# Patient Record
Sex: Female | Born: 1970
Health system: Southern US, Community
[De-identification: ages and names within clinical notes are randomized; demographics above are authoritative.]

## PROBLEM LIST (undated history)

## (undated) DIAGNOSIS — M1990 Unspecified osteoarthritis, unspecified site: Secondary | ICD-10-CM

## (undated) DIAGNOSIS — M2550 Pain in unspecified joint: Secondary | ICD-10-CM

## (undated) DIAGNOSIS — E559 Vitamin D deficiency, unspecified: Secondary | ICD-10-CM

## (undated) DIAGNOSIS — R7303 Prediabetes: Secondary | ICD-10-CM

## (undated) DIAGNOSIS — F419 Anxiety disorder, unspecified: Secondary | ICD-10-CM

## (undated) DIAGNOSIS — N200 Calculus of kidney: Secondary | ICD-10-CM

## (undated) DIAGNOSIS — I10 Essential (primary) hypertension: Secondary | ICD-10-CM

## (undated) DIAGNOSIS — D649 Anemia, unspecified: Secondary | ICD-10-CM

## (undated) DIAGNOSIS — R3129 Other microscopic hematuria: Secondary | ICD-10-CM

## (undated) HISTORY — DX: Anemia, unspecified: D64.9

## (undated) HISTORY — DX: Pain in unspecified joint: M25.50

## (undated) HISTORY — DX: Prediabetes: R73.03

## (undated) HISTORY — DX: Calculus of kidney: N20.0

## (undated) HISTORY — DX: Other microscopic hematuria: R31.29

## (undated) HISTORY — DX: Essential (primary) hypertension: I10

## (undated) HISTORY — PX: NO PAST SURGERIES: SHX2092

## (undated) HISTORY — DX: Vitamin D deficiency, unspecified: E55.9

## (undated) HISTORY — DX: Anxiety disorder, unspecified: F41.9

## (undated) HISTORY — DX: Unspecified osteoarthritis, unspecified site: M19.90

---

## 2005-09-09 ENCOUNTER — Ambulatory Visit: Payer: Self-pay | Admitting: Internal Medicine

## 2005-09-15 ENCOUNTER — Ambulatory Visit: Payer: Self-pay | Admitting: Internal Medicine

## 2005-09-16 ENCOUNTER — Ambulatory Visit: Payer: Self-pay | Admitting: Cardiology

## 2006-10-02 ENCOUNTER — Emergency Department (HOSPITAL_COMMUNITY): Admission: EM | Admit: 2006-10-02 | Discharge: 2006-10-02 | Payer: Self-pay | Admitting: Family Medicine

## 2006-12-18 ENCOUNTER — Inpatient Hospital Stay (HOSPITAL_COMMUNITY): Admission: AD | Admit: 2006-12-18 | Discharge: 2006-12-20 | Payer: Self-pay | Admitting: Obstetrics and Gynecology

## 2007-01-07 ENCOUNTER — Encounter: Admission: RE | Admit: 2007-01-07 | Discharge: 2007-02-04 | Payer: Self-pay | Admitting: Obstetrics and Gynecology

## 2007-02-05 ENCOUNTER — Encounter: Admission: RE | Admit: 2007-02-05 | Discharge: 2007-03-07 | Payer: Self-pay | Admitting: Obstetrics and Gynecology

## 2007-11-14 ENCOUNTER — Ambulatory Visit: Payer: Self-pay | Admitting: Internal Medicine

## 2007-11-14 LAB — CONVERTED CEMR LAB
ALT: 22 units/L (ref 0–35)
AST: 23 units/L (ref 0–37)
Albumin: 3.6 g/dL (ref 3.5–5.2)
Alkaline Phosphatase: 75 units/L (ref 39–117)
BUN: 8 mg/dL (ref 6–23)
Basophils Absolute: 0 10*3/uL (ref 0.0–0.1)
Bilirubin Urine: NEGATIVE
Bilirubin, Direct: 0.1 mg/dL (ref 0.0–0.3)
Calcium: 8.8 mg/dL (ref 8.4–10.5)
Chloride: 106 meq/L (ref 96–112)
Cholesterol: 157 mg/dL (ref 0–200)
Creatinine, Ser: 0.9 mg/dL (ref 0.4–1.2)
Crystals: NEGATIVE
Eosinophils Absolute: 0.1 10*3/uL (ref 0.0–0.6)
GFR calc Af Amer: 92 mL/min
GFR calc non Af Amer: 76 mL/min
Glucose, Bld: 92 mg/dL (ref 70–99)
HCT: 40.7 % (ref 36.0–46.0)
HDL: 43.2 mg/dL (ref >39.0)
Ketones, ur: NEGATIVE mg/dL
LDL Cholesterol: 102 mg/dL — ABNORMAL HIGH (ref 0–99)
Leukocytes, UA: NEGATIVE
Lymphocytes Relative: 32.1 % (ref 12.0–46.0)
MCHC: 34.5 g/dL (ref 30.0–36.0)
MCV: 92.7 fL (ref 78.0–100.0)
Monocytes Absolute: 0.4 10*3/uL (ref 0.2–0.7)
Monocytes Relative: 5.4 % (ref 3.0–11.0)
Neutro Abs: 4.9 10*3/uL (ref 1.4–7.7)
Neutrophils Relative %: 61 % (ref 43.0–77.0)
Platelets: 292 10*3/uL (ref 150–400)
Potassium: 3.6 meq/L (ref 3.5–5.1)
RBC: 4.39 M/uL (ref 3.87–5.11)
RDW: 12.5 % (ref 11.5–14.6)
Specific Gravity, Urine: >=1.03 (ref 1.000–1.03)
Total CHOL/HDL Ratio: 3.6
Triglycerides: 59 mg/dL (ref 0–149)
Urine Glucose: NEGATIVE mg/dL
Urobilinogen, UA: 0.2 (ref 0.0–1.0)
VLDL: 12 mg/dL (ref 0–40)
WBC: 7.9 10*3/uL (ref 4.5–10.5)
pH: 5.5 (ref 5.0–8.0)

## 2007-11-16 ENCOUNTER — Ambulatory Visit: Payer: Self-pay | Admitting: Internal Medicine

## 2009-01-23 ENCOUNTER — Telehealth (INDEPENDENT_AMBULATORY_CARE_PROVIDER_SITE_OTHER): Payer: Self-pay | Admitting: *Deleted

## 2009-01-23 ENCOUNTER — Encounter (INDEPENDENT_AMBULATORY_CARE_PROVIDER_SITE_OTHER): Payer: Self-pay | Admitting: *Deleted

## 2009-02-15 ENCOUNTER — Ambulatory Visit: Payer: Self-pay | Admitting: Internal Medicine

## 2009-02-15 LAB — CONVERTED CEMR LAB
ALT: 28 units/L (ref 0–35)
AST: 22 units/L (ref 0–37)
Albumin: 3.7 g/dL (ref 3.5–5.2)
Alkaline Phosphatase: 64 units/L (ref 39–117)
BUN: 11 mg/dL (ref 6–23)
Basophils Absolute: 0 10*3/uL (ref 0.0–0.1)
Basophils Relative: 0.4 % (ref 0.0–3.0)
Bilirubin Urine: NEGATIVE
Bilirubin, Direct: 0.1 mg/dL (ref 0.0–0.3)
CO2: 24 meq/L (ref 19–32)
Calcium: 9.1 mg/dL (ref 8.4–10.5)
Chloride: 109 meq/L (ref 96–112)
Cholesterol: 186 mg/dL (ref 0–200)
Creatinine, Ser: 0.8 mg/dL (ref 0.4–1.2)
Crystals: NEGATIVE
Eosinophils Absolute: 0.1 10*3/uL (ref 0.0–0.7)
Eosinophils Relative: 1.3 % (ref 0.0–5.0)
GFR calc Af Amer: 104 mL/min
GFR calc non Af Amer: 86 mL/min
Glucose, Bld: 93 mg/dL (ref 70–99)
HCT: 41.8 % (ref 36.0–46.0)
HDL: 54 mg/dL (ref >39.0)
Hemoglobin: 14.2 g/dL (ref 12.0–15.0)
Ketones, ur: NEGATIVE mg/dL
LDL Cholesterol: 119 mg/dL — ABNORMAL HIGH (ref 0–99)
Leukocytes, UA: NEGATIVE
Lymphocytes Relative: 34.1 % (ref 12.0–46.0)
MCHC: 33.9 g/dL (ref 30.0–36.0)
MCV: 94.2 fL (ref 78.0–100.0)
Monocytes Absolute: 0.4 10*3/uL (ref 0.1–1.0)
Monocytes Relative: 5.4 % (ref 3.0–12.0)
Neutro Abs: 4.3 10*3/uL (ref 1.4–7.7)
Neutrophils Relative %: 58.8 % (ref 43.0–77.0)
Nitrite: NEGATIVE
Pap Smear: NORMAL
Platelets: 239 10*3/uL (ref 150–400)
Potassium: 4.1 meq/L (ref 3.5–5.1)
RBC: 4.43 M/uL (ref 3.87–5.11)
RDW: 13.1 % (ref 11.5–14.6)
Sodium: 140 meq/L (ref 135–145)
Specific Gravity, Urine: 1.025 (ref 1.000–1.035)
TSH: 0.71 microintl units/mL (ref 0.35–5.50)
Total Bilirubin: 0.8 mg/dL (ref 0.3–1.2)
Total CHOL/HDL Ratio: 3.4
Total Protein, Urine: NEGATIVE mg/dL
Total Protein: 6.9 g/dL (ref 6.0–8.3)
Triglycerides: 66 mg/dL (ref 0–149)
Urine Glucose: NEGATIVE mg/dL
Urobilinogen, UA: 0.2 (ref 0.0–1.0)
VLDL: 13 mg/dL (ref 0–40)
Vit D, 25-Hydroxy: 13 ng/mL — ABNORMAL LOW (ref 30–89)
WBC: 7.3 10*3/uL (ref 4.5–10.5)
pH: 5.5 (ref 5.0–8.0)

## 2009-02-20 ENCOUNTER — Ambulatory Visit: Payer: Self-pay | Admitting: Internal Medicine

## 2009-02-20 DIAGNOSIS — E559 Vitamin D deficiency, unspecified: Secondary | ICD-10-CM

## 2009-06-11 ENCOUNTER — Telehealth: Payer: Self-pay | Admitting: Internal Medicine

## 2009-06-14 ENCOUNTER — Telehealth: Payer: Self-pay | Admitting: Internal Medicine

## 2009-07-15 ENCOUNTER — Ambulatory Visit: Payer: Self-pay | Admitting: Internal Medicine

## 2009-07-15 LAB — CONVERTED CEMR LAB: Vit D, 25-Hydroxy: 36 ng/mL (ref 30–89)

## 2009-07-16 ENCOUNTER — Telehealth: Payer: Self-pay | Admitting: Internal Medicine

## 2009-09-02 ENCOUNTER — Ambulatory Visit: Payer: Self-pay | Admitting: Internal Medicine

## 2009-09-02 DIAGNOSIS — R3129 Other microscopic hematuria: Secondary | ICD-10-CM

## 2009-09-02 DIAGNOSIS — F4321 Adjustment disorder with depressed mood: Secondary | ICD-10-CM | POA: Insufficient documentation

## 2009-09-02 LAB — CONVERTED CEMR LAB
Albumin ELP: 55.7 % — ABNORMAL LOW (ref 55.8–66.1)
Albumin, U: DETECTED %
Alpha 1, Urine: DETECTED % — AB
Alpha 2, Urine: DETECTED % — AB
Alpha-1-Globulin: 5.5 % — ABNORMAL HIGH (ref 2.9–4.9)
Beta Globulin: 7.1 % (ref 4.7–7.2)
Beta, Urine: DETECTED % — AB
Free Lambda Lt Chains,Ur: 0.33 mg/dL (ref 0.08–1.01)
Gamma Globulin, Urine: DETECTED % — AB
Gamma Globulin: 15 % (ref 11.1–18.8)
Total Protein, Serum Electrophoresis: 7 g/dL (ref 6.0–8.3)

## 2009-09-05 ENCOUNTER — Encounter: Payer: Self-pay | Admitting: Internal Medicine

## 2009-09-05 ENCOUNTER — Telehealth: Payer: Self-pay | Admitting: Internal Medicine

## 2009-09-05 LAB — CONVERTED CEMR LAB
IgA: 214 mg/dL (ref 68–378)
IgG (Immunoglobin G), Serum: 1150 mg/dL (ref 694–1618)
IgM, Serum: 53 mg/dL — ABNORMAL LOW (ref 60–263)
Total Protein, Serum Electrophoresis: 7 g/dL (ref 6.0–8.3)

## 2009-09-09 ENCOUNTER — Telehealth: Payer: Self-pay | Admitting: Internal Medicine

## 2010-08-07 ENCOUNTER — Telehealth: Payer: Self-pay | Admitting: Internal Medicine

## 2010-08-26 ENCOUNTER — Ambulatory Visit: Payer: Self-pay | Admitting: Internal Medicine

## 2010-08-26 LAB — CONVERTED CEMR LAB
BUN: 14 mg/dL (ref 6–23)
Basophils Absolute: 0 10*3/uL (ref 0.0–0.1)
Basophils Relative: 0.5 % (ref 0.0–3.0)
CO2: 25 meq/L (ref 19–32)
Calcium: 9.8 mg/dL (ref 8.4–10.5)
Chloride: 108 meq/L (ref 96–112)
Creatinine, Ser: 0.8 mg/dL (ref 0.4–1.2)
Eosinophils Absolute: 0.2 10*3/uL (ref 0.0–0.7)
Eosinophils Relative: 2.8 % (ref 0.0–5.0)
GFR calc non Af Amer: 97.61 mL/min (ref >60)
Glucose, Bld: 97 mg/dL (ref 70–99)
HCT: 41.1 % (ref 36.0–46.0)
Hemoglobin: 13.8 g/dL (ref 12.0–15.0)
Lymphocytes Relative: 30.9 % (ref 12.0–46.0)
Lymphs Abs: 2.3 10*3/uL (ref 0.7–4.0)
MCHC: 33.7 g/dL (ref 30.0–36.0)
Monocytes Absolute: 0.6 10*3/uL (ref 0.1–1.0)
Monocytes Relative: 8.5 % (ref 3.0–12.0)
Neutro Abs: 4.2 10*3/uL (ref 1.4–7.7)
Neutrophils Relative %: 57.3 % (ref 43.0–77.0)
Platelets: 270 10*3/uL (ref 150.0–400.0)
Potassium: 5.3 meq/L — ABNORMAL HIGH (ref 3.5–5.1)
RBC: 4.31 M/uL (ref 3.87–5.11)
RDW: 13.8 % (ref 11.5–14.6)
Sodium: 139 meq/L (ref 135–145)
TSH: 0.48 microintl units/mL (ref 0.35–5.50)

## 2010-09-04 ENCOUNTER — Ambulatory Visit: Payer: Self-pay | Admitting: Internal Medicine

## 2011-01-06 NOTE — Progress Notes (Signed)
Summary: Needs lab order  Phone Note Call from Patient Call back at [phone] Can leave message on machine   Caller: Patient Call For: D. Thomos Lemons DO Summary of Call: Pt called stating she has CPX scheduled on 08/29/10. Wants lab order sent to [facility]  Please advise what labs to order? Nicki Guadalajara Fergerson CMA Duncan Dull)  August 07, 2010 10:19 AM   Follow-up for Phone Call        CBC TSH BMET  - use v70 Follow-up by: D. Thomos Lemons DO,  August 07, 2010 5:33 PM  Additional Follow-up for Phone Call Additional follow up Details #1::        lab orders have been enetered for 08/20/10 Additional Follow-up by: Glendell Docker CMA,  August 08, 2010 8:22 AM

## 2011-01-06 NOTE — Assessment & Plan Note (Signed)
Summary: CPX/MHF   Vital Signs:  Patient profile:   40 year old female Height:      64 inches Weight:      174.75 pounds BMI:     30.10 O2 Sat:      98 % on Room air Temp:     98.4 degrees F oral Pulse rate:   77 / minute Pulse rhythm:   regular Resp:     16 per minute BP sitting:   100 / 74  (right arm) Cuff size:   large  Vitals Entered By: Glendell Docker CMA (September 04, 2010 8:38 AM)  O2 Flow:  Room air CC: CPX Is Patient Diabetic? No Pain Assessment Patient in pain? no        Primary Care Provider:  Dondra Spry DO  CC:  CPX.  History of Present Illness: 40 y/o AA female for routine cpx no sing int med hx staying busy still finishing nursing school - May, 2015 doesn't always eat healthy  lab results reviwed with pt  Preventive Screening-Counseling & Management  Alcohol-Tobacco     Alcohol drinks/day: 0     Smoking Status: never  Caffeine-Diet-Exercise     Caffeine use/day: 3 beverages daily     Does Patient Exercise: yes  Allergies (verified): No Known Drug Allergies  Past History:  Past Medical History: Microscopic Hematuria      Past Surgical History: Denies surgical history     Family History: Mother died from complications of multiple myeloma Father healthy    Social History: 5 children Never Smoked [facility] Alcohol use-no  Married - Editor, commissioning use/day:  3 beverages daily  Review of Systems  The patient denies weight loss, weight gain, chest pain, syncope, dyspnea on exertion, prolonged cough, abdominal pain, melena, hematochezia, severe indigestion/heartburn, and depression.    Physical Exam  General:  alert, well-developed, and well-nourished.   Head:  normocephalic and atraumatic.   Eyes:  pupils equal, pupils round, and pupils reactive to light.   Ears:  R ear normal and L ear normal.   Mouth:  pharynx pink and moist.   Neck:  supple, full ROM, no masses, and no neck tenderness.     Lungs:  normal respiratory effort and normal breath sounds.   Heart:  normal rate, regular rhythm, no murmur, and no gallop.   Abdomen:  soft, non-tender, and normal bowel sounds.   Extremities:  No lower extremity edema Neurologic:  cranial nerves II-XII intact and gait normal.   Psych:  normally interactive, good eye contact, not anxious appearing, and not depressed appearing.     Impression & Recommendations:  Problem # 1:  ROUTINE GENERAL MEDICAL [email or web address]@HEALTH  CARE FACL (ICD-V70.0) Reviewed adult health maintenance protocols. Pt counseled on diet and exercise.  Pap smear: normal (02/17/2010) Td Booster: Td (12/24/2003)   Chol: 186 (02/15/2009)   HDL: 54.0 (02/15/2009)   LDL: 119 (02/15/2009)   TG: 66 (02/15/2009) TSH: 0.48 (08/26/2010)     Complete Medication List: 1)  Estrostep Fe 1-20/1-30/1-35 Mg-mcg Tabs (Norethindron-ethinyl estrad-fe) .... Take 1 tablet by mouth once a day 2)  Vitamin D3 2000 Unit Caps (Cholecalciferol) .... One by mouth qd  Patient Instructions: 1)  Please schedule a follow-up appointment in 1 year  Current Allergies (reviewed today): No known allergies      Preventive Care Screening  Pap Smear:    Date:  02/17/2010    Results:  normal

## 2011-04-24 NOTE — Assessment & Plan Note (Signed)
[facility]                           PRIMARY CARE OFFICE NOTE   MATSUE, STROM                      MRN:          [id number]  DATE:04/22/2007                            DOB:          12-15-1970    PROCEDURE:  Skin tag removal.   INDICATION:  Three large skin tags on the neck.   Risks and benefits explained to the patient in detail, she agreed to  proceed.   The skin was prepped with dye and alcohol.  Each injected with 4 to 5 cc  of 2% lidocaine with epinephrine.  Removed with a sterile blade.  The  base of skin tags was treated with hyfrecator, tolerated well.  Complications none.  Band-Aid with antibiotic ointment.  Wound  instructions provided.  Performed at no charge.     Georgina Quint. Plotnikov, MD  Electronically Signed    AVP/MedQ  DD: 04/22/2007  DT: 04/22/2007  Job #: [id number]

## 2011-05-11 ENCOUNTER — Telehealth: Payer: Self-pay | Admitting: Internal Medicine

## 2011-05-11 NOTE — Telephone Encounter (Signed)
Opened in error

## 2011-09-08 ENCOUNTER — Telehealth: Payer: Self-pay | Admitting: Internal Medicine

## 2011-09-08 DIAGNOSIS — Z0000 Encounter for general adult medical examination without abnormal findings: Secondary | ICD-10-CM

## 2011-09-08 NOTE — Telephone Encounter (Signed)
Labs entered for the week of October 8 th 2012.

## 2011-09-08 NOTE — Telephone Encounter (Signed)
Patient has cpe appt on 09/23/11. Please order labs for one week prior to visit. Patient will be going to [facility] for labs. Also, patient would like you to call her, to let her know that labs have been ordered and you may leave a detailed message on phone(per patient)

## 2011-09-09 NOTE — Telephone Encounter (Signed)
Call returned to patient at [phone], no answer. A detailed voice message was left informing patient of lab orders for Yvonne [name].

## 2011-09-14 ENCOUNTER — Other Ambulatory Visit (INDEPENDENT_AMBULATORY_CARE_PROVIDER_SITE_OTHER): Payer: Self-pay

## 2011-09-14 DIAGNOSIS — Z0000 Encounter for general adult medical examination without abnormal findings: Secondary | ICD-10-CM

## 2011-09-14 LAB — BASIC METABOLIC PANEL
Calcium: 8.6 mg/dL (ref 8.4–10.5)
Chloride: 109 mEq/L (ref 96–112)
Creatinine, Ser: 0.9 mg/dL (ref 0.4–1.2)
Sodium: 140 mEq/L (ref 135–145)

## 2011-09-14 LAB — CBC WITH DIFFERENTIAL/PLATELET
Basophils Absolute: 0 10*3/uL (ref 0.0–0.1)
Eosinophils Absolute: 0.1 10*3/uL (ref 0.0–0.7)
Hemoglobin: 13.4 g/dL (ref 12.0–15.0)
Lymphocytes Relative: 30.8 % (ref 12.0–46.0)
Lymphs Abs: 2.7 10*3/uL (ref 0.7–4.0)
MCHC: 32.8 g/dL (ref 30.0–36.0)
Neutro Abs: 5.3 10*3/uL (ref 1.4–7.7)
Platelets: 292 10*3/uL (ref 150.0–400.0)
RDW: 14 % (ref 11.5–14.6)

## 2011-09-14 LAB — URINALYSIS, ROUTINE W REFLEX MICROSCOPIC
Bilirubin Urine: NEGATIVE
Ketones, ur: NEGATIVE
Total Protein, Urine: NEGATIVE
Urine Glucose: NEGATIVE

## 2011-09-14 LAB — HEPATIC FUNCTION PANEL
ALT: 55 U/L — ABNORMAL HIGH (ref 0–35)
AST: 27 U/L (ref 0–37)
Bilirubin, Direct: 0.1 mg/dL (ref 0.0–0.3)
Total Bilirubin: 0.6 mg/dL (ref 0.3–1.2)

## 2011-09-14 LAB — HEMOGLOBIN A1C: Hgb A1c MFr Bld: 5.7 % (ref 4.6–6.5)

## 2011-09-14 LAB — LIPID PANEL: Cholesterol: 172 mg/dL (ref 0–200)

## 2011-09-16 ENCOUNTER — Encounter: Payer: Self-pay | Admitting: Internal Medicine

## 2011-09-23 ENCOUNTER — Ambulatory Visit (INDEPENDENT_AMBULATORY_CARE_PROVIDER_SITE_OTHER): Payer: BC Managed Care – PPO | Admitting: Internal Medicine

## 2011-09-23 ENCOUNTER — Telehealth: Payer: Self-pay | Admitting: Internal Medicine

## 2011-09-23 ENCOUNTER — Encounter: Payer: Self-pay | Admitting: Internal Medicine

## 2011-09-23 VITALS — BP 102/70 | HR 93 | Temp 98.4°F | Resp 18 | Ht 64.0 in | Wt 184.0 lb

## 2011-09-23 DIAGNOSIS — Z0000 Encounter for general adult medical examination without abnormal findings: Secondary | ICD-10-CM

## 2011-09-23 DIAGNOSIS — R748 Abnormal levels of other serum enzymes: Secondary | ICD-10-CM

## 2011-09-23 DIAGNOSIS — E559 Vitamin D deficiency, unspecified: Secondary | ICD-10-CM

## 2011-09-23 LAB — HEPATIC FUNCTION PANEL
AST: 13 U/L (ref 0–37)
Albumin: 4.3 g/dL (ref 3.5–5.2)
Alkaline Phosphatase: 67 U/L (ref 39–117)
Bilirubin, Direct: 0.1 mg/dL (ref 0.0–0.3)
Total Bilirubin: 0.3 mg/dL (ref 0.3–1.2)

## 2011-09-23 LAB — VITAMIN D 25 HYDROXY (VIT D DEFICIENCY, FRACTURES): Vit D, 25-Hydroxy: 24 ng/mL — ABNORMAL LOW (ref 30–89)

## 2011-09-23 NOTE — Patient Instructions (Signed)
Please schedule fasting labs before next year's physical: cbc, chem7, a1c, lipid, lft, tsh, and urinalysis with micro (v70.0)

## 2011-09-23 NOTE — Telephone Encounter (Signed)
Lab orders entered for Yvonne [name] for October 2013.

## 2011-09-27 DIAGNOSIS — R748 Abnormal levels of other serum enzymes: Secondary | ICD-10-CM | POA: Insufficient documentation

## 2011-09-27 DIAGNOSIS — Z0000 Encounter for general adult medical examination without abnormal findings: Secondary | ICD-10-CM | POA: Insufficient documentation

## 2011-09-27 NOTE — Progress Notes (Signed)
  Subjective:    Patient ID: Yvonne [name], female    DOB: December 14, 1970, 40 y.o.   MRN: [id number]  HPI Pt presents to clnic for annual exam. Sees gyn for pap smears reported utd and nl. Reviewed cpe labs with single minimal elevation of alt 55. No known liver disease. No excessive etoh or tylenol use. asx without abd pain, n/v. No active complaint.  Past Medical History  Diagnosis Date  . Microscopic hematuria    Past Surgical History  Procedure Date  . No past surgeries     Denies surgical history    reports that she has never smoked. She has never used smokeless tobacco. She reports that she does not drink alcohol or use illicit drugs. family history includes Healthy in her father and Other in her mother. No Known Allergies     Review of Systems see hpi     Objective:   Physical Exam  Nursing note and vitals reviewed. Constitutional: She appears well-developed and well-nourished. No distress.  HENT:  Head: Normocephalic and atraumatic.  Right Ear: Tympanic membrane, external ear and ear canal normal.  Left Ear: Tympanic membrane, external ear and ear canal normal.  Nose: Nose normal.  Mouth/Throat: Oropharynx is clear and moist. No oropharyngeal exudate.  Eyes: Conjunctivae and EOM are normal. Pupils are equal, round, and reactive to light. Right eye exhibits no discharge. Left eye exhibits no discharge. No scleral icterus.  Neck: Neck supple. No JVD present. No thyromegaly present.  Cardiovascular: Normal rate, regular rhythm and normal heart sounds.  Exam reveals no gallop and no friction rub.   No murmur heard. Pulmonary/Chest: Effort normal and breath sounds normal. No respiratory distress. She has no wheezes. She has no rales.  Abdominal: Soft. Bowel sounds are normal. She exhibits no distension and no mass. There is no hepatosplenomegaly. There is no tenderness. There is no rebound and no guarding.  Lymphadenopathy:    She has no cervical adenopathy.  Neurological:  She is alert.  Skin: Skin is warm and dry. No rash noted. She is not diaphoretic. No erythema.  Psychiatric: She has a normal mood and affect.          Assessment & Plan:

## 2011-09-27 NOTE — Assessment & Plan Note (Signed)
Obtain vit d level 

## 2011-09-27 NOTE — Assessment & Plan Note (Signed)
Nl exam. Pap smears per gyn

## 2011-09-27 NOTE — Assessment & Plan Note (Signed)
Repeat lft

## 2011-09-28 ENCOUNTER — Other Ambulatory Visit: Payer: Self-pay | Admitting: Internal Medicine

## 2011-09-28 DIAGNOSIS — E559 Vitamin D deficiency, unspecified: Secondary | ICD-10-CM

## 2011-10-26 ENCOUNTER — Telehealth: Payer: Self-pay | Admitting: Internal Medicine

## 2011-10-26 NOTE — Telephone Encounter (Signed)
Patient states that she wants to go to the [place] lab for Vit-D in mid-December.

## 2011-10-27 NOTE — Telephone Encounter (Signed)
Lab order entered for December 2012. 

## 2011-12-03 ENCOUNTER — Other Ambulatory Visit (INDEPENDENT_AMBULATORY_CARE_PROVIDER_SITE_OTHER): Payer: BC Managed Care – PPO

## 2011-12-03 DIAGNOSIS — E559 Vitamin D deficiency, unspecified: Secondary | ICD-10-CM

## 2011-12-03 DIAGNOSIS — Z0000 Encounter for general adult medical examination without abnormal findings: Secondary | ICD-10-CM

## 2011-12-03 LAB — URINALYSIS, ROUTINE W REFLEX MICROSCOPIC
Bilirubin Urine: NEGATIVE
Total Protein, Urine: 30
Urine Glucose: NEGATIVE
Urobilinogen, UA: 0.2 (ref 0.0–1.0)

## 2012-03-03 ENCOUNTER — Encounter: Payer: Self-pay | Admitting: Internal Medicine

## 2012-03-03 ENCOUNTER — Ambulatory Visit (INDEPENDENT_AMBULATORY_CARE_PROVIDER_SITE_OTHER): Payer: 59 | Admitting: Internal Medicine

## 2012-03-03 ENCOUNTER — Ambulatory Visit (HOSPITAL_BASED_OUTPATIENT_CLINIC_OR_DEPARTMENT_OTHER)
Admission: RE | Admit: 2012-03-03 | Discharge: 2012-03-03 | Disposition: A | Discharge status: Home / Self Care | Payer: 59 | Source: Ambulatory Visit | Attending: Internal Medicine | Admitting: Internal Medicine

## 2012-03-03 VITALS — BP 118/80 | HR 86 | Temp 98.1°F | Resp 20 | Wt 189.0 lb

## 2012-03-03 DIAGNOSIS — R0989 Other specified symptoms and signs involving the circulatory and respiratory systems: Secondary | ICD-10-CM

## 2012-03-03 DIAGNOSIS — R0683 Snoring: Secondary | ICD-10-CM

## 2012-03-03 DIAGNOSIS — M25569 Pain in unspecified knee: Secondary | ICD-10-CM

## 2012-03-03 DIAGNOSIS — R0609 Other forms of dyspnea: Secondary | ICD-10-CM

## 2012-03-03 MED ORDER — DICLOFENAC SODIUM 75 MG PO TBEC: DELAYED_RELEASE_TABLET | ORAL | Status: DC

## 2012-03-03 MED ORDER — DICLOFENAC SODIUM 75 MG PO TBEC: 75.0000 mg | DELAYED_RELEASE_TABLET | Freq: Two times a day (BID) | ORAL | Status: DC

## 2012-03-06 DIAGNOSIS — R0683 Snoring: Secondary | ICD-10-CM | POA: Insufficient documentation

## 2012-03-06 DIAGNOSIS — M25569 Pain in unspecified knee: Secondary | ICD-10-CM | POA: Insufficient documentation

## 2012-03-06 NOTE — Assessment & Plan Note (Signed)
Pulmonary consult

## 2012-03-06 NOTE — Assessment & Plan Note (Signed)
Attempt voltaren with food and no other nsaids. Obtain plain xray. Consider sports medicine consult if fails to improve.

## 2012-03-06 NOTE — Progress Notes (Signed)
  Subjective:    Patient ID: Yvonne [name], female    DOB: [birth year], 41 y.o.   MRN: [id number]  HPI Pt presents to clinic for evaluation of knee pain. Notes two week h/o right knee pain without injury. No instability or falls. Attempted motrin 600mg  bid. Pain worse in the am. No other alleviating or exacerbating factors. Notes snoring, daytime fatigue and husband has witnessed apnea. Has no been evaluated for osa.  Past Medical History  Diagnosis Date  . Microscopic hematuria    Past Surgical History  Procedure Date  . No past surgeries     Denies surgical history    reports that she has never smoked. She has never used smokeless tobacco. She reports that she does not drink alcohol or use illicit drugs. family history includes Healthy in her father and Other in her mother. No Known Allergies   Review of Systems see hpi     Objective:   Physical Exam  Nursing note and vitals reviewed. Constitutional: She appears well-developed and well-nourished. No distress.  HENT:  Head: Normocephalic and atraumatic.  Musculoskeletal:       Right knee-no erythema, warmth, or effusion. FROM. Gait nl. Ant drawers neg.  Neurological: She is alert.  Skin: Skin is warm and dry. She is not diaphoretic.  Psychiatric: She has a normal mood and affect.          Assessment & Plan:

## 2012-04-19 ENCOUNTER — Ambulatory Visit (INDEPENDENT_AMBULATORY_CARE_PROVIDER_SITE_OTHER): Payer: 59 | Admitting: Internal Medicine

## 2012-04-19 ENCOUNTER — Encounter: Payer: Self-pay | Admitting: Internal Medicine

## 2012-04-19 ENCOUNTER — Institutional Professional Consult (permissible substitution): Payer: 59 | Admitting: Internal Medicine

## 2012-04-19 VITALS — BP 120/76 | HR 97 | Ht 64.0 in | Wt 190.2 lb

## 2012-04-19 DIAGNOSIS — R0683 Snoring: Secondary | ICD-10-CM

## 2012-04-19 DIAGNOSIS — J309 Allergic rhinitis, unspecified: Secondary | ICD-10-CM

## 2012-04-19 DIAGNOSIS — J302 Other seasonal allergic rhinitis: Secondary | ICD-10-CM

## 2012-04-19 DIAGNOSIS — R0609 Other forms of dyspnea: Secondary | ICD-10-CM

## 2012-04-19 DIAGNOSIS — G4733 Obstructive sleep apnea (adult) (pediatric): Secondary | ICD-10-CM

## 2012-04-19 NOTE — Patient Instructions (Signed)
Order- Schedule split protocol NPSG   Dx OSA  

## 2012-04-19 NOTE — Progress Notes (Signed)
04/19/12- 40 yoFnever smoker seen on referral by Dr  Rodena Medin  for evaluation of sleep concerns. Husband Minerva Areola is followed for chronic lung disease. She complains of snoring, choking in sleep, unable to sleep for long periods of time and extremely tired during the daytime. Bedtime 9 to 10 PM, sleep latency 10-20 minutes, waking 3 times before up at 5:30 AM. Daytime sleepiness anytime if she is quiet. Naps do help. History of perennial and seasonal allergic rhinitis not helped by OTC allergy meds. No ENT surgery. No history of thyroid or lung disease. 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 Never smoked. Family history negative for sleep apnea. Not aware of reflux or heartburn.  Prior to Admission medications   Medication Sig Start Date End Date Taking? Authorizing Provider  Cholecalciferol (VITAMIN D3) 2000 UNITS TABS Take 2,000 Units by mouth daily.     Yes Historical Provider, MD  diclofenac (VOLTAREN) 75 MG EC tablet One twice a day for 5 days then one twice a day as needed for knee pain (take with food) 03/03/12  Yes Edwyna Perfect, MD  norethindrone-ethinyl estradiol (MICROGESTIN,JUNEL,LOESTRIN) 1-20 MG-MCG tablet Take 1 tablet by mouth daily.   Yes Historical Provider, MD   Past Medical History  Diagnosis Date  . Microscopic hematuria    Past Surgical History  Procedure Date  . No past surgeries     Denies surgical history   Family History  Problem Relation Age of Onset  . Healthy Father   . Other Mother     deceased from multiple myelomas   History   Social History  . Marital Status: Married    Spouse Name: N/A    Number of Children: N/A  . Years of Education: N/A   Occupational History  . Not on file.   Social History Main Topics  . Smoking status: Never Smoker   . Smokeless tobacco: Never Used  . Alcohol Use: No  . Drug Use: No  . Sexually Active: Not on file   Other Topics Concern  . Not on file   Social History Narrative   5 childrenMarried to [employer] [facility]   ROS-see HPI Constitutional:   No-   weight loss, night sweats, fevers, chills, fatigue, lassitude. HEENT:   No-  headaches, difficulty swallowing, tooth/dental problems, sore throat,    + sneezing, itching, ear ache, nasal congestion, post nasal drip,  CV:  No-   chest pain, orthopnea, PND, swelling in lower extremities, anasarca, dizziness, palpitations Resp: No-   shortness of breath with exertion or at rest.              No-   productive cough,  No non-productive cough,  No- coughing up of blood.              No-   change in color of mucus.  No- wheezing.   Skin: No-   rash or lesions. GI:  No-   heartburn, indigestion, abdominal pain, nausea, vomiting, diarrhea,                 change in bowel habits, loss of appetite GU: No-   dysuria, change in color of urine, no urgency or frequency.  No- flank pain. MS:  +joint pain or swelling (knee).  No- decreased range of motion.  No- back pain. Neuro-     nothing unusual Psych:  No- change in mood or affect. No depression or anxiety.  No memory loss.  OBJ- Physical Exam General- Alert,  Oriented, Affect-appropriate, Distress- none acute Skin- rash-none, lesions- none, excoriation- none Lymphadenopathy- none Head- atraumatic            Eyes- Gross vision intact, PERRLA, conjunctivae and secretions clear            Ears- Hearing, canals-normal            Nose- +pale turbinate edema, no-Septal dev, mucus, polyps, erosion, perforation. + "shiners"            Throat- Mallampati III , mucosa clear , drainage- none, tonsils- atrophic Neck- flexible , trachea midline, no stridor , thyroid ? enlarged, carotid no bruit Chest - symmetrical excursion , unlabored           Heart/CV- RRR , no murmur , no gallop  , no rub, nl s1 s2                           - JVD- none , edema- none, stasis changes- none, varices- none           Lung- clear to P&A, wheeze- none, cough- none , dullness-none, rub- none            Chest wall-  Abd- tender-no, distended-no, bowel sounds-present, HSM- no Br/ Gen/ Rectal- Not done, not indicated Extrem- cyanosis- none, clubbing, none, atrophy- none, strength- nl Neuro- grossly intact to observation

## 2012-04-23 DIAGNOSIS — J302 Other seasonal allergic rhinitis: Secondary | ICD-10-CM | POA: Insufficient documentation

## 2012-04-23 NOTE — Assessment & Plan Note (Signed)
Her history could indicate obstructive sleep apnea aggravated by nasal congestion. Some of her description would also fit reflux events with nocturnal choking. She does not recognize reflux or heartburn during the day but we have discussed reflux precautions. Plan-we discussed and will schedule a nocturnal polysomnogram. I think a full monitored study will serve her better.

## 2012-04-23 NOTE — Assessment & Plan Note (Signed)
Spring pollen season is winding down. We will reassess on return, but physical exam now would favor a moderate allergic rhinitis.

## 2012-05-27 ENCOUNTER — Ambulatory Visit (HOSPITAL_BASED_OUTPATIENT_CLINIC_OR_DEPARTMENT_OTHER): Payer: 59 | Attending: Internal Medicine

## 2012-05-27 VITALS — Ht 64.0 in | Wt 190.0 lb

## 2012-05-27 DIAGNOSIS — G4733 Obstructive sleep apnea (adult) (pediatric): Secondary | ICD-10-CM

## 2012-05-27 DIAGNOSIS — G4710 Hypersomnia, unspecified: Secondary | ICD-10-CM | POA: Insufficient documentation

## 2012-06-04 DIAGNOSIS — G4730 Sleep apnea, unspecified: Secondary | ICD-10-CM

## 2012-06-04 DIAGNOSIS — G4710 Hypersomnia, unspecified: Secondary | ICD-10-CM

## 2012-06-04 NOTE — Procedures (Signed)
Yvonne [name], Yvonne [name]               ACCOUNT NO.:  [id number]  MEDICAL RECORD NO.:  [id number]          PATIENT TYPE:  OUT  LOCATION:  SLEEP CENTER                 FACILITY[facility]  [facility]  PHYSICIAN:  Shannell Mikkelsen D. Maple Hudson, MD, FCCP, FACPDATE OF BIRTH:  [birth year]71  DATE OF STUDY:  05/27/2012                           NOCTURNAL POLYSOMNOGRAM  REFERRING PHYSICIAN:  Akaya Proffit D. Victoria Henshaw, MD, FCCP, FACP  INDICATION FOR STUDY:  Hypersomnia with sleep apnea.  EPWORTH SLEEPINESS SCORE:  15/24.  BMI 32.6, weight 190 pounds, height 64 inches,  neck 16 inches.  MEDICATIONS:  Charted and reviewed.  SLEEP ARCHITECTURE:  Total sleep time 313.5 minutes with sleep efficiency 70.4%.  Stage I was 6.5%.  Stage II 76.6%.  Stage III  3.2%. REM 13.7% of total sleep time.  Sleep latency 41.5 minutes, awake REM latency 125.5 minutes, wake after sleep onset 90 minutes, arousal index 13.2.  BEDTIME MEDICATION:  None.  RESPIRATORY DATA:  Apnea-hypopnea index (AHI) 0.8 per hour.  A total of 3 events were scored, all as hypopneas associated with nonsupine sleep position and REM.  REM AHI 4.2 per hour.  There were insufficient numbers of events to permit application of split protocol, CPAP titration on the study night.  OXYGEN DATA:  Loud snoring with oxygen desaturation to a nadir of 90% and mean oxygen saturation through the study of 96.3% on room air.  CARDIAC DATA:  Normal sinus rhythm.  MOVEMENT-PARASOMNIA:  No significant movement disturbance period, bathroom x1.  IMPRESSIONS-RECOMMENDATIONS: 1. Occasional respiratory event with sleep disturbance, within normal     limits.  AHI is 0.8 per hour (the normal range for adults is from 0-     5 events per hour).  Loud snoring with oxygen desaturation to a     nadir of 90% and mean oxygen saturation through the study of 96.3%     on room air. 2. Recognizing loud snoring with little apnea, suggest attention to     upper airway anatomy, rhinitis, tonsil hypertrophy  etc.  Encourage     sleep off flat of back.     Josejuan Hoaglin D. Maple Hudson, MD, [facility], FACP Diplomate, American Board of Sleep Medicine    CDY/MEDQ  D:  06/04/2012 08:35:04  T:  06/04/2012 09:34:10  Job:  [id number]

## 2012-07-01 ENCOUNTER — Ambulatory Visit (INDEPENDENT_AMBULATORY_CARE_PROVIDER_SITE_OTHER): Payer: 59 | Admitting: Internal Medicine

## 2012-07-01 ENCOUNTER — Encounter: Payer: Self-pay | Admitting: Internal Medicine

## 2012-07-01 VITALS — BP 110/84 | HR 86 | Ht 64.0 in | Wt 191.2 lb

## 2012-07-01 DIAGNOSIS — J302 Other seasonal allergic rhinitis: Secondary | ICD-10-CM

## 2012-07-01 DIAGNOSIS — R0989 Other specified symptoms and signs involving the circulatory and respiratory systems: Secondary | ICD-10-CM

## 2012-07-01 DIAGNOSIS — R0683 Snoring: Secondary | ICD-10-CM

## 2012-07-01 DIAGNOSIS — J309 Allergic rhinitis, unspecified: Secondary | ICD-10-CM

## 2012-07-01 NOTE — Progress Notes (Signed)
04/19/12- 40 yoFnever smoker seen on referral by Dr  Rodena Medin  for evaluation of sleep concerns. Husband Yvonne [name] is followed for chronic lung disease. She complains of snoring, choking in sleep, unable to sleep for long periods of time and extremely tired during the daytime. Bedtime 9 to 10 PM, sleep latency 10-20 minutes, waking 3 times before up at 5:30 AM. Daytime sleepiness anytime if she is quiet. Naps do help. History of perennial and seasonal allergic rhinitis not helped by OTC allergy meds. No ENT surgery. No history of thyroid or lung disease. 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 Never smoked. Family history negative for sleep apnea. Not aware of reflux or heartburn.  07/01/12- 40 yoF followed for sleep problems, allergic rhinitis Discuss sleep study results.  No change - still snoring, extremely tired during the day, and waking up at 2 am - not going back to sleep until 4-5 am. NPSG 05/27/12- AHI 0.8/ hr- within normal limits but with loud snoring. We discussed available management for snoring. She says nasal congestion symptoms are doing well now  ROS-see HPI Constitutional:   No-   weight loss, night sweats, fevers, chills, fatigue, lassitude. HEENT:   No-  headaches, difficulty swallowing, tooth/dental problems, sore throat,    + sneezing, itching, ear ache, nasal congestion, post nasal drip,  CV:  No-   chest pain, orthopnea, PND, swelling in lower extremities, anasarca, dizziness, palpitations Resp: No-   shortness of breath with exertion or at rest.              No-   productive cough,  No non-productive cough,  No- coughing up of blood.              No-   change in color of mucus.  No- wheezing.   Skin: No-   rash or lesions. GI:  No-   heartburn, indigestion, abdominal pain, nausea, vomiting,  GU:  MS:  No acute-joint pain or swelling .   Neuro-     nothing unusual Psych:  No- change in mood or affect. No depression or anxiety.  No memory loss.  OBJ- Physical  Exam General- Alert, Oriented, Affect-appropriate, Distress- none acute Skin- rash-none, lesions- none, excoriation- none Lymphadenopathy- none Head- atraumatic            Eyes- Gross vision intact, PERRLA, conjunctivae and secretions clear            Ears- Hearing, canals-normal            Nose- +pale turbinate edema, no-Septal dev, mucus, polyps, erosion, perforation. + "shiners"            Throat- Mallampati III , mucosa clear , drainage- none, tonsils- atrophic, +small mandible Neck- flexible , trachea midline, no stridor , thyroid ? enlarged, carotid no bruit Chest - symmetrical excursion , unlabored           Heart/CV- RRR , no murmur , no gallop  , no rub, nl s1 s2                           - JVD- none , edema- none, stasis changes- none, varices- none           Lung- clear to P&A, wheeze- none, cough- none , dullness-none, rub- none           Chest wall-  Abd-  Br/ Gen/ Rectal- Not done, not indicated Extrem- cyanosis- none, clubbing, none, atrophy- none, strength-  nl Neuro- grossly intact to observation

## 2012-07-01 NOTE — Patient Instructions (Addendum)
You do not have sleep apnea- your score was 0.8/ hour and the normal range is from 0 to 5/ hour. You do snore rather loudly, as we discussed.  You can try otc "boil and bite" mouth pieces from the drug store, elastic chin straps, and Breathe Right nasal strips  Sample Dymista nasal spray- try 2 puffs each nostril, every night at bedtime. See if a few days of doing this makes it easier to breathe through your nose. It may help particularly in allergy season.

## 2012-07-08 NOTE — Assessment & Plan Note (Signed)
Rhinitis is contributing to her snoring. Plan-sample Dymista nasal spray

## 2012-07-08 NOTE — Assessment & Plan Note (Signed)
Loud snoring without sleep apnea. Her allergic rhinitis, Mallampati stage III pharynx and allergic rhinitis all contribute. Plan-discussed treatment for her rhinitis, avoid sleeping off flat of back, tried chin strap, try "boil and  bite" oral appliance, ear plugs for husband.

## 2012-07-22 ENCOUNTER — Telehealth: Payer: Self-pay | Admitting: Internal Medicine

## 2012-07-22 DIAGNOSIS — E559 Vitamin D deficiency, unspecified: Secondary | ICD-10-CM

## 2012-07-22 DIAGNOSIS — Z0000 Encounter for general adult medical examination without abnormal findings: Secondary | ICD-10-CM

## 2012-07-22 NOTE — Telephone Encounter (Signed)
Patient has upcoming cpe appointment for 10/04/12. Please schedule cpe labs. Patient would also like her Vit D checked. She states that she will be going to the [place] lab.

## 2012-07-25 NOTE — Telephone Encounter (Signed)
Received staff msg pt made cpx for Oct needing labs entered in epic... 07/25/12@11 :30am/LMB

## 2012-09-19 ENCOUNTER — Telehealth: Payer: Self-pay | Admitting: Internal Medicine

## 2012-09-19 DIAGNOSIS — Z0000 Encounter for general adult medical examination without abnormal findings: Secondary | ICD-10-CM

## 2012-09-19 NOTE — Telephone Encounter (Signed)
Of course. i am assuming she will be cancelling that 10/29 appt then

## 2012-09-19 NOTE — Telephone Encounter (Signed)
Pt is sch for cpx on 10-07-2012. Pt would like to go to [facility] for cpx lab work. Cindy please put order in system. [facility] lab is convenient for patient work.

## 2012-09-19 NOTE — Telephone Encounter (Signed)
Ok with me 

## 2012-09-19 NOTE — Telephone Encounter (Signed)
Future orders placed 

## 2012-09-19 NOTE — Telephone Encounter (Signed)
Pt would like to switch from Dr Rodena Medin back to Dr Artist Pais. Can I sch?

## 2012-09-21 NOTE — Telephone Encounter (Signed)
Pt is aware future orders in system

## 2012-09-23 ENCOUNTER — Encounter: Payer: BC Managed Care – PPO | Admitting: Internal Medicine

## 2012-09-30 ENCOUNTER — Other Ambulatory Visit (INDEPENDENT_AMBULATORY_CARE_PROVIDER_SITE_OTHER): Payer: 59

## 2012-09-30 DIAGNOSIS — Z0000 Encounter for general adult medical examination without abnormal findings: Secondary | ICD-10-CM

## 2012-09-30 LAB — URINALYSIS, ROUTINE W REFLEX MICROSCOPIC
Ketones, ur: NEGATIVE
Specific Gravity, Urine: >=1.03 (ref 1.000–1.030)
Total Protein, Urine: NEGATIVE
Urine Glucose: NEGATIVE
Urobilinogen, UA: 0.2 (ref 0.0–1.0)

## 2012-09-30 LAB — URINALYSIS
Leukocytes, UA: NEGATIVE
Nitrite: NEGATIVE
Specific Gravity, Urine: >=1.03 (ref 1.000–1.030)
Urobilinogen, UA: 0.2 (ref 0.0–1.0)
pH: 6 (ref 5.0–8.0)

## 2012-09-30 LAB — BASIC METABOLIC PANEL
CO2: 22 mEq/L (ref 19–32)
Calcium: 9 mg/dL (ref 8.4–10.5)
Creatinine, Ser: 0.9 mg/dL (ref 0.4–1.2)
GFR: 92.72 mL/min (ref >60.00)
Glucose, Bld: 83 mg/dL (ref 70–99)

## 2012-09-30 LAB — CBC WITH DIFFERENTIAL/PLATELET
Basophils Absolute: 0 10*3/uL (ref 0.0–0.1)
Eosinophils Relative: 1.7 % (ref 0.0–5.0)
Lymphs Abs: 3.6 10*3/uL (ref 0.7–4.0)
MCHC: 32.8 g/dL (ref 30.0–36.0)
Monocytes Absolute: 0.7 10*3/uL (ref 0.1–1.0)
Neutro Abs: 4.7 10*3/uL (ref 1.4–7.7)
Platelets: 318 10*3/uL (ref 150.0–400.0)
RBC: 4.4 Mil/uL (ref 3.87–5.11)
RDW: 13.9 % (ref 11.5–14.6)
WBC: 9.1 10*3/uL (ref 4.5–10.5)

## 2012-09-30 LAB — LIPID PANEL
Cholesterol: 188 mg/dL (ref 0–200)
LDL Cholesterol: 124 mg/dL — ABNORMAL HIGH (ref 0–99)
Triglycerides: 93 mg/dL (ref 0.0–149.0)
VLDL: 18.6 mg/dL (ref 0.0–40.0)

## 2012-09-30 LAB — TSH: TSH: 0.98 u[IU]/mL (ref 0.35–5.50)

## 2012-09-30 LAB — HEPATIC FUNCTION PANEL
Albumin: 3.5 g/dL (ref 3.5–5.2)
Total Protein: 7.3 g/dL (ref 6.0–8.3)

## 2012-10-04 ENCOUNTER — Encounter: Payer: Self-pay | Admitting: Internal Medicine

## 2012-10-07 ENCOUNTER — Other Ambulatory Visit: Payer: Self-pay | Admitting: Internal Medicine

## 2012-10-07 ENCOUNTER — Ambulatory Visit (INDEPENDENT_AMBULATORY_CARE_PROVIDER_SITE_OTHER): Payer: 59 | Admitting: Internal Medicine

## 2012-10-07 ENCOUNTER — Encounter: Payer: Self-pay | Admitting: Internal Medicine

## 2012-10-07 VITALS — BP 110/80 | HR 68 | Temp 98.7°F | Ht 66.0 in | Wt 193.0 lb

## 2012-10-07 DIAGNOSIS — Z807 Family history of other malignant neoplasms of lymphoid, hematopoietic and related tissues: Secondary | ICD-10-CM

## 2012-10-07 DIAGNOSIS — D229 Melanocytic nevi, unspecified: Secondary | ICD-10-CM | POA: Insufficient documentation

## 2012-10-07 DIAGNOSIS — Z0000 Encounter for general adult medical examination without abnormal findings: Secondary | ICD-10-CM

## 2012-10-07 DIAGNOSIS — D239 Other benign neoplasm of skin, unspecified: Secondary | ICD-10-CM

## 2012-10-07 NOTE — Progress Notes (Signed)
Subjective:    Patient ID: Yvonne [name], female    DOB: 05-Dec-1971, 41 y.o.   MRN: [id number]  HPI  41 year old African American female for routine physical. Patient reports several months ago she was seen for right knee pain. Symptoms which appear to mild osteoarthritis. Patient reports x-ray notable for mild osteoarthritis. She denies strong family history of degenerative joint disease.  She has gained some weight since previous visit. She attributes to drinking sugary beverages.  She is followed by her GYN for routine Pap and pelvic. She is also planning her is her mammogram this year as she turned 41 years old.   Review of Systems  Constitutional: Negative for activity change, appetite change and unexpected weight change.  Eyes: Negative for visual disturbance.  Respiratory: Negative for cough, chest tightness and shortness of breath.   Cardiovascular: Negative for chest pain.  Genitourinary: Negative for difficulty urinating.  Neurological: Negative for headaches.  Gastrointestinal: Negative for abdominal pain, heartburn melena or hematochezia Psych: Negative for depression or anxiety         Past Medical History  Diagnosis Date  . Microscopic hematuria   Kidney stones  History   Social History  . Marital Status: Married    Spouse Name: N/A    Number of Children: N/A  . Years of Education: N/A   Occupational History  . Not on file.   Social History Main Topics  . Smoking status: Never Smoker   . Smokeless tobacco: Never Used  . Alcohol Use: No  . Drug Use: No  . Sexually Active: Not on file   Other Topics Concern  . Not on file   Social History Narrative   5 childrenMarried to [name] [facility]    Past Surgical History  Procedure Date  . No past surgeries     Denies surgical history    Family History  Problem Relation Age of Onset  . Healthy Father   . Other Mother     deceased from multiple myelomas    No Known  Allergies  Current Outpatient Prescriptions on File Prior to Visit  Medication Sig Dispense Refill  . Cholecalciferol (VITAMIN D3) 2000 UNITS TABS Take 2,000 Units by mouth daily.        . diclofenac (VOLTAREN) 75 MG EC tablet One twice a day for 5 days then one twice a day as needed for knee pain (take with food)  30 tablet  0  . norethindrone-ethinyl estradiol (MICROGESTIN,JUNEL,LOESTRIN) 1-20 MG-MCG tablet Take 1 tablet by mouth daily.        BP 110/80  Pulse 68  Temp 98.7 F (37.1 C) (Oral)  Ht 5\' 6"  (1.676 m)  Wt 193 lb (87.544 kg)  BMI 31.15 kg/m2    Objective:   Physical Exam  Constitutional: She is oriented to person, place, and time. She appears well-developed and well-nourished. No distress.  HENT:  Head: Normocephalic and atraumatic.  Right Ear: External ear normal.  Left Ear: External ear normal.  Mouth/Throat: Oropharynx is clear and moist.  Eyes: Conjunctivae normal and EOM are normal. Pupils are equal, round, and reactive to light. No scleral icterus.  Neck: Normal range of motion. Neck supple.  Cardiovascular: Normal rate, regular rhythm and normal heart sounds.   No murmur heard. Pulmonary/Chest: Effort normal and breath sounds normal. She has no wheezes.  Abdominal: Soft. Bowel sounds are normal. She exhibits no mass. There is no tenderness.  Musculoskeletal: She exhibits no edema.  Mild bilateral knee crepitus  Lymphadenopathy:    She has no cervical adenopathy.  Neurological: She is alert and oriented to person, place, and time. No cranial nerve deficit.  Skin: Skin is warm and dry.       2 cm hyperpigmented raised lesion right lower abdomen  Psychiatric: She has a normal mood and affect. Her behavior is normal.          Assessment & Plan:

## 2012-10-07 NOTE — Addendum Note (Signed)
Addended by: Kern Reap B on: 10/07/2012 04:03 PM   Modules accepted: Orders

## 2012-10-07 NOTE — Assessment & Plan Note (Signed)
Reviewed adult health maintenance protocols.  Patient Up-to-date with her Pap and pelvic. She is to followup with her GYN for baseline mammogram. Encouraged weight loss. Screening labs reviewed. No sign of diabetes. Lipid panel within acceptable limits.

## 2012-10-07 NOTE — Assessment & Plan Note (Signed)
Patient has 2 cm raised hyperpigmented skin lesion right lower abdomen. Risks and benefits discussed.  Patient agreed to proceed.  Area prepped with Betadine. 1-1.5 mL of 1% lidocaine with epi used for anesthesia.  Skin lesion removed with dermablade. Minimal bleeding cauterized. After care discussed. Specimen sent to pathology.

## 2012-10-11 ENCOUNTER — Other Ambulatory Visit: Payer: 59

## 2012-10-11 ENCOUNTER — Encounter: Payer: Self-pay | Admitting: Internal Medicine

## 2012-10-11 ENCOUNTER — Other Ambulatory Visit: Payer: Self-pay | Admitting: Internal Medicine

## 2012-10-11 DIAGNOSIS — Z807 Family history of other malignant neoplasms of lymphoid, hematopoietic and related tissues: Secondary | ICD-10-CM

## 2012-10-11 NOTE — Addendum Note (Signed)
Addended by: Alfred Levins D on: 10/11/2012 10:26 AM   Modules accepted: Orders

## 2012-10-12 ENCOUNTER — Other Ambulatory Visit: Payer: 59

## 2012-10-14 LAB — IMMUNOFIXATION ELECTROPHORESIS: IgM, Serum: 50 mg/dL — ABNORMAL LOW (ref 52–322)

## 2013-01-21 ENCOUNTER — Other Ambulatory Visit: Payer: Self-pay

## 2013-03-13 ENCOUNTER — Encounter: Payer: Self-pay | Admitting: Internal Medicine

## 2013-09-04 ENCOUNTER — Telehealth: Payer: Self-pay | Admitting: Internal Medicine

## 2013-09-04 DIAGNOSIS — Z0000 Encounter for general adult medical examination without abnormal findings: Secondary | ICD-10-CM

## 2013-09-04 DIAGNOSIS — Z807 Family history of other malignant neoplasms of lymphoid, hematopoietic and related tissues: Secondary | ICD-10-CM

## 2013-09-04 NOTE — Telephone Encounter (Signed)
In addition to CPX labs, please order SPEP with immunofixation, and UPEP - use family hx of multiple myeloma

## 2013-09-04 NOTE — Telephone Encounter (Signed)
Pt states that her mother passed away of multiple miloma and there is a test to see if she has a trait or something in her system. She states that she has this lab completed every year with her CPX labs. She would like orders for this test, and her CPX labs, to be placed in the computer. 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 Please assist.

## 2013-09-04 NOTE — Telephone Encounter (Signed)
Left message for pt to call back  °

## 2013-09-05 NOTE — Telephone Encounter (Signed)
You can reach pt at [phone]. She will be there til 4:30 pm.

## 2013-09-05 NOTE — Telephone Encounter (Signed)
Left message for pt to call back  °

## 2013-09-05 NOTE — Telephone Encounter (Signed)
Pt wanted to [facility], lab order placed

## 2013-10-03 ENCOUNTER — Other Ambulatory Visit (INDEPENDENT_AMBULATORY_CARE_PROVIDER_SITE_OTHER): Payer: 59

## 2013-10-03 DIAGNOSIS — Z807 Family history of other malignant neoplasms of lymphoid, hematopoietic and related tissues: Secondary | ICD-10-CM

## 2013-10-03 DIAGNOSIS — Z0000 Encounter for general adult medical examination without abnormal findings: Secondary | ICD-10-CM

## 2013-10-03 LAB — URINALYSIS, ROUTINE W REFLEX MICROSCOPIC
Ketones, ur: NEGATIVE
Leukocytes, UA: NEGATIVE
Nitrite: NEGATIVE
Specific Gravity, Urine: 1.025 (ref 1.000–1.030)
pH: 6 (ref 5.0–8.0)

## 2013-10-03 LAB — BASIC METABOLIC PANEL
Calcium: 8.8 mg/dL (ref 8.4–10.5)
Creatinine, Ser: 0.8 mg/dL (ref 0.4–1.2)
GFR: 109.49 mL/min (ref >60.00)
Glucose, Bld: 102 mg/dL — ABNORMAL HIGH (ref 70–99)
Sodium: 138 mEq/L (ref 135–145)

## 2013-10-03 LAB — HEPATIC FUNCTION PANEL
AST: 35 U/L (ref 0–37)
Albumin: 3.6 g/dL (ref 3.5–5.2)
Alkaline Phosphatase: 62 U/L (ref 39–117)
Bilirubin, Direct: 0 mg/dL (ref 0.0–0.3)
Total Bilirubin: 0.5 mg/dL (ref 0.3–1.2)

## 2013-10-03 LAB — CBC WITH DIFFERENTIAL/PLATELET
Basophils Absolute: 0.1 10*3/uL (ref 0.0–0.1)
Basophils Relative: 0.6 % (ref 0.0–3.0)
Eosinophils Relative: 1.6 % (ref 0.0–5.0)
HCT: 40.3 % (ref 36.0–46.0)
Hemoglobin: 13.5 g/dL (ref 12.0–15.0)
Lymphocytes Relative: 35.3 % (ref 12.0–46.0)
Lymphs Abs: 3.1 10*3/uL (ref 0.7–4.0)
MCHC: 33.6 g/dL (ref 30.0–36.0)
MCV: 91.3 fl (ref 78.0–100.0)
Monocytes Absolute: 0.6 10*3/uL (ref 0.1–1.0)
Neutro Abs: 4.8 10*3/uL (ref 1.4–7.7)
Platelets: 328 10*3/uL (ref 150.0–400.0)
RDW: 14 % (ref 11.5–14.6)
WBC: 8.7 10*3/uL (ref 4.5–10.5)

## 2013-10-03 LAB — LIPID PANEL
HDL: 51 mg/dL (ref >39.00)
LDL Cholesterol: 105 mg/dL — ABNORMAL HIGH (ref 0–99)
Total CHOL/HDL Ratio: 3
Triglycerides: 73 mg/dL (ref 0.0–149.0)
VLDL: 14.6 mg/dL (ref 0.0–40.0)

## 2013-10-05 LAB — SPEP & IFE WITH QIG
Albumin ELP: 55.8 % (ref 55.8–66.1)
Alpha-1-Globulin: 4.6 % (ref 2.9–4.9)
Beta 2: 6.7 % — ABNORMAL HIGH (ref 3.2–6.5)
Gamma Globulin: 15.1 % (ref 11.1–18.8)
IgG (Immunoglobin G), Serum: 1100 mg/dL (ref 690–1700)
IgM, Serum: 44 mg/dL — ABNORMAL LOW (ref 52–322)

## 2013-10-05 LAB — PROTEIN ELECTROPHORESIS, URINE REFLEX

## 2013-10-09 ENCOUNTER — Encounter: Payer: 59 | Admitting: Internal Medicine

## 2013-10-12 ENCOUNTER — Other Ambulatory Visit: Payer: Self-pay

## 2013-10-13 ENCOUNTER — Encounter: Payer: Self-pay | Admitting: Internal Medicine

## 2013-10-13 ENCOUNTER — Ambulatory Visit (INDEPENDENT_AMBULATORY_CARE_PROVIDER_SITE_OTHER): Payer: 59 | Admitting: Internal Medicine

## 2013-10-13 VITALS — BP 122/78 | HR 84 | Temp 98.6°F | Ht 66.0 in | Wt 200.0 lb

## 2013-10-13 DIAGNOSIS — Z807 Family history of other malignant neoplasms of lymphoid, hematopoietic and related tissues: Secondary | ICD-10-CM

## 2013-10-13 DIAGNOSIS — Z0000 Encounter for general adult medical examination without abnormal findings: Secondary | ICD-10-CM

## 2013-10-13 NOTE — Assessment & Plan Note (Signed)
Patient's SPEP and UPEP are normal. Screen annually.

## 2013-10-13 NOTE — Patient Instructions (Addendum)
Please complete the following lab tests before your next follow up appointment: CPX labs  A1c - 790.29 SPEP, UPEP - V16.7 Goal weight 150 lbs over next 6-12 months Use [employer]

## 2013-10-13 NOTE — Progress Notes (Signed)
Subjective:    Patient ID: Yvonne [name], female    DOB: Feb 18, 1971, 42 y.o.   MRN: [id number]  HPI  42 year old African American female with history of microscopic hematuria and kidney stones For routine physical. She denies any significant interval medical history. She has gained mild to moderate amount of weight since previous physical. She attributes this to stress from her husband is health issues.  She denies any chest pain but occasionally has dyspnea on exertion. She attributes this to being out of shape. She recently purchased a treadmill and plans to start an exercise program.  She has family history of multiple myeloma (mother). Her lab results reviewed in detail. Her SPEP and UPEP were unremarkable.   Review of Systems  Constitutional: Negative for activity change, appetite change and unexpected weight change.  Eyes: Negative for visual disturbance.  Respiratory: Negative for cough, chest tightness and shortness of breath.  mild dyspnea with climbing stairs Cardiovascular: Negative for chest pain.  Genitourinary: Negative for difficulty urinating.  Neurological: Negative for headaches.  Gastrointestinal: Negative for abdominal pain, heartburn melena or hematochezia Psych: Negative for depression or anxiety Endo:  No polyuria or polydypsia        Past Medical History  Diagnosis Date  . Microscopic hematuria   . Kidney stones     History   Social History  . Marital Status: Married    Spouse Name: N/A    Number of Children: N/A  . Years of Education: N/A   Occupational History  . Not on file.   Social History Main Topics  . Smoking status: Never Smoker   . Smokeless tobacco: Never Used  . Alcohol Use: No  . Drug Use: No  . Sexual Activity: Not on file   Other Topics Concern  . Not on file   Social History Narrative   5 children   Married to [place]   [facility]    Past Surgical History  Procedure Laterality Date  . No  past surgeries      Denies surgical history    Family History  Problem Relation Age of Onset  . Healthy Father   . Other Mother     deceased from multiple myelomas    No Known Allergies  Current Outpatient Prescriptions on File Prior to Visit  Medication Sig Dispense Refill  . Cholecalciferol (VITAMIN D3) 2000 UNITS TABS Take 2,000 Units by mouth daily.        . norethindrone-ethinyl estradiol (MICROGESTIN,JUNEL,LOESTRIN) 1-20 MG-MCG tablet Take 1 tablet by mouth daily.       No current facility-administered medications on file prior to visit.    BP 122/78  Pulse 84  Temp(Src) 98.6 F (37 C) (Oral)  Ht 5\' 6"  (1.676 m)  Wt 200 lb (90.719 kg)  BMI 32.30 kg/m2    Objective:   Physical Exam  Constitutional: She is oriented to person, place, and time. She appears well-developed and well-nourished.  HENT:  Head: Normocephalic and atraumatic.  Eyes: Conjunctivae and EOM are normal. Pupils are equal, round, and reactive to light.  Neck: Neck supple.  No carotid bruit  Cardiovascular: Normal rate, regular rhythm, normal heart sounds and intact distal pulses.   No murmur heard. Pulmonary/Chest: Effort normal and breath sounds normal. She has no wheezes.  Abdominal: Soft. Bowel sounds are normal.  Abdominal obesity  Musculoskeletal: Normal range of motion. She exhibits no edema.  Lymphadenopathy:    She has no cervical adenopathy.  Neurological: She is alert and  oriented to person, place, and time. No cranial nerve deficit.  Skin: Skin is warm and dry.  Psychiatric: She has a normal mood and affect. Her behavior is normal.          Assessment & Plan:

## 2013-10-13 NOTE — Progress Notes (Signed)
Pre-visit discussion using our clinic review tool. No additional management support is needed unless otherwise documented below in the visit note.  

## 2013-10-13 NOTE — Assessment & Plan Note (Signed)
Reviewed adult health maintenance protocols.  Patient has gained significant weight since previous visit. We discuss specific strategies for weight loss. Patient to restrict her calories to 1600 calories per day. Start walking program would interval training.

## 2013-11-11 ENCOUNTER — Encounter: Payer: 59 | Attending: Internal Medicine | Admitting: *Deleted

## 2013-11-11 DIAGNOSIS — Z713 Dietary counseling and surveillance: Secondary | ICD-10-CM | POA: Insufficient documentation

## 2013-11-11 DIAGNOSIS — E669 Obesity, unspecified: Secondary | ICD-10-CM | POA: Insufficient documentation

## 2013-11-11 NOTE — Progress Notes (Signed)
Subjective:     Patient ID: Yvonne [name], female   DOB: [birth year], 42 y.o.   MRN: [id number]  Patient was seen on 11/11/2013 for the Weight Loss Class at the [facility]. The following learning objectives were met by the patient during this class:   Describe healthy choices in each food group  Describe portion size of foods  Use plate method for meal planning  Demonstrate how to read Nutrition Facts food label  Set realistic goals for weight loss, diet changes, and physical activity.     HPI Review of Systems Objective:   Physical Exam Assessment:   Plan:

## 2014-10-05 ENCOUNTER — Other Ambulatory Visit: Payer: 59

## 2014-10-12 ENCOUNTER — Telehealth: Payer: Self-pay | Admitting: Internal Medicine

## 2014-10-12 ENCOUNTER — Other Ambulatory Visit: Payer: 59

## 2014-10-12 ENCOUNTER — Other Ambulatory Visit (INDEPENDENT_AMBULATORY_CARE_PROVIDER_SITE_OTHER): Payer: 59

## 2014-10-12 DIAGNOSIS — R739 Hyperglycemia, unspecified: Secondary | ICD-10-CM

## 2014-10-12 DIAGNOSIS — Z0000 Encounter for general adult medical examination without abnormal findings: Secondary | ICD-10-CM

## 2014-10-12 DIAGNOSIS — Z807 Family history of other malignant neoplasms of lymphoid, hematopoietic and related tissues: Secondary | ICD-10-CM

## 2014-10-12 LAB — URINALYSIS, ROUTINE W REFLEX MICROSCOPIC
Bilirubin Urine: NEGATIVE
Ketones, ur: NEGATIVE
NITRITE: NEGATIVE
Specific Gravity, Urine: 1.02 (ref 1.000–1.030)
TOTAL PROTEIN, URINE-UPE24: NEGATIVE
Urine Glucose: NEGATIVE
Urobilinogen, UA: 0.2 (ref 0.0–1.0)
pH: 6 (ref 5.0–8.0)

## 2014-10-12 LAB — LIPID PANEL
Cholesterol: 173 mg/dL (ref 0–200)
HDL: 48.8 mg/dL (ref >39.00)
LDL Cholesterol: 113 mg/dL — ABNORMAL HIGH (ref 0–99)
NonHDL: 124.2
Total CHOL/HDL Ratio: 4
Triglycerides: 54 mg/dL (ref 0.0–149.0)
VLDL: 10.8 mg/dL (ref 0.0–40.0)

## 2014-10-12 LAB — BASIC METABOLIC PANEL
BUN: 11 mg/dL (ref 6–23)
CHLORIDE: 109 meq/L (ref 96–112)
CO2: 20 mEq/L (ref 19–32)
Calcium: 8.9 mg/dL (ref 8.4–10.5)
Creatinine, Ser: 0.8 mg/dL (ref 0.4–1.2)
GFR: 96.91 mL/min (ref >60.00)
Glucose, Bld: 91 mg/dL (ref 70–99)
Potassium: 3.8 mEq/L (ref 3.5–5.1)
Sodium: 138 mEq/L (ref 135–145)

## 2014-10-12 LAB — CBC WITH DIFFERENTIAL/PLATELET
Basophils Absolute: 0.1 10*3/uL (ref 0.0–0.1)
Basophils Relative: 0.5 % (ref 0.0–3.0)
EOS PCT: 1.6 % (ref 0.0–5.0)
Eosinophils Absolute: 0.2 10*3/uL (ref 0.0–0.7)
HEMATOCRIT: 41.8 % (ref 36.0–46.0)
Hemoglobin: 13.7 g/dL (ref 12.0–15.0)
Lymphocytes Relative: 37.1 % (ref 12.0–46.0)
Lymphs Abs: 4 10*3/uL (ref 0.7–4.0)
MCHC: 32.8 g/dL (ref 30.0–36.0)
MCV: 94.1 fl (ref 78.0–100.0)
MONO ABS: 0.7 10*3/uL (ref 0.1–1.0)
Monocytes Relative: 6.5 % (ref 3.0–12.0)
NEUTROS ABS: 5.8 10*3/uL (ref 1.4–7.7)
Neutrophils Relative %: 54.3 % (ref 43.0–77.0)
Platelets: 354 10*3/uL (ref 150.0–400.0)
RBC: 4.45 Mil/uL (ref 3.87–5.11)
RDW: 14 % (ref 11.5–15.5)
WBC: 10.7 10*3/uL — AB (ref 4.0–10.5)

## 2014-10-12 LAB — HEMOGLOBIN A1C: Hgb A1c MFr Bld: 5.8 % (ref 4.6–6.5)

## 2014-10-12 LAB — HEPATIC FUNCTION PANEL
ALT: 27 U/L (ref 0–35)
AST: 16 U/L (ref 0–37)
Albumin: 3.2 g/dL — ABNORMAL LOW (ref 3.5–5.2)
Alkaline Phosphatase: 66 U/L (ref 39–117)
BILIRUBIN DIRECT: 0.1 mg/dL (ref 0.0–0.3)
Total Bilirubin: 0.4 mg/dL (ref 0.2–1.2)
Total Protein: 7.2 g/dL (ref 6.0–8.3)

## 2014-10-12 LAB — TSH: TSH: 1.5 u[IU]/mL (ref 0.35–4.50)

## 2014-10-12 NOTE — Telephone Encounter (Signed)
Future orders placed, pt aware.  She has a family hx of multiple myeloma and hyperglycemia.

## 2014-10-12 NOTE — Telephone Encounter (Signed)
Pt went to [facility]  and had her labs drawn for her physical, There are no orders in . She is also asking that there be a order for A1C AND MYELOMA TESTING .  She ask Jenny Reichmann if you would call her on her work number.

## 2014-10-16 ENCOUNTER — Telehealth: Payer: Self-pay | Admitting: Internal Medicine

## 2014-10-16 LAB — SPEP & IFE WITH QIG
ALBUMIN ELP: 54.5 % — AB (ref 55.8–66.1)
ALPHA-1-GLOBULIN: 5.3 % — AB (ref 2.9–4.9)
Alpha-2-Globulin: 11.5 % (ref 7.1–11.8)
BETA 2: 5.7 % (ref 3.2–6.5)
Beta Globulin: 7.5 % — ABNORMAL HIGH (ref 4.7–7.2)
Gamma Globulin: 15.5 % (ref 11.1–18.8)
IgA: 194 mg/dL (ref 69–380)
IgG (Immunoglobin G), Serum: 1210 mg/dL (ref 690–1700)
IgM, Serum: 47 mg/dL — ABNORMAL LOW (ref 52–322)
Total Protein, Serum Electrophoresis: 6.5 g/dL (ref 6.0–8.3)

## 2014-10-16 NOTE — Telephone Encounter (Signed)
Pt would like to give you some more info before you speak w/ dr Shawna Orleans. Would like a cb

## 2014-10-18 ENCOUNTER — Encounter: Payer: Self-pay | Admitting: Internal Medicine

## 2014-10-18 ENCOUNTER — Ambulatory Visit (INDEPENDENT_AMBULATORY_CARE_PROVIDER_SITE_OTHER): Payer: 59 | Admitting: Internal Medicine

## 2014-10-18 VITALS — BP 132/82 | HR 80 | Temp 98.9°F | Ht 65.0 in | Wt 206.0 lb

## 2014-10-18 DIAGNOSIS — N390 Urinary tract infection, site not specified: Secondary | ICD-10-CM

## 2014-10-18 DIAGNOSIS — Z23 Encounter for immunization: Secondary | ICD-10-CM

## 2014-10-18 DIAGNOSIS — R319 Hematuria, unspecified: Secondary | ICD-10-CM

## 2014-10-18 DIAGNOSIS — Z0000 Encounter for general adult medical examination without abnormal findings: Secondary | ICD-10-CM

## 2014-10-18 MED ORDER — CEFUROXIME AXETIL 500 MG PO TABS: 500.0000 mg | ORAL_TABLET | Freq: Two times a day (BID) | ORAL | Status: DC

## 2014-10-18 NOTE — Assessment & Plan Note (Signed)
Reviewed adult health maintenance protocols.  Patient's mother died of multiple myeloma.  Patient's history discussed with oncologist-Dr. Marin Olp. He recommends yearly screening with SPEP with immunofixation. She denies any symptoms of peripheral neuropathy. Her renal function is normal and she is not anemic.  Patient counseled on weight loss and low carb diet. She has abnormal glucose. Regular exercise encouraged.  Blood test results reviewed in detail with patient.

## 2014-10-18 NOTE — Progress Notes (Signed)
   Subjective:    Patient ID: Yvonne [name], female    DOB: 1971-08-28, 43 y.o.   MRN: [id number]  HPI  43 year old [place] American female with family history of multiple myeloma for routine physical. Patient denies any significant interval medical history. Her recent blood work was notable for abnormal urinalysis. She denies any dysuria but has noticed strong odor to her urine and increase in urinary frequency and nocturia.  Review of Systems  Constitutional: Negative for activity change, appetite change, mild weight gain Eyes: Negative for visual disturbance.  Respiratory: Negative for cough, chest tightness and shortness of breath.   Cardiovascular: Negative for chest pain.  Genitourinary: Negative for difficulty urinating.  Neurological: Negative for headaches.  Gastrointestinal: Negative for abdominal pain, heartburn melena or hematochezia Psych: Negative for depression or anxiety Endo:  No polyuria or polydypsia        Past Medical History  Diagnosis Date  . Microscopic hematuria   . Kidney stones     History   Social History  . Marital Status: Married    Spouse Name: N/A    Number of Children: N/A  . Years of Education: N/A   Occupational History  . Not on file.   Social History Main Topics  . Smoking status: Never Smoker   . Smokeless tobacco: Never Used  . Alcohol Use: No  . Drug Use: No  . Sexual Activity: Not on file   Other Topics Concern  . Not on file   Social History Narrative   5 children   Married to [name]   [facility]    Past Surgical History  Procedure Laterality Date  . No past surgeries      Denies surgical history    Family History  Problem Relation Age of Onset  . Healthy Father   . Other Mother     deceased from multiple myelomas    No Known Allergies  Current Outpatient Prescriptions on File Prior to Visit  Medication Sig Dispense Refill  . Cholecalciferol (VITAMIN D3) 2000 UNITS TABS Take  2,000 Units by mouth daily.      . norethindrone-ethinyl estradiol (MICROGESTIN,JUNEL,LOESTRIN) 1-20 MG-MCG tablet Take 1 tablet by mouth daily.     No current facility-administered medications on file prior to visit.    BP 132/82 mmHg  Pulse 80  Temp(Src) 98.9 F (37.2 C) (Oral)  Ht $R'5\' 5"'ia$  (1.651 m)  Wt 206 lb (93.441 kg)  BMI 34.28 kg/m2    Objective:   Physical Exam  Constitutional: Appears well-developed and well-nourished. No distress.  Head: Normocephalic and atraumatic.  Ear:  Right and left ear normal.  TMs clear.  Hearing is grossly normal Mouth/Throat: Oropharynx is clear and moist.  Eyes: Conjunctivae are normal. Pupils are equal, round, and reactive to light.  Neck: Normal range of motion. Neck supple. No thyromegaly present. No carotid bruit Cardiovascular: Normal rate, regular rhythm and normal heart sounds.  Exam reveals no gallop and no friction rub.  No murmur heard. Pulmonary/Chest: Effort normal and breath sounds normal.  No wheezes. No rales.  Abdominal: Soft. Bowel sounds are normal. No mass. There is no tenderness.  Neurological: Alert. No cranial nerve deficit.  Skin: Skin is warm and dry.  Psychiatric: Normal mood and affect. Behavior is normal.         Assessment & Plan:

## 2014-10-18 NOTE — Patient Instructions (Signed)
Please complete the following lab tests before your next follow up appointment: CPX labs including A1c - abnormal glucose SPEP with immunofixation - family hx of multiple myeloma 

## 2014-10-18 NOTE — Progress Notes (Signed)
Pre visit review using our clinic review tool, if applicable. No additional management support is needed unless otherwise documented below in the visit note. 

## 2014-10-18 NOTE — Telephone Encounter (Signed)
Pt was seen today.  All questions were answered

## 2014-10-19 ENCOUNTER — Encounter: Payer: 59 | Admitting: Internal Medicine

## 2014-10-20 LAB — URINE CULTURE

## 2014-10-23 ENCOUNTER — Telehealth: Payer: Self-pay | Admitting: Internal Medicine

## 2014-10-23 MED ORDER — FLUCONAZOLE 150 MG PO TABS: 150.0000 mg | ORAL_TABLET | Freq: Once | ORAL | Status: DC

## 2014-10-23 NOTE — Telephone Encounter (Signed)
rx for diflucan called in to her pharmacy

## 2014-10-23 NOTE — Telephone Encounter (Signed)
Pt aware.

## 2014-10-23 NOTE — Telephone Encounter (Signed)
She has finished her antibiotic and it has given her a yeast infection. She would like a rx for diflucan called in please.

## 2015-02-20 LAB — HM PAP SMEAR: HM Pap smear: NORMAL

## 2015-03-01 LAB — HM MAMMOGRAPHY: HM Mammogram: NORMAL

## 2015-03-09 ENCOUNTER — Encounter: Payer: 59 | Attending: Internal Medicine | Admitting: Dietician

## 2015-03-09 DIAGNOSIS — Z713 Dietary counseling and surveillance: Secondary | ICD-10-CM | POA: Insufficient documentation

## 2015-03-09 NOTE — Progress Notes (Signed)
Patient was seen on 03/09/15 for the Weight Loss Class at the [facility]. The following learning objectives were met by the patient during this class:   Describe healthy choices in each food group  Describe portion size of foods  Use plate method for meal planning  Demonstrate how to read Nutrition Facts food label  Set realistic goals for weight loss, diet changes, and physical activity.   Goals:  1. Make healthy food choices in each food group.  2. Reduce portion size of foods.  3. Increase fruit and vegetable intake.  4. Use plate method for meal planning.  5. Increase physical activity.    Handouts given:   1. Nutrition Strategies for Weight Loss   2. Meal plan/portion card   3. MyPlate Planner   4. Weight Management Recipe Resources   5. [employer], [employer], [place]

## 2015-06-21 ENCOUNTER — Telehealth: Payer: Self-pay | Admitting: *Deleted

## 2015-06-21 NOTE — Telephone Encounter (Signed)
Unable to reach patient at time of Pre-Visit Call.  Left message for patient to return call when available.    

## 2015-06-24 ENCOUNTER — Ambulatory Visit (INDEPENDENT_AMBULATORY_CARE_PROVIDER_SITE_OTHER): Payer: 59 | Admitting: Family

## 2015-06-24 ENCOUNTER — Encounter: Payer: Self-pay | Admitting: *Deleted

## 2015-06-24 ENCOUNTER — Encounter: Payer: Self-pay | Admitting: Family

## 2015-06-24 VITALS — BP 100/70 | HR 102 | Temp 98.2°F | Resp 16 | Ht 65.0 in | Wt 209.2 lb

## 2015-06-24 DIAGNOSIS — R030 Elevated blood-pressure reading, without diagnosis of hypertension: Secondary | ICD-10-CM | POA: Diagnosis not present

## 2015-06-24 NOTE — Telephone Encounter (Signed)
Pre-Visit Call completed with patient and chart updated.   Pre-Visit Info documented in Specialty Comments under [employer].    

## 2015-06-24 NOTE — Progress Notes (Signed)
Pre visit review using our clinic review tool, if applicable. No additional management support is needed unless otherwise documented below in the visit note. 

## 2015-06-24 NOTE — Progress Notes (Signed)
   Subjective:    Patient ID: Yvonne [name], female    DOB: [birth year], 44 y.o.   MRN: [id number]  HPI  Yvonne [name] is a 44 yr old female who presents today for follow up. She was seen by another provider at our [place] office previously- last visit was 11/15  Reports that in March she had elevated BP.  She saw Dr. Garwin Brothers who told her to lose weight.  Reports BP 130/95 in May at her eye exam.  Had dental apt at the dentist and had high BP.  She reports that she is watching her sodium but still trying to lose weight.   BP Readings from Last 3 Encounters:  06/24/15 100/70  10/18/14 132/82  10/13/13 122/78   Lab Results  Component Value Date   HGBA1C 5.8 10/12/2014   Wt Readings from Last 3 Encounters:  06/24/15 209 lb 3.2 oz (94.892 kg)  10/18/14 206 lb (93.441 kg)  10/13/13 200 lb (90.719 kg)    Review of Systems See HPI    Past Medical History  Diagnosis Date  . Microscopic hematuria   . Kidney stones     History   Social History  . Marital Status: Married    Spouse Name: N/A  . Number of Children: N/A  . Years of Education: N/A   Occupational History  . Not on file.   Social History Main Topics  . Smoking status: Never Smoker   . Smokeless tobacco: Never Used  . Alcohol Use: No  . Drug Use: No  . Sexual Activity: Not on file   Other Topics Concern  . Not on file   Social History Narrative   5 children   Married to Yvonne [name]   [facility]       Past Surgical History  Procedure Laterality Date  . No past surgeries      Denies surgical history    Family History  Problem Relation Age of Onset  . Healthy Father   . Other Mother     deceased from multiple myelomas    No Known Allergies  Current Outpatient Prescriptions on File Prior to Visit  Medication Sig Dispense Refill  . Cholecalciferol (VITAMIN D3) 2000 UNITS TABS Take 2,000 Units by mouth daily.      . norethindrone-ethinyl estradiol (MICROGESTIN,JUNEL,LOESTRIN)  1-20 MG-MCG tablet Take 1 tablet by mouth daily.     No current facility-administered medications on file prior to visit.    BP 100/70 mmHg  Pulse 102  Temp(Src) 98.2 F (36.8 C) (Oral)  Resp 16  Ht 5\' 5"  (1.651 m)  Wt 209 lb 3.2 oz (94.892 kg)  BMI 34.81 kg/m2  SpO2 98%    Objective:   Physical Exam  Constitutional: She appears well-developed and well-nourished.  Cardiovascular: Normal rate, regular rhythm and normal heart sounds.   No murmur heard. Pulmonary/Chest: Effort normal and breath sounds normal. No respiratory distress. She has no wheezes.  Psychiatric: She has a normal mood and affect. Her behavior is normal. Judgment and thought content normal.          Assessment & Plan:

## 2015-06-24 NOTE — Patient Instructions (Addendum)
Please check your blood pressure 2 x a week. Call if BP> 150/90. Work on [employer], regular exercise and weight loss.  (1-2 pounds a week). Follow up in 3 monthly.

## 2015-06-24 NOTE — Addendum Note (Signed)
Addended by: Leticia Penna A on: 06/24/2015 10:16 AM   Modules accepted: Medications

## 2015-06-30 DIAGNOSIS — I10 Essential (primary) hypertension: Secondary | ICD-10-CM | POA: Insufficient documentation

## 2015-06-30 NOTE — Assessment & Plan Note (Signed)
BP looks great today. Advised pt:  Please check your blood pressure 2 x a week. Call if BP> 150/90. Work on [employer], regular exercise and weight loss.  (1-2 pounds a week).

## 2015-09-30 ENCOUNTER — Ambulatory Visit: Payer: 59 | Admitting: Family

## 2015-12-11 MED FILL — JUNEL 1-20 TABLET: 1-20 | 84 days supply | Qty: 84 | Fill #0

## 2016-02-10 ENCOUNTER — Ambulatory Visit: Payer: 59 | Admitting: Family

## 2016-02-19 MED FILL — NORETHIND-ETH ESTRAD 1-0.02: 1-20 | 84 days supply | Qty: 84 | Fill #1

## 2016-03-27 ENCOUNTER — Telehealth: Payer: Self-pay | Admitting: Behavioral Health

## 2016-03-27 NOTE — Telephone Encounter (Signed)
Unable to reach patient at time of Pre-Visit Call.  Left message for patient to return call when available.    

## 2016-03-30 ENCOUNTER — Ambulatory Visit (INDEPENDENT_AMBULATORY_CARE_PROVIDER_SITE_OTHER): Payer: BLUE CROSS/BLUE SHIELD | Admitting: Family

## 2016-03-30 ENCOUNTER — Encounter: Payer: Self-pay | Admitting: Family

## 2016-03-30 VITALS — BP 110/86 | HR 88 | Temp 98.6°F | Resp 16 | Ht 65.0 in | Wt 201.6 lb

## 2016-03-30 DIAGNOSIS — I517 Cardiomegaly: Secondary | ICD-10-CM

## 2016-03-30 DIAGNOSIS — Z807 Family history of other malignant neoplasms of lymphoid, hematopoietic and related tissues: Secondary | ICD-10-CM | POA: Diagnosis not present

## 2016-03-30 DIAGNOSIS — R6889 Other general symptoms and signs: Secondary | ICD-10-CM | POA: Diagnosis not present

## 2016-03-30 DIAGNOSIS — Z0001 Encounter for general adult medical examination with abnormal findings: Secondary | ICD-10-CM

## 2016-03-30 DIAGNOSIS — Z0000 Encounter for general adult medical examination without abnormal findings: Secondary | ICD-10-CM

## 2016-03-30 LAB — URINALYSIS, ROUTINE W REFLEX MICROSCOPIC
BILIRUBIN URINE: NEGATIVE
LEUKOCYTES UA: NEGATIVE
NITRITE: NEGATIVE
Specific Gravity, Urine: 1.025 (ref 1.000–1.030)
Urine Glucose: NEGATIVE
Urobilinogen, UA: 0.2 (ref 0.0–1.0)
pH: 5.5 (ref 5.0–8.0)

## 2016-03-30 LAB — CBC WITH DIFFERENTIAL/PLATELET
BASOS ABS: 0.1 10*3/uL (ref 0.0–0.1)
Basophils Relative: 0.5 % (ref 0.0–3.0)
EOS PCT: 1.2 % (ref 0.0–5.0)
Eosinophils Absolute: 0.1 10*3/uL (ref 0.0–0.7)
HEMATOCRIT: 43.8 % (ref 36.0–46.0)
Hemoglobin: 14.3 g/dL (ref 12.0–15.0)
LYMPHS ABS: 3.3 10*3/uL (ref 0.7–4.0)
LYMPHS PCT: 26.8 % (ref 12.0–46.0)
MCHC: 32.8 g/dL (ref 30.0–36.0)
MCV: 92.8 fl (ref 78.0–100.0)
MONOS PCT: 5.1 % (ref 3.0–12.0)
Monocytes Absolute: 0.6 10*3/uL (ref 0.1–1.0)
NEUTROS ABS: 8.1 10*3/uL — AB (ref 1.4–7.7)
NEUTROS PCT: 66.4 % (ref 43.0–77.0)
Platelets: 343 10*3/uL (ref 150.0–400.0)
RBC: 4.72 Mil/uL (ref 3.87–5.11)
RDW: 14.6 % (ref 11.5–15.5)
WBC: 12.2 10*3/uL — ABNORMAL HIGH (ref 4.0–10.5)

## 2016-03-30 LAB — HEPATIC FUNCTION PANEL
ALBUMIN: 4.1 g/dL (ref 3.5–5.2)
ALK PHOS: 65 U/L (ref 39–117)
ALT: 26 U/L (ref 0–35)
AST: 19 U/L (ref 0–37)
Bilirubin, Direct: 0 mg/dL (ref 0.0–0.3)
TOTAL PROTEIN: 7.5 g/dL (ref 6.0–8.3)
Total Bilirubin: 0.4 mg/dL (ref 0.2–1.2)

## 2016-03-30 LAB — LIPID PANEL
CHOLESTEROL: 201 mg/dL — AB (ref 0–200)
HDL: 59 mg/dL (ref >39.00)
LDL CALC: 119 mg/dL — AB (ref 0–99)
NONHDL: 141.81
TRIGLYCERIDES: 116 mg/dL (ref 0.0–149.0)
Total CHOL/HDL Ratio: 3
VLDL: 23.2 mg/dL (ref 0.0–40.0)

## 2016-03-30 LAB — BASIC METABOLIC PANEL
BUN: 15 mg/dL (ref 6–23)
CO2: 24 meq/L (ref 19–32)
Calcium: 9.4 mg/dL (ref 8.4–10.5)
Chloride: 105 mEq/L (ref 96–112)
Creatinine, Ser: 0.9 mg/dL (ref 0.40–1.20)
GFR: 87.66 mL/min (ref >60.00)
GLUCOSE: 86 mg/dL (ref 70–99)
POTASSIUM: 4 meq/L (ref 3.5–5.1)
SODIUM: 138 meq/L (ref 135–145)

## 2016-03-30 LAB — TSH: TSH: 0.83 u[IU]/mL (ref 0.35–4.50)

## 2016-03-30 LAB — HIV ANTIBODY (ROUTINE TESTING W REFLEX): HIV: NONREACTIVE

## 2016-03-30 NOTE — Assessment & Plan Note (Signed)
Discussed healthy diet, exercise, weight loss.  

## 2016-03-30 NOTE — Addendum Note (Signed)
Addended by: Caffie Pinto on: 03/30/2016 08:54 AM   Modules accepted: Orders

## 2016-03-30 NOTE — Addendum Note (Signed)
Addended by: Caffie Pinto on: 03/30/2016 08:38 AM   Modules accepted: Orders

## 2016-03-30 NOTE — Assessment & Plan Note (Signed)
Pt requests SPEP.

## 2016-03-30 NOTE — Progress Notes (Signed)
Subjective:    Patient ID: Yvonne [name], female    DOB: Oct 24, 1971, 45 y.o.   MRN: [id number]  HPI  Patient presents today for complete physical.  Immunizations: tetanus up to date.  Diet: fair, trying to eat healthy Wt Readings from Last 3 Encounters:  03/30/16 210 lb 9.6 oz (95.528 kg)  06/24/15 209 lb 3.2 oz (94.892 kg)  10/18/14 206 lb (93.441 kg)  Exercise: walks daily for 30 minutes Colonoscopy: will begin at 51 Pap Smear: up to date (sees Dr. Garwin Brothers) Mammogram: up to date Dental: up to date Vision: up to date    Review of Systems  Constitutional: Negative for unexpected weight change.  HENT: Negative for hearing loss and rhinorrhea.   Eyes: Negative for visual disturbance.  Respiratory: Negative for cough.   Cardiovascular: Negative for leg swelling.  Gastrointestinal: Negative for diarrhea, constipation and blood in stool.  Genitourinary: Negative for dysuria and frequency.  Musculoskeletal: Negative for myalgias and arthralgias.  Skin: Negative for rash.  Neurological: Negative for headaches.  Hematological: Negative for adenopathy.  Psychiatric/Behavioral:       Denies depression/anxiety   Past Medical History  Diagnosis Date  . Microscopic hematuria   . Kidney stones      Social History   Social History  . Marital Status: Married    Spouse Name: N/A  . Number of Children: N/A  . Years of Education: N/A   Occupational History  . Not on file.   Social History Main Topics  . Smoking status: Never Smoker   . Smokeless tobacco: Never Used  . Alcohol Use: No  . Drug Use: No  . Sexual Activity: Not on file   Other Topics Concern  . Not on file   Social History Narrative   5 children   Married to [name]   [place] [facility]       Past Surgical History  Procedure Laterality Date  . No past surgeries      Denies surgical history    Family History  Problem Relation Age of Onset  . Healthy Father   . Congestive  Heart Failure Father   . Other Mother     deceased from multiple myelomas    No Known Allergies  Current Outpatient Prescriptions on File Prior to Visit  Medication Sig Dispense Refill  . Cholecalciferol (VITAMIN D3) 2000 UNITS TABS Take 2,000 Units by mouth daily.      . norethindrone-ethinyl estradiol (MICROGESTIN,JUNEL,LOESTRIN) 1-20 MG-MCG tablet Take 1 tablet by mouth daily.     No current facility-administered medications on file prior to visit.    BP 110/86 mmHg  Pulse 88  Temp(Src) 98.6 F (37 C) (Oral)  Resp 16  Ht 5\' 5"  (1.651 m)  Wt 201 lb 9.6 oz (91.445 kg)  BMI 33.55 kg/m2  SpO2 99%       Objective:   Physical Exam  Physical Exam  Constitutional: She is oriented to person, place, and time. She appears well-developed and well-nourished. No distress.  HENT:  Head: Normocephalic and atraumatic.  Right Ear: Tympanic membrane and ear canal normal.  Left Ear: Tympanic membrane and ear canal normal.  Mouth/Throat: Oropharynx is clear and moist.  Eyes: Pupils are equal, round, and reactive to light. No scleral icterus.  Neck: Normal range of motion. No thyromegaly present.  Cardiovascular: Normal rate and regular rhythm.   No murmur heard. Pulmonary/Chest: Effort normal and breath sounds normal. No respiratory distress. He has no wheezes. She  has no rales. She exhibits no tenderness.  Abdominal: Soft. Bowel sounds are normal. He exhibits no distension and no mass. There is no tenderness. There is no rebound and no guarding.  Musculoskeletal: She exhibits no edema.  Lymphadenopathy:    She has no cervical adenopathy.  Neurological: She is alert and oriented to person, place, and time. She has normal patellar reflexes. She exhibits normal muscle tone. Coordination normal.  Skin: Skin is warm and dry.  Psychiatric: She has a normal mood and affect. Her behavior is normal. Judgment and thought content normal.  Breasts: Examined lying Right: Without masses,  retractions, discharge or axillary adenopathy.  Left: Without masses, retractions, discharge or axillary adenopathy.          Assessment & Plan:         Assessment & Plan:  LVH- EKG is personally reviewed and notes LVH. Will obtain 2D echo to further evaluate

## 2016-03-30 NOTE — Patient Instructions (Signed)
Please complete lab work prior to leaving. Keep up the good work with healthy diet, exercise and weight loss efforts.  Follow up in 1 year, sooner if problems/concerns.

## 2016-03-30 NOTE — Progress Notes (Signed)
Pre visit review using our clinic review tool, if applicable. No additional management support is needed unless otherwise documented below in the visit note. 

## 2016-04-02 LAB — PROTEIN ELECTROPHORESIS, SERUM, WITH REFLEX
ALBUMIN ELP: 4.1 g/dL (ref 3.8–4.8)
Alpha-1-Globulin: 0.4 g/dL — ABNORMAL HIGH (ref 0.2–0.3)
Alpha-2-Globulin: 0.9 g/dL (ref 0.5–0.9)
BETA 2: 0.4 g/dL (ref 0.2–0.5)
Beta Globulin: 0.6 g/dL (ref 0.4–0.6)
Gamma Globulin: 1.1 g/dL (ref 0.8–1.7)
TOTAL PROTEIN, SERUM ELECTROPHOR: 7.3 g/dL (ref 6.1–8.1)

## 2016-04-02 LAB — IFE INTERPRETATION

## 2016-04-09 ENCOUNTER — Encounter: Payer: Self-pay | Admitting: Family

## 2016-04-09 DIAGNOSIS — D72829 Elevated white blood cell count, unspecified: Secondary | ICD-10-CM

## 2016-04-09 DIAGNOSIS — R809 Proteinuria, unspecified: Secondary | ICD-10-CM

## 2016-04-22 ENCOUNTER — Ambulatory Visit (HOSPITAL_BASED_OUTPATIENT_CLINIC_OR_DEPARTMENT_OTHER)
Admission: RE | Admit: 2016-04-22 | Discharge: 2016-04-22 | Disposition: A | Discharge status: Home / Self Care | Payer: BLUE CROSS/BLUE SHIELD | Source: Ambulatory Visit | Attending: Family | Admitting: Family

## 2016-04-22 ENCOUNTER — Other Ambulatory Visit (INDEPENDENT_AMBULATORY_CARE_PROVIDER_SITE_OTHER): Payer: BLUE CROSS/BLUE SHIELD

## 2016-04-22 DIAGNOSIS — I517 Cardiomegaly: Secondary | ICD-10-CM | POA: Diagnosis present

## 2016-04-22 DIAGNOSIS — D72829 Elevated white blood cell count, unspecified: Secondary | ICD-10-CM

## 2016-04-22 LAB — CBC WITH DIFFERENTIAL/PLATELET
Basophils Absolute: 0 10*3/uL (ref 0.0–0.1)
Basophils Relative: 0.4 % (ref 0.0–3.0)
EOS ABS: 0.1 10*3/uL (ref 0.0–0.7)
EOS PCT: 1.2 % (ref 0.0–5.0)
HCT: 40.4 % (ref 36.0–46.0)
HEMOGLOBIN: 13.6 g/dL (ref 12.0–15.0)
LYMPHS ABS: 3.1 10*3/uL (ref 0.7–4.0)
Lymphocytes Relative: 33.3 % (ref 12.0–46.0)
MCHC: 33.6 g/dL (ref 30.0–36.0)
MCV: 91.7 fl (ref 78.0–100.0)
MONO ABS: 0.6 10*3/uL (ref 0.1–1.0)
Monocytes Relative: 6.5 % (ref 3.0–12.0)
NEUTROS PCT: 58.6 % (ref 43.0–77.0)
Neutro Abs: 5.5 10*3/uL (ref 1.4–7.7)
Platelets: 333 10*3/uL (ref 150.0–400.0)
RBC: 4.41 Mil/uL (ref 3.87–5.11)
RDW: 14.4 % (ref 11.5–15.5)
WBC: 9.4 10*3/uL (ref 4.0–10.5)

## 2016-04-22 LAB — URINALYSIS, ROUTINE W REFLEX MICROSCOPIC
BILIRUBIN URINE: NEGATIVE
Ketones, ur: NEGATIVE
NITRITE: NEGATIVE
Specific Gravity, Urine: 1.02 (ref 1.000–1.030)
TOTAL PROTEIN, URINE-UPE24: NEGATIVE
Urine Glucose: NEGATIVE
Urobilinogen, UA: 0.2 (ref 0.0–1.0)
pH: 6 (ref 5.0–8.0)

## 2016-04-22 NOTE — Progress Notes (Signed)
  Echocardiogram 2D Echocardiogram has been performed.  Jennette Dubin 04/22/2016, 9:12 AM

## 2016-04-23 ENCOUNTER — Other Ambulatory Visit: Payer: BLUE CROSS/BLUE SHIELD

## 2016-04-23 ENCOUNTER — Telehealth: Payer: Self-pay | Admitting: Family

## 2016-04-23 DIAGNOSIS — R6889 Other general symptoms and signs: Secondary | ICD-10-CM

## 2016-04-23 NOTE — Telephone Encounter (Signed)
Please let pt know that I reviewed her echo.  Echo shows mildly decreased contraction of her heart.  Not likely worrisome,however I would like her to see cardiology to make sure they don't want her to have any additional tests.

## 2016-04-23 NOTE — Telephone Encounter (Signed)
Also,  Follow up urine is negative for protein.  Does show some blood. Did she have her period that day?

## 2016-04-24 NOTE — Telephone Encounter (Signed)
Notified pt.  She voices understanding and is agreeable to proceed with cardiology referral and prefers Dr in [facility]. States she is on oral contraceptives that prevent her from having cycles. Reports that she has a history of microscopic hematuria and had urology work up when she was seeing Dr Shawna Orleans. Was told nothing further was needed after she completed wkup with urology in the past. Pt requested copy of echo be faxed to GYN, Dr Garwin Brothers and report faxed.

## 2016-04-24 NOTE — Telephone Encounter (Signed)
Delsa Sale- please refer pt to [facility] cardiology. Thanks!

## 2016-04-27 ENCOUNTER — Telehealth: Payer: Self-pay | Admitting: Family

## 2016-04-27 NOTE — Telephone Encounter (Signed)
Caller name: Self  Can be reached:  [phone]   Reason for call: Need lab results and echo results released so that she can view them on MyChart.

## 2016-04-27 NOTE — Telephone Encounter (Signed)
Results released, notified pt.

## 2016-05-05 MED FILL — NORETHIND-ETH ESTRAD 1-0.02: 1-20 | 84 days supply | Qty: 84 | Fill #0

## 2016-06-03 NOTE — Progress Notes (Signed)
   Referring: Debbrah Alar  HPI: 45 year old female for evaluation of abnormal electrocardiogram. Recent ECG April 2017 showed LVH. Echocardiogram May 2017 showed ejection fraction 50-55%. TSH April 2017 normal. Patient has dyspnea with more extreme activities but not routine activities. She denies orthopnea, PND, pedal edema, chest pain, palpitations or syncope. We were asked to evaluate.  Current Outpatient Prescriptions  Medication Sig Dispense Refill  . Cholecalciferol (VITAMIN D3) 2000 UNITS TABS Take 2,000 Units by mouth daily.      . norethindrone-ethinyl estradiol (MICROGESTIN,JUNEL,LOESTRIN) 1-20 MG-MCG tablet Take 1 tablet by mouth daily.     No current facility-administered medications for this visit.    No Known Allergies   Past Medical History  Diagnosis Date  . Microscopic hematuria   . Kidney stones     Past Surgical History  Procedure Laterality Date  . No past surgeries      Denies surgical history    Social History   Social History  . Marital Status: Married    Spouse Name: N/A  . Number of Children: 3  . Years of Education: N/A   Occupational History  . Not on file.   Social History Main Topics  . Smoking status: Never Smoker   . Smokeless tobacco: Never Used  . Alcohol Use: No  . Drug Use: No  . Sexual Activity: Not on file   Other Topics Concern  . 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 Healthy Father   . Congestive Heart Failure Father   . Other Mother     deceased from multiple myelomas    ROS: no fevers or chills, productive cough, hemoptysis, dysphasia, odynophagia, melena, hematochezia, dysuria, hematuria, rash, seizure activity, orthopnea, PND, pedal edema, claudication. Remaining systems are negative.  Physical Exam:   Blood pressure 128/89, pulse 85,  height 5\' 5"  (1.651 m), weight 202 lb 1.9 oz (91.681 kg).  General:  Well developed/well nourished in NAD Skin warm/dry Patient not depressed No peripheral clubbing Back-normal HEENT-normal/normal eyelids Neck supple/normal carotid upstroke bilaterally; no bruits; no JVD; no thyromegaly chest - CTA/ normal expansion CV - RRR/normal S1 and S2; no murmurs, rubs or gallops;  PMI nondisplaced Abdomen -NT/ND, no HSM, no mass, + bowel sounds, no bruit 2+ femoral pulses, no bruits Ext-no edema, chords, 2+ DP Neuro-grossly nonfocal  ECG 03/30/2016-sinus rhythm, left ventricular hypertrophy.  1 abnormal electrocardiogram-electrocardiogram reviewed and shows sinus rhythm with left ventricular hypertrophy. Echocardiogram personally reviewed and does not show LVH. Ejection fraction appears to be low normal in the 50-55% range. She is not having significant cardiac symptoms. We will not pursue further cardiac evaluation at this point. I will likely see her back in one year and repeat her echo to make sure that she does not have a cardiomyopathy. We discussed lifestyle modification. 2 Question cardiomyopathy-there was possible mild diffuse hypokinesis on recent echocardiogram by interpretation. As above it appears low normal to my review. 3 History of nephrolithiasis-management per primary care.  Kirk Ruths, MD

## 2016-06-10 ENCOUNTER — Encounter: Payer: Self-pay | Admitting: Cardiology

## 2016-06-10 ENCOUNTER — Ambulatory Visit (INDEPENDENT_AMBULATORY_CARE_PROVIDER_SITE_OTHER): Payer: BLUE CROSS/BLUE SHIELD | Admitting: Cardiology

## 2016-06-10 VITALS — BP 128/89 | HR 85 | Ht 65.0 in | Wt 202.1 lb

## 2016-06-10 DIAGNOSIS — I420 Dilated cardiomyopathy: Secondary | ICD-10-CM

## 2016-06-10 DIAGNOSIS — R9431 Abnormal electrocardiogram [ECG] [EKG]: Secondary | ICD-10-CM | POA: Diagnosis not present

## 2016-06-10 NOTE — Patient Instructions (Signed)
Your physician wants you to follow-up in: ONE YEAR WITH DR CRENSHAW You will receive a reminder letter in the mail two months in advance. If you don't receive a letter, please call our office to schedule the follow-up appointment.  

## 2016-07-29 MED FILL — NORETHIND-ETH ESTRAD 1-0.02: 1-20 | 84 days supply | Qty: 84 | Fill #1

## 2016-08-06 ENCOUNTER — Telehealth: Payer: Self-pay | Admitting: Family

## 2016-08-06 NOTE — Telephone Encounter (Signed)
Relation to [id number] Call back number:[phone]   Reason for call:  Patient has a mole on the left side of her neck and would like PCP to remove at 08/24/2016 appointment. Please advise if mole can be removed?

## 2016-08-06 NOTE — Telephone Encounter (Signed)
Ok

## 2016-08-06 NOTE — Telephone Encounter (Signed)
Please advise 

## 2016-08-07 NOTE — Telephone Encounter (Signed)
Notified pt and she voices understanding. Pt would also like to get flu shot then too.

## 2016-08-24 ENCOUNTER — Ambulatory Visit (INDEPENDENT_AMBULATORY_CARE_PROVIDER_SITE_OTHER): Payer: BLUE CROSS/BLUE SHIELD | Admitting: Family

## 2016-08-24 ENCOUNTER — Encounter: Payer: Self-pay | Admitting: Family

## 2016-08-24 VITALS — BP 123/98 | HR 99 | Temp 98.5°F | Resp 16 | Ht 65.0 in | Wt 201.4 lb

## 2016-08-24 DIAGNOSIS — Z23 Encounter for immunization: Secondary | ICD-10-CM

## 2016-08-24 DIAGNOSIS — D229 Melanocytic nevi, unspecified: Secondary | ICD-10-CM

## 2016-08-24 NOTE — Progress Notes (Signed)
Pre visit review using our clinic review tool, if applicable. No additional management support is needed unless otherwise documented below in the visit note. 

## 2016-08-24 NOTE — Progress Notes (Signed)
   Subjective:    Patient ID: MARYMAR VANDERPLAATS, female    DOB: 01/14/1971, 45 y.o.   MRN: [id number]  HPI  Ms Meleski is a 45 yr old female who presents today for removal of 2 moles.  Both are on the back of her neck and bother her when she wears a necklace.    Review of Systems See HPI     Objective:   Physical Exam  Constitutional: She is oriented to person, place, and time. She appears well-developed and well-nourished. No distress.  Neurological: She is alert and oriented to person, place, and time.  Skin: Skin is warm and dry.  Two moles (hyperpigmented, raised) noted   One is in the middle of her posterior neck and the other is on the left posterior neck.  2-81mm each  Psychiatric: She has a normal mood and affect. Her behavior is normal. Judgment and thought content normal.          Assessment & Plan:  Skin lesions- Procedure including risks/benefits explained to patient.  Questions were answered. After informed consent was obtained and a time out completed, sites were cleansed with betadine and then alcohol. 1% Lidocaine with epinephrine was injected under each lesion and then shave removal was performed pf 2 lesions.  Area was cauterized to obtain hemostasis.  Pt tolerated procedure well.  Specimen sent for pathology review.  Pt instructed to keep the area dry for 24 hours and to contact us if he develops redness, drainage or swelling at the site.  Pt may use tylenol as needed for discomfort today.

## 2016-08-24 NOTE — Patient Instructions (Signed)
You may use tylenol as needed for pain. Call if redness/drainage/swelling occurs at the site.

## 2016-08-26 ENCOUNTER — Encounter: Payer: Self-pay | Admitting: Family

## 2016-10-14 MED FILL — NORETHIND-ETH ESTRAD 1-0.02: 1-20 | 84 days supply | Qty: 84 | Fill #2

## 2017-01-04 MED FILL — NORETHIND-ETH ESTRAD 1-0.02: 1-20 | 84 days supply | Qty: 84 | Fill #3

## 2017-01-25 ENCOUNTER — Encounter: Payer: Self-pay | Admitting: Family

## 2017-01-25 ENCOUNTER — Telehealth: Payer: Self-pay | Admitting: Family

## 2017-01-25 DIAGNOSIS — Z807 Family history of other malignant neoplasms of lymphoid, hematopoietic and related tissues: Secondary | ICD-10-CM

## 2017-01-25 DIAGNOSIS — Z0000 Encounter for general adult medical examination without abnormal findings: Secondary | ICD-10-CM

## 2017-01-25 NOTE — Telephone Encounter (Signed)
Orders placed.

## 2017-01-25 NOTE — Telephone Encounter (Signed)
Pt have CPE scheduled for 04/02/17 at 7:15. She said that she would like to come in before her appt to have her labs. Also she said that she have a special lab completed also, she said that she's not sure of the name but it pertains to cancer. She says that her mom had cancer so she has a special screening when she have her cpe.   Please advise.   [phone]

## 2017-01-25 NOTE — Telephone Encounter (Signed)
Notified pt and scheduled lab appt for 03/23/17 at 7am. Future orders signed.

## 2017-01-25 NOTE — Telephone Encounter (Signed)
Melissa - Please advise what labs / Dx to order-Pt mom had multiple myeloma

## 2017-03-01 DIAGNOSIS — Z1231 Encounter for screening mammogram for malignant neoplasm of breast: Secondary | ICD-10-CM | POA: Diagnosis not present

## 2017-03-23 ENCOUNTER — Other Ambulatory Visit (INDEPENDENT_AMBULATORY_CARE_PROVIDER_SITE_OTHER): Payer: 59

## 2017-03-23 DIAGNOSIS — Z0000 Encounter for general adult medical examination without abnormal findings: Secondary | ICD-10-CM | POA: Diagnosis not present

## 2017-03-23 DIAGNOSIS — Z807 Family history of other malignant neoplasms of lymphoid, hematopoietic and related tissues: Secondary | ICD-10-CM

## 2017-03-23 LAB — CBC WITH DIFFERENTIAL/PLATELET
BASOS PCT: 0.4 % (ref 0.0–3.0)
Basophils Absolute: 0 10*3/uL (ref 0.0–0.1)
EOS ABS: 0.1 10*3/uL (ref 0.0–0.7)
Eosinophils Relative: 1.3 % (ref 0.0–5.0)
HEMATOCRIT: 42.9 % (ref 36.0–46.0)
Hemoglobin: 14.4 g/dL (ref 12.0–15.0)
LYMPHS PCT: 39.1 % (ref 12.0–46.0)
Lymphs Abs: 4 10*3/uL (ref 0.7–4.0)
MCHC: 33.6 g/dL (ref 30.0–36.0)
MCV: 93.5 fl (ref 78.0–100.0)
Monocytes Absolute: 0.6 10*3/uL (ref 0.1–1.0)
Monocytes Relative: 6 % (ref 3.0–12.0)
NEUTROS ABS: 5.5 10*3/uL (ref 1.4–7.7)
Neutrophils Relative %: 53.2 % (ref 43.0–77.0)
PLATELETS: 360 10*3/uL (ref 150.0–400.0)
RBC: 4.59 Mil/uL (ref 3.87–5.11)
RDW: 14.7 % (ref 11.5–15.5)
WBC: 10.3 10*3/uL (ref 4.0–10.5)

## 2017-03-23 LAB — URINALYSIS, ROUTINE W REFLEX MICROSCOPIC
Bilirubin Urine: NEGATIVE
Ketones, ur: NEGATIVE
Leukocytes, UA: NEGATIVE
Nitrite: NEGATIVE
Specific Gravity, Urine: 1.02 (ref 1.000–1.030)
Total Protein, Urine: NEGATIVE
Urine Glucose: NEGATIVE
Urobilinogen, UA: 0.2 (ref 0.0–1.0)
pH: 5.5 (ref 5.0–8.0)

## 2017-03-23 LAB — BASIC METABOLIC PANEL
BUN: 13 mg/dL (ref 6–23)
CALCIUM: 9.4 mg/dL (ref 8.4–10.5)
CO2: 25 mEq/L (ref 19–32)
CREATININE: 0.95 mg/dL (ref 0.40–1.20)
Chloride: 105 mEq/L (ref 96–112)
GFR: 81.99 mL/min (ref >60.00)
Glucose, Bld: 97 mg/dL (ref 70–99)
POTASSIUM: 4.3 meq/L (ref 3.5–5.1)
Sodium: 138 mEq/L (ref 135–145)

## 2017-03-23 LAB — HEPATIC FUNCTION PANEL
ALT: 35 U/L (ref 0–35)
AST: 23 U/L (ref 0–37)
Albumin: 4.1 g/dL (ref 3.5–5.2)
Alkaline Phosphatase: 68 U/L (ref 39–117)
BILIRUBIN DIRECT: 0.1 mg/dL (ref 0.0–0.3)
BILIRUBIN TOTAL: 0.4 mg/dL (ref 0.2–1.2)
Total Protein: 7.3 g/dL (ref 6.0–8.3)

## 2017-03-23 LAB — LIPID PANEL
CHOL/HDL RATIO: 4
Cholesterol: 196 mg/dL (ref 0–200)
HDL: 54.6 mg/dL (ref >39.00)
LDL CALC: 120 mg/dL — AB (ref 0–99)
NonHDL: 141.13
TRIGLYCERIDES: 104 mg/dL (ref 0.0–149.0)
VLDL: 20.8 mg/dL (ref 0.0–40.0)

## 2017-03-23 LAB — TSH: TSH: 1.53 u[IU]/mL (ref 0.35–4.50)

## 2017-03-25 MED FILL — NORETHIND-ETH ESTRAD 1-0.02: 1-20 | 84 days supply | Qty: 84 | Fill #4

## 2017-03-26 LAB — MULTIPLE MYELOMA PANEL, SERUM
ALBUMIN/GLOB SERPL: 1.1 (ref 0.7–1.7)
Albumin SerPl Elph-Mcnc: 3.6 g/dL (ref 2.9–4.4)
Alpha 1: 0.3 g/dL (ref 0.0–0.4)
Alpha2 Glob SerPl Elph-Mcnc: 0.8 g/dL (ref 0.4–1.0)
B-Globulin SerPl Elph-Mcnc: 1.3 g/dL (ref 0.7–1.3)
GAMMA GLOB SERPL ELPH-MCNC: 1 g/dL (ref 0.4–1.8)
GLOBULIN, TOTAL: 3.4 g/dL (ref 2.2–3.9)
IGA/IMMUNOGLOBULIN A, SERUM: 189 mg/dL (ref 87–352)
IgG (Immunoglobin G), Serum: 949 mg/dL (ref 700–1600)
IgM (Immunoglobulin M), Srm: 50 mg/dL (ref 26–217)
Total Protein: 7 g/dL (ref 6.0–8.5)

## 2017-04-02 ENCOUNTER — Ambulatory Visit (INDEPENDENT_AMBULATORY_CARE_PROVIDER_SITE_OTHER): Payer: 59 | Admitting: Family

## 2017-04-02 ENCOUNTER — Encounter: Payer: Self-pay | Admitting: Family

## 2017-04-02 VITALS — BP 133/94 | HR 77 | Temp 98.1°F | Resp 20 | Ht 65.5 in | Wt 207.2 lb

## 2017-04-02 DIAGNOSIS — Z0000 Encounter for general adult medical examination without abnormal findings: Secondary | ICD-10-CM | POA: Diagnosis not present

## 2017-04-02 DIAGNOSIS — E669 Obesity, unspecified: Secondary | ICD-10-CM

## 2017-04-02 DIAGNOSIS — I10 Essential (primary) hypertension: Secondary | ICD-10-CM

## 2017-04-02 MED ORDER — CHLORTHALIDONE 25 MG PO TABS: 25.0000 mg | ORAL_TABLET | Freq: Every day | ORAL | 2 refills | Status: DC

## 2017-04-02 MED FILL — CHLORTHALIDONE 25 MG TABLET: 25 | 30 days supply | Qty: 30 | Fill #0

## 2017-04-02 NOTE — Progress Notes (Signed)
Pre visit review using our clinic review tool, if applicable. No additional management support is needed unless otherwise documented below in the visit note. 

## 2017-04-02 NOTE — Patient Instructions (Addendum)
Please begin chlorthalidone for your blood pressure.  You will be contacted about your referral to Dr. Leafy Ro.

## 2017-04-02 NOTE — Progress Notes (Signed)
Subjective:    Patient ID: Yvonne [name], female    DOB: [birth year], 46 y.o.   MRN: [id number]  HPI  Yvonne [name] is a 46 yr old female who presents today for cpx.  Immunizations: tetanus up to date Diet:  Wt Readings from Last 3 Encounters:  04/02/17 207 lb 3.2 oz (94 kg)  08/24/16 201 lb 6.4 oz (91.4 kg)  06/10/16 202 lb 1.9 oz (91.7 kg)   Has cut back on   [place]/water Salad/nuts/meat Small dinner  Working with a nutritionist, using my fitness pal  Drinks water/iced tea  Eats when she is not hungry Exercise: Pap Smear: 3/16 Mammogram: 3/18 Dental: up to date Vision: up to date  BP Readings from Last 3 Encounters:  04/02/17 (!) 133/94  08/24/16 (!) 123/98  06/10/16 128/89       Review of Systems  Constitutional: Negative for unexpected weight change.  HENT: Negative for hearing loss and rhinorrhea.   Eyes: Negative for visual disturbance.  Respiratory: Negative for cough.   Cardiovascular: Negative for leg swelling.  Gastrointestinal: Negative for constipation and diarrhea.  Genitourinary: Negative for dysuria and frequency.  Musculoskeletal: Negative for arthralgias and myalgias.  Skin: Negative for rash.  Neurological: Negative for headaches.  Hematological: Negative for adenopathy.  Psychiatric/Behavioral:       Denies depression/anxiety       Past Medical History:  Diagnosis Date  . Kidney stones   . Microscopic hematuria      Social History   Social History  . Marital status: Married    Spouse name: N/A  . Number of children: 3  . Years of education: N/A   Occupational History  . Not on file.   Social History Main Topics  . Smoking status: Never Smoker  . Smokeless tobacco: Never Used  . Alcohol use No  . Drug use: No  . Sexual activity: Not on file   Other Topics Concern  . 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 NO PAST SURGERIES     Denies surgical history    Family History  Problem Relation Age of Onset  . Healthy Father   . Congestive Heart Failure Father   . Other Mother     deceased from multiple myelomas    No Known Allergies  Current Outpatient Prescriptions on File Prior to Visit  Medication Sig Dispense Refill  . Cholecalciferol (VITAMIN D3) 2000 UNITS TABS Take 2,000 Units by mouth daily.      . norethindrone-ethinyl estradiol (MICROGESTIN,JUNEL,LOESTRIN) 1-20 MG-MCG tablet Take 1 tablet by mouth daily.     No current facility-administered medications on file prior to visit.     BP (!) 133/94 (BP Location: Right Arm, Cuff Size: Large)   Pulse 77   Temp 98.1 F (36.7 C) (Oral)   Resp 20   Ht 5' 5.5" (1.664 m)   Wt 207 lb 3.2 oz (94 kg)   SpO2 99% Comment: room air  BMI 33.96 kg/m    Objective:   Physical Exam  Physical Exam  Constitutional: She is oriented to person, place, and time. She appears well-developed and well-nourished. No distress.  HENT:  Head: Normocephalic and atraumatic.  Right Ear: Tympanic membrane and ear canal normal.  Left  Ear: Tympanic membrane and ear canal normal.  Mouth/Throat: Oropharynx is clear and moist.  Eyes: Pupils are equal, round, and reactive to light. No scleral icterus.  Neck: Normal range of motion. No thyromegaly present.  Cardiovascular: Normal rate and regular rhythm.   No murmur heard. Pulmonary/Chest: Effort normal and breath sounds normal. No respiratory distress. He has no wheezes. She has no rales. She exhibits no tenderness.  Abdominal: Soft. Bowel sounds are normal. She exhibits no distension and no mass. There is no tenderness. There is no rebound and no guarding.  Musculoskeletal: She exhibits no edema.  Lymphadenopathy:    She has no cervical adenopathy.  Neurological: She is alert and oriented to person, place, and time. She has normal patellar  reflexes. She exhibits normal muscle tone. Coordination normal.  Skin: Skin is warm and dry.  Psychiatric: She has a normal mood and affect. Her behavior is normal. Judgment and thought content normal.  Breasts: Examined lying Right: Without masses, retractions, discharge or axillary adenopathy.  Left: Without masses, retractions, discharge or axillary adenopathy.            Assessment & Plan:          Assessment & Plan:   Preventative care- discussed healthy diet, exercise, weight loss.  EKG tracing is personally reviewed.  EKG notes NSR.  No acute changes. Immunizations reviewed and up to date. Pap/mammo up to date.  Obesity- She would like referral to medical weight loss clinic.  HTN- DBP consistently high. L atrial enlargement noted on EKG.  Will add chlorthalidone.

## 2017-04-14 ENCOUNTER — Other Ambulatory Visit: Payer: 59

## 2017-04-14 ENCOUNTER — Ambulatory Visit (INDEPENDENT_AMBULATORY_CARE_PROVIDER_SITE_OTHER): Payer: 59 | Admitting: Family Medicine

## 2017-04-14 VITALS — BP 119/88 | HR 97

## 2017-04-14 DIAGNOSIS — E876 Hypokalemia: Secondary | ICD-10-CM | POA: Diagnosis not present

## 2017-04-14 DIAGNOSIS — I10 Essential (primary) hypertension: Secondary | ICD-10-CM | POA: Diagnosis not present

## 2017-04-14 NOTE — Progress Notes (Addendum)
Pre visit review using our clinic review tool, if applicable. No additional management support is needed unless otherwise documented below in the visit note.  Patient came in clinic today for blood pressure check per OV note 04/02/17. She voices medication adherence. Patient is asymptomatic. Readings during nurse visit were: BP 119/88 P 97 02 99%.  Per Dr. Lorelei Pont: Continue current medication & regimen. Complete BMP (Basic Metabolic Panel) lab work today. Follow-up with PCP in 2 months.  Informed patient of the provider's recommendations. She verbalized understanding and did not have any further questions or concerns before leaving the visit. Next appointment scheduled for 06/14/17 at 5:15 PM.  addnd-  Received her labs 5/11 Her K is slightly low Message to pt; would ask her to increase her dietary potassium intake and repeat BMP only in 1 week Results for orders placed or performed in visit on 72/53/66  Basic metabolic panel  Result Value Ref Range   Sodium 139 135 - 145 mEq/L   Potassium 3.3 (L) 3.5 - 5.1 mEq/L   Chloride 102 96 - 112 mEq/L   CO2 27 19 - 32 mEq/L   Glucose, Bld 96 70 - 99 mg/dL   BUN 19 6 - 23 mg/dL   Creatinine, Ser 1.03 0.40 - 1.20 mg/dL   Calcium 9.4 8.4 - 10.5 mg/dL   GFR 74.66 >60.00 mL/min

## 2017-04-14 NOTE — Patient Instructions (Addendum)
Per Dr. Lorelei Pont: Continue current medication & regimen. Complete BMP (Basic Metabolic Panel) lab work today. Follow-up with PCP in 2 months.

## 2017-04-15 LAB — BASIC METABOLIC PANEL
BUN: 19 mg/dL (ref 6–23)
CALCIUM: 9.4 mg/dL (ref 8.4–10.5)
CO2: 27 mEq/L (ref 19–32)
CREATININE: 1.03 mg/dL (ref 0.40–1.20)
Chloride: 102 mEq/L (ref 96–112)
GFR: 74.66 mL/min (ref >60.00)
Glucose, Bld: 96 mg/dL (ref 70–99)
Potassium: 3.3 mEq/L — ABNORMAL LOW (ref 3.5–5.1)
Sodium: 139 mEq/L (ref 135–145)

## 2017-04-16 ENCOUNTER — Encounter: Payer: Self-pay | Admitting: Family Medicine

## 2017-04-16 NOTE — Addendum Note (Signed)
Addended by: Lamar Blinks C on: 04/16/2017 12:22 PM   Modules accepted: Orders

## 2017-04-20 ENCOUNTER — Other Ambulatory Visit: Payer: 59

## 2017-04-20 DIAGNOSIS — Z6833 Body mass index (BMI) 33.0-33.9, adult: Secondary | ICD-10-CM | POA: Diagnosis not present

## 2017-04-20 DIAGNOSIS — Z1151 Encounter for screening for human papillomavirus (HPV): Secondary | ICD-10-CM | POA: Diagnosis not present

## 2017-04-20 DIAGNOSIS — Z01419 Encounter for gynecological examination (general) (routine) without abnormal findings: Secondary | ICD-10-CM | POA: Diagnosis not present

## 2017-04-21 ENCOUNTER — Other Ambulatory Visit (INDEPENDENT_AMBULATORY_CARE_PROVIDER_SITE_OTHER): Payer: 59

## 2017-04-21 DIAGNOSIS — E876 Hypokalemia: Secondary | ICD-10-CM

## 2017-04-21 LAB — BASIC METABOLIC PANEL
BUN: 19 mg/dL (ref 6–23)
CALCIUM: 9.5 mg/dL (ref 8.4–10.5)
CO2: 22 meq/L (ref 19–32)
CREATININE: 0.91 mg/dL (ref 0.40–1.20)
Chloride: 103 mEq/L (ref 96–112)
GFR: 86.13 mL/min (ref >60.00)
Glucose, Bld: 135 mg/dL — ABNORMAL HIGH (ref 70–99)
Potassium: 3.4 mEq/L — ABNORMAL LOW (ref 3.5–5.1)
SODIUM: 137 meq/L (ref 135–145)

## 2017-04-21 MED FILL — NORETHINDRONE 0.35 MG TAB: 0.35 | 84 days supply | Qty: 84 | Fill #0

## 2017-04-22 ENCOUNTER — Encounter (INDEPENDENT_AMBULATORY_CARE_PROVIDER_SITE_OTHER): Payer: Self-pay | Admitting: Family Medicine

## 2017-04-23 LAB — HM PAP SMEAR: HM PAP: NORMAL

## 2017-04-27 MED FILL — CHLORTHALIDONE 25 MG TABLET: 25 | 30 days supply | Qty: 30 | Fill #1

## 2017-05-17 ENCOUNTER — Ambulatory Visit (INDEPENDENT_AMBULATORY_CARE_PROVIDER_SITE_OTHER): Payer: 59 | Admitting: Family Medicine

## 2017-05-17 ENCOUNTER — Ambulatory Visit (INDEPENDENT_AMBULATORY_CARE_PROVIDER_SITE_OTHER): Payer: Self-pay | Admitting: Family Medicine

## 2017-05-17 ENCOUNTER — Encounter (INDEPENDENT_AMBULATORY_CARE_PROVIDER_SITE_OTHER): Payer: Self-pay | Admitting: Family Medicine

## 2017-05-17 VITALS — BP 120/87 | HR 85 | Temp 98.0°F | Ht 65.0 in | Wt 204.0 lb

## 2017-05-17 DIAGNOSIS — R5383 Other fatigue: Secondary | ICD-10-CM

## 2017-05-17 DIAGNOSIS — Z1389 Encounter for screening for other disorder: Secondary | ICD-10-CM

## 2017-05-17 DIAGNOSIS — Z6834 Body mass index (BMI) 34.0-34.9, adult: Secondary | ICD-10-CM | POA: Diagnosis not present

## 2017-05-17 DIAGNOSIS — E669 Obesity, unspecified: Secondary | ICD-10-CM

## 2017-05-17 DIAGNOSIS — R0609 Other forms of dyspnea: Secondary | ICD-10-CM | POA: Diagnosis not present

## 2017-05-17 DIAGNOSIS — I10 Essential (primary) hypertension: Secondary | ICD-10-CM | POA: Diagnosis not present

## 2017-05-17 DIAGNOSIS — Z0289 Encounter for other administrative examinations: Secondary | ICD-10-CM

## 2017-05-17 DIAGNOSIS — Z1331 Encounter for screening for depression: Secondary | ICD-10-CM

## 2017-05-17 DIAGNOSIS — E559 Vitamin D deficiency, unspecified: Secondary | ICD-10-CM

## 2017-05-17 NOTE — Progress Notes (Signed)
Office: [phone]  /  Fax: [phone]   Dear Debbrah Alar, NP,   Thank you for referring Yvonne [name] to our clinic. The following note includes my evaluation and treatment recommendations.  HPI:   Chief Complaint: OBESITY  Yvonne [name] has been referred by Debbrah Alar, NP for consultation regarding her obesity and obesity related comorbidities.  Yvonne [name] (MR# [id number]) is a 46 y.o. female who presents on 05/17/2017 for obesity evaluation and treatment. Current BMI is Body mass index is 33.95 kg/m.Marland Kitchen Shaneen has struggled with obesity for years and has been unsuccessful in either losing weight or maintaining long term weight loss. [place] attended our information session and states she is currently in the action stage of change and ready to dedicate time achieving and maintaining a healthier weight.  Yvonne [name] states her family eats meals together she thinks her family will eat healthier with  her her desired weight loss is 35 to 60 lbs she started gaining weight after last pregnancy her heaviest weight ever was 210 lbs. she has significant food cravings issues  she snacks frequently in the evenings she skips meals frequently she is frequently drinking liquids with calories she frequently makes poor food choices she frequently eats larger portions than normal  she has binge eating behaviors   Fatigue Ilya feels her energy is lower than it should be. This has worsened with weight gain and has not worsened recently. Yvonne [name] admits to daytime somnolence and  admits to waking up still tired. Patient is at risk for obstructive sleep apnea. Patent has a history of symptoms of daytime fatigue and hypertension. Patient generally gets 5 or 6 hours of sleep per night, and states they generally have restless sleep. Snoring is present. Apneic episodes are present. Epworth Sleepiness Score is 9  Dyspnea on exertion Yvonne [name] notes increasing shortness of breath with  exercising and seems to be worsening over time with weight gain. She notes getting out of breath sooner with activity than she used to. This has not gotten worse recently. [name] denies orthopnea.  Vitamin D deficiency Yvonne [name] has a diagnosis of vitamin D deficiency. She is currently taking OTC vit D, no recent labs. She admits fatigue and denies nausea, vomiting or muscle weakness.  Hypertension Yvonne [name] is a 46 y.o. female with hypertension and is currently taking chlorthalidone to control her blood pressure. She had to change birth control pills due to her elevated blood pressure. Yvonne [name] denies chest pain. She is working weight loss to help control her blood pressure with the goal of decreasing her risk of heart attack and stroke. Monicas blood pressure is controlled today.   Depression Screen Yvonne [name]'s Food and Mood (modified PHQ-9) score was  Depression screen PHQ 2/9 05/17/2017  Decreased Interest 3  Down, Depressed, Hopeless 0  PHQ - 2 Score 3  Altered sleeping 3  Tired, decreased energy 3  Change in appetite 0  Feeling bad or failure about yourself  0  Trouble concentrating 0  Moving slowly or fidgety/restless 0  Suicidal thoughts 0  PHQ-9 Score 9    ALLERGIES: No Known Allergies  MEDICATIONS: Current Outpatient Prescriptions on File Prior to Visit  Medication Sig Dispense Refill   chlorthalidone (HYGROTON) 25 MG tablet Take 1 tablet (25 mg total) by mouth daily. 30 tablet 2   Cholecalciferol (VITAMIN D3) 2000 UNITS TABS Take 2,000 Units by mouth daily.       norethindrone-ethinyl estradiol (MICROGESTIN,JUNEL,LOESTRIN) 1-20 MG-MCG tablet Take 1 tablet by  mouth daily.     No current facility-administered medications on file prior to visit.     PAST MEDICAL HISTORY: Past Medical History:  Diagnosis Date   High blood pressure    Joint pain    Kidney stones    Microscopic hematuria    Vitamin D deficiency     PAST SURGICAL HISTORY: Past  Surgical History:  Procedure Laterality Date   NO PAST SURGERIES     Denies surgical history    SOCIAL HISTORY: Social History  Substance Use Topics   Smoking status: Never Smoker   Smokeless tobacco: Never Used   Alcohol use No    FAMILY HISTORY: Family History  Problem Relation Age of Onset   Healthy Father    Congestive Heart Failure Father    Heart disease Father    Other Mother        deceased from multiple myelomas   Cancer Mother     ROS: Review of Systems  Constitutional: Positive for malaise/fatigue.  Respiratory: Positive for shortness of breath (on exertion).   Cardiovascular: Negative for chest pain and orthopnea.  Gastrointestinal: Negative for nausea and vomiting.  Musculoskeletal:       Negative muscle weakness    PHYSICAL EXAM: Blood pressure 120/87, pulse 85, temperature 98 F (36.7 C), temperature source Oral, height 5\' 5"  (1.651 m), weight 204 lb (92.5 kg), SpO2 98 %. Body mass index is 33.95 kg/m. Physical Exam  Constitutional: She is oriented to person, place, and time. She appears well-developed and well-nourished.  Cardiovascular: Normal rate.   Pulmonary/Chest: Effort normal.  Musculoskeletal: Normal range of motion.  Neurological: She is oriented to person, place, and time.  Skin: Skin is warm and dry.  Psychiatric: She has a normal mood and affect. Her behavior is normal.  Vitals reviewed.   RECENT LABS AND TESTS: BMET    Component Value Date/Time   NA 137 04/21/2017 0713   K 3.4 (L) 04/21/2017 0713   CL 103 04/21/2017 0713   CO2 22 04/21/2017 0713   GLUCOSE 135 (H) 04/21/2017 0713   BUN 19 04/21/2017 0713   CREATININE 0.91 04/21/2017 0713   CALCIUM 9.5 04/21/2017 0713   GFRNONAA 97.61 08/26/2010 0916   GFRAA 104 02/15/2009 0918   Lab Results  Component Value Date   HGBA1C 5.8 10/12/2014   No results found for: INSULIN CBC    Component Value Date/Time   WBC 10.3 03/23/2017 0705   RBC 4.59 03/23/2017 0705    HGB 14.4 03/23/2017 0705   HCT 42.9 03/23/2017 0705   PLT 360.0 03/23/2017 0705   MCV 93.5 03/23/2017 0705   MCHC 33.6 03/23/2017 0705   RDW 14.7 03/23/2017 0705   LYMPHSABS 4.0 03/23/2017 0705   MONOABS 0.6 03/23/2017 0705   EOSABS 0.1 03/23/2017 0705   BASOSABS 0.0 03/23/2017 0705   Iron/TIBC/Ferritin/ %Sat No results found for: IRON, TIBC, FERRITIN, IRONPCTSAT Lipid Panel     Component Value Date/Time   CHOL 196 03/23/2017 0705   TRIG 104.0 03/23/2017 0705   HDL 54.60 03/23/2017 0705   CHOLHDL 4 03/23/2017 0705   VLDL 20.8 03/23/2017 0705   LDLCALC 120 (H) 03/23/2017 0705   Hepatic Function Panel     Component Value Date/Time   PROT 7.3 03/23/2017 0705   PROT 7.0 03/23/2017 0705   ALBUMIN 4.1 03/23/2017 0705   AST 23 03/23/2017 0705   ALT 35 03/23/2017 0705   ALKPHOS 68 03/23/2017 0705   BILITOT 0.4 03/23/2017 0705   BILIDIR  0.1 03/23/2017 0705   IBILI 0.2 09/23/2011 0911      Component Value Date/Time   TSH 1.53 03/23/2017 0705   TSH 0.83 03/30/2016 0818   TSH 1.50 10/12/2014 0852    ECG  shows NSR with a rate of 92 BPM INDIRECT CALORIMETER done today shows a VO2 of 297 and a REE of 2070.    ASSESSMENT AND PLAN: Other fatigue - Plan: EKG 12-Lead, Comprehensive metabolic panel, CBC With Differential, Hemoglobin A1c, Insulin, random, Lipid Panel With LDL/HDL Ratio, VITAMIN D 25 Hydroxy (Vit-D Deficiency, Fractures), Vitamin B12, Folate, T3, T4, free, TSH  Dyspnea on exertion  Vitamin D deficiency - Plan: VITAMIN D 25 Hydroxy (Vit-D Deficiency, Fractures)  Essential hypertension - Plan: Comprehensive metabolic panel  Depression screening  Class 1 obesity without serious comorbidity with body mass index (BMI) of 34.0 to 34.9 in adult, unspecified obesity type  PLAN: Fatigue Maurina was informed that her fatigue may be related to obesity, depression or many other causes. Labs will be ordered, and in the meanwhile Arria has agreed to work on diet, exercise  and weight loss to help with fatigue. Proper sleep hygiene was discussed including the need for 7-8 hours of quality sleep each night. A sleep study was not ordered based on symptoms and Epworth score.  Dyspnea on exertion Ayane's shortness of breath appears to be obesity related and exercise induced. She has agreed to work on weight loss and gradually increase exercise to treat her exercise induced shortness of breath. If [facility] follows our instructions and loses weight without improvement of her shortness of breath, we will plan to refer to pulmonology. We will monitor this condition regularly. Tyera agrees to this plan.  Vitamin D Deficiency Crystin was informed that low vitamin D levels contributes to fatigue and are associated with obesity, breast, and colon cancer. She agrees to continue to take OTC Vit D @2 ,000 IU every day and we will check labs and will follow up for routine testing of vitamin D, at least 2-3 times per year. She was informed of the risk of over-replacement of vitamin D and agrees to not increase her dose unless he discusses this with [place] first. Sherilynn agrees to follow up with our clinic in 2 weeks.  Hypertension We discussed sodium restriction, working on healthy weight loss, and a regular exercise program as the means to achieve improved blood pressure control. Brenlynn agreed with this plan and agreed to follow up as directed. We will check labs and will continue to monitor her blood pressure as well as her progress with the above lifestyle modifications. She will continue her medications as prescribed and will watch for signs of hypotension as she continues her lifestyle modifications. Her goal is to control her blood pressure with diet, exercise and weight loss. Decie agrees to follow up with our clinic in 2 weeks.  Depression Screen Irving had a mildly positive depression screening. Depression is commonly associated with obesity and often results in emotional eating  behaviors. We will monitor this closely and work on CBT to help improve the non-hunger eating patterns. Referral to Psychology may be required if no improvement is seen as she continues in our clinic.  Obesity Kandace is currently in the action stage of change and her goal is to continue with weight loss efforts. I recommend Jadelin begin the structured treatment plan as follows:  She has agreed to follow the Category 3 plan Chloey has been instructed to eventually work up to a goal of  150 minutes of combined cardio and strengthening exercise per week for weight loss and overall health benefits. We discussed the following Behavioral Modification Strategies today: meal planning & cooking strategies, increasing lean protein intake and decrease eating out  Khailee has agreed to join our obesity program and follow up with our clinic in 2 weeks. She was informed of the importance of frequent follow up visits to maximize her success with intensive lifestyle modifications for her multiple health conditions. She was informed we would discuss her lab results at her next visit unless there is a critical issue that needs to be addressed sooner. Karrina agreed to keep her next visit at the agreed upon time to discuss these results.  I, Doreene Nest, am acting as scribe for Dennard Nip, MD  I have reviewed the above documentation for accuracy and completeness, and I agree with the above. -Dennard Nip, MD  OBESITY BEHAVIORAL INTERVENTION VISIT  Today's visit was # 1 out of 22.  Starting weight: 204 lbs Starting date: 05/17/17 Today's weight : 204 lbs Today's date: 05/17/2017 Total lbs lost to date: 0 (Patients must lose 7 lbs in the first 6 months to continue with counseling)   ASK: We discussed the diagnosis of obesity with Yvonne [name] today and Amoy agreed to give [place] permission to discuss obesity behavioral modification therapy today.  ASSESS: Kaityln has the diagnosis of obesity and her BMI  today is 66 Renea is in the action stage of change   ADVISE: Francyne was educated on the multiple health risks of obesity as well as the benefit of weight loss to improve her health. She was advised of the need for long term treatment and the importance of lifestyle modifications.  AGREE: Multiple dietary modification options and treatment options were discussed and  Lis agreed to follow the Category 3 plan We discussed the following Behavioral Modification Strategies today: meal planning & cooking strategies, increasing lean protein intake and decrease eating out

## 2017-05-18 LAB — CBC WITH DIFFERENTIAL
Basophils Absolute: 0 10*3/uL (ref 0.0–0.2)
Basos: 0 %
EOS (ABSOLUTE): 0.1 10*3/uL (ref 0.0–0.4)
EOS: 1 %
HEMATOCRIT: 42.1 % (ref 34.0–46.6)
Hemoglobin: 13.7 g/dL (ref 11.1–15.9)
Immature Grans (Abs): 0 10*3/uL (ref 0.0–0.1)
Immature Granulocytes: 0 %
LYMPHS ABS: 3.9 10*3/uL — AB (ref 0.7–3.1)
Lymphs: 33 %
MCH: 30.6 pg (ref 26.6–33.0)
MCHC: 32.5 g/dL (ref 31.5–35.7)
MCV: 94 fL (ref 79–97)
MONOS ABS: 0.7 10*3/uL (ref 0.1–0.9)
Monocytes: 6 %
NEUTROS ABS: 7.2 10*3/uL — AB (ref 1.4–7.0)
NEUTROS PCT: 60 %
RBC: 4.48 x10E6/uL (ref 3.77–5.28)
RDW: 14.2 % (ref 12.3–15.4)
WBC: 11.9 10*3/uL — AB (ref 3.4–10.8)

## 2017-05-18 LAB — COMPREHENSIVE METABOLIC PANEL
ALBUMIN: 4.2 g/dL (ref 3.5–5.5)
ALT: 18 IU/L (ref 0–32)
AST: 13 IU/L (ref 0–40)
Albumin/Globulin Ratio: 1.5 (ref 1.2–2.2)
Alkaline Phosphatase: 91 IU/L (ref 39–117)
BUN / CREAT RATIO: 21 (ref 9–23)
BUN: 17 mg/dL (ref 6–24)
Bilirubin Total: 0.4 mg/dL (ref 0.0–1.2)
CO2: 24 mmol/L (ref 20–29)
CREATININE: 0.81 mg/dL (ref 0.57–1.00)
Calcium: 9.8 mg/dL (ref 8.7–10.2)
Chloride: 102 mmol/L (ref 96–106)
GFR, EST AFRICAN AMERICAN: 102 mL/min/{1.73_m2} (ref >59)
GFR, EST NON AFRICAN AMERICAN: 89 mL/min/{1.73_m2} (ref >59)
GLOBULIN, TOTAL: 2.8 g/dL (ref 1.5–4.5)
Glucose: 78 mg/dL (ref 65–99)
Potassium: 3.9 mmol/L (ref 3.5–5.2)
SODIUM: 140 mmol/L (ref 134–144)
TOTAL PROTEIN: 7 g/dL (ref 6.0–8.5)

## 2017-05-18 LAB — LIPID PANEL WITH LDL/HDL RATIO
Cholesterol, Total: 206 mg/dL — ABNORMAL HIGH (ref 100–199)
HDL: 58 mg/dL (ref >39)
LDL CALC: 130 mg/dL — AB (ref 0–99)
LDl/HDL Ratio: 2.2 ratio (ref 0.0–3.2)
Triglycerides: 88 mg/dL (ref 0–149)
VLDL CHOLESTEROL CAL: 18 mg/dL (ref 5–40)

## 2017-05-18 LAB — HEMOGLOBIN A1C
Est. average glucose Bld gHb Est-mCnc: 131 mg/dL
Hgb A1c MFr Bld: 6.2 % — ABNORMAL HIGH (ref 4.8–5.6)

## 2017-05-18 LAB — FOLATE: Folate: 5.2 ng/mL (ref >3.0)

## 2017-05-18 LAB — VITAMIN B12: VITAMIN B 12: 248 pg/mL (ref 232–1245)

## 2017-05-18 LAB — INSULIN, RANDOM: INSULIN: 27.9 u[IU]/mL — ABNORMAL HIGH (ref 2.6–24.9)

## 2017-05-18 LAB — TSH: TSH: 0.644 u[IU]/mL (ref 0.450–4.500)

## 2017-05-18 LAB — T3: T3, Total: 108 ng/dL (ref 71–180)

## 2017-05-18 LAB — T4, FREE: Free T4: 1.2 ng/dL (ref 0.82–1.77)

## 2017-05-18 LAB — VITAMIN D 25 HYDROXY (VIT D DEFICIENCY, FRACTURES): VIT D 25 HYDROXY: 22.1 ng/mL — AB (ref 30.0–100.0)

## 2017-05-20 DIAGNOSIS — H524 Presbyopia: Secondary | ICD-10-CM | POA: Diagnosis not present

## 2017-05-27 MED FILL — CHLORTHALIDONE 25 MG TABLET: 25 | 30 days supply | Qty: 30 | Fill #2

## 2017-06-01 ENCOUNTER — Ambulatory Visit (INDEPENDENT_AMBULATORY_CARE_PROVIDER_SITE_OTHER): Payer: 59 | Admitting: Family Medicine

## 2017-06-01 VITALS — BP 104/73 | HR 86 | Temp 98.4°F | Ht 65.0 in | Wt 201.0 lb

## 2017-06-01 DIAGNOSIS — Z6833 Body mass index (BMI) 33.0-33.9, adult: Secondary | ICD-10-CM | POA: Diagnosis not present

## 2017-06-01 DIAGNOSIS — E669 Obesity, unspecified: Secondary | ICD-10-CM | POA: Diagnosis not present

## 2017-06-01 DIAGNOSIS — Z6830 Body mass index (BMI) 30.0-30.9, adult: Secondary | ICD-10-CM | POA: Insufficient documentation

## 2017-06-01 DIAGNOSIS — E559 Vitamin D deficiency, unspecified: Secondary | ICD-10-CM | POA: Diagnosis not present

## 2017-06-01 DIAGNOSIS — Z9189 Other specified personal risk factors, not elsewhere classified: Secondary | ICD-10-CM | POA: Diagnosis not present

## 2017-06-01 DIAGNOSIS — E785 Hyperlipidemia, unspecified: Secondary | ICD-10-CM | POA: Diagnosis not present

## 2017-06-01 DIAGNOSIS — R7303 Prediabetes: Secondary | ICD-10-CM | POA: Insufficient documentation

## 2017-06-01 MED ORDER — VITAMIN D (ERGOCALCIFEROL) 1.25 MG (50000 UNIT) PO CAPS: 50000.0000 [IU] | ORAL_CAPSULE | ORAL | 0 refills | Status: DC

## 2017-06-01 MED FILL — VIT D2 1.25 MG (50,000 UNIT: 1.25 MG | 28 days supply | Qty: 4 | Fill #0

## 2017-06-01 NOTE — Progress Notes (Signed)
Office: [phone]  /  Fax: [phone]   HPI:   Chief Complaint: OBESITY Yvonne [name] is here to discuss her progress with her obesity treatment plan. She is on the  follow the Category 3 plan and is following her eating plan approximately 100 % of the time. She states she is exercising 0 minutes 0 times per week. Yvonne [name] has done very well with weight loss on category 3 plan, hunger is controlled. She struggled to eat all her food. She is not used to eating much protein. Her weight is 201 lb (91.2 kg) today and has had a weight loss of 3 pounds over a period of 2 weeks since her last visit. She has lost 3 lbs since starting treatment with [place].  Vitamin D deficiency Yvonne [name] has a diagnosis of vitamin D deficiency. She is currently taking OTC vit D 2,000 units daily, not yet at goal. She admits fatigue and denies nausea, vomiting or muscle weakness.  Hyperlipidemia [name] has hyperlipidemia, LDL is elevated at 130. She wants to control her cholesterol levels with intensive lifestyle modification including a low saturated fat diet, exercise and weight loss. She denies any chest pain, claudication or myalgias.  Pre-Diabetes Yvonne [name] has a new diagnosis of pre-diabetes based on her elevated Hgb A1c at 6.2 and strong family history of diabetes and was informed this puts her at greater risk of developing diabetes. She is not taking metformin currently and continues to work on diet and exercise to decrease risk of diabetes. Polyphagia is improved with diet and she denies nausea or hypoglycemia.  At risk for diabetes Yvonne [name] is at higher than average risk for developing diabetes due to her obesity and pre-diabetes. She currently denies polyuria or polydipsia.   ALLERGIES: No Known Allergies  MEDICATIONS: Current Outpatient Prescriptions on File Prior to Visit  Medication Sig Dispense Refill  . chlorthalidone (HYGROTON) 25 MG tablet Take 1 tablet (25 mg total) by mouth daily. 30 tablet 2  .  Cholecalciferol (VITAMIN D3) 2000 UNITS TABS Take 2,000 Units by mouth daily.       No current facility-administered medications on file prior to visit.     PAST MEDICAL HISTORY: Past Medical History:  Diagnosis Date  . High blood pressure   . Joint pain   . Kidney stones   . Microscopic hematuria   . Vitamin D deficiency     PAST SURGICAL HISTORY: Past Surgical History:  Procedure Laterality Date  . NO PAST SURGERIES     Denies surgical history    SOCIAL HISTORY: Social History  Substance Use Topics  . Smoking status: Never Smoker  . Smokeless tobacco: Never Used  . Alcohol use No    FAMILY HISTORY: Family History  Problem Relation Age of Onset  . Healthy Father   . Congestive Heart Failure Father   . Heart disease Father   . Other Mother        deceased from multiple myelomas  . Cancer Mother     ROS: Review of Systems  Constitutional: Positive for malaise/fatigue and weight loss.  Cardiovascular: Negative for chest pain and claudication.  Gastrointestinal: Negative for nausea and vomiting.  Genitourinary: Negative for frequency.  Musculoskeletal: Negative for myalgias.       Negative muscle weakness  Endo/Heme/Allergies: Negative for polydipsia.       Polyphagia Negative hypoglycemia    PHYSICAL EXAM: Blood pressure 104/73, pulse 86, temperature 98.4 F (36.9 C), temperature source Oral, height 5\' 5"  (1.651 m), weight 201 lb (91.2 kg),  SpO2 97 %. Body mass index is 33.45 kg/m. Physical Exam  Constitutional: She is oriented to person, place, and time. She appears well-developed and well-nourished.  Cardiovascular: Normal rate.   Pulmonary/Chest: Effort normal.  Musculoskeletal: Normal range of motion.  Neurological: She is oriented to person, place, and time.  Skin: Skin is warm and dry.  Psychiatric: She has a normal mood and affect. Her behavior is normal.  Vitals reviewed.   RECENT LABS AND TESTS: BMET    Component Value Date/Time   NA  140 05/17/2017 1142   K 3.9 05/17/2017 1142   CL 102 05/17/2017 1142   CO2 24 05/17/2017 1142   GLUCOSE 78 05/17/2017 1142   GLUCOSE 135 (H) 04/21/2017 0713   BUN 17 05/17/2017 1142   CREATININE 0.81 05/17/2017 1142   CALCIUM 9.8 05/17/2017 1142   GFRNONAA 89 05/17/2017 1142   GFRAA 102 05/17/2017 1142   Lab Results  Component Value Date   HGBA1C 6.2 (H) 05/17/2017   HGBA1C 5.8 10/12/2014   HGBA1C 5.7 09/14/2011   Lab Results  Component Value Date   INSULIN 27.9 (H) 05/17/2017   CBC    Component Value Date/Time   WBC 11.9 (H) 05/17/2017 1142   WBC 10.3 03/23/2017 0705   RBC 4.48 05/17/2017 1142   RBC 4.59 03/23/2017 0705   HGB 13.7 05/17/2017 1142   HCT 42.1 05/17/2017 1142   PLT 360.0 03/23/2017 0705   MCV 94 05/17/2017 1142   MCH 30.6 05/17/2017 1142   MCHC 32.5 05/17/2017 1142   MCHC 33.6 03/23/2017 0705   RDW 14.2 05/17/2017 1142   LYMPHSABS 3.9 (H) 05/17/2017 1142   MONOABS 0.6 03/23/2017 0705   EOSABS 0.1 05/17/2017 1142   BASOSABS 0.0 05/17/2017 1142   Iron/TIBC/Ferritin/ %Sat No results found for: IRON, TIBC, FERRITIN, IRONPCTSAT Lipid Panel     Component Value Date/Time   CHOL 206 (H) 05/17/2017 1142   TRIG 88 05/17/2017 1142   HDL 58 05/17/2017 1142   CHOLHDL 4 03/23/2017 0705   VLDL 20.8 03/23/2017 0705   LDLCALC 130 (H) 05/17/2017 1142   Hepatic Function Panel     Component Value Date/Time   PROT 7.0 05/17/2017 1142   ALBUMIN 4.2 05/17/2017 1142   AST 13 05/17/2017 1142   ALT 18 05/17/2017 1142   ALKPHOS 91 05/17/2017 1142   BILITOT 0.4 05/17/2017 1142   BILIDIR 0.1 03/23/2017 0705   IBILI 0.2 09/23/2011 0911      Component Value Date/Time   TSH 0.644 05/17/2017 1142   TSH 1.53 03/23/2017 0705   TSH 0.83 03/30/2016 0818    ASSESSMENT AND PLAN: Hyperlipidemia, unspecified hyperlipidemia type  Vitamin D deficiency - Plan: Vitamin D, Ergocalciferol, (DRISDOL) 50000 units CAPS capsule  Prediabetes  At risk for diabetes  mellitus  Class 1 obesity with serious comorbidity and body mass index (BMI) of 33.0 to 33.9 in adult, unspecified obesity type  PLAN:  Vitamin D Deficiency Mahreen was informed that low vitamin D levels contributes to fatigue and are associated with obesity, breast, and colon cancer. She agrees to start to take prescription Vit D @50 ,000 IU every week #4 with no refills and we will re-check labs in 3 months and will follow up for routine testing of vitamin D, at least 2-3 times per year. She was informed of the risk of over-replacement of vitamin D and agrees to not increase her dose unless he discusses this with [place] first. Xaniyah agrees to follow up with our clinic in  2 weeks.  Hyperlipidemia Caitlinn was informed of the American Heart Association Guidelines emphasizing intensive lifestyle modifications as the first line treatment for hyperlipidemia. We discussed many lifestyle modifications today in depth, and Jodee will continue to work on decreasing saturated fats such as fatty red meat, butter and many fried foods. She will also increase vegetables and lean protein in her diet and continue to work on exercise and weight loss efforts. We will re-check labs in 3 months.  Pre-Diabetes Cyana will continue to work on weight loss, exercise, and decreasing simple carbohydrates in her diet to help decrease the risk of diabetes. We dicussed metformin including benefits and risks. She was informed that eating too many simple carbohydrates or too many calories at one sitting increases the likelihood of GI side effects. Lacrecia declined metformin for now and a prescription was not written today. Sayuri agreed to follow up with [place] as directed to monitor her progress. We will re-check labs in 3 months.  Diabetes risk counselling Jahmiya was given extended (at least 30 minutes) diabetes prevention counseling today. She is 46 y.o. female and has risk factors for diabetes including obesity and pre-diabetes. We  discussed intensive lifestyle modifications today with an emphasis on weight loss as well as increasing exercise and decreasing simple carbohydrates in her diet.  Obesity Allahna is currently in the action stage of change. As such, her goal is to continue with weight loss efforts She has agreed to keep a food journal with 400 to 550 calories and 35 grams of protein at lunch daily and follow the Category Biscoe has been instructed to work up to a goal of 150 minutes of combined cardio and strengthening exercise per week for weight loss and overall health benefits. We discussed the following Behavioral Modification Strategies today: meal planning & cooking strategies, increasing lean protein intake, decreasing simple carbohydrates  and decrease eating out  Vianka has agreed to follow up with our clinic in 2 weeks. She was informed of the importance of frequent follow up visits to maximize her success with intensive lifestyle modifications for her multiple health conditions.  I, Doreene Nest, am acting as transcriptionist for Dennard Nip, MD  I have reviewed the above documentation for accuracy and completeness, and I agree with the above. -Dennard Nip, MD  OBESITY BEHAVIORAL INTERVENTION VISIT  Today's visit was # 2 out of 18.  Starting weight: 204 lbs Starting date: 05/17/17 Today's weight : 201 lbs Today's date: 06/01/2017 Total lbs lost to date: 3 (Patients must lose 7 lbs in the first 6 months to continue with counseling)   ASK: We discussed the diagnosis of obesity with Aldona Lento today and Aliscia agreed to give [place] permission to discuss obesity behavioral modification therapy today.  ASSESS: Anjali has the diagnosis of obesity and her BMI today is 33.5 Julyanna is in the action stage of change   ADVISE: Ahniyah was educated on the multiple health risks of obesity as well as the benefit of weight loss to improve her health. She was advised of the need for long term  treatment and the importance of lifestyle modifications.  AGREE: Multiple dietary modification options and treatment options were discussed and  Fallan agreed to keep a food journal with 400 to 550 calories and 35 grams of protein at lunch daily and follow the Category 3 plan We discussed the following Behavioral Modification Strategies today: meal planning & cooking strategies, increasing lean protein intake, decreasing simple carbohydrates  and decrease eating out

## 2017-06-07 ENCOUNTER — Telehealth (INDEPENDENT_AMBULATORY_CARE_PROVIDER_SITE_OTHER): Payer: Self-pay | Admitting: Family Medicine

## 2017-06-07 ENCOUNTER — Encounter (INDEPENDENT_AMBULATORY_CARE_PROVIDER_SITE_OTHER): Payer: Self-pay

## 2017-06-07 NOTE — Telephone Encounter (Signed)
Pt wants to know what to do for constipation.  Please call her on cell number.

## 2017-06-07 NOTE — Telephone Encounter (Signed)
Patient called in to the office stating she is experiencing constipation since starting the meal plan given by [place]. She states it has progressively worsened and she is now experiencing pain along with a little blood on the tissue when wiping.

## 2017-06-07 NOTE — Telephone Encounter (Signed)
Spoke with the patient and sent her a my chart message. Yvonne [name], [name]

## 2017-06-14 ENCOUNTER — Ambulatory Visit: Payer: 59 | Admitting: Family

## 2017-06-14 ENCOUNTER — Telehealth (INDEPENDENT_AMBULATORY_CARE_PROVIDER_SITE_OTHER): Payer: Self-pay | Admitting: Family Medicine

## 2017-06-14 ENCOUNTER — Encounter: Payer: Self-pay | Admitting: Family

## 2017-06-14 ENCOUNTER — Ambulatory Visit (INDEPENDENT_AMBULATORY_CARE_PROVIDER_SITE_OTHER): Payer: 59 | Admitting: Family

## 2017-06-14 ENCOUNTER — Ambulatory Visit (INDEPENDENT_AMBULATORY_CARE_PROVIDER_SITE_OTHER): Payer: 59 | Admitting: Family Medicine

## 2017-06-14 VITALS — BP 112/81 | HR 110 | Temp 98.0°F | Ht 65.0 in | Wt 196.0 lb

## 2017-06-14 VITALS — BP 116/82 | HR 96 | Temp 98.3°F | Resp 16 | Ht 65.0 in | Wt 200.0 lb

## 2017-06-14 DIAGNOSIS — E669 Obesity, unspecified: Secondary | ICD-10-CM

## 2017-06-14 DIAGNOSIS — I10 Essential (primary) hypertension: Secondary | ICD-10-CM | POA: Diagnosis not present

## 2017-06-14 DIAGNOSIS — E785 Hyperlipidemia, unspecified: Secondary | ICD-10-CM | POA: Diagnosis not present

## 2017-06-14 DIAGNOSIS — E559 Vitamin D deficiency, unspecified: Secondary | ICD-10-CM

## 2017-06-14 DIAGNOSIS — Z6832 Body mass index (BMI) 32.0-32.9, adult: Secondary | ICD-10-CM | POA: Diagnosis not present

## 2017-06-14 DIAGNOSIS — Z9189 Other specified personal risk factors, not elsewhere classified: Secondary | ICD-10-CM

## 2017-06-14 DIAGNOSIS — K5909 Other constipation: Secondary | ICD-10-CM | POA: Diagnosis not present

## 2017-06-14 DIAGNOSIS — R7303 Prediabetes: Secondary | ICD-10-CM

## 2017-06-14 MED ORDER — CHLORTHALIDONE 25 MG PO TABS: 25.0000 mg | ORAL_TABLET | Freq: Every day | ORAL | 1 refills | Status: DC

## 2017-06-14 MED ORDER — POLYETHYLENE GLYCOL 3350 17 GM/SCOOP PO POWD: 17.0000 g | Freq: Every day | ORAL | 0 refills | Status: DC

## 2017-06-14 MED FILL — POLYETHYLENE GLYCOL 3350 PO: 90 days supply | Qty: 1581 | Fill #0

## 2017-06-14 NOTE — Assessment & Plan Note (Signed)
She has eliminated concentrated sweets from her diet.  Encouraged her to continue dietary and weight loss efforts.

## 2017-06-14 NOTE — Telephone Encounter (Signed)
Called Eilene at pharmacy and clarified dose of Miralax.   Thank you, Macyn Shropshire

## 2017-06-14 NOTE — Progress Notes (Signed)
Subjective:    Patient ID: Yvonne [name], female    DOB: August 02, 1971, 46 y.o.   MRN: [id number]  HPI  Ms. Yvonne [name] is a 46 yr old female who presents today for follow up.  HTN- she continues chlorthalidone once daily. Denies CP/SOB or swelling.   BP Readings from Last 3 Encounters:  06/14/17 116/82  06/01/17 104/73  05/17/17 120/87   Hyperlipidemia- not on a statin. Working on [employer], exercise, weight loss.  Lab Results  Component Value Date   CHOL 206 (H) 05/17/2017   HDL 58 05/17/2017   LDLCALC 130 (H) 05/17/2017   TRIG 88 05/17/2017   CHOLHDL 4 03/23/2017   Vit D deficiency- on weekly vit D.    Hyperglycemia- borderline DM noted.  Lab Results  Component Value Date   HGBA1C 6.2 (H) 05/17/2017      Review of Systems See HPI  Past Medical History:  Diagnosis Date  . High blood pressure   . Joint pain   . Kidney stones   . Microscopic hematuria   . Vitamin D deficiency      Social History   Social History  . Marital status: Married    Spouse name: N/A  . Number of children: 3  . Years of education: N/A   Occupational History  . Referral [employer]   Social History Main Topics  . Smoking status: Never Smoker  . Smokeless tobacco: Never Used  . Alcohol use No  . Drug use: No  . Sexual activity: Not on file   Other Topics Concern  . 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 NO PAST SURGERIES     Denies surgical history    Family History  Problem Relation Age of Onset  . Healthy Father   . Congestive Heart Failure Father   . Heart disease Father   . Other Mother        deceased from multiple myelomas  . Cancer Mother     No Known Allergies  Current Outpatient Prescriptions on File Prior to Visit  Medication Sig Dispense Refill    . chlorthalidone (HYGROTON) 25 MG tablet Take 1 tablet (25 mg total) by mouth daily. 30 tablet 2  . Vitamin D, Ergocalciferol, (DRISDOL) 50000 units CAPS capsule Take 1 capsule (50,000 Units total) by mouth every 7 (seven) days. 4 capsule 0   No current facility-administered medications on file prior to visit.     BP 116/82 (BP Location: Left Arm, Cuff Size: Large)   Pulse 96   Temp 98.3 F (36.8 C) (Oral)   Resp 16   Ht 5\' 5"  (1.651 m)   Wt 200 lb (90.7 kg)   SpO2 99%   BMI 33.28 kg/m       Objective:   Physical Exam  Constitutional: She is oriented to person, place, and time. She appears well-developed and well-nourished.  Cardiovascular: Normal rate, regular rhythm and normal heart sounds.   No murmur heard. Pulmonary/Chest: Effort normal and breath sounds normal. No respiratory distress. She has no wheezes.  Musculoskeletal: She exhibits no edema.  Neurological: She is alert and oriented to person, place, and time.  Psychiatric: She has a normal mood and affect. Her  behavior is normal. Judgment and thought content normal.          Assessment & Plan:

## 2017-06-14 NOTE — Telephone Encounter (Signed)
polyethylene glycol powder (GLYCOLAX/MIRALAX) powder Pharmacy needs clarification on med please call Joanie at pharmacy [phone]

## 2017-06-14 NOTE — Progress Notes (Signed)
Office: [phone]  /  Fax: [phone]   HPI:   Chief Complaint: OBESITY Yvonne [name] is here to discuss her progress with her obesity treatment plan. She is on the  follow the Category 3 plan and is following her eating plan approximately 100 % of the time. She states she is exercising 0 minutes 0 times per week. Yvonne [name] continues to do well with weight loss. She is struggling with cravings, especially for fast food. Hunger is controlled. Her weight is 196 lb (88.9 kg) today and has had a weight loss of 5 pounds over a period of 2 weeks since her last visit. She has lost 8 lbs since starting treatment with [place].  Pre-Diabetes Yvonne [name] has a diagnosis of pre-diabetes based on her elevated Hgb A1c and was informed this puts her at greater risk of developing diabetes. She is not taking metformin currently and is attempting to control with diet and exercise to decrease risk of diabetes. She denies nausea or hypoglycemia.  Constipation Yvonne [name] notes constipation for the last few weeks, worse since attempting weight loss. She states BM are less frequent and are more hard and painful. She denies hematochezia or melena. She tried Miralax and noticed nausea, so changed to Dulcolax.  ALLERGIES: No Known Allergies  MEDICATIONS: Current Outpatient Prescriptions on File Prior to Visit  Medication Sig Dispense Refill   chlorthalidone (HYGROTON) 25 MG tablet Take 1 tablet (25 mg total) by mouth daily. 90 tablet 1   norethindrone (MICRONOR,CAMILA,ERRIN) 0.35 MG tablet Take 1 tablet by mouth daily.  99   Vitamin D, Ergocalciferol, (DRISDOL) 50000 units CAPS capsule Take 1 capsule (50,000 Units total) by mouth every 7 (seven) days. 4 capsule 0   No current facility-administered medications on file prior to visit.     PAST MEDICAL HISTORY: Past Medical History:  Diagnosis Date   High blood pressure    Joint pain    Kidney stones    Microscopic hematuria    Vitamin D deficiency     PAST  SURGICAL HISTORY: Past Surgical History:  Procedure Laterality Date   NO PAST SURGERIES     Denies surgical history    SOCIAL HISTORY: Social History  Substance Use Topics   Smoking status: Never Smoker   Smokeless tobacco: Never Used   Alcohol use No    FAMILY HISTORY: Family History  Problem Relation Age of Onset   Healthy Father    Congestive Heart Failure Father    Heart disease Father    Other Mother        deceased from multiple myelomas   Cancer Mother     ROS: Review of Systems  Constitutional: Positive for weight loss.  Gastrointestinal: Positive for constipation. Negative for melena and nausea.  Endo/Heme/Allergies:       Negative hypoglycemia    PHYSICAL EXAM: Blood pressure 112/81, pulse (!) 110, temperature 98 F (36.7 C), temperature source Oral, height 5\' 5"  (1.651 m), weight 196 lb (88.9 kg), SpO2 98 %. Body mass index is 32.62 kg/m. Physical Exam  Constitutional: She is oriented to person, place, and time. She appears well-developed and well-nourished.  Cardiovascular: Normal rate.   Pulmonary/Chest: Effort normal.  Musculoskeletal: Normal range of motion.  Neurological: She is oriented to person, place, and time.  Skin: Skin is warm and dry.  Psychiatric: She has a normal mood and affect. Her behavior is normal.  Vitals reviewed.   RECENT LABS AND TESTS: BMET    Component Value Date/Time   NA 140 05/17/2017  1142   K 3.9 05/17/2017 1142   CL 102 05/17/2017 1142   CO2 24 05/17/2017 1142   GLUCOSE 78 05/17/2017 1142   GLUCOSE 135 (H) 04/21/2017 0713   BUN 17 05/17/2017 1142   CREATININE 0.81 05/17/2017 1142   CALCIUM 9.8 05/17/2017 1142   GFRNONAA 89 05/17/2017 1142   GFRAA 102 05/17/2017 1142   Lab Results  Component Value Date   HGBA1C 6.2 (H) 05/17/2017   HGBA1C 5.8 10/12/2014   HGBA1C 5.7 09/14/2011   Lab Results  Component Value Date   INSULIN 27.9 (H) 05/17/2017   CBC    Component Value Date/Time   WBC 11.9  (H) 05/17/2017 1142   WBC 10.3 03/23/2017 0705   RBC 4.48 05/17/2017 1142   RBC 4.59 03/23/2017 0705   HGB 13.7 05/17/2017 1142   HCT 42.1 05/17/2017 1142   PLT 360.0 03/23/2017 0705   MCV 94 05/17/2017 1142   MCH 30.6 05/17/2017 1142   MCHC 32.5 05/17/2017 1142   MCHC 33.6 03/23/2017 0705   RDW 14.2 05/17/2017 1142   LYMPHSABS 3.9 (H) 05/17/2017 1142   MONOABS 0.6 03/23/2017 0705   EOSABS 0.1 05/17/2017 1142   BASOSABS 0.0 05/17/2017 1142   Iron/TIBC/Ferritin/ %Sat No results found for: IRON, TIBC, FERRITIN, IRONPCTSAT Lipid Panel     Component Value Date/Time   CHOL 206 (H) 05/17/2017 1142   TRIG 88 05/17/2017 1142   HDL 58 05/17/2017 1142   CHOLHDL 4 03/23/2017 0705   VLDL 20.8 03/23/2017 0705   LDLCALC 130 (H) 05/17/2017 1142   Hepatic Function Panel     Component Value Date/Time   PROT 7.0 05/17/2017 1142   ALBUMIN 4.2 05/17/2017 1142   AST 13 05/17/2017 1142   ALT 18 05/17/2017 1142   ALKPHOS 91 05/17/2017 1142   BILITOT 0.4 05/17/2017 1142   BILIDIR 0.1 03/23/2017 0705   IBILI 0.2 09/23/2011 0911      Component Value Date/Time   TSH 0.644 05/17/2017 1142   TSH 1.53 03/23/2017 0705   TSH 0.83 03/30/2016 0818    ASSESSMENT AND PLAN: Other constipation - Plan: polyethylene glycol powder (GLYCOLAX/MIRALAX) powder  Prediabetes  Class 1 obesity with serious comorbidity and body mass index (BMI) of 32.0 to 32.9 in adult, unspecified obesity type  PLAN:  Pre-Diabetes Yvonne [name] will continue to work on weight loss, exercise, and decreasing simple carbohydrates in her diet to help decrease the risk of diabetes. She was informed that eating too many simple carbohydrates or too many calories at one sitting increases the likelihood of GI side effects. We will re-check labs in 2 months and [facility] agreed to follow up with [place] as directed to monitor her progress.  Diabetes risk counselling Yvonne [name] was given extended (at least 15 minutes) diabetes prevention counseling  today. She is 46 y.o. female and has risk factors for diabetes including obesity. We discussed intensive lifestyle modifications today with an emphasis on weight loss as well as increasing exercise and decreasing simple carbohydrates in her diet.  Constipation Yvonne [name] was informed decrease bowel movement frequency is normal while losing weight, but stools should not be hard or painful. She was advised to  work on increasing her fiber intake. High fiber foods were discussed today. Yvonne [name] agrees to take Miralax 17 grams 1/2 dose qd and increase her H20 intake and reserve stimulant laxatives for when this doesn't work. Yvonne [name] agrees to follow up with our clinic in 2 weeks.  Obesity Yvonne [name] is currently in the action stage of  change. As such, her goal is to continue with weight loss efforts She has agreed to follow the Category 3 plan Yvonne [name] has been instructed to work up to a goal of 150 minutes of combined cardio and strengthening exercise per week or walking, count steps for weight loss and overall health benefits. We discussed the following Behavioral Modification Strategies today: increase H2O intake, increase high fiber foods, increasing lean protein intake and decrease eating out  Yvonne [name] has agreed to follow up with our clinic in 2 weeks. She was informed of the importance of frequent follow up visits to maximize her success with intensive lifestyle modifications for her multiple health conditions.  I, Doreene Nest, am acting as transcriptionist for Yvonne Nip, MD  I have reviewed the above documentation for accuracy and completeness, and I agree with the above. -Yvonne Nip, MD  OBESITY BEHAVIORAL INTERVENTION VISIT  Today's visit was # 3 out of 22.  Starting weight: 204 lbs Starting date: 05/17/17 Today's weight : 196 lbs Today's date: 06/14/2017 Total lbs lost to date: 8 (Patients must lose 7 lbs in the first 6 months to continue with counseling)   ASK: We discussed the  diagnosis of obesity with Yvonne [name] today and Yvonne [name] agreed to give [place] permission to discuss obesity behavioral modification therapy today.  ASSESS: Yvonne [name] has the diagnosis of obesity and her BMI today is 32.7 Yvonne [name] is in the action stage of change   ADVISE: Yvonne [name] was educated on the multiple health risks of obesity as well as the benefit of weight loss to improve her health. She was advised of the need for long term treatment and the importance of lifestyle modifications.  AGREE: Multiple dietary modification options and treatment options were discussed and  [place] agreed to follow the Category 3 plan We discussed the following Behavioral Modification Strategies today: increase H2O intake, increase high fiber foods,  increasing lean protein intake and decrease eating out

## 2017-06-14 NOTE — Assessment & Plan Note (Signed)
Please continue diet/exercise and weight loss efforts.

## 2017-06-14 NOTE — Assessment & Plan Note (Signed)
On weekly supplement, being managed by weight loss clinic.

## 2017-06-14 NOTE — Assessment & Plan Note (Signed)
Stable on chlorthalidone, continue same, BMET up to date.

## 2017-06-23 NOTE — Progress Notes (Signed)
      HPI: FU abnormal electrocardiogram. ECG April 2017 showed LVH. Echocardiogram May 2017 showed ejection fraction 50-55% with mild diffuse hypokinesis. Since last seen, the patient has dyspnea with more extreme activities but not with routine activities. It is relieved with rest. It is not associated with chest pain. There is no orthopnea, PND or pedal edema. There is no syncope or palpitations. There is no exertional chest pain.   Current Outpatient Prescriptions  Medication Sig Dispense Refill  . chlorthalidone (HYGROTON) 25 MG tablet Take 1 tablet (25 mg total) by mouth daily. 90 tablet 1  . norethindrone (MICRONOR,CAMILA,ERRIN) 0.35 MG tablet Take 1 tablet by mouth daily.  99  . polyethylene glycol powder (GLYCOLAX/MIRALAX) powder Take 17 g by mouth daily. 1/2 dose daily 3350 g 0  . Vitamin D, Ergocalciferol, (DRISDOL) 50000 units CAPS capsule Take 1 capsule (50,000 Units total) by mouth every 7 (seven) days. 4 capsule 0   No current facility-administered medications for this visit.      Past Medical History:  Diagnosis Date  . High blood pressure   . Joint pain   . Kidney stones   . Microscopic hematuria   . Vitamin D deficiency     Past Surgical History:  Procedure Laterality Date  . NO PAST SURGERIES     Denies surgical history    Social History   Social History  . Marital status: Married    Spouse name: N/A  . Number of children: 3  . Years of education: N/A   Occupational History  . Referral [place]   Social History Main Topics  . Smoking status: Never Smoker  . Smokeless tobacco: Never Used  . Alcohol use No  . Drug use: No  . Sexual activity: Not on file   Other Topics Concern  . 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 Healthy Father   .  Congestive Heart Failure Father   . Heart disease Father   . Other Mother        deceased from multiple myelomas  . Cancer Mother     ROS: Foot pain but no fevers or chills, productive cough, hemoptysis, dysphasia, odynophagia, melena, hematochezia, dysuria, hematuria, rash, seizure activity, orthopnea, PND, pedal edema, claudication. Remaining systems are negative.  Physical Exam: Well-developed well-nourished in no acute distress.  Skin is warm and dry.  HEENT is normal.  Neck is supple.  Chest is clear to auscultation with normal expansion.  Cardiovascular exam is regular rate and rhythm.  Abdominal exam nontender or distended. No masses palpated. Extremities show no edema. neuro grossly intact  ECG- 05/17/2017-sinus rhythm with no ST changes. personally reviewed  A/P  1 question cardiomyopathy-previous echocardiogram suggested mild diffuse hypokinesis. We will plan to repeat study to make sure her LV function remains normal.  2 hypertension-blood pressure controlled. Continue present medications.  3 history of nephrolithiasis-management per primary care.  Kirk Ruths, MD

## 2017-06-29 ENCOUNTER — Ambulatory Visit (INDEPENDENT_AMBULATORY_CARE_PROVIDER_SITE_OTHER): Payer: 59 | Admitting: Family Medicine

## 2017-06-29 VITALS — BP 107/75 | HR 86 | Temp 98.5°F | Ht 65.0 in | Wt 195.0 lb

## 2017-06-29 DIAGNOSIS — E559 Vitamin D deficiency, unspecified: Secondary | ICD-10-CM | POA: Diagnosis not present

## 2017-06-29 DIAGNOSIS — Z9189 Other specified personal risk factors, not elsewhere classified: Secondary | ICD-10-CM | POA: Diagnosis not present

## 2017-06-29 DIAGNOSIS — Z6832 Body mass index (BMI) 32.0-32.9, adult: Secondary | ICD-10-CM

## 2017-06-29 DIAGNOSIS — R7303 Prediabetes: Secondary | ICD-10-CM

## 2017-06-29 DIAGNOSIS — E669 Obesity, unspecified: Secondary | ICD-10-CM | POA: Diagnosis not present

## 2017-06-29 MED ORDER — VITAMIN D (ERGOCALCIFEROL) 1.25 MG (50000 UNIT) PO CAPS: 50000.0000 [IU] | ORAL_CAPSULE | ORAL | 0 refills | Status: DC

## 2017-06-30 MED FILL — CHLORTHALIDONE 25 MG TABLET: 25 | 30 days supply | Qty: 30 | Fill #0

## 2017-06-30 MED FILL — VIT D2 1.25 MG (50,000 UNIT: 1.25 MG | 28 days supply | Qty: 4 | Fill #0

## 2017-06-30 NOTE — Progress Notes (Signed)
Office: [phone]  /  Fax: [phone]   HPI:   Chief Complaint: OBESITY Yvonne [name] is here to discuss her progress with her obesity treatment plan. She is on the  follow the Category 3 plan and is following her eating plan approximately 100 % of the time. She states she is exercising 0 minutes 0 times per week. Yvonne [name] continues to do well with weight loss but she is struggling with stress with her father's health. She is doing better with planning ahead. Yvonne [name] is starting to get bored with the plan. Her weight is 195 lb (88.5 kg) today and has had a weight loss of 1 pound over a period of 2 weeks since her last visit. She has lost 9 lbs since starting treatment with [place].  Vitamin D deficiency Yvonne [name] has a diagnosis of vitamin D deficiency. She is currently stable on vit D but not yet at goal and she denies nausea, vomiting or muscle weakness.  Pre-Diabetes Yvonne [name] has a diagnosis of pre-diabetes based on her elevated Hgb A1c and was informed this puts her at greater risk of developing diabetes. She is not taking metformin and is currently stable with diet and exercise. Yvonne [name] is doing well overall and she continues to work on diet and exercise to decrease risk of diabetes. She denies nausea or hypoglycemia.  At risk for diabetes Yvonne [name] is at higher than average risk for developing diabetes due to her obesity and pre-diabetes. She currently denies polyuria or polydipsia.  ALLERGIES: No Known Allergies  MEDICATIONS: Current Outpatient Prescriptions on File Prior to Visit  Medication Sig Dispense Refill   chlorthalidone (HYGROTON) 25 MG tablet Take 1 tablet (25 mg total) by mouth daily. 90 tablet 1   norethindrone (MICRONOR,CAMILA,ERRIN) 0.35 MG tablet Take 1 tablet by mouth daily.  99   polyethylene glycol powder (GLYCOLAX/MIRALAX) powder Take 17 g by mouth daily. 1/2 dose daily 3350 g 0   No current facility-administered medications on file prior to visit.     PAST MEDICAL  HISTORY: Past Medical History:  Diagnosis Date   High blood pressure    Joint pain    Kidney stones    Microscopic hematuria    Vitamin D deficiency     PAST SURGICAL HISTORY: Past Surgical History:  Procedure Laterality Date   NO PAST SURGERIES     Denies surgical history    SOCIAL HISTORY: Social History  Substance Use Topics   Smoking status: Never Smoker   Smokeless tobacco: Never Used   Alcohol use No    FAMILY HISTORY: Family History  Problem Relation Age of Onset   Healthy Father    Congestive Heart Failure Father    Heart disease Father    Other Mother        deceased from multiple myelomas   Cancer Mother     ROS: Review of Systems  Constitutional: Positive for weight loss.  Gastrointestinal: Negative for nausea and vomiting.  Genitourinary: Negative for frequency.  Musculoskeletal:       Negative muscle weakness  Endo/Heme/Allergies: Negative for polydipsia.       Negative hypoglycemia    PHYSICAL EXAM: Blood pressure 107/75, pulse 86, temperature 98.5 F (36.9 C), temperature source Oral, height 5\' 5"  (1.651 m), weight 195 lb (88.5 kg), SpO2 99 %. Body mass index is 32.45 kg/m. Physical Exam  Constitutional: She is oriented to person, place, and time. She appears well-developed and well-nourished.  Cardiovascular: Normal rate.   Pulmonary/Chest: Effort normal.  Musculoskeletal: Normal range of  motion.  Neurological: She is oriented to person, place, and time.  Skin: Skin is warm and dry.  Psychiatric: She has a normal mood and affect. Her behavior is normal.  Vitals reviewed.   RECENT LABS AND TESTS: BMET    Component Value Date/Time   NA 140 05/17/2017 1142   K 3.9 05/17/2017 1142   CL 102 05/17/2017 1142   CO2 24 05/17/2017 1142   GLUCOSE 78 05/17/2017 1142   GLUCOSE 135 (H) 04/21/2017 0713   BUN 17 05/17/2017 1142   CREATININE 0.81 05/17/2017 1142   CALCIUM 9.8 05/17/2017 1142   GFRNONAA 89 05/17/2017 1142    GFRAA 102 05/17/2017 1142   Lab Results  Component Value Date   HGBA1C 6.2 (H) 05/17/2017   HGBA1C 5.8 10/12/2014   HGBA1C 5.7 09/14/2011   Lab Results  Component Value Date   INSULIN 27.9 (H) 05/17/2017   CBC    Component Value Date/Time   WBC 11.9 (H) 05/17/2017 1142   WBC 10.3 03/23/2017 0705   RBC 4.48 05/17/2017 1142   RBC 4.59 03/23/2017 0705   HGB 13.7 05/17/2017 1142   HCT 42.1 05/17/2017 1142   PLT 360.0 03/23/2017 0705   MCV 94 05/17/2017 1142   MCH 30.6 05/17/2017 1142   MCHC 32.5 05/17/2017 1142   MCHC 33.6 03/23/2017 0705   RDW 14.2 05/17/2017 1142   LYMPHSABS 3.9 (H) 05/17/2017 1142   MONOABS 0.6 03/23/2017 0705   EOSABS 0.1 05/17/2017 1142   BASOSABS 0.0 05/17/2017 1142   Iron/TIBC/Ferritin/ %Sat No results found for: IRON, TIBC, FERRITIN, IRONPCTSAT Lipid Panel     Component Value Date/Time   CHOL 206 (H) 05/17/2017 1142   TRIG 88 05/17/2017 1142   HDL 58 05/17/2017 1142   CHOLHDL 4 03/23/2017 0705   VLDL 20.8 03/23/2017 0705   LDLCALC 130 (H) 05/17/2017 1142   Hepatic Function Panel     Component Value Date/Time   PROT 7.0 05/17/2017 1142   ALBUMIN 4.2 05/17/2017 1142   AST 13 05/17/2017 1142   ALT 18 05/17/2017 1142   ALKPHOS 91 05/17/2017 1142   BILITOT 0.4 05/17/2017 1142   BILIDIR 0.1 03/23/2017 0705   IBILI 0.2 09/23/2011 0911      Component Value Date/Time   TSH 0.644 05/17/2017 1142   TSH 1.53 03/23/2017 0705   TSH 0.83 03/30/2016 0818    ASSESSMENT AND PLAN: Prediabetes  Vitamin D deficiency - Plan: Vitamin D, Ergocalciferol, (DRISDOL) 50000 units CAPS capsule  At risk for diabetes mellitus  Class 1 obesity with serious comorbidity and body mass index (BMI) of 32.0 to 32.9 in adult, unspecified obesity type  PLAN:  Vitamin D Deficiency Yvonne [name] was informed that low vitamin D levels contributes to fatigue and are associated with obesity, breast, and colon cancer. She agrees to continue to take prescription Vit D @50 ,000  IU every week, we will refill for 1 month and will follow up for routine testing of vitamin D, at least 2-3 times per year. She was informed of the risk of over-replacement of vitamin D and agrees to not increase her dose unless he discusses this with [place] first. Yvonne [name] agrees to follow up with our clinic in 2 weeks.  Pre-Diabetes Yvonne [name] will continue to work on weight loss, exercise, and decreasing simple carbohydrates in her diet to help decrease the risk of diabetes. She was informed that eating too many simple carbohydrates or too many calories at one sitting increases the likelihood of GI side effects. Yvonne [name]  agreed to follow up with [place] as directed to monitor her progress.  Diabetes risk counselling Yvonne [name] was given extended (15 minutes) diabetes prevention counseling today. She is 46 y.o. female and has risk factors for diabetes including obesity and pre-diabetes. We discussed intensive lifestyle modifications today with an emphasis on weight loss as well as increasing exercise and decreasing simple carbohydrates in her diet. We will re-check labs in 6 weeks and she agrees to follow up with our clinic in 2 weeks.  Obesity Yvonne [name] is currently in the action stage of change. As such, her goal is to continue with weight loss efforts She has agreed to keep a food journal with 250 to 400 calories and 25 grams of protein at breakfast daily and follow the Category 3 plan + breakfast options Yvonne [name] has been instructed to work up to a goal of 150 minutes of combined cardio and strengthening exercise per week or increase steps to 3,000 steps per day for weight loss and overall health benefits. We discussed the following Behavioral Modification Strategies today: keeping healthy foods in the home, increasing lean protein intake and dealing with family or coworker sabotage  Yvonne [name] has agreed to follow up with our clinic in 2 weeks. She was informed of the importance of frequent follow up visits to maximize her  success with intensive lifestyle modifications for her multiple health conditions.  I, Yvonne [name], am acting as transcriptionist for Yvonne Nip, MD  I have reviewed the above documentation for accuracy and completeness, and I agree with the above. -Yvonne Nip, MD   OBESITY BEHAVIORAL INTERVENTION VISIT  Today's visit was # 4 out of 22.  Starting weight: 204 lbs Starting date: 05/17/17 Today's weight : 195 lbs Today's date: 06/29/2017 Total lbs lost to date: 9 (Patients must lose 7 lbs in the first 6 months to continue with counseling)   ASK: We discussed the diagnosis of obesity with Yvonne [name] today and Yvonne [name] agreed to give [place] permission to discuss obesity behavioral modification therapy today.  ASSESS: Yvonne [name] has the diagnosis of obesity and her BMI today is 32.5 Yvonne [name] is in the action stage of change   ADVISE: Yvonne [name] was educated on the multiple health risks of obesity as well as the benefit of weight loss to improve her health. She was advised of the need for long term treatment and the importance of lifestyle modifications.  AGREE: Multiple dietary modification options and treatment options were discussed and  Yvonne [name] agreed to keep a food journal with 250 to 400 calories and 25 grams of protein at breakfast daily and follow the Category 3 plan + breakfast options We discussed the following Behavioral Modification Strategies today: keeping healthy foods in the home, increasing lean protein intake and dealing with family or coworker sabotage

## 2017-07-07 ENCOUNTER — Encounter: Payer: Self-pay | Admitting: Cardiology

## 2017-07-07 ENCOUNTER — Encounter: Payer: Self-pay | Admitting: Family Medicine

## 2017-07-07 ENCOUNTER — Ambulatory Visit (INDEPENDENT_AMBULATORY_CARE_PROVIDER_SITE_OTHER): Payer: 59 | Admitting: Cardiology

## 2017-07-07 ENCOUNTER — Ambulatory Visit (INDEPENDENT_AMBULATORY_CARE_PROVIDER_SITE_OTHER): Payer: 59 | Admitting: Family Medicine

## 2017-07-07 VITALS — BP 109/78 | HR 89 | Ht 65.0 in | Wt 196.8 lb

## 2017-07-07 DIAGNOSIS — M722 Plantar fascial fibromatosis: Secondary | ICD-10-CM | POA: Insufficient documentation

## 2017-07-07 DIAGNOSIS — I10 Essential (primary) hypertension: Secondary | ICD-10-CM | POA: Diagnosis not present

## 2017-07-07 DIAGNOSIS — I428 Other cardiomyopathies: Secondary | ICD-10-CM

## 2017-07-07 NOTE — Assessment & Plan Note (Signed)
more within body of plantar fascia than back at insertion.  Start with inserts, arch binders.  Shown home exercises and stretches to do daily.  Icing, tylenol or aleve if needed.  Consider physical therapy, injection, custom orthotics if not improving as expected.  F/u in 6 weeks.

## 2017-07-07 NOTE — Progress Notes (Signed)
PCP: Debbrah Alar, NP  Subjective:   HPI: Patient is a 46 y.o. female here for bilateral foot pain.  Patient reports for about 1 month she's had bilateral plantar foot pain. No acute injury or trauma. 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 Feels worse with prolonged immobilization, better with movement. Pain level can get up to 7-8/10 and sharp. Not tried anything for this yet. No skin changes, numbness, bruising.  Past Medical History:  Diagnosis Date  . High blood pressure   . Joint pain   . Kidney stones   . Microscopic hematuria   . Vitamin D deficiency     Current Outpatient Prescriptions on File Prior to Visit  Medication Sig Dispense Refill  . chlorthalidone (HYGROTON) 25 MG tablet Take 1 tablet (25 mg total) by mouth daily. 90 tablet 1  . norethindrone (MICRONOR,CAMILA,ERRIN) 0.35 MG tablet Take 1 tablet by mouth daily.  99  . polyethylene glycol powder (GLYCOLAX/MIRALAX) powder Take 17 g by mouth daily. 1/2 dose daily 3350 g 0  . Vitamin D, Ergocalciferol, (DRISDOL) 50000 units CAPS capsule Take 1 capsule (50,000 Units total) by mouth every 7 (seven) days. 4 capsule 0   No current facility-administered medications on file prior to visit.     Past Surgical History:  Procedure Laterality Date  . NO PAST SURGERIES     Denies surgical history    No Known Allergies  Social History   Social History  . Marital status: Married    Spouse name: N/A  . Number of children: 3  . Years of education: N/A   Occupational History  . Referral [employer]   Social History Main Topics  . Smoking status: Never Smoker  . Smokeless tobacco: Never Used  . Alcohol use No  . Drug use: No  . Sexual activity: Not on file   Other Topics Concern  . 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 Healthy Father   . Congestive Heart Failure Father   . Heart disease Father   . Other Mother        deceased from multiple myelomas  . Cancer Mother     BP 112/80   Pulse 85   Ht 5\' 5"  (1.651 m)   Wt 196 lb (88.9 kg)   BMI 32.62 kg/m   Review of Systems: See HPI above.     Objective:  Physical Exam:  Gen: NAD, comfortable in exam room  Bilateral feet/ankles: Mod overpronation. No other gross deformity, swelling, ecchymoses FROM ankles without pain. TTP midportion of both plantar fasciae.  On right some tenderness at insertion on calcaneus.  No other foot/ankle tenderness. Negative ant drawer and talar tilt.   Negative syndesmotic compression. Negative calcaneal squeeze. Thompsons test negative. NV intact distally.   Assessment & Plan:  1. Bilateral plantar fascia - more within body of plantar fascia than back at insertion.  Start with inserts, arch binders.  Shown home exercises and stretches to do daily.  Icing, tylenol or aleve if needed.  Consider physical therapy, injection, custom orthotics if not improving as expected.  F/u in 6 weeks.

## 2017-07-07 NOTE — Patient Instructions (Signed)
You have plantar fasciitis Take tylenol and/or aleve as needed for pain  Plantar fascia stretch for 20-30 seconds (do 3 of these) in morning Lowering/raise on a step exercises 3 x 10 once or twice a day - this is very important for long term recovery - can start on level ground if too painful. Can add heel walks, toe walks forward and backward as well Ice heel for 15 minutes as needed. Avoid flat shoes/barefoot walking as much as possible. Arch straps have been shown to help with pain. Inserts are important (dr. Zoe Lan active series, spencos, our green insoles, custom orthotics). Steroid injection is a consideration for short term pain relief if you are struggling. Physical therapy is also an option. Follow up with me in 6 weeks.

## 2017-07-07 NOTE — Patient Instructions (Signed)
Medication Instructions:   NO CHANGE  Testing/Procedures:  Your physician has requested that you have an echocardiogram. Echocardiography is a painless test that uses sound waves to create images of your heart. It provides your doctor with information about the size and shape of your heart and how well your heart's chambers and valves are working. This procedure takes approximately one hour. There are no restrictions for this procedure.    Follow-Up:  Your physician recommends that you schedule a follow-up appointment in: AS NEEDED PENDING TEST RESULTS      

## 2017-07-08 ENCOUNTER — Ambulatory Visit (HOSPITAL_BASED_OUTPATIENT_CLINIC_OR_DEPARTMENT_OTHER)
Admission: RE | Admit: 2017-07-08 | Discharge: 2017-07-08 | Disposition: A | Discharge status: Home / Self Care | Payer: 59 | Source: Ambulatory Visit | Attending: Cardiology | Admitting: Cardiology

## 2017-07-08 DIAGNOSIS — I428 Other cardiomyopathies: Secondary | ICD-10-CM | POA: Diagnosis not present

## 2017-07-08 NOTE — Progress Notes (Signed)
  Echocardiogram 2D Echocardiogram has been performed.  Bobbye Charleston 07/08/2017, 9:12 AM

## 2017-07-12 ENCOUNTER — Ambulatory Visit (INDEPENDENT_AMBULATORY_CARE_PROVIDER_SITE_OTHER): Payer: 59 | Admitting: Family Medicine

## 2017-07-12 VITALS — BP 101/73 | HR 81 | Temp 98.7°F | Ht 65.0 in | Wt 192.0 lb

## 2017-07-12 DIAGNOSIS — Z6832 Body mass index (BMI) 32.0-32.9, adult: Secondary | ICD-10-CM | POA: Diagnosis not present

## 2017-07-12 DIAGNOSIS — K5909 Other constipation: Secondary | ICD-10-CM | POA: Diagnosis not present

## 2017-07-12 DIAGNOSIS — E669 Obesity, unspecified: Secondary | ICD-10-CM

## 2017-07-12 MED ORDER — POLYETHYLENE GLYCOL 3350 17 GM/SCOOP PO POWD: 17.0000 g | Freq: Every day | ORAL | 0 refills | Status: DC

## 2017-07-13 MED FILL — NORETHINDRONE 0.35 MG TAB: 0.35 | 84 days supply | Qty: 84 | Fill #1

## 2017-07-13 NOTE — Progress Notes (Signed)
Office: [phone]  /  Fax: [phone]   HPI:   Chief Complaint: OBESITY Yvonne [name] is here to discuss her progress with her obesity treatment plan. She is keeping a food journal with 250 to 400 calories and 25 grams of protein at breakfast daily and follow the Category 3 plan + breakfast options and is following her eating plan approximately 95 % of the time. She states she is walking 3,000 steps a day for 5 to 7 times per week. Yvonne [name] continues to do well with weight loss but is sometimes skipping meals due to her hectic schedule and upcoming move. She has started counting her steps and is averaging 3,000 steps per day. Her hunger is controlled and she does note workplace sabotage. Her weight is 192 lb (87.1 kg) today and has had a weight loss of 3 pounds over a period of 2 weeks since her last visit. She has lost 12 lbs since starting treatment with [place].  Constipation Yvonne [name] notes constipation symptoms improved on miralax. She denies abdominal pain or diarrhea. She states BM are less frequent and are not hard and painful. She denies hematochezia or melena.   ALLERGIES: No Known Allergies  MEDICATIONS: Current Outpatient Prescriptions on File Prior to Visit  Medication Sig Dispense Refill  . chlorthalidone (HYGROTON) 25 MG tablet Take 1 tablet (25 mg total) by mouth daily. 90 tablet 1  . norethindrone (MICRONOR,CAMILA,ERRIN) 0.35 MG tablet Take 1 tablet by mouth daily.  99  . Vitamin D, Ergocalciferol, (DRISDOL) 50000 units CAPS capsule Take 1 capsule (50,000 Units total) by mouth every 7 (seven) days. 4 capsule 0   No current facility-administered medications on file prior to visit.     PAST MEDICAL HISTORY: Past Medical History:  Diagnosis Date  . High blood pressure   . Joint pain   . Kidney stones   . Microscopic hematuria   . Vitamin D deficiency     PAST SURGICAL HISTORY: Past Surgical History:  Procedure Laterality Date  . NO PAST SURGERIES     Denies surgical  history    SOCIAL HISTORY: Social History  Substance Use Topics  . Smoking status: Never Smoker  . Smokeless tobacco: Never Used  . Alcohol use No    FAMILY HISTORY: Family History  Problem Relation Age of Onset  . Healthy Father   . Congestive Heart Failure Father   . Heart disease Father   . Other Mother        deceased from multiple myelomas  . Cancer Mother     ROS: Review of Systems  Constitutional: Positive for weight loss.  Gastrointestinal: Positive for constipation. Negative for abdominal pain, blood in stool, diarrhea and melena.    PHYSICAL EXAM: Blood pressure 101/73, pulse 81, temperature 98.7 F (37.1 C), temperature source Oral, height 5\' 5"  (1.651 m), weight 192 lb (87.1 kg), SpO2 99 %. Body mass index is 31.95 kg/m. Physical Exam  Constitutional: She is oriented to person, place, and time. She appears well-developed and well-nourished.  Cardiovascular: Normal rate.   Pulmonary/Chest: Effort normal.  Musculoskeletal: Normal range of motion.  Neurological: She is oriented to person, place, and time.  Skin: Skin is warm and dry.  Psychiatric: She has a normal mood and affect. Her behavior is normal.  Vitals reviewed.   RECENT LABS AND TESTS: BMET    Component Value Date/Time   NA 140 05/17/2017 1142   K 3.9 05/17/2017 1142   CL 102 05/17/2017 1142   CO2 24 05/17/2017 1142  GLUCOSE 78 05/17/2017 1142   GLUCOSE 135 (H) 04/21/2017 0713   BUN 17 05/17/2017 1142   CREATININE 0.81 05/17/2017 1142   CALCIUM 9.8 05/17/2017 1142   GFRNONAA 89 05/17/2017 1142   GFRAA 102 05/17/2017 1142   Lab Results  Component Value Date   HGBA1C 6.2 (H) 05/17/2017   HGBA1C 5.8 10/12/2014   HGBA1C 5.7 09/14/2011   Lab Results  Component Value Date   INSULIN 27.9 (H) 05/17/2017   CBC    Component Value Date/Time   WBC 11.9 (H) 05/17/2017 1142   WBC 10.3 03/23/2017 0705   RBC 4.48 05/17/2017 1142   RBC 4.59 03/23/2017 0705   HGB 13.7 05/17/2017 1142    HCT 42.1 05/17/2017 1142   PLT 360.0 03/23/2017 0705   MCV 94 05/17/2017 1142   MCH 30.6 05/17/2017 1142   MCHC 32.5 05/17/2017 1142   MCHC 33.6 03/23/2017 0705   RDW 14.2 05/17/2017 1142   LYMPHSABS 3.9 (H) 05/17/2017 1142   MONOABS 0.6 03/23/2017 0705   EOSABS 0.1 05/17/2017 1142   BASOSABS 0.0 05/17/2017 1142   Iron/TIBC/Ferritin/ %Sat No results found for: IRON, TIBC, FERRITIN, IRONPCTSAT Lipid Panel     Component Value Date/Time   CHOL 206 (H) 05/17/2017 1142   TRIG 88 05/17/2017 1142   HDL 58 05/17/2017 1142   CHOLHDL 4 03/23/2017 0705   VLDL 20.8 03/23/2017 0705   LDLCALC 130 (H) 05/17/2017 1142   Hepatic Function Panel     Component Value Date/Time   PROT 7.0 05/17/2017 1142   ALBUMIN 4.2 05/17/2017 1142   AST 13 05/17/2017 1142   ALT 18 05/17/2017 1142   ALKPHOS 91 05/17/2017 1142   BILITOT 0.4 05/17/2017 1142   BILIDIR 0.1 03/23/2017 0705   IBILI 0.2 09/23/2011 0911      Component Value Date/Time   TSH 0.644 05/17/2017 1142   TSH 1.53 03/23/2017 0705   TSH 0.83 03/30/2016 0818    ASSESSMENT AND PLAN: Other constipation - Plan: polyethylene glycol powder (GLYCOLAX/MIRALAX) powder  Class 1 obesity with serious comorbidity and body mass index (BMI) of 32.0 to 32.9 in adult, unspecified obesity type  PLAN:  Constipation Yvonne [name] was informed decrease bowel movement frequency is normal while losing weight, but stools should not be hard or painful. She was advised to increase her H20 intake and work on increasing her fiber intake. High fiber foods were discussed today. Yvonne [name] agrees to continue OTC Miralax 17 grams per day and will follow up as directed.   Obesity Yvonne [name] is currently in the action stage of change. As such, her goal is to continue with weight loss efforts She has agreed to follow the Category 3 plan Yvonne [name] has been instructed to work up to a goal of 150 minutes of combined cardio and strengthening exercise per week for weight loss and overall  health benefits. We discussed the following Behavioral Modification Strategies today: emotional eating strategies and meal planning & cooking strategies  Yvonne [name] has agreed to follow up with our clinic in 2 weeks. She was informed of the importance of frequent follow up visits to maximize her success with intensive lifestyle modifications for her multiple health conditions.  I, Doreene Nest, am acting as transcriptionist for Dennard Nip, MD  I have reviewed the above documentation for accuracy and completeness, and I agree with the above. -Dennard Nip, MD   OBESITY BEHAVIORAL INTERVENTION VISIT  Today's visit was # 5 out of 22.  Starting weight: 204 lbs Starting date: 05/17/17 Today's  weight : 192 lbs Today's date: 07/12/2017 Total lbs lost to date: 12 (Patients must lose 7 lbs in the first 6 months to continue with counseling)   ASK: We discussed the diagnosis of obesity with Yvonne [name] today and Yvonne [name] agreed to give [place] permission to discuss obesity behavioral modification therapy today.  ASSESS: Yvonne [name] has the diagnosis of obesity and her BMI today is 24 Yvonne [name] is in the action stage of change   ADVISE: Yvonne [name] was educated on the multiple health risks of obesity as well as the benefit of weight loss to improve her health. She was advised of the need for long term treatment and the importance of lifestyle modifications.  AGREE: Multiple dietary modification options and treatment options were discussed and  Yvonne [name] agreed to follow the Category 3 plan We discussed the following Behavioral Modification Strategies today: emotional eating strategies and meal planning & cooking strategies

## 2017-07-28 ENCOUNTER — Ambulatory Visit (INDEPENDENT_AMBULATORY_CARE_PROVIDER_SITE_OTHER): Payer: 59 | Admitting: Family Medicine

## 2017-07-29 MED FILL — CHLORTHALIDONE 25 MG TABLET: 25 | 30 days supply | Qty: 30 | Fill #1

## 2017-08-02 ENCOUNTER — Ambulatory Visit (INDEPENDENT_AMBULATORY_CARE_PROVIDER_SITE_OTHER): Payer: 59 | Admitting: Family Medicine

## 2017-08-02 VITALS — BP 105/70 | HR 74 | Temp 98.4°F | Ht 65.0 in | Wt 189.0 lb

## 2017-08-02 DIAGNOSIS — E559 Vitamin D deficiency, unspecified: Secondary | ICD-10-CM | POA: Diagnosis not present

## 2017-08-02 DIAGNOSIS — E669 Obesity, unspecified: Secondary | ICD-10-CM | POA: Diagnosis not present

## 2017-08-02 DIAGNOSIS — Z9189 Other specified personal risk factors, not elsewhere classified: Secondary | ICD-10-CM

## 2017-08-02 DIAGNOSIS — Z6831 Body mass index (BMI) 31.0-31.9, adult: Secondary | ICD-10-CM

## 2017-08-02 MED ORDER — VITAMIN D (ERGOCALCIFEROL) 1.25 MG (50000 UNIT) PO CAPS: 50000.0000 [IU] | ORAL_CAPSULE | ORAL | 0 refills | Status: DC

## 2017-08-03 MED FILL — VIT D2 1.25 MG (50,000 UNIT: 1.25 MG | 28 days supply | Qty: 4 | Fill #0

## 2017-08-03 NOTE — Progress Notes (Addendum)
Office: [phone]  /  Fax: [phone]   HPI:   Chief Complaint: OBESITY Yvonne [name] is here to discuss her progress with her obesity treatment plan. She is on the Category 3 plan and is following her eating plan approximately 80 % of the time. She states she is walking 3000 steps 7 times per week. Yvonne [name] continues to do well with weight loss even with elevated stress with recent move. She is doing well with planning meals ahead of time but states she is getting bored with breakfast. Her weight is 189 lb (85.7 kg) today and has had a weight loss of 3 pounds over a period of 2 weeks since her last visit. She has lost 15 lbs since starting treatment with [place].  Vitamin D deficiency Yvonne [name] has a diagnosis of vitamin D deficiency. She is currently stable on vit D but not yet at goal and denies nausea, vomiting or muscle weakness.   ALLERGIES: No Known Allergies  MEDICATIONS: Current Outpatient Prescriptions on File Prior to Visit  Medication Sig Dispense Refill  . chlorthalidone (HYGROTON) 25 MG tablet Take 1 tablet (25 mg total) by mouth daily. 90 tablet 1  . norethindrone (MICRONOR,CAMILA,ERRIN) 0.35 MG tablet Take 1 tablet by mouth daily.  99  . polyethylene glycol powder (GLYCOLAX/MIRALAX) powder Take 17 g by mouth daily. 3350 g 0   No current facility-administered medications on file prior to visit.     PAST MEDICAL HISTORY: Past Medical History:  Diagnosis Date  . High blood pressure   . Joint pain   . Kidney stones   . Microscopic hematuria   . Vitamin D deficiency     PAST SURGICAL HISTORY: Past Surgical History:  Procedure Laterality Date  . NO PAST SURGERIES     Denies surgical history    SOCIAL HISTORY: Social History  Substance Use Topics  . Smoking status: Never Smoker  . Smokeless tobacco: Never Used  . Alcohol use No    FAMILY HISTORY: Family History  Problem Relation Age of Onset  . Healthy Father   . Congestive Heart Failure Father   . Heart  disease Father   . Other Mother        deceased from multiple myelomas  . Cancer Mother     ROS: Review of Systems  Constitutional: Positive for weight loss.  Gastrointestinal: Negative for nausea and vomiting.  Musculoskeletal:       Negative muscle weakness    PHYSICAL EXAM: Blood pressure 105/70, pulse 74, temperature 98.4 F (36.9 C), temperature source Oral, height 5\' 5"  (1.651 m), weight 189 lb (85.7 kg), SpO2 98 %. Body mass index is 31.45 kg/m. Physical Exam  Constitutional: She is oriented to person, place, and time. She appears well-developed and well-nourished.  Cardiovascular: Normal rate.   Pulmonary/Chest: Effort normal.  Musculoskeletal: Normal range of motion.  Neurological: She is oriented to person, place, and time.  Skin: Skin is warm and dry.  Vitals reviewed.   RECENT LABS AND TESTS: BMET    Component Value Date/Time   NA 140 05/17/2017 1142   K 3.9 05/17/2017 1142   CL 102 05/17/2017 1142   CO2 24 05/17/2017 1142   GLUCOSE 78 05/17/2017 1142   GLUCOSE 135 (H) 04/21/2017 0713   BUN 17 05/17/2017 1142   CREATININE 0.81 05/17/2017 1142   CALCIUM 9.8 05/17/2017 1142   GFRNONAA 89 05/17/2017 1142   GFRAA 102 05/17/2017 1142   Lab Results  Component Value Date   HGBA1C 6.2 (H) 05/17/2017  HGBA1C 5.8 10/12/2014   HGBA1C 5.7 09/14/2011   Lab Results  Component Value Date   INSULIN 27.9 (H) 05/17/2017   CBC    Component Value Date/Time   WBC 11.9 (H) 05/17/2017 1142   WBC 10.3 03/23/2017 0705   RBC 4.48 05/17/2017 1142   RBC 4.59 03/23/2017 0705   HGB 13.7 05/17/2017 1142   HCT 42.1 05/17/2017 1142   PLT 360.0 03/23/2017 0705   MCV 94 05/17/2017 1142   MCH 30.6 05/17/2017 1142   MCHC 32.5 05/17/2017 1142   MCHC 33.6 03/23/2017 0705   RDW 14.2 05/17/2017 1142   LYMPHSABS 3.9 (H) 05/17/2017 1142   MONOABS 0.6 03/23/2017 0705   EOSABS 0.1 05/17/2017 1142   BASOSABS 0.0 05/17/2017 1142   Iron/TIBC/Ferritin/ %Sat No results found  for: IRON, TIBC, FERRITIN, IRONPCTSAT Lipid Panel     Component Value Date/Time   CHOL 206 (H) 05/17/2017 1142   TRIG 88 05/17/2017 1142   HDL 58 05/17/2017 1142   CHOLHDL 4 03/23/2017 0705   VLDL 20.8 03/23/2017 0705   LDLCALC 130 (H) 05/17/2017 1142   Hepatic Function Panel     Component Value Date/Time   PROT 7.0 05/17/2017 1142   ALBUMIN 4.2 05/17/2017 1142   AST 13 05/17/2017 1142   ALT 18 05/17/2017 1142   ALKPHOS 91 05/17/2017 1142   BILITOT 0.4 05/17/2017 1142   BILIDIR 0.1 03/23/2017 0705   IBILI 0.2 09/23/2011 0911      Component Value Date/Time   TSH 0.644 05/17/2017 1142   TSH 1.53 03/23/2017 0705   TSH 0.83 03/30/2016 0818    ASSESSMENT AND PLAN: Vitamin D deficiency - Plan: Vitamin D, Ergocalciferol, (DRISDOL) 50000 units CAPS capsule  Class 1 obesity with serious comorbidity and body mass index (BMI) of 31.0 to 31.9 in adult, unspecified obesity type  PLAN:  Vitamin D Deficiency Yvonne [name] was informed that low vitamin D levels contributes to fatigue and are associated with obesity, breast, and colon cancer. She agrees to continue to take prescription Vit D @50 ,000 IU every week, we will refill for 1 month and will follow up for routine testing of vitamin D, at least 2-3 times per year. She was informed of the risk of over-replacement of vitamin D and agrees to not increase her dose unless he discusses this with [place] first. Yvonne [name] agrees to follow up with our clinic in 2 to 3 weeks.  At risk for osteopenia and osteoporosis Yvonne [name] is at risk for osteopenia and osteoporsis due to her vitamin D deficiency. She was encouraged to take her vitamin D and follow her higher calcium diet and increase strengthening exercise to help strengthen her bones and decrease her risk of osteopenia and osteoporosis.  Obesity Yvonne [name] is currently in the action stage of change. As such, her goal is to continue with weight loss efforts She has agreed to follow the Category 3 plan with  breakfast options Yvonne [name] has been instructed to work up to a goal of 150 minutes of combined cardio and strengthening exercise per week for weight loss and overall health benefits. We discussed the following Behavioral Modification Strategies today: increasing lean protein intake, decreasing simple carbohydrates, and dealing with family or coworker sabotage   Yvonne [name] has agreed to follow up with our clinic in 2 to 3 weeks. She was informed of the importance of frequent follow up visits to maximize her success with intensive lifestyle modifications for her multiple health conditions.  Yvonne [name] Durie, am acting as transcriptionist for  Dennard Nip, MD  I have reviewed the above documentation for accuracy and completeness, and I agree with the above. -Dennard Nip, MD    OBESITY BEHAVIORAL INTERVENTION VISIT  Today's visit was # 6 out of 22.  Starting weight: 204 lbs Starting date: 05/17/17 Today's weight : 189 lbs Today's date: 08/02/17 Total lbs lost to date: 15 (Patients must lose 7 lbs in the first 6 months to continue with counseling)   ASK: We discussed the diagnosis of obesity with Aldona Lento today and Jalayla agreed to give [place] permission to discuss obesity behavioral modification therapy today.  ASSESS: Quentin has the diagnosis of obesity and her BMI today is 50 Abigial is in the action stage of change   ADVISE: Dhwani was educated on the multiple health risks of obesity as well as the benefit of weight loss to improve her health. She was advised of the need for long term treatment and the importance of lifestyle modifications.  AGREE: Multiple dietary modification options and treatment options were discussed and  Dylan agreed to follow the Category 3 plan with breakfast options. We discussed the following Behavioral Modification Strategies today: increasing lean protein intake, decreasing simple carbohydrates, and dealing with family or coworker sabotage

## 2017-08-19 ENCOUNTER — Ambulatory Visit: Payer: 59 | Admitting: Family Medicine

## 2017-08-23 ENCOUNTER — Ambulatory Visit (INDEPENDENT_AMBULATORY_CARE_PROVIDER_SITE_OTHER): Payer: 59 | Admitting: Family Medicine

## 2017-08-23 VITALS — BP 100/72 | HR 75 | Temp 98.1°F | Ht 65.0 in | Wt 189.0 lb

## 2017-08-23 DIAGNOSIS — E669 Obesity, unspecified: Secondary | ICD-10-CM | POA: Diagnosis not present

## 2017-08-23 DIAGNOSIS — E8881 Metabolic syndrome: Secondary | ICD-10-CM | POA: Diagnosis not present

## 2017-08-23 DIAGNOSIS — E88819 Insulin resistance, unspecified: Secondary | ICD-10-CM | POA: Insufficient documentation

## 2017-08-23 DIAGNOSIS — Z6831 Body mass index (BMI) 31.0-31.9, adult: Secondary | ICD-10-CM | POA: Diagnosis not present

## 2017-08-23 DIAGNOSIS — M722 Plantar fascial fibromatosis: Secondary | ICD-10-CM | POA: Diagnosis not present

## 2017-08-24 ENCOUNTER — Ambulatory Visit: Payer: 59 | Admitting: Family Medicine

## 2017-08-24 NOTE — Progress Notes (Signed)
Office: [phone]  /  Fax: [phone]   HPI:   Chief Complaint: OBESITY Yvonne [name] is here to discuss her progress with her obesity treatment plan. She is on the Category 3 plan and is following her eating plan approximately 80 % of the time. She states she is exercising 3,000 steps per day 5 days a week. [name] has done well maintaining her weight. She has been sick recently and has not been able to plan meals ahead of time. She has been eating less meals and increasing snacking. She is feeling better now and is ready to get back on track. Her weight is 189 lb (85.7 kg) [name] and has maintained her weight over a period of 3 weeks since her last visit. She has lost 15 lbs since starting treatment with [place].  Insulin Resistance Yvonne [name] has a diagnosis of insulin resistance based on her elevated fasting insulin level >5. Although Yvonne [name]'s blood glucose readings are still under good control, insulin resistance puts her at greater risk of metabolic syndrome and diabetes. She is not taking metformin currently and is attempting to control her insulin resistance with diet. She continues to work on diet and exercise to decrease risk of diabetes.  Plantar Fasciitis Yvonne [name] has increased pain with walking and she has increased walking at work in the past month.  ALLERGIES: No Known Allergies  MEDICATIONS: Current Outpatient Prescriptions on File Prior to Visit  Medication Sig Dispense Refill   chlorthalidone (HYGROTON) 25 MG tablet Take 1 tablet (25 mg total) by mouth daily. 90 tablet 1   norethindrone (MICRONOR,CAMILA,ERRIN) 0.35 MG tablet Take 1 tablet by mouth daily.  99   polyethylene glycol powder (GLYCOLAX/MIRALAX) powder Take 17 g by mouth daily. 3350 g 0   Vitamin D, Ergocalciferol, (DRISDOL) 50000 units CAPS capsule Take 1 capsule (50,000 Units total) by mouth every 7 (seven) days. 4 capsule 0   No current facility-administered medications on file prior to visit.     PAST MEDICAL  HISTORY: Past Medical History:  Diagnosis Date   High blood pressure    Joint pain    Kidney stones    Microscopic hematuria    Vitamin D deficiency     PAST SURGICAL HISTORY: Past Surgical History:  Procedure Laterality Date   NO PAST SURGERIES     Denies surgical history    SOCIAL HISTORY: Social History  Substance Use Topics   Smoking status: Never Smoker   Smokeless tobacco: Never Used   Alcohol use No    FAMILY HISTORY: Family History  Problem Relation Age of Onset   Healthy Father    Congestive Heart Failure Father    Heart disease Father    Other Mother        deceased from multiple myelomas   Cancer Mother     ROS: Review of Systems  Constitutional: Negative for weight loss.  Musculoskeletal:       Positive foot pain    PHYSICAL EXAM: Blood pressure 100/72, pulse 75, temperature 98.1 F (36.7 C), temperature source Oral, height 5\' 5"  (1.651 m), weight 189 lb (85.7 kg), SpO2 98 %. Body mass index is 31.45 kg/m. Physical Exam  Constitutional: She is oriented to person, place, and time. She appears well-developed and well-nourished.  Cardiovascular: Normal rate.   Pulmonary/Chest: Effort normal.  Musculoskeletal: Normal range of motion.  Neurological: She is oriented to person, place, and time.  Skin: Skin is warm and dry.  Psychiatric: She has a normal mood and affect. Her behavior is  normal.  Vitals reviewed.   RECENT LABS AND TESTS: BMET    Component Value Date/Time   NA 140 05/17/2017 1142   K 3.9 05/17/2017 1142   CL 102 05/17/2017 1142   CO2 24 05/17/2017 1142   GLUCOSE 78 05/17/2017 1142   GLUCOSE 135 (H) 04/21/2017 0713   BUN 17 05/17/2017 1142   CREATININE 0.81 05/17/2017 1142   CALCIUM 9.8 05/17/2017 1142   GFRNONAA 89 05/17/2017 1142   GFRAA 102 05/17/2017 1142   Lab Results  Component Value Date   HGBA1C 6.2 (H) 05/17/2017   HGBA1C 5.8 10/12/2014   HGBA1C 5.7 09/14/2011   Lab Results  Component Value Date    INSULIN 27.9 (H) 05/17/2017   CBC    Component Value Date/Time   WBC 11.9 (H) 05/17/2017 1142   WBC 10.3 03/23/2017 0705   RBC 4.48 05/17/2017 1142   RBC 4.59 03/23/2017 0705   HGB 13.7 05/17/2017 1142   HCT 42.1 05/17/2017 1142   PLT 360.0 03/23/2017 0705   MCV 94 05/17/2017 1142   MCH 30.6 05/17/2017 1142   MCHC 32.5 05/17/2017 1142   MCHC 33.6 03/23/2017 0705   RDW 14.2 05/17/2017 1142   LYMPHSABS 3.9 (H) 05/17/2017 1142   MONOABS 0.6 03/23/2017 0705   EOSABS 0.1 05/17/2017 1142   BASOSABS 0.0 05/17/2017 1142   Iron/TIBC/Ferritin/ %Sat No results found for: IRON, TIBC, FERRITIN, IRONPCTSAT Lipid Panel     Component Value Date/Time   CHOL 206 (H) 05/17/2017 1142   TRIG 88 05/17/2017 1142   HDL 58 05/17/2017 1142   CHOLHDL 4 03/23/2017 0705   VLDL 20.8 03/23/2017 0705   LDLCALC 130 (H) 05/17/2017 1142   Hepatic Function Panel     Component Value Date/Time   PROT 7.0 05/17/2017 1142   ALBUMIN 4.2 05/17/2017 1142   AST 13 05/17/2017 1142   ALT 18 05/17/2017 1142   ALKPHOS 91 05/17/2017 1142   BILITOT 0.4 05/17/2017 1142   BILIDIR 0.1 03/23/2017 0705   IBILI 0.2 09/23/2011 0911      Component Value Date/Time   TSH 0.644 05/17/2017 1142   TSH 1.53 03/23/2017 0705   TSH 0.83 03/30/2016 0818    ASSESSMENT AND PLAN: Plantar fasciitis - Plan: Ambulatory referral to Podiatry  Insulin resistance  Class 1 obesity with serious comorbidity and body mass index (BMI) of 31.0 to 31.9 in adult, unspecified obesity type  PLAN:  Insulin Resistance Yvonne [name] will continue to work on weight loss, exercise, and decreasing simple carbohydrates in her diet to help decrease the risk of diabetes. We dicussed metformin including benefits and risks. She was informed that eating too many simple carbohydrates or too many calories at one sitting increases the likelihood of GI side effects. Yvonne [name] declined metformin for now and prescription was not written [name].  Yvonne [name] agreed to  follow up with [place] in 2 to 3 weeks to monitor her progress. We will re-check labs at that time.  Plantar Fasciitis We will refer to Dr. Jacqualyn Posey at [facility] agrees to follow up with our clinic in 2 to 3 weeks.   We spent > than 50% of the 30 minute visit on the counseling as documented in the note.  Obesity Yvonne [name] is currently in the action stage of change. As such, her goal is to continue with weight loss efforts She has agreed to follow the Category 3 plan Yvonne [name] has been instructed to work up to a goal of 150 minutes  of combined cardio and strengthening exercise per week for weight loss and overall health benefits. We discussed the following Behavioral Modification Strategies [name]: increasing lean protein intake, decreasing simple carbohydrates  and work on meal planning and easy cooking plans  Yvonne [name] has agreed to follow up with our clinic in 2 to 3 weeks. She was informed of the importance of frequent follow up visits to maximize her success with intensive lifestyle modifications for her multiple health conditions.  I, Doreene Nest, am acting as transcriptionist for Dennard Nip, MD  I have reviewed the above documentation for accuracy and completeness, and I agree with the above. -Dennard Nip, MD   OBESITY BEHAVIORAL INTERVENTION VISIT  [name]'s visit was # 7 out of 22.  Starting weight: 204 lbs Starting date: 05/17/17 [name]'s weight : 189 lbs [name]'s date: 08/23/2017 Total lbs lost to date: 15 (Patients must lose 7 lbs in the first 6 months to continue with counseling)   ASK: We discussed the diagnosis of obesity with Yvonne [name] and Yvonne [name] agreed to give [place] permission to discuss obesity behavioral modification therapy [name].  ASSESS: Yvonne [name] has the diagnosis of obesity and her BMI [name] is 31.45 Yvonne [name] is in the action stage of change   ADVISE: Yvonne [name] was educated on the multiple health risks of obesity as well as the benefit of  weight loss to improve her health. She was advised of the need for long term treatment and the importance of lifestyle modifications.  AGREE: Multiple dietary modification options and treatment options were discussed and  Yvonne [name] agreed to follow the Category 3 plan We discussed the following Behavioral Modification Strategies [name]: increasing lean protein intake, decreasing simple carbohydrates  and work on meal planning and easy cooking plans

## 2017-09-02 MED FILL — CHLORTHALIDONE 25 MG TABLET: 25 | 30 days supply | Qty: 30 | Fill #2

## 2017-09-13 ENCOUNTER — Ambulatory Visit (INDEPENDENT_AMBULATORY_CARE_PROVIDER_SITE_OTHER): Payer: 59 | Admitting: Family Medicine

## 2017-09-13 VITALS — BP 100/69 | HR 99 | Temp 98.1°F | Ht 65.0 in | Wt 185.0 lb

## 2017-09-13 DIAGNOSIS — E559 Vitamin D deficiency, unspecified: Secondary | ICD-10-CM | POA: Diagnosis not present

## 2017-09-13 DIAGNOSIS — R7303 Prediabetes: Secondary | ICD-10-CM

## 2017-09-13 DIAGNOSIS — E7849 Other hyperlipidemia: Secondary | ICD-10-CM | POA: Diagnosis not present

## 2017-09-13 DIAGNOSIS — Z9189 Other specified personal risk factors, not elsewhere classified: Secondary | ICD-10-CM | POA: Diagnosis not present

## 2017-09-13 DIAGNOSIS — Z6830 Body mass index (BMI) 30.0-30.9, adult: Secondary | ICD-10-CM

## 2017-09-13 DIAGNOSIS — E669 Obesity, unspecified: Secondary | ICD-10-CM | POA: Diagnosis not present

## 2017-09-13 DIAGNOSIS — I10 Essential (primary) hypertension: Secondary | ICD-10-CM

## 2017-09-13 MED ORDER — VITAMIN D (ERGOCALCIFEROL) 1.25 MG (50000 UNIT) PO CAPS: 50000.0000 [IU] | ORAL_CAPSULE | ORAL | 0 refills | Status: DC

## 2017-09-13 MED FILL — VIT D2 1.25 MG (50,000 UNIT: 1.25 MG | 28 days supply | Qty: 4 | Fill #0

## 2017-09-13 NOTE — Progress Notes (Signed)
Office: [phone]  /  Fax: [phone]   HPI:   Chief Complaint: OBESITY Yvonne [name] is here to discuss her progress with her obesity treatment plan. She is on the Category 3 plan and is following her eating plan approximately 80 % of the time. She states she is walking 3,000 steps a day 7 times per week. Yvonne [name] continues to do well with weight loss on the category 3 plan. Her weight is 185 lb (83.9 kg) today and has had a weight loss of 4 pounds over a period of 3 weeks since her last visit. She has lost 19 lbs since starting treatment with [place].  Vitamin D deficiency Yvonne [name] has a diagnosis of vitamin D deficiency. She is currently taking prescription vit D and is not yet at goal. She denies nausea, vomiting or muscle weakness.  Pre-Diabetes Yvonne [name] has a diagnosis of pre-diabetes based on her elevated Hgb A1c and was informed this puts her at greater risk of developing diabetes. She is attempting to control her pre-diabetes with diet. She is not taking metformin currently and continues to work on diet and exercise to decrease risk of diabetes. She denies nausea or hypoglycemia.  At risk for diabetes Yvonne [name] is at higher than average risk for developing diabetes due to her obesity and pre-diabetes. She currently denies polyuria or polydipsia.  Hypertension Yvonne [name] is a 46 y.o. female with hypertension.  Yvonne [name] denies chest pain or shortness of breath on exertion. She is working weight loss to help control her blood pressure with the goal of decreasing her risk of heart attack and stroke. Yvonne [name] blood pressure is currently well controlled on Chlorthalidone.  Hyperlipidemia Yvonne [name] has hyperlipidemia and has been trying to improve her cholesterol levels with intensive lifestyle modification including a low saturated fat diet, exercise and weight loss. She denies any chest pain, claudication or myalgias.  ALLERGIES: No Known Allergies  MEDICATIONS: Current Outpatient  Prescriptions on File Prior to Visit  Medication Sig Dispense Refill  . chlorthalidone (HYGROTON) 25 MG tablet Take 1 tablet (25 mg total) by mouth daily. 90 tablet 1  . norethindrone (MICRONOR,CAMILA,ERRIN) 0.35 MG tablet Take 1 tablet by mouth daily.  99  . polyethylene glycol powder (GLYCOLAX/MIRALAX) powder Take 17 g by mouth daily. 3350 g 0   No current facility-administered medications on file prior to visit.     PAST MEDICAL HISTORY: Past Medical History:  Diagnosis Date  . High blood pressure   . Joint pain   . Kidney stones   . Microscopic hematuria   . Vitamin D deficiency     PAST SURGICAL HISTORY: Past Surgical History:  Procedure Laterality Date  . NO PAST SURGERIES     Denies surgical history    SOCIAL HISTORY: Social History  Substance Use Topics  . Smoking status: Never Smoker  . Smokeless tobacco: Never Used  . Alcohol use No    FAMILY HISTORY: Family History  Problem Relation Age of Onset  . Healthy Father   . Congestive Heart Failure Father   . Heart disease Father   . Other Mother        deceased from multiple myelomas  . Cancer Mother     ROS: Review of Systems  Constitutional: Positive for weight loss.  Cardiovascular: Negative for chest pain and claudication.  Gastrointestinal: Negative for nausea and vomiting.  Genitourinary: Negative for frequency.  Musculoskeletal: Negative for myalgias.       Negative muscle weakness  Endo/Heme/Allergies: Negative for polydipsia.  Negative hyperglycemia    PHYSICAL EXAM: Blood pressure 100/69, pulse 99, temperature 98.1 F (36.7 C), temperature source Oral, height 5\' 5"  (1.651 m), weight 185 lb (83.9 kg), SpO2 97 %. Body mass index is 30.79 kg/m. Physical Exam  Constitutional: She is oriented to person, place, and time. She appears well-developed and well-nourished.  Cardiovascular: Normal rate.   Pulmonary/Chest: Effort normal.  Musculoskeletal: Normal range of motion.  Neurological:  She is oriented to person, place, and time.  Skin: Skin is warm and dry.  Psychiatric: She has a normal mood and affect. Her behavior is normal.  Vitals reviewed.   RECENT LABS AND TESTS: BMET    Component Value Date/Time   NA 140 05/17/2017 1142   K 3.9 05/17/2017 1142   CL 102 05/17/2017 1142   CO2 24 05/17/2017 1142   GLUCOSE 78 05/17/2017 1142   GLUCOSE 135 (H) 04/21/2017 0713   BUN 17 05/17/2017 1142   CREATININE 0.81 05/17/2017 1142   CALCIUM 9.8 05/17/2017 1142   GFRNONAA 89 05/17/2017 1142   GFRAA 102 05/17/2017 1142   Lab Results  Component Value Date   HGBA1C 6.2 (H) 05/17/2017   HGBA1C 5.8 10/12/2014   HGBA1C 5.7 09/14/2011   Lab Results  Component Value Date   INSULIN 27.9 (H) 05/17/2017   CBC    Component Value Date/Time   WBC 11.9 (H) 05/17/2017 1142   WBC 10.3 03/23/2017 0705   RBC 4.48 05/17/2017 1142   RBC 4.59 03/23/2017 0705   HGB 13.7 05/17/2017 1142   HCT 42.1 05/17/2017 1142   PLT 360.0 03/23/2017 0705   MCV 94 05/17/2017 1142   MCH 30.6 05/17/2017 1142   MCHC 32.5 05/17/2017 1142   MCHC 33.6 03/23/2017 0705   RDW 14.2 05/17/2017 1142   LYMPHSABS 3.9 (H) 05/17/2017 1142   MONOABS 0.6 03/23/2017 0705   EOSABS 0.1 05/17/2017 1142   BASOSABS 0.0 05/17/2017 1142   Iron/TIBC/Ferritin/ %Sat No results found for: IRON, TIBC, FERRITIN, IRONPCTSAT Lipid Panel     Component Value Date/Time   CHOL 206 (H) 05/17/2017 1142   TRIG 88 05/17/2017 1142   HDL 58 05/17/2017 1142   CHOLHDL 4 03/23/2017 0705   VLDL 20.8 03/23/2017 0705   LDLCALC 130 (H) 05/17/2017 1142   Hepatic Function Panel     Component Value Date/Time   PROT 7.0 05/17/2017 1142   ALBUMIN 4.2 05/17/2017 1142   AST 13 05/17/2017 1142   ALT 18 05/17/2017 1142   ALKPHOS 91 05/17/2017 1142   BILITOT 0.4 05/17/2017 1142   BILIDIR 0.1 03/23/2017 0705   IBILI 0.2 09/23/2011 0911      Component Value Date/Time   TSH 0.644 05/17/2017 1142   TSH 1.53 03/23/2017 0705   TSH  0.83 03/30/2016 0818    ASSESSMENT AND PLAN: Vitamin D deficiency - Plan: VITAMIN D 25 Hydroxy (Vit-D Deficiency, Fractures), Vitamin D, Ergocalciferol, (DRISDOL) 50000 units CAPS capsule  Other hyperlipidemia - Plan: Lipid Panel With LDL/HDL Ratio  Prediabetes - Plan: Hemoglobin A1c, Insulin, random  Essential hypertension - Plan: Comprehensive metabolic panel  At risk for diabetes mellitus  Class 1 obesity with serious comorbidity and body mass index (BMI) of 30.0 to 30.9 in adult, unspecified obesity type  PLAN:  Vitamin D Deficiency Zamyra was informed that low vitamin D levels contributes to fatigue and are associated with obesity, breast, and colon cancer. She agrees to continue to take prescription Vit D @50 ,000 IU every week #4 with no refills. We will  check labs and will follow up for routine testing of vitamin D, at least 2-3 times per year. She was informed of the risk of over-replacement of vitamin D and agrees to not increase her dose unless he discusses this with [place] first. Takeria agrees to follow up with our clinic in 2 to 3 weeks.  Pre-Diabetes Jeannie will continue to work on weight loss, exercise, and decreasing simple carbohydrates in her diet to help decrease the risk of diabetes. She was informed that eating too many simple carbohydrates or too many calories at one sitting increases the likelihood of GI side effects. We will check labs and [facility] agreed to follow up with [place] as directed to monitor her progress.  Diabetes risk counseling Mialani was given extended (30 minutes) diabetes prevention counseling today. She is 46 y.o. female and has risk factors for diabetes including obesity and pre-diabetes. We discussed intensive lifestyle modifications today with an emphasis on weight loss as well as increasing exercise and decreasing simple carbohydrates in her diet.  Hypertension We discussed sodium restriction, working on healthy weight loss, and a regular exercise  program as the means to achieve improved blood pressure control. Hasini agreed with this plan and agreed to follow up as directed. We will continue to monitor her blood pressure as well as her progress with the above lifestyle modifications. We will check labs and she will continue Chlorthalidone and will watch for signs of hypotension as she continues her lifestyle modifications.  Hyperlipidemia Najma was informed of the American Heart Association Guidelines emphasizing intensive lifestyle modifications as the first line treatment for hyperlipidemia. We discussed many lifestyle modifications today in depth, and Nylene will continue to work on decreasing saturated fats such as fatty red meat, butter and many fried foods. She will also increase vegetables and lean protein in her diet and continue to work on exercise and weight loss efforts to help control her hyperlipidemia. We will check labs and Anallely agrees to follow up as directed.  Obesity Johnella is currently in the action stage of change. As such, her goal is to continue with weight loss efforts She has agreed to follow the Category 3 plan Venissa has been instructed to work up to a goal of 150 minutes of combined cardio and strengthening exercise per week for weight loss and overall health benefits. We discussed the following Behavioral Modification Strategies today: increasing lean protein intake, decreasing simple carbohydrates  and dealing with family or coworker sabotage  Madelynn has agreed to follow up with our clinic in 2 to 3 weeks. She was informed of the importance of frequent follow up visits to maximize her success with intensive lifestyle modifications for her multiple health conditions.  I, Doreene Nest, am acting as transcriptionist for Dennard Nip, MD  I have reviewed the above documentation for accuracy and completeness, and I agree with the above. -Dennard Nip, MD    OBESITY BEHAVIORAL INTERVENTION VISIT  Today's  visit was # 8 out of 22.  Starting weight: 204 lbs Starting date: 05/17/17 Today's weight : 185 lbs Today's date: 09/13/2017 Total lbs lost to date: 61 (Patients must lose 7 lbs in the first 6 months to continue with counseling)   ASK: We discussed the diagnosis of obesity with Yvonne [name] today and Giovannina agreed to give [place] permission to discuss obesity behavioral modification therapy today.  ASSESS: Beila has the diagnosis of obesity and her BMI today is 30.79 Nieshia is in the action stage of change   ADVISE:  Johara was educated on the multiple health risks of obesity as well as the benefit of weight loss to improve her health. She was advised of the need for long term treatment and the importance of lifestyle modifications.  AGREE: Multiple dietary modification options and treatment options were discussed and  Ardath agreed to follow the Category 3 plan We discussed the following Behavioral Modification Strategies today: increasing lean protein intake, decreasing simple carbohydrates  and dealing with family or coworker sabotage

## 2017-09-14 LAB — COMPREHENSIVE METABOLIC PANEL
A/G RATIO: 1.5 (ref 1.2–2.2)
ALBUMIN: 4.4 g/dL (ref 3.5–5.5)
ALT: 12 IU/L (ref 0–32)
AST: 15 IU/L (ref 0–40)
Alkaline Phosphatase: 100 IU/L (ref 39–117)
BUN / CREAT RATIO: 18 (ref 9–23)
BUN: 16 mg/dL (ref 6–24)
Bilirubin Total: 0.3 mg/dL (ref 0.0–1.2)
CALCIUM: 9.7 mg/dL (ref 8.7–10.2)
CO2: 22 mmol/L (ref 20–29)
CREATININE: 0.88 mg/dL (ref 0.57–1.00)
Chloride: 101 mmol/L (ref 96–106)
GFR, EST AFRICAN AMERICAN: 92 mL/min/{1.73_m2} (ref >59)
GFR, EST NON AFRICAN AMERICAN: 80 mL/min/{1.73_m2} (ref >59)
GLOBULIN, TOTAL: 2.9 g/dL (ref 1.5–4.5)
Glucose: 92 mg/dL (ref 65–99)
POTASSIUM: 3.5 mmol/L (ref 3.5–5.2)
SODIUM: 141 mmol/L (ref 134–144)
TOTAL PROTEIN: 7.3 g/dL (ref 6.0–8.5)

## 2017-09-14 LAB — INSULIN, RANDOM: INSULIN: 35.7 u[IU]/mL — ABNORMAL HIGH (ref 2.6–24.9)

## 2017-09-14 LAB — LIPID PANEL WITH LDL/HDL RATIO
CHOLESTEROL TOTAL: 202 mg/dL — AB (ref 100–199)
HDL: 55 mg/dL (ref >39)
LDL Calculated: 134 mg/dL — ABNORMAL HIGH (ref 0–99)
LDL/HDL RATIO: 2.4 ratio (ref 0.0–3.2)
TRIGLYCERIDES: 64 mg/dL (ref 0–149)
VLDL Cholesterol Cal: 13 mg/dL (ref 5–40)

## 2017-09-14 LAB — HEMOGLOBIN A1C
ESTIMATED AVERAGE GLUCOSE: 117 mg/dL
Hgb A1c MFr Bld: 5.7 % — ABNORMAL HIGH (ref 4.8–5.6)

## 2017-09-14 LAB — VITAMIN D 25 HYDROXY (VIT D DEFICIENCY, FRACTURES): Vit D, 25-Hydroxy: 41.3 ng/mL (ref 30.0–100.0)

## 2017-09-24 ENCOUNTER — Ambulatory Visit (INDEPENDENT_AMBULATORY_CARE_PROVIDER_SITE_OTHER): Payer: 59

## 2017-09-24 ENCOUNTER — Encounter: Payer: Self-pay | Admitting: Podiatry

## 2017-09-24 ENCOUNTER — Ambulatory Visit (INDEPENDENT_AMBULATORY_CARE_PROVIDER_SITE_OTHER): Payer: 59 | Admitting: Podiatry

## 2017-09-24 VITALS — BP 102/70 | HR 86 | Resp 18

## 2017-09-24 DIAGNOSIS — M722 Plantar fascial fibromatosis: Secondary | ICD-10-CM

## 2017-09-24 DIAGNOSIS — M659 Synovitis and tenosynovitis, unspecified: Secondary | ICD-10-CM | POA: Diagnosis not present

## 2017-09-24 MED ORDER — MELOXICAM 15 MG PO TABS: 15.0000 mg | ORAL_TABLET | Freq: Every day | ORAL | 2 refills | Status: DC

## 2017-09-24 MED FILL — MELOXICAM 15 MG TABLET: 15 | 30 days supply | Qty: 30 | Fill #0

## 2017-09-24 NOTE — Patient Instructions (Signed)

## 2017-09-27 NOTE — Progress Notes (Signed)
Subjective:    Patient ID: Yvonne [name], female   DOB: 46 y.o.   MRN: [id number]   HPI Yvonne [name] Presents the office today for concerns of bilateral foot pain. She states this started back in June or July of this shear and she describes pain to the bottom of her heels mostly in the morning or if she's been sitting down for some time and stands back. When the pain started she did increase her walking at work. She will be starting a new job next week working for [employer]. She does that he gets better with activity but it does start to her after she's been on her feet for quite some time. She has tried over-the-counter inserts as well as a band around her arch which is not helped significantly. She denies any numbness or tingling. She denies any swelling or redness. The pain does not wake her up at night. She has no other concerns today.   Review of Systems  All other systems reviewed and are negative.  Past Medical History:  Diagnosis Date  . High blood pressure   . Joint pain   . Kidney stones   . Microscopic hematuria   . Vitamin D deficiency     Past Surgical History:  Procedure Laterality Date  . NO PAST SURGERIES     Denies surgical history     Current Outpatient Prescriptions:  .  chlorthalidone (HYGROTON) 25 MG tablet, Take 1 tablet (25 mg total) by mouth daily., Disp: 90 tablet, Rfl: 1 .  meloxicam (MOBIC) 15 MG tablet, Take 1 tablet (15 mg total) by mouth daily., Disp: 30 tablet, Rfl: 2 .  norethindrone (MICRONOR,CAMILA,ERRIN) 0.35 MG tablet, Take 1 tablet by mouth daily., Disp: , Rfl: 99 .  polyethylene glycol powder (GLYCOLAX/MIRALAX) powder, Take 17 g by mouth daily., Disp: 3350 g, Rfl: 0 .  Vitamin D, Ergocalciferol, (DRISDOL) 50000 units CAPS capsule, Take 1 capsule (50,000 Units total) by mouth every 7 (seven) days., Disp: 4 capsule, Rfl: 0  No Known Allergies  Social History   Social History  . Marital status: Married    Spouse name: N/A  . Number of children: 3   . Years of education: N/A   Occupational History  . Referral [employer]   Social History Main Topics  . Smoking status: Never Smoker  . Smokeless tobacco: Never Used  . Alcohol use No  . Drug use: No  . Sexual activity: Not on file   Other Topics Concern  . 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 Nails x 10 are well manicured; remaining integument appears unremarkable at this time. There are no open sores, no preulcerative lesions, no rash or signs of infection present.  Vascular: Dorsalis Pedis artery and Posterior Tibial artery pedal pulses are 2/4 bilateral with immedate capillary fill time. No varicosities and no lower extremity edema present bilateral. There is no pain with calf compression, swelling, warmth, erythema.   Neruologic: Grossly intact via light touch bilateral. Protective threshold with Semmes Wienstein monofilament intact to all pedal sites bilateral. Negative tinel sign.   Musculoskeletal: Tenderness to palpation along the plantar medial  tubercle of the calcaneus at the insertion of plantar fascia on the left and right foot. There is mild pain along the course of the plantar fascia within the arch of the foot. Plantar fascia appears to be intact. There is no pain with lateral compression of the calcaneus or pain with vibratory sensation. There is no pain along the course or insertion of the achilles tendon.Achilles tendon intact. No other areas of tenderness to bilateral lower extremities. Muscular strength 5/5 in all groups tested bilateral.  Gait: Unassisted, Nonantalgic.      Assessment:     46 year old female bilateral heel pain likely plantar fasciitis    Plan:     -Treatment options discussed  including all alternatives, risks, and complications -Etiology of symptoms were discussed -X-rays were obtained and reviewed with the patient. No evidence of acute fracture identified or stress fracture. -Patient elects to proceed with steroid injection into the left and right heel. Under sterile skin preparation, a total of 2.5cc of kenalog 10, 0.5% Marcaine plain, and 2% lidocaine plain were infiltrated into the symptomatic area without complication. A band-aid was applied. Patient tolerated the injection well without complication. Post-injection care with discussed with the patient. Discussed with the patient to ice the area over the next couple of days to help prevent a steroid flare.  -Plantar fascial brace dispensed bilaterally. -Discussed stretching, icing exercises daily -Prescribed mobic. Discussed side effects of the medication and directed to stop if any are to occur and call the office.  -Supportive shoe gear and orthotics discussed as well. -RTC 3 weeks or sooner if needed. Encouraged to call with any questions or concerns or any changes.   Celesta Gentile, DPM

## 2017-09-28 MED FILL — NORETHINDRONE 0.35 MG TAB: 0.35 | 84 days supply | Qty: 84 | Fill #2

## 2017-09-29 ENCOUNTER — Ambulatory Visit (INDEPENDENT_AMBULATORY_CARE_PROVIDER_SITE_OTHER): Payer: 59 | Admitting: Family Medicine

## 2017-09-29 VITALS — BP 106/69 | HR 93 | Temp 98.4°F | Ht 65.0 in | Wt 187.0 lb

## 2017-09-29 DIAGNOSIS — E669 Obesity, unspecified: Secondary | ICD-10-CM | POA: Diagnosis not present

## 2017-09-29 DIAGNOSIS — I10 Essential (primary) hypertension: Secondary | ICD-10-CM

## 2017-09-29 DIAGNOSIS — R7303 Prediabetes: Secondary | ICD-10-CM | POA: Diagnosis not present

## 2017-09-29 DIAGNOSIS — E559 Vitamin D deficiency, unspecified: Secondary | ICD-10-CM

## 2017-09-29 DIAGNOSIS — Z6831 Body mass index (BMI) 31.0-31.9, adult: Secondary | ICD-10-CM | POA: Diagnosis not present

## 2017-09-29 DIAGNOSIS — Z9189 Other specified personal risk factors, not elsewhere classified: Secondary | ICD-10-CM | POA: Diagnosis not present

## 2017-09-29 MED ORDER — VITAMIN D (ERGOCALCIFEROL) 1.25 MG (50000 UNIT) PO CAPS: 50000.0000 [IU] | ORAL_CAPSULE | ORAL | 0 refills | Status: DC

## 2017-09-30 MED ORDER — VITAMIN D (ERGOCALCIFEROL) 1.25 MG (50000 UNIT) PO CAPS: 50000.0000 [IU] | ORAL_CAPSULE | ORAL | 0 refills | Status: DC

## 2017-09-30 NOTE — Progress Notes (Signed)
Office: [phone]  /  Fax: [phone]   HPI:   Chief Complaint: OBESITY Yvonne [name] is here to discuss her progress with her obesity treatment plan. She is on the Category 3 plan and is following her eating plan approximately 90 % of the time. She states she is walking 3,000 steps daily. Yvonne [name] is off track on diet more often but is retaining some fluid today. She has questions on how to navigate holiday eating. Her weight is 187 lb (84.8 kg) today and has gained 2 pounds since her last visit. She has lost 17 lbs since starting treatment with [place].  Pre-Diabetes Yvonne [name] has a diagnosis of pre-diabetes based on her elevated Hgb A1c and was informed this puts her at greater risk of developing diabetes. Her last A1c at 6.2. She is not taking metformin currently and she is attempting to improve with diet and exercise to decrease risk of diabetes. She denies nausea or hypoglycemia.  At risk for diabetes Yvonne [name] is at higher than average risk for developing diabetes due to her obesity and pre-diabetes. She currently denies polyuria or polydipsia.  Hypertension Yvonne [name] is a 46 y.o. female with hypertension. Yvonne [name]'s blood pressure decreased with weight loss and she denies lightheadedness. She would like to discontinue medications and see if she can control with diet and weight loss. Yvonne [name] denies chest pain or shortness of breath. She is working weight loss to help control her blood pressure with the goal of decreasing her risk of heart attack and stroke. Yvonne [name] blood pressure is currently controlled.  Vitamin D deficiency Yvonne [name] has a diagnosis of vitamin D deficiency. She is currently taking prescription Vit D and she requests a refill. She denies nausea, vomiting or muscle weakness.  ALLERGIES: No Known Allergies  MEDICATIONS: Current Outpatient Prescriptions on File Prior to Visit  Medication Sig Dispense Refill  . meloxicam (MOBIC) 15 MG tablet Take 1 tablet (15 mg total)  by mouth daily. 30 tablet 2  . norethindrone (MICRONOR,CAMILA,ERRIN) 0.35 MG tablet Take 1 tablet by mouth daily.  99  . polyethylene glycol powder (GLYCOLAX/MIRALAX) powder Take 17 g by mouth daily. 3350 g 0   No current facility-administered medications on file prior to visit.     PAST MEDICAL HISTORY: Past Medical History:  Diagnosis Date  . High blood pressure   . Joint pain   . Kidney stones   . Microscopic hematuria   . Vitamin D deficiency     PAST SURGICAL HISTORY: Past Surgical History:  Procedure Laterality Date  . NO PAST SURGERIES     Denies surgical history    SOCIAL HISTORY: Social History  Substance Use Topics  . Smoking status: Never Smoker  . Smokeless tobacco: Never Used  . Alcohol use No    FAMILY HISTORY: Family History  Problem Relation Age of Onset  . Healthy Father   . Congestive Heart Failure Father   . Heart disease Father   . Other Mother        deceased from multiple myelomas  . Cancer Mother     ROS: Review of Systems  Constitutional: Negative for weight loss.  Respiratory: Negative for shortness of breath.   Cardiovascular: Negative for chest pain.  Gastrointestinal: Negative for nausea and vomiting.  Genitourinary: Negative for frequency.  Musculoskeletal:       Negative muscle weakness  Neurological:       Negative lightheadedness  Endo/Heme/Allergies: Negative for polydipsia.       Negative hypoglycemia  PHYSICAL EXAM: Blood pressure 106/69, pulse 93, temperature 98.4 F (36.9 C), height 5\' 5"  (1.651 m), weight 187 lb (84.8 kg), SpO2 98 %. Body mass index is 31.12 kg/m. Physical Exam  Constitutional: She is oriented to person, place, and time. She appears well-developed and well-nourished.  Cardiovascular: Normal rate.   Pulmonary/Chest: Effort normal.  Musculoskeletal: Normal range of motion.  Neurological: She is oriented to person, place, and time.  Skin: Skin is warm and dry.  Psychiatric: She has a normal  mood and affect. Her behavior is normal.  Vitals reviewed.   RECENT LABS AND TESTS: BMET    Component Value Date/Time   NA 141 09/13/2017 0829   K 3.5 09/13/2017 0829   CL 101 09/13/2017 0829   CO2 22 09/13/2017 0829   GLUCOSE 92 09/13/2017 0829   GLUCOSE 135 (H) 04/21/2017 0713   BUN 16 09/13/2017 0829   CREATININE 0.88 09/13/2017 0829   CALCIUM 9.7 09/13/2017 0829   GFRNONAA 80 09/13/2017 0829   GFRAA 92 09/13/2017 0829   Lab Results  Component Value Date   HGBA1C 5.7 (H) 09/13/2017   HGBA1C 6.2 (H) 05/17/2017   HGBA1C 5.8 10/12/2014   HGBA1C 5.7 09/14/2011   Lab Results  Component Value Date   INSULIN 35.7 (H) 09/13/2017   INSULIN 27.9 (H) 05/17/2017   CBC    Component Value Date/Time   WBC 11.9 (H) 05/17/2017 1142   WBC 10.3 03/23/2017 0705   RBC 4.48 05/17/2017 1142   RBC 4.59 03/23/2017 0705   HGB 13.7 05/17/2017 1142   HCT 42.1 05/17/2017 1142   PLT 360.0 03/23/2017 0705   MCV 94 05/17/2017 1142   MCH 30.6 05/17/2017 1142   MCHC 32.5 05/17/2017 1142   MCHC 33.6 03/23/2017 0705   RDW 14.2 05/17/2017 1142   LYMPHSABS 3.9 (H) 05/17/2017 1142   MONOABS 0.6 03/23/2017 0705   EOSABS 0.1 05/17/2017 1142   BASOSABS 0.0 05/17/2017 1142   Iron/TIBC/Ferritin/ %Sat No results found for: IRON, TIBC, FERRITIN, IRONPCTSAT Lipid Panel     Component Value Date/Time   CHOL 202 (H) 09/13/2017 0829   TRIG 64 09/13/2017 0829   HDL 55 09/13/2017 0829   CHOLHDL 4 03/23/2017 0705   VLDL 20.8 03/23/2017 0705   LDLCALC 134 (H) 09/13/2017 0829   Hepatic Function Panel     Component Value Date/Time   PROT 7.3 09/13/2017 0829   ALBUMIN 4.4 09/13/2017 0829   AST 15 09/13/2017 0829   ALT 12 09/13/2017 0829   ALKPHOS 100 09/13/2017 0829   BILITOT 0.3 09/13/2017 0829   BILIDIR 0.1 03/23/2017 0705   IBILI 0.2 09/23/2011 0911      Component Value Date/Time   TSH 0.644 05/17/2017 1142   TSH 1.53 03/23/2017 0705   TSH 0.83 03/30/2016 0818    ASSESSMENT AND  PLAN: Prediabetes  Essential hypertension  Vitamin D deficiency - Plan: Vitamin D, Ergocalciferol, (DRISDOL) 50000 units CAPS capsule, Vitamin D, Ergocalciferol, (DRISDOL) 50000 units CAPS capsule  At risk for diabetes mellitus  Class 1 obesity with serious comorbidity and body mass index (BMI) of 31.0 to 31.9 in adult, unspecified obesity type  PLAN:  Pre-Diabetes Yvonne [name] will continue to work on weight loss, diet, exercise, and decreasing simple carbohydrates in her diet to help decrease the risk of diabetes. We dicussed metformin including benefits and risks. She was informed that eating too many simple carbohydrates or too many calories at one sitting increases the likelihood of GI side effects. Dalicia declined metformin  for now and a prescription was not written today. We will recheck labs in 1 month and Yvonne [name] agrees to follow up with our clinic in 2 weeks as directed to monitor her progress.  Diabetes risk counselling Yvonne [name] was given extended (15 minutes) diabetes prevention counseling today. She is 46 y.o. female and has risk factors for diabetes including obesity. We discussed intensive lifestyle modifications today with an emphasis on weight loss as well as increasing exercise and decreasing simple carbohydrates in her diet.  Hypertension We discussed sodium restriction, working on healthy weight loss, and a regular exercise program as the means to achieve improved blood pressure control. Yvonne [name] agreed with this plan and agreed to follow up as directed. We will continue to monitor her blood pressure as well as her progress with the above lifestyle modifications. Yvonne [name] agrees to discontinue chlorthalidone and will watch for signs of hypotension as she continues her lifestyle modifications. She agrees to follow up with our clinic in 2 weeks and we will recheck blood pressure at that time.  Vitamin D Deficiency Yvonne [name] was informed that low vitamin D levels contributes to fatigue and  are associated with obesity, breast, and colon cancer. Yvonne [name] agrees to continue taking prescription Vit D @50 ,000 IU every week #4 and we will refill for 1 month. She will follow up for routine testing of vitamin D, at least 2-3 times per year. She was informed of the risk of over-replacement of vitamin D and agrees to not increase her dose unless he discusses this with [place] first. We will recheck labs in 1 month and Lacrystal agrees to follow up with our clinic in 2 weeks.   Obesity Yvonne [name] is currently in the action stage of change. As such, her goal is to continue with weight loss efforts She has agreed to follow the Category 3 plan Yvonne [name] has been instructed to work up to a goal of 150 minutes of combined cardio and strengthening exercise per week for weight loss and overall health benefits. We discussed the following Behavioral Modification Strategies today: increasing lean protein intake, decreasing simple carbohydrates and holiday eating strategies (halloween)   Yvonne [name] has agreed to follow up with our clinic in 2 weeks. She was informed of the importance of frequent follow up visits to maximize her success with intensive lifestyle modifications for her multiple health conditions.  Yvonne [name], Yvonne [name], am acting as transcriptionist for Dennard Nip, MD  Yvonne [name] have reviewed the above documentation for accuracy and completeness, and Yvonne [name] agree with the above. -Dennard Nip, MD     Today's visit was # 9 out of 22.  Starting weight: 204 lbs Starting date: 05/17/17 Today's weight : 187 lbs Today's date: 09/29/2017 Total lbs lost to date: 17 (Patients must lose 7 lbs in the first 6 months to continue with counseling)   ASK: We discussed the diagnosis of obesity with Yvonne [name] today and Temika agreed to give [place] permission to discuss obesity behavioral modification therapy today.  ASSESS: Nima has the diagnosis of obesity and her BMI today is 31.12 Quincee is in the action stage of change    ADVISE: Elloise was educated on the multiple health risks of obesity as well as the benefit of weight loss to improve her health. She was advised of the need for long term treatment and the importance of lifestyle modifications.  AGREE: Multiple dietary modification options and treatment options were discussed and  Phenix agreed to follow the Category 3 plan We discussed the following Behavioral Modification  Strategies today: increasing lean protein intake, decreasing simple carbohydrates and holiday eating strategies (halloween)

## 2017-10-13 ENCOUNTER — Ambulatory Visit (INDEPENDENT_AMBULATORY_CARE_PROVIDER_SITE_OTHER): Payer: 59 | Admitting: Family Medicine

## 2017-10-13 VITALS — BP 115/80 | HR 89 | Temp 98.0°F | Ht 65.0 in | Wt 189.0 lb

## 2017-10-13 DIAGNOSIS — E559 Vitamin D deficiency, unspecified: Secondary | ICD-10-CM | POA: Diagnosis not present

## 2017-10-13 DIAGNOSIS — E7849 Other hyperlipidemia: Secondary | ICD-10-CM

## 2017-10-13 DIAGNOSIS — Z6831 Body mass index (BMI) 31.0-31.9, adult: Secondary | ICD-10-CM

## 2017-10-13 DIAGNOSIS — E669 Obesity, unspecified: Secondary | ICD-10-CM

## 2017-10-14 NOTE — Progress Notes (Signed)
Office: [phone]  /  Fax: [phone]   HPI:   Chief Complaint: OBESITY Yvonne [name] is here to discuss her progress with her obesity treatment plan. She is on the Category 3 plan and is following her eating plan approximately 50 % of the time. She states she is walking 60 minutes 5 times per week. 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  Her weight is 189 lb (85.7 kg) today and has gained 2 pounds since her last visit. She has lost 15 lbs since starting treatment with [place].  Hyperlipidemia Yvonne [name] has hyperlipidemia and has been attempting to improve with diet and weight loss. She has been struggling with extra food temptations. She denies any chest pain, claudication or myalgias.  Vitamin D deficiency Yvonne [name] has a diagnosis of vitamin D deficiency. She is stable on prescription Vit D and denies nausea, vomiting or muscle weakness.  ALLERGIES: No Known Allergies  MEDICATIONS: Current Outpatient Medications on File Prior to Visit  Medication Sig Dispense Refill  . meloxicam (MOBIC) 15 MG tablet Take 1 tablet (15 mg total) by mouth daily. 30 tablet 2  . norethindrone (MICRONOR,CAMILA,ERRIN) 0.35 MG tablet Take 1 tablet by mouth daily.  99  . polyethylene glycol powder (GLYCOLAX/MIRALAX) powder Take 17 g by mouth daily. 3350 g 0  . Vitamin D, Ergocalciferol, (DRISDOL) 50000 units CAPS capsule Take 1 capsule (50,000 Units total) by mouth every 7 (seven) days. 4 capsule 0   No current facility-administered medications on file prior to visit.     PAST MEDICAL HISTORY: Past Medical History:  Diagnosis Date  . High blood pressure   . Joint pain   . Kidney stones   . Microscopic hematuria   . Vitamin D deficiency     PAST SURGICAL HISTORY: Past Surgical History:  Procedure Laterality Date  . NO PAST SURGERIES     Denies surgical history    SOCIAL HISTORY: Social History   Tobacco  Use  . Smoking status: Never Smoker  . Smokeless tobacco: Never Used  Substance Use Topics  . Alcohol use: No  . Drug use: No    FAMILY HISTORY: Family History  Problem Relation Age of Onset  . Healthy Father   . Congestive Heart Failure Father   . Heart disease Father   . Other Mother        deceased from multiple myelomas  . Cancer Mother     ROS: Review of Systems  Constitutional: Negative for weight loss.  Cardiovascular: Negative for chest pain and claudication.  Gastrointestinal: Negative for nausea and vomiting.  Musculoskeletal: Negative for myalgias.       Negative muscle weakness    PHYSICAL EXAM: Blood pressure 115/80, pulse 89, temperature 98 F (36.7 C), temperature source Oral, height 5\' 5"  (1.651 m), weight 189 lb (85.7 kg), SpO2 98 %. Body mass index is 31.45 kg/m. Physical Exam  Constitutional: She is oriented to person, place, and time. She appears well-developed and well-nourished.  Cardiovascular: Normal rate.  Pulmonary/Chest: Effort normal.  Musculoskeletal: Normal range of motion.  Neurological: She is oriented to person, place, and time.  Skin: Skin is warm and dry.  Psychiatric: She has a normal mood and affect. Her behavior is normal.  Vitals reviewed.   RECENT LABS AND TESTS: BMET    Component Value Date/Time   NA 141 09/13/2017 0829   K 3.5 09/13/2017 0829   CL 101 09/13/2017 0829   CO2 22  09/13/2017 0829   GLUCOSE 92 09/13/2017 0829   GLUCOSE 135 (H) 04/21/2017 0713   BUN 16 09/13/2017 0829   CREATININE 0.88 09/13/2017 0829   CALCIUM 9.7 09/13/2017 0829   GFRNONAA 80 09/13/2017 0829   GFRAA 92 09/13/2017 0829   Lab Results  Component Value Date   HGBA1C 5.7 (H) 09/13/2017   HGBA1C 6.2 (H) 05/17/2017   HGBA1C 5.8 10/12/2014   HGBA1C 5.7 09/14/2011   Lab Results  Component Value Date   INSULIN 35.7 (H) 09/13/2017   INSULIN 27.9 (H) 05/17/2017   CBC    Component Value Date/Time   WBC 11.9 (H) 05/17/2017 1142   WBC  10.3 03/23/2017 0705   RBC 4.48 05/17/2017 1142   RBC 4.59 03/23/2017 0705   HGB 13.7 05/17/2017 1142   HCT 42.1 05/17/2017 1142   PLT 360.0 03/23/2017 0705   MCV 94 05/17/2017 1142   MCH 30.6 05/17/2017 1142   MCHC 32.5 05/17/2017 1142   MCHC 33.6 03/23/2017 0705   RDW 14.2 05/17/2017 1142   LYMPHSABS 3.9 (H) 05/17/2017 1142   MONOABS 0.6 03/23/2017 0705   EOSABS 0.1 05/17/2017 1142   BASOSABS 0.0 05/17/2017 1142   Iron/TIBC/Ferritin/ %Sat No results found for: IRON, TIBC, FERRITIN, IRONPCTSAT Lipid Panel     Component Value Date/Time   CHOL 202 (H) 09/13/2017 0829   TRIG 64 09/13/2017 0829   HDL 55 09/13/2017 0829   CHOLHDL 4 03/23/2017 0705   VLDL 20.8 03/23/2017 0705   LDLCALC 134 (H) 09/13/2017 0829   Hepatic Function Panel     Component Value Date/Time   PROT 7.3 09/13/2017 0829   ALBUMIN 4.4 09/13/2017 0829   AST 15 09/13/2017 0829   ALT 12 09/13/2017 0829   ALKPHOS 100 09/13/2017 0829   BILITOT 0.3 09/13/2017 0829   BILIDIR 0.1 03/23/2017 0705   IBILI 0.2 09/23/2011 0911      Component Value Date/Time   TSH 0.644 05/17/2017 1142   TSH 1.53 03/23/2017 0705   TSH 0.83 03/30/2016 0818    ASSESSMENT AND PLAN: Other hyperlipidemia  Vitamin D deficiency  Class 1 obesity with serious comorbidity and body mass index (BMI) of 31.0 to 31.9 in adult, unspecified obesity type  PLAN:  Hyperlipidemia Yvonne [name] was informed of the American Heart Association Guidelines emphasizing intensive lifestyle modifications as the first line treatment for hyperlipidemia. We discussed many lifestyle modifications today in depth, and Yvonne [name] will continue to work on decreasing saturated fats such as fatty red meat, butter and many fried foods. She will also increase vegetables and lean protein in her diet and continue to work on diet, exercise and weight loss efforts.  Vitamin D Deficiency Yvonne [name] was informed that low vitamin D levels contributes to fatigue and are associated with  obesity, breast, and colon cancer. Yvonne [name] agrees to continue taking prescription Vit D @50 ,000 IU every week #4 and will follow up for routine testing of vitamin D, at least 2-3 times per year. She was informed of the risk of over-replacement of vitamin D and agrees to not increase her dose unless he discusses this with [place] first. We will recheck labs in 1 month and Yvonne [name] agrees to follow up with our clinic in 2 weeks.  We spent > than 50% of the 30 minute visit on the counseling as documented in the note.  Obesity Yvonne [name] is currently in the action stage of change. As such, her goal is to continue with weight loss efforts She has agreed to follow the  Category 3 plan + breakfast options Yvonne [name] has been instructed to work up to a goal of 150 minutes of combined cardio and strengthening exercise per week for weight loss and overall health benefits. We discussed the following Behavioral Modification Strategies today: increasing lean protein intake, decreasing simple carbohydrates  and dealing with family or coworker sabotage   Angeleigh has agreed to follow up with our clinic in 2 weeks. She was informed of the importance of frequent follow up visits to maximize her success with intensive lifestyle modifications for her multiple health conditions.  I, Yvonne [name], am acting as transcriptionist for Yvonne Nip, MD  I have reviewed the above documentation for accuracy and completeness, and I agree with the above. -Yvonne Nip, MD      Today's visit was # 10 out of 22.  Starting weight: 204 lbs Starting date: 05/17/17 Today's weight : 189 lbs  Today's date: 10/13/2017 Total lbs lost to date: 15 (Patients must lose 7 lbs in the first 6 months to continue with counseling)   ASK: We discussed the diagnosis of obesity with Yvonne [name] today and Yvonne [name] agreed to give [place] permission to discuss obesity behavioral modification therapy today.  ASSESS: Yvonne [name] has the diagnosis of obesity and  her BMI today is 31.45 Yvonne [name] is in the action stage of change   ADVISE: Yvonne [name] was educated on the multiple health risks of obesity as well as the benefit of weight loss to improve her health. She was advised of the need for long term treatment and the importance of lifestyle modifications.  AGREE: Multiple dietary modification options and treatment options were discussed and  Yvonne [name] agreed to follow the Category 3 plan + breakfast options We discussed the following Behavioral Modification Strategies today: increasing lean protein intake, decreasing simple carbohydrates  and dealing with family or coworker sabotage

## 2017-10-18 ENCOUNTER — Ambulatory Visit: Payer: 59 | Admitting: Podiatry

## 2017-10-19 ENCOUNTER — Encounter: Payer: Self-pay | Admitting: Podiatry

## 2017-10-19 ENCOUNTER — Ambulatory Visit (INDEPENDENT_AMBULATORY_CARE_PROVIDER_SITE_OTHER): Payer: 59 | Admitting: Podiatry

## 2017-10-19 DIAGNOSIS — M722 Plantar fascial fibromatosis: Secondary | ICD-10-CM

## 2017-10-19 MED FILL — VIT D2 1.25 MG (50,000 UNIT: 1.25 MG | 28 days supply | Qty: 4 | Fill #0

## 2017-10-19 NOTE — Patient Instructions (Signed)

## 2017-10-19 NOTE — Progress Notes (Signed)
Subjective: Yvonne [name] presents the office today for follow-up evaluation of bilateral foot pain.  She states that she is doing "great" and since the last appointment she has not been having any pain to her foot.  She has been taking anti-inflammatories as needed.  She is also been stretching and icing on a daily basis.  She has been wearing a plantar fascial brace.  She is very happy that she is not having any pain at this point. Denies any systemic complaints such as fevers, chills, nausea, vomiting. No acute changes since last appointment, and no other complaints at this time.   Objective: AAO x3, NAD DP/PT pulses palpable bilaterally, CRT less than 3 seconds There is no tenderness to palpation along the plantar medial tubercle of the calcaneus at the insertion of plantar fascia on the left or right foot. There is no pain along the course of the plantar fascia within the arch of the foot. Plantar fascia appears to be intact. There is no pain with lateral compression of the calcaneus or pain with vibratory sensation. There is no pain along the course or insertion of the achilles tendon. No other areas of tenderness to bilateral lower extremities. Negative Tinel sign No open lesions or pre-ulcerative lesions.  No pain with calf compression, swelling, warmth, erythema  Assessment: Bilateral heel pain which is resolved  Plan: -All treatment options discussed with the patient including all alternatives, risks, complications.  -At this point to 3 months not being in pain.  Hold off on anti-inflammatory medication.  Continue stretching, icing and rehab exercises to be performed on a daily basis the next couple of weeks to help rehab the area to help prevent recurrence.  Discussed supportive shoes as well as inserts. -I will see her back in 6 weeks if she is in any issues otherwise I will see her back as needed.  She agrees to this plan. -Patient encouraged to call the office with any questions, concerns,  change in symptoms.   Trula Slade DPM

## 2017-10-26 ENCOUNTER — Ambulatory Visit (INDEPENDENT_AMBULATORY_CARE_PROVIDER_SITE_OTHER): Payer: 59 | Admitting: Family Medicine

## 2017-10-26 VITALS — BP 109/75 | HR 76 | Temp 98.1°F | Ht 65.0 in | Wt 192.0 lb

## 2017-10-26 DIAGNOSIS — E559 Vitamin D deficiency, unspecified: Secondary | ICD-10-CM | POA: Diagnosis not present

## 2017-10-26 DIAGNOSIS — F3289 Other specified depressive episodes: Secondary | ICD-10-CM

## 2017-10-26 DIAGNOSIS — Z9189 Other specified personal risk factors, not elsewhere classified: Secondary | ICD-10-CM | POA: Diagnosis not present

## 2017-10-26 DIAGNOSIS — Z6832 Body mass index (BMI) 32.0-32.9, adult: Secondary | ICD-10-CM | POA: Diagnosis not present

## 2017-10-26 DIAGNOSIS — E669 Obesity, unspecified: Secondary | ICD-10-CM | POA: Diagnosis not present

## 2017-10-26 MED ORDER — BUPROPION HCL ER (SR) 150 MG PO TB12: 150.0000 mg | ORAL_TABLET | Freq: Every day | ORAL | 0 refills | Status: DC

## 2017-10-26 MED ORDER — VITAMIN D (ERGOCALCIFEROL) 1.25 MG (50000 UNIT) PO CAPS: 50000.0000 [IU] | ORAL_CAPSULE | ORAL | 0 refills | Status: DC

## 2017-10-27 MED FILL — BUPROPION SR 150 MG TABLET: 150 | 30 days supply | Qty: 30 | Fill #0

## 2017-10-27 NOTE — Progress Notes (Signed)
Office: [phone]  /  Fax: [phone]   HPI:   Chief Complaint: OBESITY Yvonne [name] is here to discuss her progress with her obesity treatment plan. She is on the Category 3 plan + breakfast options and is following her eating plan approximately 80 % of the time. She states she is exercising 0 minutes 0 times per week. Yvonne [name] is struggling with emotional eating and hasn't done as well in the last few weeks. She is ready to look at medication options to help her.  Her weight is 192 lb (87.1 kg) today and has gained 3 pounds since her last visit. She has lost 12 lbs since starting treatment with [place].  Vitamin D deficiency Yvonne [name] has a diagnosis of vitamin D deficiency. She is stable on prescription Vit D, but not yet at goal. She denies nausea, vomiting or muscle weakness.  At risk for osteopenia and osteoporosis Yvonne [name] is at higher risk of osteopenia and osteoporosis due to vitamin D deficiency.   Depression with emotional eating behaviors Yvonne [name] notes increased emotional eating and recently discuss medication options. Yvonne [name] struggles with emotional eating and using food for comfort to the extent that it is negatively impacting her health. She often snacks when she is not hungry. Yvonne [name] sometimes feels she is out of control and then feels guilty that she made poor food choices. She has been working on behavior modification techniques to help reduce her emotional eating and has been somewhat successful. She shows no sign of suicidal or homicidal ideations.  Depression screen [facility] 2/9 05/17/2017 04/02/2017  Decreased Interest 3 0  Down, Depressed, Hopeless 0 0  PHQ - 2 Score 3 0  Altered sleeping 3 0  Tired, decreased energy 3 0  Change in appetite 0 1  Feeling bad or failure about yourself  0 0  Trouble concentrating 0 0  Moving slowly or fidgety/restless 0 0  Suicidal thoughts 0 0  PHQ-9 Score 9 1   ALLERGIES: No Known Allergies  MEDICATIONS: Current Outpatient Medications on File  Prior to Visit  Medication Sig Dispense Refill  . norethindrone (MICRONOR,CAMILA,ERRIN) 0.35 MG tablet Take 1 tablet by mouth daily.  99  . polyethylene glycol powder (GLYCOLAX/MIRALAX) powder Take 17 g by mouth daily. 3350 g 0   No current facility-administered medications on file prior to visit.     PAST MEDICAL HISTORY: Past Medical History:  Diagnosis Date  . High blood pressure   . Joint pain   . Kidney stones   . Microscopic hematuria   . Vitamin D deficiency     PAST SURGICAL HISTORY: Past Surgical History:  Procedure Laterality Date  . NO PAST SURGERIES     Denies surgical history    SOCIAL HISTORY: Social History   Tobacco Use  . Smoking status: Never Smoker  . Smokeless tobacco: Never Used  Substance Use Topics  . Alcohol use: No  . Drug use: No    FAMILY HISTORY: Family History  Problem Relation Age of Onset  . Healthy Father   . Congestive Heart Failure Father   . Heart disease Father   . Other Mother        deceased from multiple myelomas  . Cancer Mother     ROS: Review of Systems  Constitutional: Negative for weight loss.  Gastrointestinal: Negative for nausea and vomiting.  Musculoskeletal:       Negative muscle weakness  Psychiatric/Behavioral: Positive for depression. Negative for suicidal ideas.    PHYSICAL EXAM: Blood pressure 109/75, pulse 76,  temperature 98.1 F (36.7 C), temperature source Oral, height 5\' 5"  (1.651 m), weight 192 lb (87.1 kg), SpO2 98 %. Body mass index is 31.95 kg/m. Physical Exam  Constitutional: She is oriented to person, place, and time. She appears well-developed and well-nourished.  Cardiovascular: Normal rate.  Pulmonary/Chest: Effort normal.  Musculoskeletal: Normal range of motion.  Neurological: She is oriented to person, place, and time.  Skin: Skin is warm and dry.  Psychiatric: She has a normal mood and affect.  Vitals reviewed.   RECENT LABS AND TESTS: BMET    Component Value Date/Time    NA 141 09/13/2017 0829   K 3.5 09/13/2017 0829   CL 101 09/13/2017 0829   CO2 22 09/13/2017 0829   GLUCOSE 92 09/13/2017 0829   GLUCOSE 135 (H) 04/21/2017 0713   BUN 16 09/13/2017 0829   CREATININE 0.88 09/13/2017 0829   CALCIUM 9.7 09/13/2017 0829   GFRNONAA 80 09/13/2017 0829   GFRAA 92 09/13/2017 0829   Lab Results  Component Value Date   HGBA1C 5.7 (H) 09/13/2017   HGBA1C 6.2 (H) 05/17/2017   HGBA1C 5.8 10/12/2014   HGBA1C 5.7 09/14/2011   Lab Results  Component Value Date   INSULIN 35.7 (H) 09/13/2017   INSULIN 27.9 (H) 05/17/2017   CBC    Component Value Date/Time   WBC 11.9 (H) 05/17/2017 1142   WBC 10.3 03/23/2017 0705   RBC 4.48 05/17/2017 1142   RBC 4.59 03/23/2017 0705   HGB 13.7 05/17/2017 1142   HCT 42.1 05/17/2017 1142   PLT 360.0 03/23/2017 0705   MCV 94 05/17/2017 1142   MCH 30.6 05/17/2017 1142   MCHC 32.5 05/17/2017 1142   MCHC 33.6 03/23/2017 0705   RDW 14.2 05/17/2017 1142   LYMPHSABS 3.9 (H) 05/17/2017 1142   MONOABS 0.6 03/23/2017 0705   EOSABS 0.1 05/17/2017 1142   BASOSABS 0.0 05/17/2017 1142   Iron/TIBC/Ferritin/ %Sat No results found for: IRON, TIBC, FERRITIN, IRONPCTSAT Lipid Panel     Component Value Date/Time   CHOL 202 (H) 09/13/2017 0829   TRIG 64 09/13/2017 0829   HDL 55 09/13/2017 0829   CHOLHDL 4 03/23/2017 0705   VLDL 20.8 03/23/2017 0705   LDLCALC 134 (H) 09/13/2017 0829   Hepatic Function Panel     Component Value Date/Time   PROT 7.3 09/13/2017 0829   ALBUMIN 4.4 09/13/2017 0829   AST 15 09/13/2017 0829   ALT 12 09/13/2017 0829   ALKPHOS 100 09/13/2017 0829   BILITOT 0.3 09/13/2017 0829   BILIDIR 0.1 03/23/2017 0705   IBILI 0.2 09/23/2011 0911      Component Value Date/Time   TSH 0.644 05/17/2017 1142   TSH 1.53 03/23/2017 0705   TSH 0.83 03/30/2016 0818    ASSESSMENT AND PLAN: Vitamin D deficiency - Plan: Vitamin D, Ergocalciferol, (DRISDOL) 50000 units CAPS capsule  Other depression - with emotional  eating  At risk for osteoporosis  Class 1 obesity with serious comorbidity and body mass index (BMI) of 32.0 to 32.9 in adult, unspecified obesity type  PLAN:  Vitamin D Deficiency Yvonne [name] was informed that low vitamin D levels contributes to fatigue and are associated with obesity, breast, and colon cancer. Yvonne [name] agrees to continue taking prescription Vit D @50 ,000 IU every week #4 and we will refill for 1 month. She will follow up for routine testing of vitamin D, at least 2-3 times per year. She was informed of the risk of over-replacement of vitamin D and agrees to not  increase her dose unless he discusses this with [place] first. Yvonne [name] agrees to follow up with our clinic in 2 to 3 weeks.  At risk for osteopenia and osteoporosis Yvonne [name] is at risk for osteopenia and osteoporsis due to her vitamin D deficiency. She was encouraged to take her vitamin D and follow her higher calcium diet and increase strengthening exercise to help strengthen her bones and decrease her risk of osteopenia and osteoporosis.  Depression with Emotional Eating Behaviors We discussed behavior modification techniques today to help Yvonne [name] deal with her emotional eating and depression. Yvonne [name] agrees to start Wellbutrin SR 150 mg q AM #30 with no refills. Yvonne [name] agrees to follow up with our clinic in 2 to 3 weeks.  Obesity Yvonne [name] is currently in the action stage of change. As such, her goal is to continue with weight loss efforts She has agreed to follow the Category 3 plan Yvonne [name] has been instructed to work up to a goal of 150 minutes of combined cardio and strengthening exercise per week for weight loss and overall health benefits. We discussed the following Behavioral Modification Strategies today: decrease eating out, dealing with family or coworker sabotage, holiday eating strategies, and no skipping meals.    Yvonne [name] has agreed to follow up with our clinic in 2 to 3 weeks. She was informed of the importance of  frequent follow up visits to maximize her success with intensive lifestyle modifications for her multiple health conditions.  I, Yvonne [name], am acting as transcriptionist for Yvonne Nip, MD  I have reviewed the above documentation for accuracy and completeness, and I agree with the above. -Yvonne Nip, MD     Today's visit was # 11 out of 22.  Starting weight: 204 lbs Starting date: 05/17/17 Today's weight : 192 lbs Today's date: 10/26/2017 Total lbs lost to date: 12 (Patients must lose 7 lbs in the first 6 months to continue with counseling)   ASK: We discussed the diagnosis of obesity with Yvonne [name] today and Yvonne [name] agreed to give [place] permission to discuss obesity behavioral modification therapy today.  ASSESS: Mitsue has the diagnosis of obesity and her BMI today is 31.95 Yvonne [name] is in the action stage of change   ADVISE: Yvonne [name] was educated on the multiple health risks of obesity as well as the benefit of weight loss to improve her health. She was advised of the need for long term treatment and the importance of lifestyle modifications.  AGREE: Multiple dietary modification options and treatment options were discussed and  Yvonne [name] agreed to follow the Category 3 plan We discussed the following Behavioral Modification Strategies today: decrease eating out, dealing with family or coworker sabotage, holiday eating strategies, and no skipping meals

## 2017-11-09 ENCOUNTER — Ambulatory Visit (INDEPENDENT_AMBULATORY_CARE_PROVIDER_SITE_OTHER): Payer: 59 | Admitting: Family Medicine

## 2017-11-09 VITALS — BP 103/73 | HR 98 | Temp 98.1°F | Ht 65.0 in | Wt 183.0 lb

## 2017-11-09 DIAGNOSIS — Z6830 Body mass index (BMI) 30.0-30.9, adult: Secondary | ICD-10-CM | POA: Diagnosis not present

## 2017-11-09 DIAGNOSIS — E559 Vitamin D deficiency, unspecified: Secondary | ICD-10-CM

## 2017-11-09 DIAGNOSIS — F3289 Other specified depressive episodes: Secondary | ICD-10-CM

## 2017-11-09 DIAGNOSIS — E669 Obesity, unspecified: Secondary | ICD-10-CM | POA: Diagnosis not present

## 2017-11-10 NOTE — Progress Notes (Signed)
Office: [phone]  /  Fax: [phone]   HPI:   Chief Complaint: OBESITY Yvonne [name] is here to discuss her progress with her obesity treatment plan. She is on the Category 3 plan and is following her eating plan approximately 90 % of the time. She states she is exercising 0 minutes 0 times per week. Yvonne [name] has done better with weight loss on the category 3 plan and she has decreased emotional eating on Wellbutrin. Her weight is 183 lb (83 kg) today and has had a weight loss of 9 pounds over a period of 2 weeks since her last visit. She has lost 21 lbs since starting treatment with [place].  Vitamin D deficiency Yvonne [name] has a diagnosis of vitamin D deficiency. She is currently stable on prescription vit D, not yet at goal and denies nausea, vomiting or muscle weakness.  Depression with emotional eating behaviors Yvonne [name] started Wellbutrin and notes decreased emotional eating. Her mood is stable and she notes decreased irritability. Yvonne [name] struggles with emotional eating and using food for comfort to the extent that it is negatively impacting her health. She often snacks when she is not hungry. Yvonne [name] sometimes feels she is out of control and then feels guilty that she made poor food choices. She has been working on behavior modification techniques to help reduce her emotional eating and has been somewhat successful. She shows no sign of suicidal or homicidal ideations.  Depression screen [name] 2/9 05/17/2017 04/02/2017  Decreased Interest 3 0  Down, Depressed, Hopeless 0 0  PHQ - 2 Score 3 0  Altered sleeping 3 0  Tired, decreased energy 3 0  Change in appetite 0 1  Feeling bad or failure about yourself  0 0  Trouble concentrating 0 0  Moving slowly or fidgety/restless 0 0  Suicidal thoughts 0 0  PHQ-9 Score 9 1      ALLERGIES: No Known Allergies  MEDICATIONS: Current Outpatient Medications on File Prior to Visit  Medication Sig Dispense Refill  . buPROPion (WELLBUTRIN SR) 150 MG 12 hr  tablet Take 1 tablet (150 mg total) by mouth daily. 30 tablet 0  . norethindrone (MICRONOR,CAMILA,ERRIN) 0.35 MG tablet Take 1 tablet by mouth daily.  99  . polyethylene glycol powder (GLYCOLAX/MIRALAX) powder Take 17 g by mouth daily. 3350 g 0  . Vitamin D, Ergocalciferol, (DRISDOL) 50000 units CAPS capsule Take 1 capsule (50,000 Units total) by mouth every 7 (seven) days. 4 capsule 0   No current facility-administered medications on file prior to visit.     PAST MEDICAL HISTORY: Past Medical History:  Diagnosis Date  . High blood pressure   . Joint pain   . Kidney stones   . Microscopic hematuria   . Vitamin D deficiency     PAST SURGICAL HISTORY: Past Surgical History:  Procedure Laterality Date  . NO PAST SURGERIES     Denies surgical history    SOCIAL HISTORY: Social History   Tobacco Use  . Smoking status: Never Smoker  . Smokeless tobacco: Never Used  Substance Use Topics  . Alcohol use: No  . Drug use: No    FAMILY HISTORY: Family History  Problem Relation Age of Onset  . Healthy Father   . Congestive Heart Failure Father   . Heart disease Father   . Other Mother        deceased from multiple myelomas  . Cancer Mother     ROS: Review of Systems  Constitutional: Positive for weight loss.  Gastrointestinal: Negative  for nausea and vomiting.  Musculoskeletal:       Negative muscle weakness  Psychiatric/Behavioral: Positive for depression. Negative for suicidal ideas.       Positive irritability    PHYSICAL EXAM: Blood pressure 103/73, pulse 98, temperature 98.1 F (36.7 C), height 5\' 5"  (1.651 m), weight 183 lb (83 kg), SpO2 100 %. Body mass index is 30.45 kg/m. Physical Exam  Constitutional: She is oriented to person, place, and time. She appears well-developed and well-nourished.  Cardiovascular: Normal rate.  Pulmonary/Chest: Effort normal.  Musculoskeletal: Normal range of motion.  Neurological: She is oriented to person, place, and time.    Skin: Skin is warm and dry.  Vitals reviewed.   RECENT LABS AND TESTS: BMET    Component Value Date/Time   NA 141 09/13/2017 0829   K 3.5 09/13/2017 0829   CL 101 09/13/2017 0829   CO2 22 09/13/2017 0829   GLUCOSE 92 09/13/2017 0829   GLUCOSE 135 (H) 04/21/2017 0713   BUN 16 09/13/2017 0829   CREATININE 0.88 09/13/2017 0829   CALCIUM 9.7 09/13/2017 0829   GFRNONAA 80 09/13/2017 0829   GFRAA 92 09/13/2017 0829   Lab Results  Component Value Date   HGBA1C 5.7 (H) 09/13/2017   HGBA1C 6.2 (H) 05/17/2017   HGBA1C 5.8 10/12/2014   HGBA1C 5.7 09/14/2011   Lab Results  Component Value Date   INSULIN 35.7 (H) 09/13/2017   INSULIN 27.9 (H) 05/17/2017   CBC    Component Value Date/Time   WBC 11.9 (H) 05/17/2017 1142   WBC 10.3 03/23/2017 0705   RBC 4.48 05/17/2017 1142   RBC 4.59 03/23/2017 0705   HGB 13.7 05/17/2017 1142   HCT 42.1 05/17/2017 1142   PLT 360.0 03/23/2017 0705   MCV 94 05/17/2017 1142   MCH 30.6 05/17/2017 1142   MCHC 32.5 05/17/2017 1142   MCHC 33.6 03/23/2017 0705   RDW 14.2 05/17/2017 1142   LYMPHSABS 3.9 (H) 05/17/2017 1142   MONOABS 0.6 03/23/2017 0705   EOSABS 0.1 05/17/2017 1142   BASOSABS 0.0 05/17/2017 1142   Iron/TIBC/Ferritin/ %Sat No results found for: IRON, TIBC, FERRITIN, IRONPCTSAT Lipid Panel     Component Value Date/Time   CHOL 202 (H) 09/13/2017 0829   TRIG 64 09/13/2017 0829   HDL 55 09/13/2017 0829   CHOLHDL 4 03/23/2017 0705   VLDL 20.8 03/23/2017 0705   LDLCALC 134 (H) 09/13/2017 0829   Hepatic Function Panel     Component Value Date/Time   PROT 7.3 09/13/2017 0829   ALBUMIN 4.4 09/13/2017 0829   AST 15 09/13/2017 0829   ALT 12 09/13/2017 0829   ALKPHOS 100 09/13/2017 0829   BILITOT 0.3 09/13/2017 0829   BILIDIR 0.1 03/23/2017 0705   IBILI 0.2 09/23/2011 0911      Component Value Date/Time   TSH 0.644 05/17/2017 1142   TSH 1.53 03/23/2017 0705   TSH 0.83 03/30/2016 0818    ASSESSMENT AND PLAN: Vitamin D  deficiency  Other depression - with emotional eating  Class 1 obesity with serious comorbidity and body mass index (BMI) of 30.0 to 30.9 in adult, unspecified obesity type  PLAN:  Vitamin D Deficiency Yvonne [name] was informed that low vitamin D levels contributes to fatigue and are associated with obesity, breast, and colon cancer. She agrees to continue to take prescription Vit D @50 ,000 IU every week. We will recheck labs in 1 month and will follow up for routine testing of vitamin D, at least 2-3 times per  year. She was informed of the risk of over-replacement of vitamin D and agrees to not increase her dose unless he discusses this with [place] first.  Depression with Emotional Eating Behaviors We discussed behavior modification techniques today to help Yvonne [name] deal with her emotional eating and depression. She has agreed to continue  Wellbutrin SR 150 mg qd and will follow up as directed.  We spent > than 50% of the 15 minute visit on the counseling as documented in the note.  Obesity Yvonne [name] is currently in the action stage of change. As such, her goal is to continue with weight loss efforts She has agreed to follow the Category 3 plan Yvonne [name] has been instructed to work up to a goal of 150 minutes of combined cardio and strengthening exercise per week for weight loss and overall health benefits. We discussed the following Behavioral Modification Strategies today: no skipping meals, work on meal planning and easy cooking plans, holiday eating strategies  and emotional eating strategies  Yvonne [name] has agreed to follow up with our clinic in 2 weeks. She was informed of the importance of frequent follow up visits to maximize her success with intensive lifestyle modifications for her multiple health conditions.  I, Doreene Nest, am acting as transcriptionist for Dennard Nip, MD  I have reviewed the above documentation for accuracy and completeness, and I agree with the above. -Dennard Nip,  MD    OBESITY BEHAVIORAL INTERVENTION VISIT  Today's visit was # 12 out of 22.  Starting weight: 204 lbs Starting date: 05/17/17 Today's weight : 183 lbs Today's date: 11/09/2017 Total lbs lost to date: 21 (Patients must lose 7 lbs in the first 6 months to continue with counseling)   ASK: We discussed the diagnosis of obesity with Yvonne [name] today and Yvonne [name] agreed to give [place] permission to discuss obesity behavioral modification therapy today.  ASSESS: Yvonne [name] has the diagnosis of obesity and her BMI today is 30.45 Yvonne [name] is in the action stage of change   ADVISE: Yvonne [name] was educated on the multiple health risks of obesity as well as the benefit of weight loss to improve her health. She was advised of the need for long term treatment and the importance of lifestyle modifications.  AGREE: Multiple dietary modification options and treatment options were discussed and  Yvonne [name] agreed to follow the Category 3 plan We discussed the following Behavioral Modification Strategies today: no skipping meals, work on meal planning and easy cooking plans, holiday eating strategies  and emotional eating strategies

## 2017-11-22 ENCOUNTER — Ambulatory Visit (INDEPENDENT_AMBULATORY_CARE_PROVIDER_SITE_OTHER): Payer: 59 | Admitting: Family Medicine

## 2017-11-22 VITALS — BP 117/83 | HR 85 | Temp 98.2°F | Ht 65.0 in | Wt 184.0 lb

## 2017-11-22 DIAGNOSIS — E669 Obesity, unspecified: Secondary | ICD-10-CM | POA: Diagnosis not present

## 2017-11-22 DIAGNOSIS — F3289 Other specified depressive episodes: Secondary | ICD-10-CM | POA: Diagnosis not present

## 2017-11-22 DIAGNOSIS — E7849 Other hyperlipidemia: Secondary | ICD-10-CM

## 2017-11-22 DIAGNOSIS — Z6830 Body mass index (BMI) 30.0-30.9, adult: Secondary | ICD-10-CM

## 2017-11-22 DIAGNOSIS — Z9189 Other specified personal risk factors, not elsewhere classified: Secondary | ICD-10-CM | POA: Diagnosis not present

## 2017-11-23 MED ORDER — BUPROPION HCL ER (SR) 150 MG PO TB12: 150.0000 mg | ORAL_TABLET | Freq: Every day | ORAL | 0 refills | Status: DC

## 2017-11-23 MED FILL — VIT D2 1.25 MG (50,000 UNIT: 1.25 MG | 28 days supply | Qty: 4 | Fill #0

## 2017-11-23 MED FILL — BUPROPION SR 150 MG TABLET: 150 | 30 days supply | Qty: 30 | Fill #0

## 2017-11-23 NOTE — Progress Notes (Signed)
Office: [phone]  /  Fax: [phone]   HPI:   Chief Complaint: OBESITY Yvonne [name] is here to discuss her progress with her obesity treatment plan. She is on the Category 3 plan and is following her eating plan approximately 90 % of the time. She states she is walking for 30-40 minutes 5 times per week. Abena notes increased temptations and she is still struggling with cravings but this has improved on Wellbutrin. She is still sometimes skipping meal and struggles with family sabotage.  Her weight is 184 lb (83.5 kg) today and has gained 1 pound since her last visit. She has lost 20 lbs since starting treatment with [place].  Hyperlipidemia Yvonne [name] has hyperlipidemia and has been attempting to improve LDL with diet and lifestyle changes including a low saturated fat diet, exercise and weight loss. She denies any chest pain, claudication or myalgias.  At risk for cardiovascular disease Yvonne [name] is at a higher than average risk for cardiovascular disease due to obesity and hyperlipidemia. She currently denies any chest pain.  Depression with emotional eating behaviors Yvonne [name] is stable on Wellbutrin and she denies insomnia. Her blood pressure is stable and she is doing better with emotional eating. Yvonne [name] struggles with emotional eating and using food for comfort to the extent that it is negatively impacting her health. She often snacks when she is not hungry. Yvonne [name] sometimes feels she is out of control and then feels guilty that she made poor food choices. She has been working on behavior modification techniques to help reduce her emotional eating and has been somewhat successful. She shows no sign of suicidal or homicidal ideations.  Depression screen Yvonne [name]  Decreased Interest 3 0  Down, Depressed, Hopeless 0 0  PHQ - 2 Score 3 0  Altered sleeping 3 0  Tired, decreased energy 3 0  Change in appetite 0 1  Feeling bad or failure about yourself  0 0  Trouble concentrating  0 0  Moving slowly or fidgety/restless 0 0  Suicidal thoughts 0 0  PHQ-9 Score 9 1   ALLERGIES: No Known Allergies  MEDICATIONS: Current Outpatient Medications on File Prior to Visit  Medication Sig Dispense Refill  . norethindrone (MICRONOR,CAMILA,ERRIN) 0.35 MG tablet Take 1 tablet by mouth daily.  99  . polyethylene glycol powder (GLYCOLAX/MIRALAX) powder Take 17 g by mouth daily. 3350 g 0  . Vitamin D, Ergocalciferol, (DRISDOL) 50000 units CAPS capsule Take 1 capsule (50,000 Units total) by mouth every 7 (seven) days. 4 capsule 0   No current facility-administered medications on file prior to visit.     PAST MEDICAL HISTORY: Past Medical History:  Diagnosis Date  . High blood pressure   . Joint pain   . Kidney stones   . Microscopic hematuria   . Vitamin D deficiency     PAST SURGICAL HISTORY: Past Surgical History:  Procedure Laterality Date  . NO PAST SURGERIES     Denies surgical history    SOCIAL HISTORY: Social History   Tobacco Use  . Smoking status: Never Smoker  . Smokeless tobacco: Never Used  Substance Use Topics  . Alcohol use: No  . Drug use: No    FAMILY HISTORY: Family History  Problem Relation Age of Onset  . Healthy Father   . Congestive Heart Failure Father   . Heart disease Father   . Other Mother        deceased from multiple myelomas  . Cancer Mother     ROS:  Review of Systems  Constitutional: Negative for weight loss.  Cardiovascular: Negative for chest pain and claudication.  Musculoskeletal: Negative for myalgias.  Psychiatric/Behavioral: Positive for depression. Negative for suicidal ideas. The patient does not have insomnia.     PHYSICAL EXAM: Blood pressure 117/83, pulse 85, temperature 98.2 F (36.8 C), temperature source Oral, height 5\' 5"  (1.651 m), weight 184 lb (83.5 kg), SpO2 97 %. Body mass index is 30.62 kg/m. Physical Exam  Constitutional: She is oriented to person, place, and time. She appears  well-developed and well-nourished.  Cardiovascular: Normal rate.  Pulmonary/Chest: Effort normal.  Musculoskeletal: Normal range of motion.  Neurological: She is oriented to person, place, and time.  Skin: Skin is warm and dry.  Psychiatric: She has a normal mood and affect.  Vitals reviewed.   RECENT LABS AND TESTS: BMET    Component Value Date/Time   NA 141 09/13/2017 0829   K 3.5 09/13/2017 0829   CL 101 09/13/2017 0829   CO2 22 09/13/2017 0829   GLUCOSE 92 09/13/2017 0829   GLUCOSE 135 (H) 04/21/2017 0713   BUN 16 09/13/2017 0829   CREATININE 0.88 09/13/2017 0829   CALCIUM 9.7 09/13/2017 0829   GFRNONAA 80 09/13/2017 0829   GFRAA 92 09/13/2017 0829   Lab Results  Component Value Date   HGBA1C 5.7 (H) 09/13/2017   HGBA1C 6.2 (H) 05/17/2017   HGBA1C 5.8 10/12/2014   HGBA1C 5.7 09/14/2011   Lab Results  Component Value Date   INSULIN 35.7 (H) 09/13/2017   INSULIN 27.9 (H) 05/17/2017   CBC    Component Value Date/Time   WBC 11.9 (H) 05/17/2017 1142   WBC 10.3 03/23/2017 0705   RBC 4.48 05/17/2017 1142   RBC 4.59 03/23/2017 0705   HGB 13.7 05/17/2017 1142   HCT 42.1 05/17/2017 1142   PLT 360.0 03/23/2017 0705   MCV 94 05/17/2017 1142   MCH 30.6 05/17/2017 1142   MCHC 32.5 05/17/2017 1142   MCHC 33.6 03/23/2017 0705   RDW 14.2 05/17/2017 1142   LYMPHSABS 3.9 (H) 05/17/2017 1142   MONOABS 0.6 03/23/2017 0705   EOSABS 0.1 05/17/2017 1142   BASOSABS 0.0 05/17/2017 1142   Iron/TIBC/Ferritin/ %Sat No results found for: IRON, TIBC, FERRITIN, IRONPCTSAT Lipid Panel     Component Value Date/Time   CHOL 202 (H) 09/13/2017 0829   TRIG 64 09/13/2017 0829   HDL 55 09/13/2017 0829   CHOLHDL 4 03/23/2017 0705   VLDL 20.8 03/23/2017 0705   LDLCALC 134 (H) 09/13/2017 0829   Hepatic Function Panel     Component Value Date/Time   PROT 7.3 09/13/2017 0829   ALBUMIN 4.4 09/13/2017 0829   AST 15 09/13/2017 0829   ALT 12 09/13/2017 0829   ALKPHOS 100 09/13/2017  0829   BILITOT 0.3 09/13/2017 0829   BILIDIR 0.1 03/23/2017 0705   IBILI 0.2 09/23/2011 0911      Component Value Date/Time   TSH 0.644 05/17/2017 1142   TSH 1.53 03/23/2017 0705   TSH 0.83 03/30/2016 0818    ASSESSMENT AND PLAN: Other hyperlipidemia  Other depression - with emotional eating - Plan: buPROPion (WELLBUTRIN SR) 150 MG 12 hr tablet  At risk for heart disease  Class 1 obesity with serious comorbidity and body mass index (BMI) of 30.0 to 30.9 in adult, unspecified obesity type  PLAN:  Hyperlipidemia Yvonne [name] was informed of the American Heart Association Guidelines emphasizing intensive lifestyle modifications as the first line treatment for hyperlipidemia. We discussed many lifestyle modifications  today in depth, and Yvonne [name] will continue to work on decreasing saturated fats such as fatty red meat, butter and many fried foods. She will also increase vegetables and lean protein in her diet and continue to work on diet, exercise, and weight loss efforts. We will recheck labs in 1 month and Yvonne [name] agrees to follow up with our clinic in 3 weeks.  Cardiovascular risk counselling Yvonne [name] was given extended (15 minutes) coronary artery disease prevention counseling today. She is 46 y.o. female and has risk factors for heart disease including obesity and hyperlipidemia. We discussed intensive lifestyle modifications today with an emphasis on specific weight loss instructions and strategies. Pt was also informed of the importance of increasing exercise and decreasing saturated fats to help prevent heart disease.  Depression with Emotional Eating Behaviors We discussed behavior modification techniques today to help Yvonne [name] deal with her emotional eating and depression. Yvonne [name] agrees to continue taking Wellbutrin SR 150 mg qd #30 and we will refill for 1 month. Yvonne [name] agrees to follow up with our clinic in 3 weeks.  Obesity Yvonne [name] is currently in the action stage of change. As such, her  goal is to continue with weight loss efforts She has agreed to follow the Category 3 plan Yvonne [name] has been instructed to work up to a goal of 150 minutes of combined cardio and strengthening exercise per week for weight loss and overall health benefits. We discussed the following Behavioral Modification Strategies today: dealing with family or coworker sabotage and no skipping meals   Yvonne [name] has agreed to follow up with our clinic in 3 weeks. She was informed of the importance of frequent follow up visits to maximize her success with intensive lifestyle modifications for her multiple health conditions.  I, Trixie Dredge, am acting as transcriptionist for Dennard Nip, MD  I have reviewed the above documentation for accuracy and completeness, and I agree with the above. -Dennard Nip, MD     Today's visit was # 13 out of 22.  Starting weight: 204 lbs Starting date: 05/17/17 Today's weight : 184 lbs  Today's date: 11/22/2017 Total lbs lost to date: 20 (Patients must lose 7 lbs in the first 6 months to continue with counseling)   ASK: We discussed the diagnosis of obesity with Aldona Lento today and Chauntel agreed to give [place] permission to discuss obesity behavioral modification therapy today.  ASSESS: Britt has the diagnosis of obesity and her BMI today is 30.62 Norlene is in the action stage of change   ADVISE: Bernarda was educated on the multiple health risks of obesity as well as the benefit of weight loss to improve her health. She was advised of the need for long term treatment and the importance of lifestyle modifications.  AGREE: Multiple dietary modification options and treatment options were discussed and  Yuri agreed to follow the Category 3 plan We discussed the following Behavioral Modification Strategies today: dealing with family or coworker sabotage and no skipping meals

## 2017-11-29 ENCOUNTER — Telehealth: Payer: Self-pay | Admitting: Internal Medicine

## 2017-11-29 NOTE — Telephone Encounter (Signed)
See note below and advise. 

## 2017-11-29 NOTE — Telephone Encounter (Signed)
[employer] employee calling to set up direct colon. Patient states Dr.Pyrtle did procedure on her husband this past weekend at the hosp and stated she could sch direct colon. Patient is under age 45, so just making sure this is okay to sch direct w/o ov first. Pt states no gi symptoms or hx. Please advise

## 2017-12-06 ENCOUNTER — Encounter: Payer: Self-pay | Admitting: Family

## 2017-12-06 ENCOUNTER — Ambulatory Visit (INDEPENDENT_AMBULATORY_CARE_PROVIDER_SITE_OTHER): Payer: 59 | Admitting: Family

## 2017-12-06 VITALS — BP 124/86 | HR 64 | Temp 98.5°F | Resp 16 | Ht 65.0 in | Wt 187.0 lb

## 2017-12-06 DIAGNOSIS — E559 Vitamin D deficiency, unspecified: Secondary | ICD-10-CM

## 2017-12-06 DIAGNOSIS — I10 Essential (primary) hypertension: Secondary | ICD-10-CM | POA: Diagnosis not present

## 2017-12-06 DIAGNOSIS — R7303 Prediabetes: Secondary | ICD-10-CM

## 2017-12-06 DIAGNOSIS — E785 Hyperlipidemia, unspecified: Secondary | ICD-10-CM | POA: Diagnosis not present

## 2017-12-06 NOTE — Assessment & Plan Note (Signed)
Encouraged her to continue to work on [employer], exercise, weight loss.

## 2017-12-06 NOTE — Assessment & Plan Note (Signed)
On vit D supplement per Dr. Leafy Ro.  She will repeat level in January.

## 2017-12-06 NOTE — Progress Notes (Signed)
Subjective:    Patient ID: Yvonne [name], female    DOB: Jun 22, 1971, 46 y.o.   MRN: [id number]  HPI  Yvonne [name] is a 46 yr old female who presents today for follow up.  1) HTN- not currently on antihypertensive. Chlorthalidone was discontinued by Dr. Leafy Ro.  BP Readings from Last 3 Encounters:  12/06/17 124/86  11/22/17 117/83  11/09/17 103/73   2) Hyperlipidemia- working on improving her diet.  Lab Results  Component Value Date   CHOL 202 (H) 09/13/2017   HDL 55 09/13/2017   LDLCALC 134 (H) 09/13/2017   TRIG 64 09/13/2017   CHOLHDL 4 03/23/2017   3) Vit D def- maintained on vit D 50000 units/day.  Wt Readings from Last 3 Encounters:  12/06/17 187 lb (84.8 kg)  11/22/17 184 lb (83.5 kg)  11/09/17 183 lb (83 kg)   Lab Results  Component Value Date   HGBA1C 5.7 (H) 09/13/2017    Review of Systems See HPI  Past Medical History:  Diagnosis Date  . High blood pressure   . Joint pain   . Kidney stones   . Microscopic hematuria   . Vitamin D deficiency      Social History   Socioeconomic History  . Marital status: Married    Spouse name: Not on file  . Number of children: 3  . Years of education: Not on file  . Highest education level: Not on file  Social Needs  . Financial resource strain: Not on file  . Food insecurity - worry: Not on file  . Food insecurity - inability: Not on file  . Transportation needs - medical: Not on file  . Transportation needs - non-medical: Not on file  Occupational History  . 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 Smoking status: Never Smoker  . Smokeless tobacco: Never Used  Substance and Sexual Activity  . Alcohol use: No  . Drug use: No  . Sexual activity: Not on file  Other Topics Concern  . 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 NO PAST SURGERIES     Denies surgical history    Family History  Problem Relation Age of Onset  . Healthy Father   . Congestive Heart Failure Father   . Heart disease Father   . Other Mother        deceased from multiple myelomas  . Cancer Mother     No Known Allergies  Current Outpatient Medications on File Prior to Visit  Medication Sig Dispense Refill  . buPROPion (WELLBUTRIN SR) 150 MG 12 hr tablet Take 1 tablet (150 mg total) by mouth daily. 30 tablet 0  . norethindrone (MICRONOR,CAMILA,ERRIN) 0.35 MG tablet Take 1 tablet by mouth daily.  99  . polyethylene glycol powder (GLYCOLAX/MIRALAX) powder Take 17 g by mouth daily. 3350 g 0  . Vitamin D, Ergocalciferol, (DRISDOL) 50000 units CAPS capsule Take 1 capsule (50,000 Units total) by mouth every 7 (seven) days. 4 capsule 0   No current facility-administered medications on file prior to visit.     BP 124/86 (BP Location: Right Arm, Cuff Size: Normal)   Pulse 64   Temp 98.5 F (36.9 C) (Oral)  Resp 16   Ht 5\' 5"  (1.651 m)   Wt 187 lb (84.8 kg)   SpO2 99%   BMI 31.12 kg/m       Objective:   Physical Exam  Constitutional: She is oriented to person, place, and time. She appears well-developed and well-nourished.  Cardiovascular: Normal rate, regular rhythm and normal heart sounds.  No murmur heard. Pulmonary/Chest: Effort normal and breath sounds normal. No respiratory distress. She has no wheezes.  Musculoskeletal: She exhibits no edema.  Neurological: She is alert and oriented to person, place, and time.  Psychiatric: She has a normal mood and affect. Her behavior is normal. Judgment and thought content normal.          Assessment & Plan:

## 2017-12-06 NOTE — Assessment & Plan Note (Signed)
Stable off of meds.

## 2017-12-06 NOTE — Patient Instructions (Signed)
Keep up the good work. Follow up as scheduled.

## 2017-12-06 NOTE — Telephone Encounter (Signed)
Ok for avg risk screening colonoscopy for African American patients at age 45 This should be covered as screening in this patient population and guidelines have recommended beginning at age 49 for African Americans at avg risk for [facility]

## 2017-12-06 NOTE — Telephone Encounter (Signed)
Left message on machine to call back  

## 2017-12-06 NOTE — Assessment & Plan Note (Signed)
Plan to repeat lipids at her upcoming cpx in April.

## 2017-12-08 NOTE — Telephone Encounter (Signed)
Pt scheduled for previsit 01/11/18@4 :30pm, Colon scheduled in the [facility] 01/21/18@8 :30am. Pt aware of appts.

## 2017-12-13 ENCOUNTER — Ambulatory Visit: Payer: 59 | Admitting: Family

## 2017-12-13 ENCOUNTER — Ambulatory Visit (INDEPENDENT_AMBULATORY_CARE_PROVIDER_SITE_OTHER): Payer: 59 | Admitting: Family Medicine

## 2017-12-13 VITALS — BP 113/80 | HR 81 | Temp 98.1°F | Ht 65.0 in | Wt 182.0 lb

## 2017-12-13 DIAGNOSIS — E559 Vitamin D deficiency, unspecified: Secondary | ICD-10-CM

## 2017-12-13 DIAGNOSIS — F3289 Other specified depressive episodes: Secondary | ICD-10-CM

## 2017-12-13 DIAGNOSIS — Z9189 Other specified personal risk factors, not elsewhere classified: Secondary | ICD-10-CM

## 2017-12-13 DIAGNOSIS — Z6830 Body mass index (BMI) 30.0-30.9, adult: Secondary | ICD-10-CM

## 2017-12-13 DIAGNOSIS — E669 Obesity, unspecified: Secondary | ICD-10-CM

## 2017-12-13 MED ORDER — BUPROPION HCL ER (SR) 150 MG PO TB12: 150.0000 mg | ORAL_TABLET | Freq: Every day | ORAL | 0 refills | Status: DC

## 2017-12-13 MED ORDER — VITAMIN D (ERGOCALCIFEROL) 1.25 MG (50000 UNIT) PO CAPS: 50000.0000 [IU] | ORAL_CAPSULE | ORAL | 0 refills | Status: DC

## 2017-12-14 ENCOUNTER — Ambulatory Visit (INDEPENDENT_AMBULATORY_CARE_PROVIDER_SITE_OTHER): Payer: 59 | Admitting: Family Medicine

## 2017-12-14 NOTE — Progress Notes (Signed)
Office: [phone]  /  Fax: [phone]   HPI:   Chief Complaint: OBESITY Yvonne [name] is here to discuss her progress with her obesity treatment plan. She is on the Category 3 plan and is following her eating plan approximately 90 % of the time. She states she is walking for 30 minutes 3 times per week. Yvonne [name], she has now lost >10% of her total body weight. She is much more mindful of her eating and doing better with meal planning. Her hunger is mostly controlled but she is struggling with craving sugar.  Her weight is 182 lb (82.6 kg) today and has had a weight [name] of 2 pounds over a period of 3 weeks since her last visit. She has lost 22 lbs since starting treatment with Yvonne [name].  Vitamin D deficiency Yvonne [name] has a diagnosis of vitamin D deficiency. She is stable on prescription Vit D, but not yet at goal. She denies nausea, vomiting or muscle weakness.  At risk for diabetes Yvonne [name] is at higher than average risk for developing diabetes due to her obesity. She currently denies polyuria or polydipsia.  Depression with emotional eating behaviors Yvonne [name] is on Wellbutrin and she is doing better with weight [name] but still struggling with emotional eating and stress eating. She has better strategies to deal with this now. She often snacks when she is not hungry. Yvonne [name] sometimes feels she is out of control and then feels guilty that she made poor food choices. She has been working on behavior modification techniques to help reduce her emotional eating and has been somewhat successful. She shows no sign of suicidal or homicidal ideations.  Depression screen Yvonne [name]  Decreased Interest 3 0  Down, Depressed, Hopeless 0 0  PHQ - 2 Score 3 0  Altered sleeping 3 0  Tired, decreased energy 3 0  Change in appetite 0 1  Feeling bad or failure about yourself  0 0  Trouble concentrating 0 0  Moving slowly or fidgety/restless 0 0  Suicidal thoughts  0 0  PHQ-9 Score 9 1   ALLERGIES: No Known Allergies  MEDICATIONS: Current Outpatient Medications on File Prior to Visit  Medication Sig Dispense Refill  . norethindrone (MICRONOR,CAMILA,ERRIN) 0.35 MG tablet Take 1 tablet by mouth daily.  99  . polyethylene glycol powder (GLYCOLAX/MIRALAX) powder Take 17 g by mouth daily. 3350 g 0   No current facility-administered medications on file prior to visit.     PAST MEDICAL HISTORY: Past Medical History:  Diagnosis Date  . High blood pressure   . Joint pain   . Kidney stones   . Microscopic hematuria   . Vitamin D deficiency     PAST SURGICAL HISTORY: Past Surgical History:  Procedure Laterality Date  . NO PAST SURGERIES     Denies surgical history    SOCIAL HISTORY: Social History   Tobacco Use  . Smoking status: Never Smoker  . Smokeless tobacco: Never Used  Substance Use Topics  . Alcohol use: No  . Drug use: No    FAMILY HISTORY: Family History  Problem Relation Age of Onset  . Healthy Father   . Congestive Heart Failure Father   . Heart disease Father   . Other Mother        deceased from multiple myelomas  . Cancer Mother     ROS: Review of Systems  Constitutional: Positive for weight [name].  Gastrointestinal: Negative for nausea and vomiting.  Genitourinary:  Negative for frequency.  Musculoskeletal:       Negative muscle weakness  Endo/Heme/Allergies: Negative for polydipsia.  Psychiatric/Behavioral: Positive for depression. Negative for suicidal ideas.    PHYSICAL EXAM: Blood pressure 113/80, pulse 81, temperature 98.1 F (36.7 C), temperature source Oral, height 5\' 5"  (1.651 m), weight 182 lb (82.6 kg), SpO2 98 %. Body mass index is 30.29 kg/m. Physical Exam  Constitutional: She is oriented to person, place, and time. She appears well-developed and well-nourished.  Cardiovascular: Normal rate.  Pulmonary/Chest: Effort normal.  Musculoskeletal: Normal range of motion.  Neurological: She is  oriented to person, place, and time.  Skin: Skin is warm and dry.  Psychiatric: She has a normal mood and affect.  Vitals reviewed.   RECENT LABS AND TESTS: BMET    Component Value Date/Time   NA 141 09/13/2017 0829   K 3.5 09/13/2017 0829   CL 101 09/13/2017 0829   CO2 22 09/13/2017 0829   GLUCOSE 92 09/13/2017 0829   GLUCOSE 135 (H) 04/21/2017 0713   BUN 16 09/13/2017 0829   CREATININE 0.88 09/13/2017 0829   CALCIUM 9.7 09/13/2017 0829   GFRNONAA 80 09/13/2017 0829   GFRAA 92 09/13/2017 0829   Lab Results  Component Value Date   HGBA1C 5.7 (H) 09/13/2017   HGBA1C 6.2 (H) 05/17/2017   HGBA1C 5.8 10/12/2014   HGBA1C 5.7 09/14/2011   Lab Results  Component Value Date   INSULIN 35.7 (H) 09/13/2017   INSULIN 27.9 (H) 05/17/2017   CBC    Component Value Date/Time   WBC 11.9 (H) 05/17/2017 1142   WBC 10.3 03/23/2017 0705   RBC 4.48 05/17/2017 1142   RBC 4.59 03/23/2017 0705   HGB 13.7 05/17/2017 1142   HCT 42.1 05/17/2017 1142   PLT 360.0 03/23/2017 0705   MCV 94 05/17/2017 1142   MCH 30.6 05/17/2017 1142   MCHC 32.5 05/17/2017 1142   MCHC 33.6 03/23/2017 0705   RDW 14.2 05/17/2017 1142   LYMPHSABS 3.9 (H) 05/17/2017 1142   MONOABS 0.6 03/23/2017 0705   EOSABS 0.1 05/17/2017 1142   BASOSABS 0.0 05/17/2017 1142   Iron/TIBC/Ferritin/ %Sat No results found for: IRON, TIBC, FERRITIN, IRONPCTSAT Lipid Panel     Component Value Date/Time   CHOL 202 (H) 09/13/2017 0829   TRIG 64 09/13/2017 0829   HDL 55 09/13/2017 0829   CHOLHDL 4 03/23/2017 0705   VLDL 20.8 03/23/2017 0705   LDLCALC 134 (H) 09/13/2017 0829   Hepatic Function Panel     Component Value Date/Time   PROT 7.3 09/13/2017 0829   ALBUMIN 4.4 09/13/2017 0829   AST 15 09/13/2017 0829   ALT 12 09/13/2017 0829   ALKPHOS 100 09/13/2017 0829   BILITOT 0.3 09/13/2017 0829   BILIDIR 0.1 03/23/2017 0705   IBILI 0.2 09/23/2011 0911      Component Value Date/Time   TSH 0.644 05/17/2017 1142   TSH  1.53 03/23/2017 0705   TSH 0.83 03/30/2016 0818  Results for LAYNA, ROEPER D (MRN [id number]) as of 12/14/2017 07:53  Ref. Range 09/13/2017 08:29  Vitamin D, 25-Hydroxy Latest Ref Range: 30.0 - 100.0 ng/mL 41.3    ASSESSMENT AND PLAN: Vitamin D deficiency - Plan: Vitamin D, Ergocalciferol, (DRISDOL) 50000 units CAPS capsule  Other depression - with emotional eating - Plan: buPROPion (WELLBUTRIN SR) 150 MG 12 hr tablet  At risk for diabetes mellitus  Class 1 obesity with serious comorbidity and body mass index (BMI) of 30.0 to 30.9 in adult, unspecified obesity  type  PLAN:  Vitamin D Deficiency Yvonne [name] was informed that low vitamin D levels contributes to fatigue and are associated with obesity, breast, and colon cancer. Yvonne [name] agrees to continue taking prescription Vit D @50 ,000 IU every week #4 and we will refill for 1 month. She will follow up for routine testing of vitamin D, at least 2-3 times per year. She was informed of the risk of over-replacement of vitamin D and agrees to not increase her dose unless he discusses this with Yvonne [name] first. Yvonne [name] agrees to follow up with our clinic in 2 weeks.  Diabetes risk counselling Yvonne [name] was given extended (15 minutes) diabetes prevention counseling today. She is 47 y.o. female and has risk factors for diabetes including obesity. We discussed intensive lifestyle modifications today with an emphasis on weight [name] as well as increasing exercise and decreasing simple carbohydrates in her diet.  Depression with Emotional Eating Behaviors We discussed behavior modification techniques today to help [facility] deal with her emotional eating and depression. Floye agrees to continue taking Wellbutrin SR 150 mg qd #30 and we will refill for 1 month. Deon agrees to follow up with our clinic in 2 weeks.  Obesity Yvonne [name] is currently in the action stage of change. As such, her goal is to continue with weight [name] efforts She has agreed to change to follow a  lower carbohydrate, vegetable and lean protein rich diet plan Yvonne [name] has been instructed to work up to a goal of 150 minutes of combined cardio and strengthening exercise per week for weight [name] and overall health benefits. We discussed the following Behavioral Modification Strategies today: increasing vegetables, work on meal planning and easy cooking plans, no skipping meals, and planning for success   Yvonne [name] has agreed to follow up with our clinic in 2 weeks. She was informed of the importance of frequent follow up visits to maximize her success with intensive lifestyle modifications for her multiple health conditions.   OBESITY BEHAVIORAL INTERVENTION VISIT  Today's visit was # 14 out of 22.  Starting weight: 204 lbs Starting date: 05/17/17 Today's weight : 182 lbs  Today's date: 12/13/2017 Total lbs lost to date: 24 (Patients must lose 7 lbs in the first 6 months to continue with counseling)   ASK: We discussed the diagnosis of obesity with Yvonne [name] today and Yvonne [name] agreed to give Yvonne [name] permission to discuss obesity behavioral modification therapy today.  ASSESS: Rayanna has the diagnosis of obesity and her BMI today is 30.29 [place] is in the action stage of change   ADVISE: Yvonne [name] was educated on the multiple health risks of obesity as well as the benefit of weight [name] to improve her health. She was advised of the need for long term treatment and the importance of lifestyle modifications.  AGREE: Multiple dietary modification options and treatment options were discussed and  Palyn agreed to the above obesity treatment plan.  I, Trixie Dredge, am acting as transcriptionist for Dennard Nip, MD  I have reviewed the above documentation for accuracy and completeness, and I agree with the above. -Dennard Nip, MD

## 2017-12-20 MED FILL — VIT D2 1.25 MG (50,000 UNIT: 1.25 MG | 28 days supply | Qty: 4 | Fill #0

## 2017-12-20 MED FILL — BUPROPION SR 150 MG TABLET: 150 | 30 days supply | Qty: 30 | Fill #0

## 2017-12-20 MED FILL — NORETHINDRONE 0.35 MG TAB: 0.35 | 84 days supply | Qty: 84 | Fill #3

## 2017-12-27 ENCOUNTER — Ambulatory Visit (INDEPENDENT_AMBULATORY_CARE_PROVIDER_SITE_OTHER): Payer: 59 | Admitting: Family Medicine

## 2017-12-27 VITALS — BP 129/86 | HR 93 | Temp 98.3°F | Ht 65.0 in | Wt 180.0 lb

## 2017-12-27 DIAGNOSIS — F3289 Other specified depressive episodes: Secondary | ICD-10-CM | POA: Diagnosis not present

## 2017-12-27 DIAGNOSIS — E559 Vitamin D deficiency, unspecified: Secondary | ICD-10-CM

## 2017-12-27 DIAGNOSIS — E669 Obesity, unspecified: Secondary | ICD-10-CM | POA: Diagnosis not present

## 2017-12-27 DIAGNOSIS — Z9189 Other specified personal risk factors, not elsewhere classified: Secondary | ICD-10-CM | POA: Diagnosis not present

## 2017-12-27 DIAGNOSIS — Z6830 Body mass index (BMI) 30.0-30.9, adult: Secondary | ICD-10-CM

## 2017-12-28 NOTE — Progress Notes (Signed)
Office: [phone]  /  Fax: [phone]   HPI:   Chief Complaint: OBESITY Yvonne [name] is here to discuss her progress with her obesity treatment plan. She is on the lower carbohydrate, vegetable and lean protein rich diet plan and is following her eating plan approximately 90 % of the time. She states she is exercising 0 minutes 0 times per week. Yvonne [name] reports she has been doing low carbohydrate for past few days (4 days). She doesn'Yvonne [name] feel like she will have many challenges except for meal planning or packing her lunch.  Her weight is 180 lb (81.6 kg) today and has had a weight loss of 2 pounds over a period of 2 weeks since her last visit. She has lost 24 lbs since starting treatment with [place].  Vitamin D deficiency Yvonne [name] has a diagnosis of vitamin D deficiency. She is currently taking prescription Vit D and denies nausea, vomiting or muscle weakness.  At risk for diabetes Yvonne [name] is at higher than average risk for developing diabetes due to her obesity. She currently denies polyuria or polydipsia.  Depression with emotional eating behaviors Yvonne [name] is on Wellbutrin, her blood pressure is normal today and some minimal tremor noted. Yvonne [name] struggles with emotional eating and using food for comfort to the extent that it is negatively impacting her health. She often snacks when she is not hungry. Yvonne [name] sometimes feels she is out of control and then feels guilty that she made poor food choices. She has been working on behavior modification techniques to help reduce her emotional eating and has been somewhat successful. She shows no sign of suicidal or homicidal ideations.  Depression screen Yvonne [name] 2/9 05/17/2017 04/02/2017  Decreased Interest 3 0  Down, Depressed, Hopeless 0 0  PHQ - 2 Score 3 0  Altered sleeping 3 0  Tired, decreased energy 3 0  Change in appetite 0 1  Feeling bad or failure about yourself  0 0  Trouble concentrating 0 0  Moving slowly or fidgety/restless 0 0  Suicidal  thoughts 0 0  PHQ-9 Score 9 1   ALLERGIES: No Known Allergies  MEDICATIONS: Current Outpatient Medications on File Prior to Visit  Medication Sig Dispense Refill  . buPROPion (WELLBUTRIN SR) 150 MG 12 hr tablet Take 1 tablet (150 mg total) by mouth daily. 30 tablet 0  . norethindrone (MICRONOR,CAMILA,ERRIN) 0.35 MG tablet Take 1 tablet by mouth daily.  99  . polyethylene glycol powder (GLYCOLAX/MIRALAX) powder Take 17 g by mouth daily. 3350 g 0  . Vitamin D, Ergocalciferol, (DRISDOL) 50000 units CAPS capsule Take 1 capsule (50,000 Units total) by mouth every 7 (seven) days. 4 capsule 0   No current facility-administered medications on file prior to visit.     PAST MEDICAL HISTORY: Past Medical History:  Diagnosis Date  . High blood pressure   . Joint pain   . Kidney stones   . Microscopic hematuria   . Vitamin D deficiency     PAST SURGICAL HISTORY: Past Surgical History:  Procedure Laterality Date  . NO PAST SURGERIES     Denies surgical history    SOCIAL HISTORY: Social History   Tobacco Use  . Smoking status: Never Smoker  . Smokeless tobacco: Never Used  Substance Use Topics  . Alcohol use: No  . Drug use: No    FAMILY HISTORY: Family History  Problem Relation Age of Onset  . Healthy Father   . Congestive Heart Failure Father   . Heart disease Father   . Other  Mother        deceased from multiple myelomas  . Cancer Mother     ROS: Review of Systems  Constitutional: Positive for weight loss.  Gastrointestinal: Negative for nausea and vomiting.  Genitourinary: Negative for frequency.  Musculoskeletal:       Negative muscle weakness  Endo/Heme/Allergies: Negative for polydipsia.  Psychiatric/Behavioral: Positive for depression. Negative for suicidal ideas.    PHYSICAL EXAM: Blood pressure 129/86, pulse 93, temperature 98.3 F (36.8 C), temperature source Oral, height 5\' 5"  (1.651 m), weight 180 lb (81.6 kg), SpO2 98 %. Body mass index is 29.95  kg/m. Physical Exam  Constitutional: She is oriented to person, place, and time. She appears well-developed and well-nourished.  Cardiovascular: Normal rate.  Pulmonary/Chest: Effort normal.  Musculoskeletal: Normal range of motion.  Neurological: She is oriented to person, place, and time.  Skin: Skin is warm and dry.  Psychiatric: She has a normal mood and affect. Her behavior is normal.  Vitals reviewed.   RECENT LABS AND TESTS: BMET    Component Value Date/Time   NA 141 09/13/2017 0829   K 3.5 09/13/2017 0829   CL 101 09/13/2017 0829   CO2 22 09/13/2017 0829   GLUCOSE 92 09/13/2017 0829   GLUCOSE 135 (H) 04/21/2017 0713   BUN 16 09/13/2017 0829   CREATININE 0.88 09/13/2017 0829   CALCIUM 9.7 09/13/2017 0829   GFRNONAA 80 09/13/2017 0829   GFRAA 92 09/13/2017 0829   Lab Results  Component Value Date   HGBA1C 5.7 (H) 09/13/2017   HGBA1C 6.2 (H) 05/17/2017   HGBA1C 5.8 10/12/2014   HGBA1C 5.7 09/14/2011   Lab Results  Component Value Date   INSULIN 35.7 (H) 09/13/2017   INSULIN 27.9 (H) 05/17/2017   CBC    Component Value Date/Time   WBC 11.9 (H) 05/17/2017 1142   WBC 10.3 03/23/2017 0705   RBC 4.48 05/17/2017 1142   RBC 4.59 03/23/2017 0705   HGB 13.7 05/17/2017 1142   HCT 42.1 05/17/2017 1142   PLT 360.0 03/23/2017 0705   MCV 94 05/17/2017 1142   MCH 30.6 05/17/2017 1142   MCHC 32.5 05/17/2017 1142   MCHC 33.6 03/23/2017 0705   RDW 14.2 05/17/2017 1142   LYMPHSABS 3.9 (H) 05/17/2017 1142   MONOABS 0.6 03/23/2017 0705   EOSABS 0.1 05/17/2017 1142   BASOSABS 0.0 05/17/2017 1142   Iron/TIBC/Ferritin/ %Sat No results found for: IRON, TIBC, FERRITIN, IRONPCTSAT Lipid Panel     Component Value Date/Time   CHOL 202 (H) 09/13/2017 0829   TRIG 64 09/13/2017 0829   HDL 55 09/13/2017 0829   CHOLHDL 4 03/23/2017 0705   VLDL 20.8 03/23/2017 0705   LDLCALC 134 (H) 09/13/2017 0829   Hepatic Function Panel     Component Value Date/Time   PROT 7.3  09/13/2017 0829   ALBUMIN 4.4 09/13/2017 0829   AST 15 09/13/2017 0829   ALT 12 09/13/2017 0829   ALKPHOS 100 09/13/2017 0829   BILITOT 0.3 09/13/2017 0829   BILIDIR 0.1 03/23/2017 0705   IBILI 0.2 09/23/2011 0911      Component Value Date/Time   TSH 0.644 05/17/2017 1142   TSH 1.53 03/23/2017 0705   TSH 0.83 03/30/2016 0818  Results for NAILYN, DEARINGER D (MRN [id number]) as of 12/28/2017 07:37  Ref. Range 09/13/2017 08:29  Vitamin D, 25-Hydroxy Latest Ref Range: 30.0 - 100.0 ng/mL 41.3    ASSESSMENT AND PLAN: Vitamin D deficiency  Other depression - with emotional eating  At risk  for diabetes mellitus  Class 1 obesity with serious comorbidity and body mass index (BMI) of 30.0 to 30.9 in adult, unspecified obesity type  PLAN:  Vitamin D Deficiency Yvonne [name] was informed that low vitamin D levels contributes to fatigue and are associated with obesity, breast, and colon cancer. Yvonne [name] agrees to continue taking prescription Vit D @50 ,000 IU every week #4 and will follow up for routine testing of vitamin D, at least 2-3 times per year. She was informed of the risk of over-replacement of vitamin D and agrees to not increase her dose unless she discusses this with [place] first. Yvonne [name] agrees to follow up with our clinic in 2 weeks.  Diabetes risk counselling Yvonne [name] was given extended (15 minutes) diabetes prevention counseling today. She is 47 y.o. female and has risk factors for diabetes including obesity. We discussed intensive lifestyle modifications today with an emphasis on weight loss as well as increasing exercise and decreasing simple carbohydrates in her diet.  Depression with Emotional Eating Behaviors We discussed behavior modification techniques today to help Yvonne [name] deal with her emotional eating and depression. Yvonne [name] agrees to continue taking Wellbutrin SR 150 mg qd and she agrees to follow up with our clinic in 2 weeks.  Obesity Yvonne [name] is currently in the action stage of  change. As such, her goal is to continue with weight loss efforts She has agreed to follow a lower carbohydrate, vegetable and lean protein rich diet plan Yvonne [name] has been instructed to work up to a goal of 150 minutes of combined cardio and strengthening exercise per week for weight loss and overall health benefits. We discussed the following Behavioral Modification Strategies today: increasing lean protein intake, increasing vegetables and work on meal planning and easy cooking plans   Yvonne [name] has agreed to follow up with our clinic in 2 weeks. She was informed of the importance of frequent follow up visits to maximize her success with intensive lifestyle modifications for her multiple health conditions.   OBESITY BEHAVIORAL INTERVENTION VISIT  Today's visit was # 15 out of 22.  Starting weight: 204 lbs Starting date: 05/17/17 Today's weight : 180 lbs  Today's date: 12/27/2017 Total lbs lost to date: 24 (Patients must lose 7 lbs in the first 6 months to continue with counseling)   ASK: We discussed the diagnosis of obesity with Yvonne [name] today and Yvonne [name] agreed to give [place] permission to discuss obesity behavioral modification therapy today.  ASSESS: Yvonne [name] has the diagnosis of obesity and her BMI today is 29.95 Yvonne [name] is in the action stage of change   ADVISE: Yvonne [name] was educated on the multiple health risks of obesity as well as the benefit of weight loss to improve her health. She was advised of the need for long term treatment and the importance of lifestyle modifications.  AGREE: Multiple dietary modification options and treatment options were discussed and  Yvonne [name] agreed to the above obesity treatment plan.  I, Trixie Dredge, am acting as transcriptionist for Dennard Nip, MD  I have reviewed the above documentation for accuracy and completeness, and I agree with the above. -Dennard Nip, MD

## 2018-01-10 ENCOUNTER — Ambulatory Visit (INDEPENDENT_AMBULATORY_CARE_PROVIDER_SITE_OTHER): Payer: 59 | Admitting: Family Medicine

## 2018-01-10 VITALS — BP 120/85 | HR 88 | Temp 98.0°F | Ht 65.0 in | Wt 181.0 lb

## 2018-01-10 DIAGNOSIS — Z6830 Body mass index (BMI) 30.0-30.9, adult: Secondary | ICD-10-CM | POA: Diagnosis not present

## 2018-01-10 DIAGNOSIS — Z9189 Other specified personal risk factors, not elsewhere classified: Secondary | ICD-10-CM

## 2018-01-10 DIAGNOSIS — F3289 Other specified depressive episodes: Secondary | ICD-10-CM

## 2018-01-10 DIAGNOSIS — E669 Obesity, unspecified: Secondary | ICD-10-CM | POA: Diagnosis not present

## 2018-01-10 DIAGNOSIS — E559 Vitamin D deficiency, unspecified: Secondary | ICD-10-CM

## 2018-01-10 MED ORDER — VITAMIN D (ERGOCALCIFEROL) 1.25 MG (50000 UNIT) PO CAPS: 50000.0000 [IU] | ORAL_CAPSULE | ORAL | 0 refills | Status: DC

## 2018-01-10 MED ORDER — BUPROPION HCL ER (SR) 200 MG PO TB12: 200.0000 mg | ORAL_TABLET | Freq: Every day | ORAL | 0 refills | Status: DC

## 2018-01-11 ENCOUNTER — Ambulatory Visit (AMBULATORY_SURGERY_CENTER): Payer: Self-pay

## 2018-01-11 ENCOUNTER — Encounter: Payer: Self-pay | Admitting: Internal Medicine

## 2018-01-11 ENCOUNTER — Other Ambulatory Visit: Payer: Self-pay

## 2018-01-11 VITALS — Ht 65.0 in | Wt 185.4 lb

## 2018-01-11 DIAGNOSIS — Z1211 Encounter for screening for malignant neoplasm of colon: Secondary | ICD-10-CM

## 2018-01-11 MED ORDER — NA SULFATE-K SULFATE-MG SULF 17.5-3.13-1.6 GM/177ML PO SOLN: 1.0000 | Freq: Once | ORAL | 0 refills | Status: AC

## 2018-01-11 MED FILL — VIT D2 1.25 MG (50,000 UNIT: 1.25 MG | 28 days supply | Qty: 4 | Fill #0

## 2018-01-11 MED FILL — BUPROPION HCL SR 200 MG TAB: 200 | 30 days supply | Qty: 30 | Fill #0

## 2018-01-11 MED FILL — SUPREP BOWEL PREP KIT: 17.5-3.13-1 | 1 days supply | Qty: 354 | Fill #0

## 2018-01-11 NOTE — Progress Notes (Signed)
Office: [phone]  /  Fax: [phone]   HPI:   Chief Complaint: OBESITY Yvonne [name] is here to discuss her progress with her obesity treatment plan. She is on the lower carbohydrate, vegetable and lean protein rich diet plan and is following her eating plan approximately 20 % of the time. She states she is walking for 40 minutes 5 times per week. Yvonne [name] was unable to follow the low carbohydrate plan closely while sick with upper GI virus. She is feeling better but would like to change back to her category plan.  Her weight is 181 lb (82.1 kg) today and has gained 1 pound since her last visit. She has lost 23 lbs since starting treatment with [place].  Vitamin D deficiency Yvonne [name] has a diagnosis of vitamin D deficiency. She is stable on prescription Vit D, but not yet at goal. She denies nausea, vomiting or muscle weakness.  At risk for diabetes Yvonne [name] is at higher than average risk for developing diabetes due to her obesity. She currently denies polyuria or polydipsia.  Depression with emotional eating behaviors Yvonne [name] is on Wellbutrin SR 150 and feels it isn't helping with emotional eating as much as it used to. Yvonne [name] struggles with emotional eating and using food for comfort to the extent that it is negatively impacting her health. She often snacks when she is not hungry. Yvonne [name] sometimes feels she is out of control and then feels guilty that she made poor food choices. She has been working on behavior modification techniques to help reduce her emotional eating and has been somewhat successful. She denies insomnia and shows no sign of suicidal or homicidal ideations.  Depression screen Yvonne [name], Yvonne [name], Hopeless 0 0  PHQ - 2 Score 3 0  Altered sleeping 3 0  Tired, decreased energy 3 0  Change in appetite 0 1  Feeling bad or failure about yourself  0 0  Trouble concentrating 0 0  Moving slowly or fidgety/restless 0 0  Suicidal thoughts  0 0  PHQ-9 Score 9 1   ALLERGIES: No Known Allergies  MEDICATIONS: Current Outpatient Medications on File Prior to Visit  Medication Sig Dispense Refill  . norethindrone (MICRONOR,CAMILA,ERRIN) 0.35 MG tablet Take 1 tablet by mouth daily.  99  . polyethylene glycol powder (GLYCOLAX/MIRALAX) powder Take 17 g by mouth daily. 3350 g 0   No current facility-administered medications on file prior to visit.     PAST MEDICAL HISTORY: Past Medical History:  Diagnosis Date  . High blood pressure   . Joint pain   . Kidney stones   . Microscopic hematuria   . Vitamin D deficiency     PAST SURGICAL HISTORY: Past Surgical History:  Procedure Laterality Date  . NO PAST SURGERIES     Denies surgical history    SOCIAL HISTORY: Social History   Tobacco Use  . Smoking status: Never Smoker  . Smokeless tobacco: Never Used  Substance Use Topics  . Alcohol use: No  . Drug use: No    FAMILY HISTORY: Family History  Problem Relation Age of Onset  . Healthy Father   . Congestive Heart Failure Father   . Heart disease Father   . Other Mother        deceased from multiple myelomas  . Cancer Mother     ROS: Review of Systems  Constitutional: Negative for weight loss.  Gastrointestinal: Negative for nausea and vomiting.  Genitourinary: Negative for frequency.  Musculoskeletal:  Negative muscle weakness  Endo/Heme/Allergies: Negative for polydipsia.  Psychiatric/Behavioral: Positive for depression. Negative for suicidal ideas. The patient does not have insomnia.     PHYSICAL EXAM: Blood pressure 120/85, pulse 88, temperature 98 F (36.7 C), temperature source Oral, height 5\' 5"  (1.651 m), weight 181 lb (82.1 kg), SpO2 98 %. Body mass index is 30.12 kg/m. Physical Exam  Constitutional: She is oriented to person, place, and time. She appears well-developed and well-nourished.  Cardiovascular: Normal rate.  Pulmonary/Chest: Effort normal.  Musculoskeletal: Normal range  of motion.  Neurological: She is oriented to person, place, and time.  Skin: Skin is warm and dry.  Psychiatric: She has a normal mood and affect. Her behavior is normal.  Vitals reviewed.   RECENT LABS AND TESTS: BMET    Component Value Date/Time   NA 141 09/13/2017 0829   K 3.5 09/13/2017 0829   CL 101 09/13/2017 0829   CO2 22 09/13/2017 0829   GLUCOSE 92 09/13/2017 0829   GLUCOSE 135 (H) 04/21/2017 0713   BUN 16 09/13/2017 0829   CREATININE 0.88 09/13/2017 0829   CALCIUM 9.7 09/13/2017 0829   GFRNONAA 80 09/13/2017 0829   GFRAA 92 09/13/2017 0829   Lab Results  Component Value Date   HGBA1C 5.7 (H) 09/13/2017   HGBA1C 6.2 (H) 05/17/2017   HGBA1C 5.8 10/12/2014   HGBA1C 5.7 09/14/2011   Lab Results  Component Value Date   INSULIN 35.7 (H) 09/13/2017   INSULIN 27.9 (H) 05/17/2017   CBC    Component Value Date/Time   WBC 11.9 (H) 05/17/2017 1142   WBC 10.3 03/23/2017 0705   RBC 4.48 05/17/2017 1142   RBC 4.59 03/23/2017 0705   HGB 13.7 05/17/2017 1142   HCT 42.1 05/17/2017 1142   PLT 360.0 03/23/2017 0705   MCV 94 05/17/2017 1142   MCH 30.6 05/17/2017 1142   MCHC 32.5 05/17/2017 1142   MCHC 33.6 03/23/2017 0705   RDW 14.2 05/17/2017 1142   LYMPHSABS 3.9 (H) 05/17/2017 1142   MONOABS 0.6 03/23/2017 0705   EOSABS 0.1 05/17/2017 1142   BASOSABS 0.0 05/17/2017 1142   Iron/TIBC/Ferritin/ %Sat No results found for: IRON, TIBC, FERRITIN, IRONPCTSAT Lipid Panel     Component Value Date/Time   CHOL 202 (H) 09/13/2017 0829   TRIG 64 09/13/2017 0829   HDL 55 09/13/2017 0829   CHOLHDL 4 03/23/2017 0705   VLDL 20.8 03/23/2017 0705   LDLCALC 134 (H) 09/13/2017 0829   Hepatic Function Panel     Component Value Date/Time   PROT 7.3 09/13/2017 0829   ALBUMIN 4.4 09/13/2017 0829   AST 15 09/13/2017 0829   ALT 12 09/13/2017 0829   ALKPHOS 100 09/13/2017 0829   BILITOT 0.3 09/13/2017 0829   BILIDIR 0.1 03/23/2017 0705   IBILI 0.2 09/23/2011 0911        Component Value Date/Time   TSH 0.644 05/17/2017 1142   TSH 1.53 03/23/2017 0705   TSH 0.83 03/30/2016 0818  Results for NICCI, VAUGHAN D (MRN [id number]) as of 01/11/2018 07:35  Ref. Range 09/13/2017 08:29  Vitamin D, 25-Hydroxy Latest Ref Range: 30.0 - 100.0 ng/mL 41.3    ASSESSMENT AND PLAN: Vitamin D deficiency - Plan: Vitamin D, Ergocalciferol, (DRISDOL) 50000 units CAPS capsule  Other depression - with emotional eating - Plan: buPROPion (WELLBUTRIN SR) 200 MG 12 hr tablet  At risk for diabetes mellitus  Class 1 obesity with serious comorbidity and body mass index (BMI) of 30.0 to 30.9 in adult, unspecified  obesity type  PLAN:  Vitamin D Deficiency Lamiyah was informed that low vitamin D levels contributes to fatigue and are associated with obesity, breast, and colon cancer. Trula agrees to continue taking prescription Vit D @50 ,000 IU every week #4 and we will refill for 1 month. She will follow up for routine testing of vitamin D, at least 2-3 times per year. She was informed of the risk of over-replacement of vitamin D and agrees to not increase her dose unless she discusses this with [place] first. Shanyia agrees to follow up with our clinic in 2 weeks.  Diabetes risk counselling Carmelia was given extended (15 minutes) diabetes prevention counseling today. She is 47 y.o. female and has risk factors for diabetes including obesity. We discussed intensive lifestyle modifications today with an emphasis on weight loss as well as increasing exercise and decreasing simple carbohydrates in her diet.  Depression with Emotional Eating Behaviors We discussed behavior modification techniques today to help [facility] deal with her emotional eating and depression. Jolea agrees to increase Wellbutrin SR to 200 mg q AM #30 with no refills. Sienna agrees to follow up with our clinic in 2 weeks.  Obesity Jeanine is currently in the action stage of change. As such, her goal is to continue with weight loss  efforts She has agreed to change to follow the Category 3 plan with breakfast options [place] has been instructed to work up to a goal of 150 minutes of combined cardio and strengthening exercise per week for weight loss and overall health benefits. We discussed the following Behavioral Modification Strategies today: increasing lean protein intake and decreasing simple carbohydrates    Jesyca has agreed to follow up with our clinic in 2 weeks. She was informed of the importance of frequent follow up visits to maximize her success with intensive lifestyle modifications for her multiple health conditions.   OBESITY BEHAVIORAL INTERVENTION VISIT  Today's visit was # 16 out of 22.  Starting weight: 204 lbs Starting date: 05/17/17 Today's weight : 181 lbs  Today's date: 01/10/2018 Total lbs lost to date: 23 (Patients must lose 7 lbs in the first 6 months to continue with counseling)   ASK: We discussed the diagnosis of obesity with Aldona Lento today and Khyli agreed to give [place] permission to discuss obesity behavioral modification therapy today.  ASSESS: Ashlynd has the diagnosis of obesity and her BMI today is 30.12 Halona is in the action stage of change   ADVISE: Aviv was educated on the multiple health risks of obesity as well as the benefit of weight loss to improve her health. She was advised of the need for long term treatment and the importance of lifestyle modifications.  AGREE: Multiple dietary modification options and treatment options were discussed and  Sherle agreed to the above obesity treatment plan.  I, Trixie Dredge, am acting as transcriptionist for Dennard Nip, MD I have reviewed the above documentation for accuracy and completeness, and I agree with the above. -Dennard Nip, MD

## 2018-01-11 NOTE — Progress Notes (Signed)
Denies allergies to eggs or soy products. Denies complication of anesthesia or sedation. Denies use of weight loss medication. Denies use of O2.   Emmi instructions declined.  

## 2018-01-21 ENCOUNTER — Ambulatory Visit (AMBULATORY_SURGERY_CENTER): Payer: 59 | Admitting: Internal Medicine

## 2018-01-21 ENCOUNTER — Other Ambulatory Visit: Payer: Self-pay

## 2018-01-21 ENCOUNTER — Encounter: Payer: Self-pay | Admitting: Internal Medicine

## 2018-01-21 VITALS — BP 120/88 | HR 84 | Temp 98.4°F | Resp 16 | Ht 65.0 in | Wt 185.0 lb

## 2018-01-21 DIAGNOSIS — D124 Benign neoplasm of descending colon: Secondary | ICD-10-CM | POA: Diagnosis not present

## 2018-01-21 DIAGNOSIS — Z1211 Encounter for screening for malignant neoplasm of colon: Secondary | ICD-10-CM | POA: Diagnosis present

## 2018-01-21 DIAGNOSIS — I10 Essential (primary) hypertension: Secondary | ICD-10-CM | POA: Diagnosis not present

## 2018-01-21 DIAGNOSIS — K635 Polyp of colon: Secondary | ICD-10-CM | POA: Diagnosis not present

## 2018-01-21 DIAGNOSIS — D649 Anemia, unspecified: Secondary | ICD-10-CM | POA: Diagnosis not present

## 2018-01-21 DIAGNOSIS — D125 Benign neoplasm of sigmoid colon: Secondary | ICD-10-CM | POA: Diagnosis not present

## 2018-01-21 DIAGNOSIS — D122 Benign neoplasm of ascending colon: Secondary | ICD-10-CM | POA: Diagnosis not present

## 2018-01-21 LAB — HM COLONOSCOPY

## 2018-01-21 MED ORDER — SODIUM CHLORIDE 0.9 % IV SOLN: 500.0000 mL | Freq: Once | INTRAVENOUS | Status: DC

## 2018-01-21 NOTE — Op Note (Signed)
[facility] Patient Name: Yvonne [name] Procedure Date: 01/21/2018 8:37 AM MRN: [id number] Endoscopist: Jerene Bears , MD Age: 47 Referring MD:  Date of Birth: 12-16-1970 Gender: Female Account #: [id number] Procedure:                Colonoscopy Indications:              Screening for colorectal malignant neoplasm, This                            is the patient's first colonoscopy Medicines:                Monitored Anesthesia Care Procedure:                Pre-Anesthesia Assessment:                           - Prior to the procedure, a History and Physical                            was performed, and patient medications and                            allergies were reviewed. The patient's tolerance of                            previous anesthesia was also reviewed. The risks                            and benefits of the procedure and the sedation                            options and risks were discussed with the patient.                            All questions were answered, and informed consent                            was obtained. Prior Anticoagulants: The patient has                            taken no previous anticoagulant or antiplatelet                            agents. ASA Grade Assessment: II - A patient with                            mild systemic disease. After reviewing the risks                            and benefits, the patient was deemed in                            satisfactory condition to undergo the procedure.  After obtaining informed consent, the colonoscope                            was passed under direct vision. Throughout the                            procedure, the patient's blood pressure, pulse, and                            oxygen saturations were monitored continuously. The                            Colonoscope was introduced through the anus and                            advanced to the the cecum,  identified by                            appendiceal orifice and ileocecal valve. The                            colonoscopy was performed without difficulty. The                            patient tolerated the procedure well. The quality                            of the bowel preparation was good. The ileocecal                            valve, appendiceal orifice, and rectum were                            photographed. Scope In: 8:41:27 AM Scope Out: 8:56:00 AM Scope Withdrawal Time: 0 hours 11 minutes 38 seconds  Total Procedure Duration: 0 hours 14 minutes 33 seconds  Findings:                 The digital rectal exam was normal.                           A 5 mm polyp was found in the ascending colon. The                            polyp was sessile. The polyp was removed with a                            cold snare. Resection and retrieval were complete.                           A 2 mm polyp was found in the descending colon. The                            polyp was sessile. The polyp was  removed with a                            cold biopsy forceps. Resection and retrieval were                            complete.                           A 4 mm polyp was found in the sigmoid colon. The                            polyp was sessile. The polyp was removed with a                            cold snare. Resection and retrieval were complete.                           A 2 mm polyp was found in the sigmoid colon. The                            polyp was sessile. The polyp was removed with a                            cold biopsy forceps. Resection and retrieval were                            complete.                           The exam was otherwise without abnormality on                            direct and retroflexion views. Complications:            No immediate complications. Estimated Blood Loss:     Estimated blood loss was minimal. Impression:               - One 5 mm polyp  in the ascending colon, removed                            with a cold snare. Resected and retrieved.                           - One 2 mm polyp in the descending colon, removed                            with a cold biopsy forceps. Resected and retrieved.                           - One 4 mm polyp in the sigmoid colon, removed with                            a cold snare. Resected and retrieved.                           -  One 2 mm polyp in the sigmoid colon, removed with                            a cold biopsy forceps. Resected and retrieved.                           - The examination was otherwise normal on direct                            and retroflexion views. Recommendation:           - Patient has a contact number available for                            emergencies. The signs and symptoms of potential                            delayed complications were discussed with the                            patient. Return to normal activities tomorrow.                            Written discharge instructions were provided to the                            patient.                           - Resume previous diet.                           - Continue present medications.                           - Await pathology results.                           - Repeat colonoscopy is recommended. The                            colonoscopy date will be determined after pathology                            results from today's exam become available for                            review. Jerene Bears, MD 01/21/2018 8:59:35 AM This report has been signed electronically.

## 2018-01-21 NOTE — Patient Instructions (Signed)
HANDOUT GIVEN FOR POLYPS  YOU HAD AN ENDOSCOPIC PROCEDURE TODAY AT THE [facility]:   Refer to the procedure report that was given to you for any specific questions about what was found during the examination.  If the procedure report does not answer your questions, please call your gastroenterologist to clarify.  If you requested that your care partner not be given the details of your procedure findings, then the procedure report has been included in a sealed envelope for you to review at your convenience later.  YOU SHOULD EXPECT: Some feelings of bloating in the abdomen. Passage of more gas than usual.  Walking can help get rid of the air that was put into your GI tract during the procedure and reduce the bloating. If you had a lower endoscopy (such as a colonoscopy or flexible sigmoidoscopy) you may notice spotting of blood in your stool or on the toilet paper. If you underwent a bowel prep for your procedure, you may not have a normal bowel movement for a few days.  Please Note:  You might notice some irritation and congestion in your nose or some drainage.  This is from the oxygen used during your procedure.  There is no need for concern and it should clear up in a day or so.  SYMPTOMS TO REPORT IMMEDIATELY:   Following lower endoscopy (colonoscopy or flexible sigmoidoscopy):  Excessive amounts of blood in the stool  Significant tenderness or worsening of abdominal pains  Swelling of the abdomen that is new, acute  Fever of 100F or higher  For urgent or emergent issues, a gastroenterologist can be reached at any hour by calling [phone].   DIET:  We do recommend a small meal at first, but then you may proceed to your regular diet.  Drink plenty of fluids but you should avoid alcoholic beverages for 24 hours.  ACTIVITY:  You should plan to take it easy for the rest of today and you should NOT DRIVE or use heavy machinery until tomorrow (because of the sedation medicines  used during the test).    FOLLOW UP: Our staff will call the number listed on your records the next business day following your procedure to check on you and address any questions or concerns that you may have regarding the information given to you following your procedure. If we do not reach you, we will leave a message.  However, if you are feeling well and you are not experiencing any problems, there is no need to return our call.  We will assume that you have returned to your regular daily activities without incident.  If any biopsies were taken you will be contacted by phone or by letter within the next 1-3 weeks.  Please call us at [phone] if you have not heard about the biopsies in 3 weeks.    SIGNATURES/CONFIDENTIALITY: You and/or your care partner have signed paperwork which will be entered into your electronic medical record.  These signatures attest to the fact that that the information above on your After Visit Summary has been reviewed and is understood.  Full responsibility of the confidentiality of this discharge information lies with you and/or your care-partner. 

## 2018-01-21 NOTE — Progress Notes (Signed)
Called to room to assist during endoscopic procedure.  Patient ID and intended procedure confirmed with present staff. Received instructions for my participation in the procedure from the performing physician.  

## 2018-01-24 ENCOUNTER — Telehealth: Payer: Self-pay | Admitting: *Deleted

## 2018-01-24 ENCOUNTER — Ambulatory Visit (INDEPENDENT_AMBULATORY_CARE_PROVIDER_SITE_OTHER): Payer: 59 | Admitting: Family Medicine

## 2018-01-24 VITALS — BP 119/85 | HR 97 | Temp 98.2°F | Ht 65.0 in | Wt 177.0 lb

## 2018-01-24 DIAGNOSIS — Z9189 Other specified personal risk factors, not elsewhere classified: Secondary | ICD-10-CM | POA: Diagnosis not present

## 2018-01-24 DIAGNOSIS — Z6830 Body mass index (BMI) 30.0-30.9, adult: Secondary | ICD-10-CM

## 2018-01-24 DIAGNOSIS — E669 Obesity, unspecified: Secondary | ICD-10-CM

## 2018-01-24 DIAGNOSIS — F3289 Other specified depressive episodes: Secondary | ICD-10-CM | POA: Diagnosis not present

## 2018-01-24 DIAGNOSIS — E559 Vitamin D deficiency, unspecified: Secondary | ICD-10-CM | POA: Diagnosis not present

## 2018-01-24 MED ORDER — VITAMIN D (ERGOCALCIFEROL) 1.25 MG (50000 UNIT) PO CAPS: 50000.0000 [IU] | ORAL_CAPSULE | ORAL | 0 refills | Status: DC

## 2018-01-24 NOTE — Telephone Encounter (Signed)
  Follow up Call-  Call back number 01/21/2018  Post procedure Call Back phone  # [phone]  Permission to leave phone message Yes  Some recent data might be hidden     Patient questions:  Do you have a fever, pain , or abdominal swelling? No. Pain Score  0 *  Have you tolerated food without any problems? Yes.    Have you been able to return to your normal activities? Yes.    Do you have any questions about your discharge instructions: Diet   No. Medications  No. Follow up visit  No.  Do you have questions or concerns about your Care? No.  Actions: * If pain score is 4 or above: No action needed, pain <4.

## 2018-01-25 NOTE — Progress Notes (Signed)
Office: [phone]  /  Fax: [phone]   HPI:   Chief Complaint: OBESITY Yvonne [name] is here to discuss her progress with her obesity treatment plan. She is on the Category 3 plan with breakfast options and is following her eating plan approximately 80 % of the time. She states she is waking for 30 minutes 2 times per week. Keeleigh has been sick with a upper respiratory infection and not eating much, mostly simple carbohydrates to help settle her stomach. She is starting to feel better.  Her weight is 177 lb (80.3 kg) today and has had a weight loss of 4 pounds over a period of 2 weeks since her last visit. She has lost 27 lbs since starting treatment with [place].  Vitamin D Deficiency Mennie has a diagnosis of vitamin D deficiency. She is on prescription Vit D, not yet at goal and due for labs soon. She denies nausea, vomiting or muscle weakness.  At risk for osteopenia and osteoporosis Oleta is at higher risk of osteopenia and osteoporosis due to vitamin D deficiency.   Depression with emotional eating behaviors Shiquita increased Wellbutrin recently, blood pressure is stable and she denies insomnia. Zeya struggles with emotional eating and using food for comfort to the extent that it is negatively impacting her health. She often snacks when she is not hungry. Nieve sometimes feels she is out of control and then feels guilty that she made poor food choices. She has been working on behavior modification techniques to help reduce her emotional eating and has been somewhat successful. She shows no sign of suicidal or homicidal ideations.  Depression screen [facility] 2/9 05/17/2017 04/02/2017  Decreased Interest 3 0  Down, Depressed, Hopeless 0 0  PHQ - 2 Score 3 0  Altered sleeping 3 0  Tired, decreased energy 3 0  Change in appetite 0 1  Feeling bad or failure about yourself  0 0  Trouble concentrating 0 0  Moving slowly or fidgety/restless 0 0  Suicidal thoughts 0 0  PHQ-9 Score 9 1    ALLERGIES: No Known Allergies  MEDICATIONS: Current Outpatient Medications on File Prior to Visit  Medication Sig Dispense Refill  . buPROPion (WELLBUTRIN SR) 200 MG 12 hr tablet Take 1 tablet (200 mg total) by mouth daily at 12 noon. 30 tablet 0  . norethindrone (MICRONOR,CAMILA,ERRIN) 0.35 MG tablet Take 1 tablet by mouth daily.  99  . polyethylene glycol powder (GLYCOLAX/MIRALAX) powder Take 17 g by mouth daily. 3350 g 0   Current Facility-Administered Medications on File Prior to Visit  Medication Dose Route Frequency Provider Last Rate Last Dose  . 0.9 %  sodium chloride infusion  500 mL Intravenous Once Pyrtle, Lajuan Lines, MD        PAST MEDICAL HISTORY: Past Medical History:  Diagnosis Date  . Anemia   . Arthritis   . High blood pressure   . Joint pain   . Kidney stones   . Microscopic hematuria   . Vitamin D deficiency     PAST SURGICAL HISTORY: Past Surgical History:  Procedure Laterality Date  . NO PAST SURGERIES     Denies surgical history    SOCIAL HISTORY: Social History   Tobacco Use  . Smoking status: Never Smoker  . Smokeless tobacco: Never Used  Substance Use Topics  . Alcohol use: No  . Drug use: No    FAMILY HISTORY: Family History  Problem Relation Age of Onset  . Healthy Father   . Congestive Heart Failure  Father   . Heart disease Father   . Other Mother        deceased from multiple myelomas  . Cancer Mother   . Colon cancer Neg Hx   . Esophageal cancer Neg Hx   . Liver cancer Neg Hx   . Pancreatic cancer Neg Hx   . Rectal cancer Neg Hx   . Stomach cancer Neg Hx     ROS: Review of Systems  Constitutional: Positive for weight loss.  Gastrointestinal: Negative for nausea and vomiting.  Musculoskeletal:       Negative muscle weakness  Psychiatric/Behavioral: Positive for depression. Negative for suicidal ideas. The patient does not have insomnia.     PHYSICAL EXAM: Blood pressure 119/85, pulse 97, temperature 98.2 F (36.8 C),  temperature source Oral, height 5\' 5"  (1.651 m), weight 177 lb (80.3 kg), SpO2 99 %. Body mass index is 29.45 kg/m. Physical Exam  Constitutional: She is oriented to person, place, and time. She appears well-developed and well-nourished.  Cardiovascular: Normal rate.  Pulmonary/Chest: Effort normal.  Musculoskeletal: Normal range of motion.  Neurological: She is oriented to person, place, and time.  Skin: Skin is warm and dry.  Psychiatric: She has a normal mood and affect. Her behavior is normal.  Vitals reviewed.   RECENT LABS AND TESTS: BMET    Component Value Date/Time   NA 141 09/13/2017 0829   K 3.5 09/13/2017 0829   CL 101 09/13/2017 0829   CO2 22 09/13/2017 0829   GLUCOSE 92 09/13/2017 0829   GLUCOSE 135 (H) 04/21/2017 0713   BUN 16 09/13/2017 0829   CREATININE 0.88 09/13/2017 0829   CALCIUM 9.7 09/13/2017 0829   GFRNONAA 80 09/13/2017 0829   GFRAA 92 09/13/2017 0829   Lab Results  Component Value Date   HGBA1C 5.7 (H) 09/13/2017   HGBA1C 6.2 (H) 05/17/2017   HGBA1C 5.8 10/12/2014   HGBA1C 5.7 09/14/2011   Lab Results  Component Value Date   INSULIN 35.7 (H) 09/13/2017   INSULIN 27.9 (H) 05/17/2017   CBC    Component Value Date/Time   WBC 11.9 (H) 05/17/2017 1142   WBC 10.3 03/23/2017 0705   RBC 4.48 05/17/2017 1142   RBC 4.59 03/23/2017 0705   HGB 13.7 05/17/2017 1142   HCT 42.1 05/17/2017 1142   PLT 360.0 03/23/2017 0705   MCV 94 05/17/2017 1142   MCH 30.6 05/17/2017 1142   MCHC 32.5 05/17/2017 1142   MCHC 33.6 03/23/2017 0705   RDW 14.2 05/17/2017 1142   LYMPHSABS 3.9 (H) 05/17/2017 1142   MONOABS 0.6 03/23/2017 0705   EOSABS 0.1 05/17/2017 1142   BASOSABS 0.0 05/17/2017 1142   Iron/TIBC/Ferritin/ %Sat No results found for: IRON, TIBC, FERRITIN, IRONPCTSAT Lipid Panel     Component Value Date/Time   CHOL 202 (H) 09/13/2017 0829   TRIG 64 09/13/2017 0829   HDL 55 09/13/2017 0829   CHOLHDL 4 03/23/2017 0705   VLDL 20.8 03/23/2017 0705    LDLCALC 134 (H) 09/13/2017 0829   Hepatic Function Panel     Component Value Date/Time   PROT 7.3 09/13/2017 0829   ALBUMIN 4.4 09/13/2017 0829   AST 15 09/13/2017 0829   ALT 12 09/13/2017 0829   ALKPHOS 100 09/13/2017 0829   BILITOT 0.3 09/13/2017 0829   BILIDIR 0.1 03/23/2017 0705   IBILI 0.2 09/23/2011 0911      Component Value Date/Time   TSH 0.644 05/17/2017 1142   TSH 1.53 03/23/2017 0705   TSH 0.83  03/30/2016 0818  Results for ROYALE, LENNARTZ (MRN [id number]) as of 01/25/2018 08:11  Ref. Range 09/13/2017 08:29  Vitamin D, 25-Hydroxy Latest Ref Range: 30.0 - 100.0 ng/mL 41.3    ASSESSMENT AND PLAN: Vitamin D deficiency - Plan: Vitamin D, Ergocalciferol, (DRISDOL) 50000 units CAPS capsule  Other depression - with emotional eating  At risk for osteoporosis  Class 1 obesity with serious comorbidity and body mass index (BMI) of 30.0 to 30.9 in adult, unspecified obesity type - Starting BMI greater then 30  PLAN:  Vitamin D Deficiency Shawntia was informed that low vitamin D levels contributes to fatigue and are associated with obesity, breast, and colon cancer. Helon agrees to continue taking prescription Vit D @50 ,000 IU every week #4 and we will refill for 1 month. She will follow up for routine testing of vitamin D, at least 2-3 times per year. She was informed of the risk of over-replacement of vitamin D and agrees to not increase her dose unless she discusses this with [place] first. Itzel agrees to follow up with our clinic in 2 to 3 weeks.  At risk for osteopenia and osteoporosis Meylin is at risk for osteopenia and osteoporsis due to her vitamin D deficiency. She was encouraged to take her vitamin D and follow her higher calcium diet and increase strengthening exercise to help strengthen her bones and decrease her risk of osteopenia and osteoporosis.  Depression with Emotional Eating Behaviors We discussed behavior modification techniques today to help [facility] deal with  her emotional eating and depression. Alicea agrees to continue Wellbutrin SR 200 mg qd and she agrees to follow up with our clinic in 2 to 3 weeks.  Obesity Lakishia is currently in the action stage of change. As such, her goal is to continue with weight loss efforts She has agreed to follow the Category 3 plan Seretha has been instructed to work up to a goal of 150 minutes of combined cardio and strengthening exercise per week for weight loss and overall health benefits. We discussed the following Behavioral Modification Strategies today: increasing lean protein intake and increase H20 intake   Marcianna has agreed to follow up with our clinic in 2 to 3 weeks. She was informed of the importance of frequent follow up visits to maximize her success with intensive lifestyle modifications for her multiple health conditions.   OBESITY BEHAVIORAL INTERVENTION VISIT  Today's visit was # 17 out of 22.  Starting weight: 204 lbs Starting date: 05/17/17 Today's weight : 177 lbs  Today's date: 01/24/2018 Total lbs lost to date: 27 (Patients must lose 7 lbs in the first 6 months to continue with counseling)   ASK: We discussed the diagnosis of obesity with Aldona Lento today and Arma agreed to give [place] permission to discuss obesity behavioral modification therapy today.  ASSESS: Lurlie has the diagnosis of obesity and her BMI today is 29.45 Taylorann is in the action stage of change   ADVISE: Kataleya was educated on the multiple health risks of obesity as well as the benefit of weight loss to improve her health. She was advised of the need for long term treatment and the importance of lifestyle modifications.  AGREE: Multiple dietary modification options and treatment options were discussed and  Tanya agreed to the above obesity treatment plan.  I, Trixie Dredge, am acting as transcriptionist for Dennard Nip, MD  I have reviewed the above documentation for accuracy and completeness, and I  agree with the above. -Dennard Nip, MD

## 2018-01-26 ENCOUNTER — Encounter: Payer: Self-pay | Admitting: Internal Medicine

## 2018-01-28 ENCOUNTER — Encounter (INDEPENDENT_AMBULATORY_CARE_PROVIDER_SITE_OTHER): Payer: Self-pay | Admitting: Family Medicine

## 2018-02-15 ENCOUNTER — Ambulatory Visit (INDEPENDENT_AMBULATORY_CARE_PROVIDER_SITE_OTHER): Payer: 59 | Admitting: Family Medicine

## 2018-02-15 VITALS — BP 124/86 | HR 90 | Temp 98.0°F | Ht 65.0 in | Wt 176.0 lb

## 2018-02-15 DIAGNOSIS — Z6830 Body mass index (BMI) 30.0-30.9, adult: Secondary | ICD-10-CM | POA: Diagnosis not present

## 2018-02-15 DIAGNOSIS — E669 Obesity, unspecified: Secondary | ICD-10-CM

## 2018-02-15 DIAGNOSIS — E559 Vitamin D deficiency, unspecified: Secondary | ICD-10-CM | POA: Diagnosis not present

## 2018-02-15 DIAGNOSIS — R7303 Prediabetes: Secondary | ICD-10-CM

## 2018-02-15 DIAGNOSIS — Z9189 Other specified personal risk factors, not elsewhere classified: Secondary | ICD-10-CM | POA: Diagnosis not present

## 2018-02-15 DIAGNOSIS — F3289 Other specified depressive episodes: Secondary | ICD-10-CM

## 2018-02-15 MED ORDER — VITAMIN D (ERGOCALCIFEROL) 1.25 MG (50000 UNIT) PO CAPS: 50000.0000 [IU] | ORAL_CAPSULE | ORAL | 0 refills | Status: DC

## 2018-02-15 MED ORDER — BUPROPION HCL ER (SR) 200 MG PO TB12: 200.0000 mg | ORAL_TABLET | Freq: Every day | ORAL | 0 refills | Status: DC

## 2018-02-15 MED FILL — VIT D2 1.25 MG (50,000 UNIT: 1.25 MG | 28 days supply | Qty: 4 | Fill #0

## 2018-02-15 MED FILL — BUPROPION HCL SR 200 MG TAB: 200 | 30 days supply | Qty: 30 | Fill #0

## 2018-02-15 NOTE — Progress Notes (Signed)
Office: [phone]  /  Fax: [phone]   HPI:   Chief Complaint: OBESITY Yvonne [name] is here to discuss her progress with her obesity treatment plan. She is on the Category 3 plan and is following her eating plan approximately 80 % of the time. She states she is walking for 30-45 minutes 5 times per week. Yvonne [name] has struggled with her eating plan since having braces placed and chewing has been more difficult. This has started to improve in the last 2 to 3 days.  Her weight is 176 lb (79.8 kg) today and has had a weight loss of 1 pound over a period of 3 weeks since her last visit. She has lost 28 lbs since starting treatment with [place].  Vitamin D Deficiency Yvonne [name] has a diagnosis of vitamin D deficiency. She is stable on prescription Vit D, last level not yet at goal. She notes fatigue is improving and denies nausea, vomiting or muscle weakness.  At risk for osteopenia and osteoporosis Yvonne [name] is at higher risk of osteopenia and osteoporosis due to vitamin D deficiency.   Pre-Diabetes Cachet has a diagnosis of pre-diabetes based on her elevated Hgb A1c and was informed this puts her at greater risk of developing diabetes. She is attempting to diet control, polyphagia is controlled. She is not taking metformin currently and continues to work on diet and exercise to decrease risk of diabetes. She denies nausea or hypoglycemia.  Depression with emotional eating behaviors Yvonne [name]'s mood is stable, she continues to work on decreasing emotional eating. Yvonne [name] struggles with emotional eating and using food for comfort to the extent that it is negatively impacting her health. She often snacks when she is not hungry. Yvonne [name] sometimes feels she is out of control and then feels guilty that she made poor food choices. She has been working on behavior modification techniques to help reduce her emotional eating and has been somewhat successful. She shows no sign of suicidal or homicidal ideations.  Depression  screen [name] 2/9 05/17/2017 04/02/2017  Decreased Interest 3 0  Down, Depressed, Hopeless 0 0  PHQ - 2 Score 3 0  Altered sleeping 3 0  Tired, decreased energy 3 0  Change in appetite 0 1  Feeling bad or failure about yourself  0 0  Trouble concentrating 0 0  Moving slowly or fidgety/restless 0 0  Suicidal thoughts 0 0  PHQ-9 Score 9 1   ALLERGIES: No Known Allergies  MEDICATIONS: Current Outpatient Medications on File Prior to Visit  Medication Sig Dispense Refill  . buPROPion (WELLBUTRIN SR) 200 MG 12 hr tablet Take 1 tablet (200 mg total) by mouth daily at 12 noon. 30 tablet 0  . norethindrone (MICRONOR,CAMILA,ERRIN) 0.35 MG tablet Take 1 tablet by mouth daily.  99  . polyethylene glycol powder (GLYCOLAX/MIRALAX) powder Take 17 g by mouth daily. 3350 g 0  . Vitamin D, Ergocalciferol, (DRISDOL) 50000 units CAPS capsule Take 1 capsule (50,000 Units total) by mouth every 7 (seven) days. 4 capsule 0   Current Facility-Administered Medications on File Prior to Visit  Medication Dose Route Frequency Provider Last Rate Last Dose  . 0.9 %  sodium chloride infusion  500 mL Intravenous Once Pyrtle, Lajuan Lines, MD        PAST MEDICAL HISTORY: Past Medical History:  Diagnosis Date  . Anemia   . Arthritis   . High blood pressure   . Joint pain   . Kidney stones   . Microscopic hematuria   . Vitamin D deficiency  PAST SURGICAL HISTORY: Past Surgical History:  Procedure Laterality Date  . NO PAST SURGERIES     Denies surgical history    SOCIAL HISTORY: Social History   Tobacco Use  . Smoking status: Never Smoker  . Smokeless tobacco: Never Used  Substance Use Topics  . Alcohol use: No  . Drug use: No    FAMILY HISTORY: Family History  Problem Relation Age of Onset  . Healthy Father   . Congestive Heart Failure Father   . Heart disease Father   . Other Mother        deceased from multiple myelomas  . Cancer Mother   . Colon cancer Neg Hx   . Esophageal cancer Neg Hx    . Liver cancer Neg Hx   . Pancreatic cancer Neg Hx   . Rectal cancer Neg Hx   . Stomach cancer Neg Hx     ROS: Review of Systems  Constitutional: Positive for malaise/fatigue and weight loss.  Gastrointestinal: Negative for nausea and vomiting.  Musculoskeletal:       Negative muscle weakness  Endo/Heme/Allergies:       Negative polyphagia Negative hypoglycemia  Psychiatric/Behavioral: Positive for depression. Negative for suicidal ideas.    PHYSICAL EXAM: Blood pressure 124/86, pulse 90, temperature 98 F (36.7 C), temperature source Oral, height 5\' 5"  (1.651 m), weight 176 lb (79.8 kg), SpO2 97 %. Body mass index is 29.29 kg/m. Physical Exam  Constitutional: She is oriented to person, place, and time. She appears well-developed and well-nourished.  Cardiovascular: Normal rate.  Pulmonary/Chest: Effort normal.  Musculoskeletal: Normal range of motion.  Neurological: She is oriented to person, place, and time.  Skin: Skin is warm and dry.  Psychiatric: She has a normal mood and affect. Her behavior is normal.  Vitals reviewed.   RECENT LABS AND TESTS: BMET    Component Value Date/Time   NA 141 09/13/2017 0829   K 3.5 09/13/2017 0829   CL 101 09/13/2017 0829   CO2 22 09/13/2017 0829   GLUCOSE 92 09/13/2017 0829   GLUCOSE 135 (H) 04/21/2017 0713   BUN 16 09/13/2017 0829   CREATININE 0.88 09/13/2017 0829   CALCIUM 9.7 09/13/2017 0829   GFRNONAA 80 09/13/2017 0829   GFRAA 92 09/13/2017 0829   Lab Results  Component Value Date   HGBA1C 5.7 (H) 09/13/2017   HGBA1C 6.2 (H) 05/17/2017   HGBA1C 5.8 10/12/2014   HGBA1C 5.7 09/14/2011   Lab Results  Component Value Date   INSULIN 35.7 (H) 09/13/2017   INSULIN 27.9 (H) 05/17/2017   CBC    Component Value Date/Time   WBC 11.9 (H) 05/17/2017 1142   WBC 10.3 03/23/2017 0705   RBC 4.48 05/17/2017 1142   RBC 4.59 03/23/2017 0705   HGB 13.7 05/17/2017 1142   HCT 42.1 05/17/2017 1142   PLT 360.0 03/23/2017 0705     MCV 94 05/17/2017 1142   MCH 30.6 05/17/2017 1142   MCHC 32.5 05/17/2017 1142   MCHC 33.6 03/23/2017 0705   RDW 14.2 05/17/2017 1142   LYMPHSABS 3.9 (H) 05/17/2017 1142   MONOABS 0.6 03/23/2017 0705   EOSABS 0.1 05/17/2017 1142   BASOSABS 0.0 05/17/2017 1142   Iron/TIBC/Ferritin/ %Sat No results found for: IRON, TIBC, FERRITIN, IRONPCTSAT Lipid Panel     Component Value Date/Time   CHOL 202 (H) 09/13/2017 0829   TRIG 64 09/13/2017 0829   HDL 55 09/13/2017 0829   CHOLHDL 4 03/23/2017 0705   VLDL 20.8 03/23/2017 0705  LDLCALC 134 (H) 09/13/2017 0829   Hepatic Function Panel     Component Value Date/Time   PROT 7.3 09/13/2017 0829   ALBUMIN 4.4 09/13/2017 0829   AST 15 09/13/2017 0829   ALT 12 09/13/2017 0829   ALKPHOS 100 09/13/2017 0829   BILITOT 0.3 09/13/2017 0829   BILIDIR 0.1 03/23/2017 0705   IBILI 0.2 09/23/2011 0911      Component Value Date/Time   TSH 0.644 05/17/2017 1142   TSH 1.53 03/23/2017 0705   TSH 0.83 03/30/2016 0818  Results for KARN, DERK (MRN [id number]) as of 02/15/2018 08:38  Ref. Range 09/13/2017 08:29  Vitamin D, 25-Hydroxy Latest Ref Range: 30.0 - 100.0 ng/mL 41.3    ASSESSMENT AND PLAN: Vitamin D deficiency - Plan: VITAMIN D 25 Hydroxy (Vit-D Deficiency, Fractures), Vitamin D, Ergocalciferol, (DRISDOL) 50000 units CAPS capsule  Prediabetes - Plan: Comprehensive metabolic panel, Hemoglobin A1c, Insulin, random, Lipid Panel With LDL/HDL Ratio  Other depression - with emotional eating - Plan: buPROPion (WELLBUTRIN SR) 200 MG 12 hr tablet  At risk for osteoporosis  Class 1 obesity with serious comorbidity and body mass index (BMI) of 30.0 to 30.9 in adult, unspecified obesity type - Starting BMI greater then   PLAN:  Vitamin D Deficiency Jeanene was informed that low vitamin D levels contributes to fatigue and are associated with obesity, breast, and colon cancer. Kassadie agrees to continue taking prescription Vit D @50 ,000 IU every  week #4 and we will refill for 1 month. She will follow up for routine testing of vitamin D, at least 2-3 times per year. She was informed of the risk of over-replacement of vitamin D and agrees to not increase her dose unless she discusses this with [place] first. Derya agrees to follow up with our clinic in 3 weeks.  At risk for osteopenia and osteoporosis Saraia is at risk for osteopenia and osteoporsis due to her vitamin D deficiency. She was encouraged to take her vitamin D and follow her higher calcium diet and increase strengthening exercise to help strengthen her bones and decrease her risk of osteopenia and osteoporosis.  Pre-Diabetes Darbi will continue to work on weight loss, diet, exercise, and decreasing simple carbohydrates in her diet to help decrease the risk of diabetes. We dicussed metformin including benefits and risks. She was informed that eating too many simple carbohydrates or too many calories at one sitting increases the likelihood of GI side effects. Maylie declined metformin for now and a prescription was not written today. We will check labs and Zaila agrees to follow up with our clinic in 3 weeks as directed to monitor her progress.  Depression with Emotional Eating Behaviors We discussed behavior modification techniques today to help [facility] deal with her emotional eating and depression. Anayelli agrees to continue taking Wellbutrin SR 200 mg qd #30 and we will refill for 1 month. Weda agrees to follow up with our clinic in 3 weeks.  Obesity Sumiya is currently in the action stage of change. As such, her goal is to continue with weight loss efforts She has agreed to follow the Category 3 plan Bianco has been instructed to work up to a goal of 150 minutes of combined cardio and strengthening exercise per week for weight loss and overall health benefits. We discussed the following Behavioral Modification Strategies today: increasing lean protein intake and decreasing simple  carbohydrates  We discussed soft protein food options while she is adjusting to her braces.  Rashmi has agreed to follow up  with our clinic in 3 weeks. She was informed of the importance of frequent follow up visits to maximize her success with intensive lifestyle modifications for her multiple health conditions.   OBESITY BEHAVIORAL INTERVENTION VISIT  Today's visit was # 18 out of 22.  Starting weight: 204 lbs Starting date: 05/17/17 Today's weight : 176 lbs  Today's date: 02/15/2018 Total lbs lost to date: 2 (Patients must lose 7 lbs in the first 6 months to continue with counseling)   ASK: We discussed the diagnosis of obesity with Aldona Lento today and Luceil agreed to give [place] permission to discuss obesity behavioral modification therapy today.  ASSESS: Nithya has the diagnosis of obesity and her BMI today is 29.29 Britny is in the action stage of change   ADVISE: Adaisha was educated on the multiple health risks of obesity as well as the benefit of weight loss to improve her health. She was advised of the need for long term treatment and the importance of lifestyle modifications.  AGREE: Multiple dietary modification options and treatment options were discussed and  Bannie agreed to the above obesity treatment plan.  I, Trixie Dredge, am acting as transcriptionist for Dennard Nip, MD  I have reviewed the above documentation for accuracy and completeness, and I agree with the above. -Dennard Nip, MD

## 2018-02-16 LAB — COMPREHENSIVE METABOLIC PANEL
ALT: 13 IU/L (ref 0–32)
AST: 13 IU/L (ref 0–40)
Albumin/Globulin Ratio: 1.7 (ref 1.2–2.2)
Albumin: 4.3 g/dL (ref 3.5–5.5)
Alkaline Phosphatase: 102 IU/L (ref 39–117)
BUN/Creatinine Ratio: 12 (ref 9–23)
BUN: 11 mg/dL (ref 6–24)
Bilirubin Total: 0.3 mg/dL (ref 0.0–1.2)
CALCIUM: 9.8 mg/dL (ref 8.7–10.2)
CO2: 20 mmol/L (ref 20–29)
CREATININE: 0.94 mg/dL (ref 0.57–1.00)
Chloride: 106 mmol/L (ref 96–106)
GFR calc Af Amer: 85 mL/min/{1.73_m2} (ref >59)
GFR, EST NON AFRICAN AMERICAN: 74 mL/min/{1.73_m2} (ref >59)
GLOBULIN, TOTAL: 2.6 g/dL (ref 1.5–4.5)
Glucose: 98 mg/dL (ref 65–99)
Potassium: 4.5 mmol/L (ref 3.5–5.2)
Sodium: 142 mmol/L (ref 134–144)
Total Protein: 6.9 g/dL (ref 6.0–8.5)

## 2018-02-16 LAB — LIPID PANEL WITH LDL/HDL RATIO
CHOLESTEROL TOTAL: 213 mg/dL — AB (ref 100–199)
HDL: 62 mg/dL (ref >39)
LDL Calculated: 140 mg/dL — ABNORMAL HIGH (ref 0–99)
LDl/HDL Ratio: 2.3 ratio (ref 0.0–3.2)
Triglycerides: 57 mg/dL (ref 0–149)
VLDL CHOLESTEROL CAL: 11 mg/dL (ref 5–40)

## 2018-02-16 LAB — INSULIN, RANDOM: INSULIN: 28.8 u[IU]/mL — AB (ref 2.6–24.9)

## 2018-02-16 LAB — HEMOGLOBIN A1C
ESTIMATED AVERAGE GLUCOSE: 114 mg/dL
Hgb A1c MFr Bld: 5.6 % (ref 4.8–5.6)

## 2018-02-16 LAB — VITAMIN D 25 HYDROXY (VIT D DEFICIENCY, FRACTURES): Vit D, 25-Hydroxy: 51.1 ng/mL (ref 30.0–100.0)

## 2018-02-24 ENCOUNTER — Encounter (INDEPENDENT_AMBULATORY_CARE_PROVIDER_SITE_OTHER): Payer: Self-pay | Admitting: Family Medicine

## 2018-03-04 LAB — HM MAMMOGRAPHY: HM Mammogram: NORMAL (ref 0–4)

## 2018-03-05 DIAGNOSIS — Z1231 Encounter for screening mammogram for malignant neoplasm of breast: Secondary | ICD-10-CM | POA: Diagnosis not present

## 2018-03-07 ENCOUNTER — Ambulatory Visit (INDEPENDENT_AMBULATORY_CARE_PROVIDER_SITE_OTHER): Payer: 59 | Admitting: Family Medicine

## 2018-03-07 VITALS — BP 119/84 | HR 89 | Temp 98.3°F | Ht 65.0 in | Wt 177.0 lb

## 2018-03-07 DIAGNOSIS — E559 Vitamin D deficiency, unspecified: Secondary | ICD-10-CM

## 2018-03-07 DIAGNOSIS — Z9189 Other specified personal risk factors, not elsewhere classified: Secondary | ICD-10-CM

## 2018-03-07 DIAGNOSIS — Z6830 Body mass index (BMI) 30.0-30.9, adult: Secondary | ICD-10-CM

## 2018-03-07 DIAGNOSIS — E669 Obesity, unspecified: Secondary | ICD-10-CM

## 2018-03-07 DIAGNOSIS — F3289 Other specified depressive episodes: Secondary | ICD-10-CM

## 2018-03-07 MED ORDER — VITAMIN D (ERGOCALCIFEROL) 1.25 MG (50000 UNIT) PO CAPS: 50000.0000 [IU] | ORAL_CAPSULE | ORAL | 0 refills | Status: DC

## 2018-03-07 MED ORDER — BUPROPION HCL ER (SR) 200 MG PO TB12: 200.0000 mg | ORAL_TABLET | Freq: Every day | ORAL | 0 refills | Status: DC

## 2018-03-07 MED FILL — NORETHINDRONE 0.35 MG TAB: 0.35 | 84 days supply | Qty: 84 | Fill #4

## 2018-03-08 ENCOUNTER — Encounter (INDEPENDENT_AMBULATORY_CARE_PROVIDER_SITE_OTHER): Payer: Self-pay

## 2018-03-09 NOTE — Progress Notes (Signed)
Office: [phone]  /  Fax: [phone]   HPI:   Chief Complaint: OBESITY Yvonne [name] is here to discuss her progress with her obesity treatment plan. She is on the Category 3 plan and is following her eating plan approximately 80 % of the time. She states she is walking for 30 minutes 5 times per week. Yvonne [name] has had increase cravings in the last 2 to 3 weeks and is getting bored with lunch options. She would like more freedom.  Her weight is 177 lb (80.3 kg) today and has gained 1 pound since her last visit. She has lost 27 lbs since starting treatment with [place].  Vitamin D Deficiency Yvonne [name] has a diagnosis of vitamin D deficiency. She is stable on prescription Vit D and denies nausea, vomiting or muscle weakness.  Depression with emotional eating behaviors Yvonne [name]'s mood is stable on Wellbutrin, she denies insomnia and blood pressure is stable. Yvonne [name] struggles with emotional eating and using food for comfort to the extent that it is negatively impacting her health. She often snacks when she is not hungry. Yvonne [name] sometimes feels she is out of control and then feels guilty that she made poor food choices. She has been working on behavior modification techniques to help reduce her emotional eating and has been somewhat successful. She shows no sign of suicidal or homicidal ideations.  Depression screen [facility] 2/9 05/17/2017 04/02/2017  Decreased Interest 3 0  Down, Depressed, Hopeless 0 0  PHQ - 2 Score 3 0  Altered sleeping 3 0  Tired, decreased energy 3 0  Change in appetite 0 1  Feeling bad or failure about yourself  0 0  Trouble concentrating 0 0  Moving slowly or fidgety/restless 0 0  Suicidal thoughts 0 0  PHQ-9 Score 9 1   At risk for cardiovascular disease Yvonne [name] is at a higher than average risk for cardiovascular disease due to obesity. She currently denies any chest pain.  ALLERGIES: No Known Allergies  MEDICATIONS: Current Outpatient Medications on File Prior to Visit    Medication Sig Dispense Refill  . norethindrone (MICRONOR,CAMILA,ERRIN) 0.35 MG tablet Take 1 tablet by mouth daily.  99  . polyethylene glycol powder (GLYCOLAX/MIRALAX) powder Take 17 g by mouth daily. 3350 g 0   Current Facility-Administered Medications on File Prior to Visit  Medication Dose Route Frequency Provider Last Rate Last Dose  . 0.9 %  sodium chloride infusion  500 mL Intravenous Once Pyrtle, Lajuan Lines, MD        PAST MEDICAL HISTORY: Past Medical History:  Diagnosis Date  . Anemia   . Arthritis   . High blood pressure   . Joint pain   . Kidney stones   . Microscopic hematuria   . Vitamin D deficiency     PAST SURGICAL HISTORY: Past Surgical History:  Procedure Laterality Date  . NO PAST SURGERIES     Denies surgical history    SOCIAL HISTORY: Social History   Tobacco Use  . Smoking status: Never Smoker  . Smokeless tobacco: Never Used  Substance Use Topics  . Alcohol use: No  . Drug use: No    FAMILY HISTORY: Family History  Problem Relation Age of Onset  . Healthy Father   . Congestive Heart Failure Father   . Heart disease Father   . Other Mother        deceased from multiple myelomas  . Cancer Mother   . Colon cancer Neg Hx   . Esophageal cancer Neg Hx   .  Liver cancer Neg Hx   . Pancreatic cancer Neg Hx   . Rectal cancer Neg Hx   . Stomach cancer Neg Hx     ROS: Review of Systems  Constitutional: Negative for weight loss.  Cardiovascular: Negative for chest pain.  Gastrointestinal: Negative for nausea and vomiting.  Musculoskeletal:       Negative muscle weakness  Psychiatric/Behavioral: Positive for depression. Negative for suicidal ideas. The patient does not have insomnia.     PHYSICAL EXAM: Blood pressure 119/84, pulse 89, temperature 98.3 F (36.8 C), temperature source Oral, height 5\' 5"  (1.651 m), weight 177 lb (80.3 kg), SpO2 97 %. Body mass index is 29.45 kg/m. Physical Exam  Constitutional: She is oriented to person,  place, and time. She appears well-developed and well-nourished.  Cardiovascular: Normal rate.  Pulmonary/Chest: Effort normal.  Musculoskeletal: Normal range of motion.  Neurological: She is oriented to person, place, and time.  Skin: Skin is warm and dry.  Psychiatric: She has a normal mood and affect. Her behavior is normal.  Vitals reviewed.   RECENT LABS AND TESTS: BMET    Component Value Date/Time   NA 142 02/15/2018 0817   K 4.5 02/15/2018 0817   CL 106 02/15/2018 0817   CO2 20 02/15/2018 0817   GLUCOSE 98 02/15/2018 0817   GLUCOSE 135 (H) 04/21/2017 0713   BUN 11 02/15/2018 0817   CREATININE 0.94 02/15/2018 0817   CALCIUM 9.8 02/15/2018 0817   GFRNONAA 74 02/15/2018 0817   GFRAA 85 02/15/2018 0817   Lab Results  Component Value Date   HGBA1C 5.6 02/15/2018   HGBA1C 5.7 (H) 09/13/2017   HGBA1C 6.2 (H) 05/17/2017   HGBA1C 5.8 10/12/2014   HGBA1C 5.7 09/14/2011   Lab Results  Component Value Date   INSULIN 28.8 (H) 02/15/2018   INSULIN 35.7 (H) 09/13/2017   INSULIN 27.9 (H) 05/17/2017   CBC    Component Value Date/Time   WBC 11.9 (H) 05/17/2017 1142   WBC 10.3 03/23/2017 0705   RBC 4.48 05/17/2017 1142   RBC 4.59 03/23/2017 0705   HGB 13.7 05/17/2017 1142   HCT 42.1 05/17/2017 1142   PLT 360.0 03/23/2017 0705   MCV 94 05/17/2017 1142   MCH 30.6 05/17/2017 1142   MCHC 32.5 05/17/2017 1142   MCHC 33.6 03/23/2017 0705   RDW 14.2 05/17/2017 1142   LYMPHSABS 3.9 (H) 05/17/2017 1142   MONOABS 0.6 03/23/2017 0705   EOSABS 0.1 05/17/2017 1142   BASOSABS 0.0 05/17/2017 1142   Iron/TIBC/Ferritin/ %Sat No results found for: IRON, TIBC, FERRITIN, IRONPCTSAT Lipid Panel     Component Value Date/Time   CHOL 213 (H) 02/15/2018 0817   TRIG 57 02/15/2018 0817   HDL 62 02/15/2018 0817   CHOLHDL 4 03/23/2017 0705   VLDL 20.8 03/23/2017 0705   LDLCALC 140 (H) 02/15/2018 0817   Hepatic Function Panel     Component Value Date/Time   PROT 6.9 02/15/2018 0817    ALBUMIN 4.3 02/15/2018 0817   AST 13 02/15/2018 0817   ALT 13 02/15/2018 0817   ALKPHOS 102 02/15/2018 0817   BILITOT 0.3 02/15/2018 0817   BILIDIR 0.1 03/23/2017 0705   IBILI 0.2 09/23/2011 0911      Component Value Date/Time   TSH 0.644 05/17/2017 1142   TSH 1.53 03/23/2017 0705   TSH 0.83 03/30/2016 0818  Results for Nimra, Puccinelli Kynadee D (MRN [id number]) as of 03/09/2018 10:36  Ref. Range 02/15/2018 08:17  Vitamin D, 25-Hydroxy Latest Ref Range: 30.0 -  100.0 ng/mL 51.1    ASSESSMENT AND PLAN: Vitamin D deficiency - Plan: Vitamin D, Ergocalciferol, (DRISDOL) 50000 units CAPS capsule  Other depression - with emotional eating - Plan: buPROPion (WELLBUTRIN SR) 200 MG 12 hr tablet  At risk for heart disease  Class 1 obesity with serious comorbidity and body mass index (BMI) of 30.0 to 30.9 in adult, unspecified obesity type - Starting BMI greater then 30  PLAN:  Vitamin D Deficiency Iliza was informed that low vitamin D levels contributes to fatigue and are associated with obesity, breast, and colon cancer. Keandrea agrees to continue taking prescription Vit D @50 ,000 IU every week #4 and we will refill for 1 month. She will follow up for routine testing of vitamin D, at least 2-3 times per year. She was informed of the risk of over-replacement of vitamin D and agrees to not increase her dose unless she discusses this with [place] first. Leigha agrees to follow up with our clinic in 2 to 3 weeks.  Depression with Emotional Eating Behaviors We discussed behavior modification techniques today to help [facility] deal with her emotional eating and depression. Keaton agrees to continue taking Wellbutrin SR 200 mg qd #30 and we will refill for 1 month. Rakisha agrees to follow up with our clinic in 2 to 3 weeks.  Cardiovascular risk counselling Dani was given extended (15 minutes) coronary artery disease prevention counseling today. She is 47 y.o. female and has risk factors for heart disease including  obesity. We discussed intensive lifestyle modifications today with an emphasis on specific weight loss instructions and strategies. Pt was also informed of the importance of increasing exercise and decreasing saturated fats to help prevent heart disease.  Obesity Kehlani is currently in the action stage of change. As such, her goal is to continue with weight loss efforts She has agreed to keep a food journal with 300-500 calories and 35 grams of protein at lunch daily and follow the Category [place] has been instructed to work up to a goal of 150 minutes of combined cardio and strengthening exercise per week for weight loss and overall health benefits. We discussed the following Behavioral Modification Strategies today: increasing lean protein intake, decrease eating out, no skipping meals, and keep a strict food journal   Amayah has agreed to follow up with our clinic in 2 to 3 weeks. She was informed of the importance of frequent follow up visits to maximize her success with intensive lifestyle modifications for her multiple health conditions.   OBESITY BEHAVIORAL INTERVENTION VISIT  Today's visit was # 19 out of 22.  Starting weight: 204 lbs Starting date: 05/17/17 Today's weight : 177 lbs  Today's date: 03/07/2018 Total lbs lost to date: 15 (Patients must lose 7 lbs in the first 6 months to continue with counseling)   ASK: We discussed the diagnosis of obesity with Aldona Lento today and Kaiulani agreed to give [place] permission to discuss obesity behavioral modification therapy today.  ASSESS: Korine has the diagnosis of obesity and her BMI today is 29.45 Daryle is in the action stage of change   ADVISE: Julianny was educated on the multiple health risks of obesity as well as the benefit of weight loss to improve her health. She was advised of the need for long term treatment and the importance of lifestyle modifications.  AGREE: Multiple dietary modification options and  treatment options were discussed and  Khadeeja agreed to the above obesity treatment plan.  Wilhemena Durie, am  acting as transcriptionist for Dennard Nip, MD  I have reviewed the above documentation for accuracy and completeness, and I agree with the above. -Dennard Nip, MD

## 2018-03-18 ENCOUNTER — Telehealth: Payer: Self-pay | Admitting: Family

## 2018-03-18 DIAGNOSIS — Z807 Family history of other malignant neoplasms of lymphoid, hematopoietic and related tissues: Secondary | ICD-10-CM

## 2018-03-18 DIAGNOSIS — Z0000 Encounter for general adult medical examination without abnormal findings: Secondary | ICD-10-CM

## 2018-03-18 NOTE — Telephone Encounter (Signed)
Caller name: Relation to MK:LKJZ Call back number:[phone] Pharmacy:  Reason for call: pt would like to know if Lenna Sciara can put her lab orders in on 03/30/18 prior to her CPE on 04/04/18 also would like to know if she can put the order in for the multiple myloma cancer screening that she gets every year due to her mom having cancer.

## 2018-03-21 MED FILL — VIT D2 1.25 MG (50,000 UNIT: 1.25 MG | 28 days supply | Qty: 4 | Fill #0

## 2018-03-21 MED FILL — BUPROPION HCL SR 200 MG TAB: 200 | 30 days supply | Qty: 30 | Fill #0

## 2018-03-23 NOTE — Telephone Encounter (Signed)
Orders entered and lab appt scheduled for 03/30/18 at 7:25am.  Debbrah Alar, NP  Ronny Flurry, CMA        Can you please put physical labs in for the patient. There is a my chart message going on her. She needs CBC with diff, basic metabolic panel, hepatic function panel, urinalysis with micro, lipid panel, TSH, and serum protein electrophoresis. Diagnosis for SPEP is family history of multiple myeloma.

## 2018-03-28 ENCOUNTER — Ambulatory Visit (INDEPENDENT_AMBULATORY_CARE_PROVIDER_SITE_OTHER): Payer: 59 | Admitting: Family Medicine

## 2018-03-28 VITALS — BP 120/83 | HR 89 | Temp 98.3°F | Ht 65.0 in | Wt 178.0 lb

## 2018-03-28 DIAGNOSIS — E669 Obesity, unspecified: Secondary | ICD-10-CM

## 2018-03-28 DIAGNOSIS — E559 Vitamin D deficiency, unspecified: Secondary | ICD-10-CM | POA: Diagnosis not present

## 2018-03-28 DIAGNOSIS — Z6830 Body mass index (BMI) 30.0-30.9, adult: Secondary | ICD-10-CM

## 2018-03-29 NOTE — Progress Notes (Signed)
Office: [phone]  /  Fax: [phone]   HPI:   Chief Complaint: OBESITY Dayane is here to discuss her progress with her obesity treatment plan. She is on the keep a food journal with 300 to 500 calories and 35 grams of protein at lunch daily and the Category 3 plan and is following her eating plan approximately 80 % of the time. She states she is exercising 0 minutes 0 times per week. Aylee was given the option to journal for lunch, but found the decrease in structure did not work well for her. Her weight is 178 lb (80.7 kg) today and has had a weight gain of 1 pound over a period of 3 weeks since her last visit. She has lost 26 lbs since starting treatment with [place].  Vitamin D deficiency Jia has a diagnosis of vitamin D deficiency. She is stable on vit D but is not yet at goal. She denies nausea, vomiting or muscle weakness.  ALLERGIES: No Known Allergies  MEDICATIONS: Current Outpatient Medications on File Prior to Visit  Medication Sig Dispense Refill  . buPROPion (WELLBUTRIN SR) 200 MG 12 hr tablet Take 1 tablet (200 mg total) by mouth daily at 12 noon. 30 tablet 0  . norethindrone (MICRONOR,CAMILA,ERRIN) 0.35 MG tablet Take 1 tablet by mouth daily.  99  . polyethylene glycol powder (GLYCOLAX/MIRALAX) powder Take 17 g by mouth daily. 3350 g 0  . Vitamin D, Ergocalciferol, (DRISDOL) 50000 units CAPS capsule Take 1 capsule (50,000 Units total) by mouth every 7 (seven) days. 4 capsule 0   Current Facility-Administered Medications on File Prior to Visit  Medication Dose Route Frequency Provider Last Rate Last Dose  . 0.9 %  sodium chloride infusion  500 mL Intravenous Once Pyrtle, Lajuan Lines, MD        PAST MEDICAL HISTORY: Past Medical History:  Diagnosis Date  . Anemia   . Arthritis   . High blood pressure   . Joint pain   . Kidney stones   . Microscopic hematuria   . Vitamin D deficiency     PAST SURGICAL HISTORY: Past Surgical History:  Procedure Laterality Date    . NO PAST SURGERIES     Denies surgical history    SOCIAL HISTORY: Social History   Tobacco Use  . Smoking status: Never Smoker  . Smokeless tobacco: Never Used  Substance Use Topics  . Alcohol use: No  . Drug use: No    FAMILY HISTORY: Family History  Problem Relation Age of Onset  . Healthy Father   . Congestive Heart Failure Father   . Heart disease Father   . Other Mother        deceased from multiple myelomas  . Cancer Mother   . Colon cancer Neg Hx   . Esophageal cancer Neg Hx   . Liver cancer Neg Hx   . Pancreatic cancer Neg Hx   . Rectal cancer Neg Hx   . Stomach cancer Neg Hx     ROS: Review of Systems  Constitutional: Negative for weight loss.  Gastrointestinal: Negative for nausea and vomiting.  Musculoskeletal:       Negative for muscle weakness    PHYSICAL EXAM: Blood pressure 120/83, pulse 89, temperature 98.3 F (36.8 C), height 5\' 5"  (1.651 m), weight 178 lb (80.7 kg), SpO2 99 %. Body mass index is 29.62 kg/m. Physical Exam  Constitutional: She is oriented to person, place, and time. She appears well-developed and well-nourished.  Cardiovascular: Normal rate.  Pulmonary/Chest: Effort normal.  Musculoskeletal: Normal range of motion.  Neurological: She is oriented to person, place, and time.  Skin: Skin is warm and dry.  Psychiatric: She has a normal mood and affect. Her behavior is normal.  Vitals reviewed.   RECENT LABS AND TESTS: BMET    Component Value Date/Time   NA 142 02/15/2018 0817   K 4.5 02/15/2018 0817   CL 106 02/15/2018 0817   CO2 20 02/15/2018 0817   GLUCOSE 98 02/15/2018 0817   GLUCOSE 135 (H) 04/21/2017 0713   BUN 11 02/15/2018 0817   CREATININE 0.94 02/15/2018 0817   CALCIUM 9.8 02/15/2018 0817   GFRNONAA 74 02/15/2018 0817   GFRAA 85 02/15/2018 0817   Lab Results  Component Value Date   HGBA1C 5.6 02/15/2018   HGBA1C 5.7 (H) 09/13/2017   HGBA1C 6.2 (H) 05/17/2017   HGBA1C 5.8 10/12/2014   HGBA1C 5.7  09/14/2011   Lab Results  Component Value Date   INSULIN 28.8 (H) 02/15/2018   INSULIN 35.7 (H) 09/13/2017   INSULIN 27.9 (H) 05/17/2017   CBC    Component Value Date/Time   WBC 11.9 (H) 05/17/2017 1142   WBC 10.3 03/23/2017 0705   RBC 4.48 05/17/2017 1142   RBC 4.59 03/23/2017 0705   HGB 13.7 05/17/2017 1142   HCT 42.1 05/17/2017 1142   PLT 360.0 03/23/2017 0705   MCV 94 05/17/2017 1142   MCH 30.6 05/17/2017 1142   MCHC 32.5 05/17/2017 1142   MCHC 33.6 03/23/2017 0705   RDW 14.2 05/17/2017 1142   LYMPHSABS 3.9 (H) 05/17/2017 1142   MONOABS 0.6 03/23/2017 0705   EOSABS 0.1 05/17/2017 1142   BASOSABS 0.0 05/17/2017 1142   Iron/TIBC/Ferritin/ %Sat No results found for: IRON, TIBC, FERRITIN, IRONPCTSAT Lipid Panel     Component Value Date/Time   CHOL 213 (H) 02/15/2018 0817   TRIG 57 02/15/2018 0817   HDL 62 02/15/2018 0817   CHOLHDL 4 03/23/2017 0705   VLDL 20.8 03/23/2017 0705   LDLCALC 140 (H) 02/15/2018 0817   Hepatic Function Panel     Component Value Date/Time   PROT 6.9 02/15/2018 0817   ALBUMIN 4.3 02/15/2018 0817   AST 13 02/15/2018 0817   ALT 13 02/15/2018 0817   ALKPHOS 102 02/15/2018 0817   BILITOT 0.3 02/15/2018 0817   BILIDIR 0.1 03/23/2017 0705   IBILI 0.2 09/23/2011 0911      Component Value Date/Time   TSH 0.644 05/17/2017 1142   TSH 1.53 03/23/2017 0705   TSH 0.83 03/30/2016 0818   Results for Kanai, Hilger Amaiyah D (MRN [id number]) as of 03/29/2018 13:51  Ref. Range 02/15/2018 08:17  Vitamin D, 25-Hydroxy Latest Ref Range: 30.0 - 100.0 ng/mL 51.1   ASSESSMENT AND PLAN: Vitamin D deficiency  Class 1 obesity with serious comorbidity and body mass index (BMI) of 30.0 to 30.9 in adult, unspecified obesity type - Starting BMI greater then 30  PLAN:  Vitamin D Deficiency Angell was informed that low vitamin D levels contributes to fatigue and are associated with obesity, breast, and colon cancer. She agrees to continue to take prescription Vit D  @50 ,000 IU every week and we will recheck labs in 6 weeks. Cruzita will follow up for routine testing of vitamin D, at least 2-3 times per year. She was informed of the risk of over-replacement of vitamin D and agrees to not increase her dose unless she discusses this with [place] first.  We spent > than 50% of the 15 minute  visit on the counseling as documented in the note.  Obesity Elley is currently in the action stage of change. As such, her goal is to continue with weight loss efforts She has agreed to continue with the Category 3 plan for all meals but keep a food journal with 300 to 500 calories and 35+ grams of protein all other times. Tashe is to continue with the category 3 plan but when she deviates, she should journal for her meals. Tessa has been instructed to work up to a goal of 150 minutes of combined cardio and strengthening exercise per week for weight loss and overall health benefits. We discussed the following Behavioral Modification Strategies today: increasing lean protein intake, decreasing simple carbohydrates  and work on meal planning and easy cooking plans  Heavyn has agreed to follow up with our clinic in 2 to 3 weeks. She was informed of the importance of frequent follow up visits to maximize her success with intensive lifestyle modifications for her multiple health conditions.   OBESITY BEHAVIORAL INTERVENTION VISIT  Today's visit was # 20 out of 22.  Starting weight: 204 lbs Starting date: 05/17/17 Today's weight : 178 lbs  Today's date: 03/28/2018 Total lbs lost to date: 8 (Patients must lose 7 lbs in the first 6 months to continue with counseling)   ASK: We discussed the diagnosis of obesity with Aldona Lento today and Lemon agreed to give [place] permission to discuss obesity behavioral modification therapy today.  ASSESS: Havanna has the diagnosis of obesity and her BMI today is 29.62 Malerie is in the action stage of change   ADVISE: Ria was educated  on the multiple health risks of obesity as well as the benefit of weight loss to improve her health. She was advised of the need for long term treatment and the importance of lifestyle modifications.  AGREE: Multiple dietary modification options and treatment options were discussed and  Lyndie agreed to the above obesity treatment plan.  I, Doreene Nest, am acting as transcriptionist for Dennard Nip, MD  I have reviewed the above documentation for accuracy and completeness, and I agree with the above. -Dennard Nip, MD

## 2018-03-30 ENCOUNTER — Other Ambulatory Visit (INDEPENDENT_AMBULATORY_CARE_PROVIDER_SITE_OTHER): Payer: 59

## 2018-03-30 DIAGNOSIS — Z807 Family history of other malignant neoplasms of lymphoid, hematopoietic and related tissues: Secondary | ICD-10-CM

## 2018-03-30 DIAGNOSIS — Z0000 Encounter for general adult medical examination without abnormal findings: Secondary | ICD-10-CM

## 2018-03-30 LAB — CBC WITH DIFFERENTIAL/PLATELET
BASOS ABS: 0.1 10*3/uL (ref 0.0–0.1)
Basophils Relative: 0.7 % (ref 0.0–3.0)
Eosinophils Absolute: 0.1 10*3/uL (ref 0.0–0.7)
Eosinophils Relative: 1.6 % (ref 0.0–5.0)
HCT: 41.6 % (ref 36.0–46.0)
HEMOGLOBIN: 13.9 g/dL (ref 12.0–15.0)
LYMPHS ABS: 3 10*3/uL (ref 0.7–4.0)
LYMPHS PCT: 38.8 % (ref 12.0–46.0)
MCHC: 33.5 g/dL (ref 30.0–36.0)
MCV: 95.4 fl (ref 78.0–100.0)
MONOS PCT: 7.8 % (ref 3.0–12.0)
Monocytes Absolute: 0.6 10*3/uL (ref 0.1–1.0)
NEUTROS PCT: 51.1 % (ref 43.0–77.0)
Neutro Abs: 4 10*3/uL (ref 1.4–7.7)
Platelets: 323 10*3/uL (ref 150.0–400.0)
RBC: 4.36 Mil/uL (ref 3.87–5.11)
RDW: 15 % (ref 11.5–15.5)
WBC: 7.7 10*3/uL (ref 4.0–10.5)

## 2018-03-30 LAB — HEPATIC FUNCTION PANEL
ALBUMIN: 4.3 g/dL (ref 3.5–5.2)
ALT: 14 U/L (ref 0–35)
AST: 16 U/L (ref 0–37)
Alkaline Phosphatase: 87 U/L (ref 39–117)
Bilirubin, Direct: 0.1 mg/dL (ref 0.0–0.3)
TOTAL PROTEIN: 7.4 g/dL (ref 6.0–8.3)
Total Bilirubin: 0.5 mg/dL (ref 0.2–1.2)

## 2018-03-30 LAB — URINALYSIS, ROUTINE W REFLEX MICROSCOPIC
Bilirubin Urine: NEGATIVE
KETONES UR: NEGATIVE
Leukocytes, UA: NEGATIVE
Nitrite: NEGATIVE
SPECIFIC GRAVITY, URINE: 1.02 (ref 1.000–1.030)
TOTAL PROTEIN, URINE-UPE24: NEGATIVE
UROBILINOGEN UA: 0.2 (ref 0.0–1.0)
Urine Glucose: NEGATIVE
pH: 6 (ref 5.0–8.0)

## 2018-03-30 LAB — BASIC METABOLIC PANEL
BUN: 17 mg/dL (ref 6–23)
CALCIUM: 9.5 mg/dL (ref 8.4–10.5)
CO2: 24 mEq/L (ref 19–32)
Chloride: 107 mEq/L (ref 96–112)
Creatinine, Ser: 0.84 mg/dL (ref 0.40–1.20)
GFR: 94.06 mL/min (ref >60.00)
Glucose, Bld: 86 mg/dL (ref 70–99)
POTASSIUM: 4.3 meq/L (ref 3.5–5.1)
Sodium: 137 mEq/L (ref 135–145)

## 2018-03-30 LAB — TSH: TSH: 1.32 u[IU]/mL (ref 0.35–4.50)

## 2018-03-30 LAB — LIPID PANEL
CHOLESTEROL: 199 mg/dL (ref 0–200)
HDL: 69.7 mg/dL (ref >39.00)
LDL CALC: 116 mg/dL — AB (ref 0–99)
NONHDL: 129.16
Total CHOL/HDL Ratio: 3
Triglycerides: 66 mg/dL (ref 0.0–149.0)
VLDL: 13.2 mg/dL (ref 0.0–40.0)

## 2018-04-01 LAB — PROTEIN ELECTROPHORESIS, SERUM, WITH REFLEX
ALPHA 1: 0.3 g/dL (ref 0.2–0.3)
ALPHA 2: 0.7 g/dL (ref 0.5–0.9)
Albumin ELP: 4.3 g/dL (ref 3.8–4.8)
Beta 2: 0.4 g/dL (ref 0.2–0.5)
Beta Globulin: 0.4 g/dL (ref 0.4–0.6)
Gamma Globulin: 1 g/dL (ref 0.8–1.7)
TOTAL PROTEIN: 7.1 g/dL (ref 6.1–8.1)

## 2018-04-03 ENCOUNTER — Encounter: Payer: Self-pay | Admitting: Family

## 2018-04-04 ENCOUNTER — Ambulatory Visit (INDEPENDENT_AMBULATORY_CARE_PROVIDER_SITE_OTHER): Payer: 59 | Admitting: Family

## 2018-04-04 ENCOUNTER — Encounter: Payer: Self-pay | Admitting: Family

## 2018-04-04 ENCOUNTER — Encounter: Payer: 59 | Admitting: Family

## 2018-04-04 VITALS — BP 116/80 | HR 88 | Temp 98.6°F | Resp 16 | Ht 65.5 in | Wt 180.4 lb

## 2018-04-04 DIAGNOSIS — Z0000 Encounter for general adult medical examination without abnormal findings: Secondary | ICD-10-CM | POA: Diagnosis not present

## 2018-04-04 NOTE — Progress Notes (Signed)
Subjective:    Patient ID: Yvonne [name], female    DOB: 1971/09/22, 47 y.o.   MRN: [id number]  HPI  Yvonne [name] is a 47 yr old female who presents today for complete physical.  Immunizations: 10/18/14 Diet: has lost 27 pounds this year Exercise: walks Colonoscopy: 01/21/18 Pap Smear: 5/18 Mammogram:  3/19 Vision: 6/18 Dental: 2/19   Wt Readings from Last 3 Encounters:  04/04/18 180 lb 6.4 oz (81.8 kg)  03/28/18 178 lb (80.7 kg)  03/07/18 177 lb (80.3 kg)     Review of Systems  Constitutional: Negative for unexpected weight change.  HENT: Negative for hearing loss and rhinorrhea.   Eyes: Negative for visual disturbance.  Respiratory: Negative for cough.   Cardiovascular: Negative for leg swelling.  Gastrointestinal: Negative for blood in stool, constipation and diarrhea.  Genitourinary: Negative for dysuria and frequency.  Musculoskeletal: Negative for arthralgias and myalgias.  Neurological: Negative for headaches.  Hematological: Negative for adenopathy.  Psychiatric/Behavioral:       Denies depression/anxiety       Past Medical History:  Diagnosis Date  . Anemia   . Arthritis   . High blood pressure   . Joint pain   . Kidney stones   . Microscopic hematuria   . Vitamin D deficiency      Social History   Socioeconomic History  . Marital status: Married    Spouse name: Not on file  . Number of children: 3  . Years of education: Not on file  . Highest education level: Not on file  Occupational History  . 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 Financial resource strain: Not on file  . Food insecurity:    Worry: Not on file    Inability: Not on file  . Transportation needs:    Medical: Not on file    Non-medical: Not on file  Tobacco Use  . Smoking status: Never Smoker  . Smokeless tobacco: Never Used  Substance and Sexual Activity  . Alcohol use: No  . Drug use: No  . Sexual activity: Not on file    Lifestyle  . Physical activity:    Days per week: Not on file    Minutes per session: Not on file  . Stress: Not on file  Relationships  . Social connections:    Talks on phone: Not on file    Gets together: Not on file    Attends religious service: Not on file    Active member of club or organization: Not on file    Attends meetings of clubs or organizations: Not on file    Relationship status: Not on file  . Intimate partner violence:    Fear of current or ex partner: Not on file    Emotionally abused: Not on file    Physically abused: Not on file    Forced sexual activity: Not on file  Other Topics Concern  . 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 NO PAST SURGERIES     Denies surgical history    Family History  Problem Relation Age of Onset  . Healthy Father   . Congestive Heart Failure Father   . Heart disease Father   .  Diabetes Father   . Other Mother        deceased from multiple myelomas  . Cancer Mother   . Colon cancer Neg Hx   . Esophageal cancer Neg Hx   . Liver cancer Neg Hx   . Pancreatic cancer Neg Hx   . Rectal cancer Neg Hx   . Stomach cancer Neg Hx     No Known Allergies  Current Outpatient Medications on File Prior to Visit  Medication Sig Dispense Refill  . buPROPion (WELLBUTRIN SR) 200 MG 12 hr tablet Take 1 tablet (200 mg total) by mouth daily at 12 noon. 30 tablet 0  . norethindrone (MICRONOR,CAMILA,ERRIN) 0.35 MG tablet Take 1 tablet by mouth daily.  99  . polyethylene glycol powder (GLYCOLAX/MIRALAX) powder Take 17 g by mouth daily. 3350 g 0  . Vitamin D, Ergocalciferol, (DRISDOL) 50000 units CAPS capsule Take 1 capsule (50,000 Units total) by mouth every 7 (seven) days. 4 capsule 0   Current Facility-Administered Medications on File Prior to Visit   Medication Dose Route Frequency Provider Last Rate Last Dose  . 0.9 %  sodium chloride infusion  500 mL Intravenous Once Pyrtle, Lajuan Lines, MD        BP 116/80 (BP Location: Right Arm, Cuff Size: Normal)   Pulse 88   Temp 98.6 F (37 C) (Oral)   Resp 16   Ht 5' 5.5" (1.664 m)   Wt 180 lb 6.4 oz (81.8 kg)   SpO2 100%   BMI 29.56 kg/m    Objective:   Physical Exam  Physical Exam  Constitutional: She is oriented to person, place, and time. She appears well-developed and well-nourished. No distress.  HENT:  Head: Normocephalic and atraumatic.  Right Ear: Tympanic membrane and ear canal normal.  Left Ear: Tympanic membrane and ear canal normal.  Mouth/Throat: Oropharynx is clear and moist.  Eyes: Pupils are equal, round, and reactive to light. No scleral icterus.  Neck: Normal range of motion. No thyromegaly present.  Cardiovascular: Normal rate and regular rhythm.   No murmur heard. Pulmonary/Chest: Effort normal and breath sounds normal. No respiratory distress. He has no wheezes. She has no rales. She exhibits no tenderness.  Abdominal: Soft. Bowel sounds are normal. She exhibits no distension and no mass. There is no tenderness. There is no rebound and no guarding.  Musculoskeletal: She exhibits no edema.  Lymphadenopathy:    She has no cervical adenopathy.  Neurological: She is alert and oriented to person, place, and time. She has normal patellar reflexes. She exhibits normal muscle tone. Coordination normal.  Skin: Skin is warm and dry.  Psychiatric: She has a normal mood and affect. Her behavior is normal. Judgment and thought content normal.  Breasts: Examined lying Right: Without masses, retractions, discharge or axillary adenopathy.  Left: Without masses, retractions, discharge or axillary adenopathy.  Pelvic: deferred           Assessment & Plan:    Preventative care- I commended patient on her weight loss.  Discussed continuing healthy diet, exercise and  weight loss. Labs reviewed and stable. Mammo/colo/pap up to date.      Assessment & Plan:

## 2018-04-04 NOTE — Patient Instructions (Signed)
Preventive Care 40-64 Years, Female Preventive care refers to lifestyle choices and visits with your health care provider that can promote health and wellness. What does preventive care include?  A yearly physical exam. This is also called an annual well check.  Dental exams once or twice a year.  Routine eye exams. Ask your health care provider how often you should have your eyes checked.  Personal lifestyle choices, including: ? Daily care of your teeth and gums. ? Regular physical activity. ? Eating a healthy diet. ? Avoiding tobacco and drug use. ? Limiting alcohol use. ? Practicing safe sex. ? Taking low-dose aspirin daily starting at age 58. ? Taking vitamin and mineral supplements as recommended by your health care provider. What happens during an annual well check? The services and screenings done by your health care provider during your annual well check will depend on your age, overall health, lifestyle risk factors, and family history of disease. Counseling Your health care provider may ask you questions about your:  Alcohol use.  Tobacco use.  Drug use.  Emotional well-being.  Home and relationship well-being.  Sexual activity.  Eating habits.  Work and work Statistician.  Method of birth control.  Menstrual cycle.  Pregnancy history.  Screening You may have the following tests or measurements:  Height, weight, and BMI.  Blood pressure.  Lipid and cholesterol levels. These may be checked every 5 years, or more frequently if you are over 81 years old.  Skin check.  Lung cancer screening. You may have this screening every year starting at age 78 if you have a 30-pack-year history of smoking and currently smoke or have quit within the past 15 years.  Fecal occult blood test (FOBT) of the stool. You may have this test every year starting at age 65.  Flexible sigmoidoscopy or colonoscopy. You may have a sigmoidoscopy every 5 years or a colonoscopy  every 10 years starting at age 30.  Hepatitis C blood test.  Hepatitis B blood test.  Sexually transmitted disease (STD) testing.  Diabetes screening. This is done by checking your blood sugar (glucose) after you have not eaten for a while (fasting). You may have this done every 1-3 years.  Mammogram. This may be done every 1-2 years. Talk to your health care provider about when you should start having regular mammograms. This may depend on whether you have a family history of breast cancer.  BRCA-related cancer screening. This may be done if you have a family history of breast, ovarian, tubal, or peritoneal cancers.  Pelvic exam and Pap test. This may be done every 3 years starting at age 80. Starting at age 36, this may be done every 5 years if you have a Pap test in combination with an HPV test.  Bone density scan. This is done to screen for osteoporosis. You may have this scan if you are at high risk for osteoporosis.  Discuss your test results, treatment options, and if necessary, the need for more tests with your health care provider. Vaccines Your health care provider may recommend certain vaccines, such as:  Influenza vaccine. This is recommended every year.  Tetanus, diphtheria, and acellular pertussis (Tdap, Td) vaccine. You may need a Td booster every 10 years.  Varicella vaccine. You may need this if you have not been vaccinated.  Zoster vaccine. You may need this after age 5.  Measles, mumps, and rubella (MMR) vaccine. You may need at least one dose of MMR if you were born in  1957 or later. You may also need a second dose.  Pneumococcal 13-valent conjugate (PCV13) vaccine. You may need this if you have certain conditions and were not previously vaccinated.  Pneumococcal polysaccharide (PPSV23) vaccine. You may need one or two doses if you smoke cigarettes or if you have certain conditions.  Meningococcal vaccine. You may need this if you have certain  conditions.  Hepatitis A vaccine. You may need this if you have certain conditions or if you travel or work in places where you may be exposed to hepatitis A.  Hepatitis B vaccine. You may need this if you have certain conditions or if you travel or work in places where you may be exposed to hepatitis B.  Haemophilus influenzae type b (Hib) vaccine. You may need this if you have certain conditions.  Talk to your health care provider about which screenings and vaccines you need and how often you need them. This information is not intended to replace advice given to you by your health care provider. Make sure you discuss any questions you have with your health care provider. Document Released: 12/20/2015 Document Revised: 08/12/2016 Document Reviewed: 09/24/2015 Elsevier Interactive Patient Education  2018 Elsevier Inc.  

## 2018-04-18 ENCOUNTER — Ambulatory Visit (INDEPENDENT_AMBULATORY_CARE_PROVIDER_SITE_OTHER): Payer: 59 | Admitting: Family Medicine

## 2018-04-18 ENCOUNTER — Encounter (INDEPENDENT_AMBULATORY_CARE_PROVIDER_SITE_OTHER): Payer: Self-pay

## 2018-04-18 VITALS — BP 118/83 | HR 98 | Temp 98.3°F | Ht 65.0 in | Wt 173.0 lb

## 2018-04-18 DIAGNOSIS — E559 Vitamin D deficiency, unspecified: Secondary | ICD-10-CM

## 2018-04-18 DIAGNOSIS — F3289 Other specified depressive episodes: Secondary | ICD-10-CM | POA: Diagnosis not present

## 2018-04-18 DIAGNOSIS — Z6830 Body mass index (BMI) 30.0-30.9, adult: Secondary | ICD-10-CM

## 2018-04-18 DIAGNOSIS — Z9189 Other specified personal risk factors, not elsewhere classified: Secondary | ICD-10-CM | POA: Diagnosis not present

## 2018-04-18 DIAGNOSIS — E669 Obesity, unspecified: Secondary | ICD-10-CM

## 2018-04-18 MED ORDER — VITAMIN D (ERGOCALCIFEROL) 1.25 MG (50000 UNIT) PO CAPS: 50000.0000 [IU] | ORAL_CAPSULE | ORAL | 0 refills | Status: DC

## 2018-04-18 MED ORDER — BUPROPION HCL ER (SR) 200 MG PO TB12: 200.0000 mg | ORAL_TABLET | Freq: Every day | ORAL | 0 refills | Status: DC

## 2018-04-19 MED FILL — VIT D2 1.25 MG (50,000 UNIT: 1.25 MG | 28 days supply | Qty: 4 | Fill #0

## 2018-04-19 MED FILL — BUPROPION HCL SR 200 MG TAB: 200 | 30 days supply | Qty: 30 | Fill #0

## 2018-04-19 NOTE — Progress Notes (Signed)
Office: [phone]  /  Fax: [phone]   HPI:   Chief Complaint: OBESITY Yvonne [name] is here to discuss her progress with her obesity treatment plan. She is on the  keep a food journal with 300 to 500 calories and 35+ grams of protein  and follow the Category 3 plan and is following her eating plan approximately 90 % of the time. She states she is walking 30 to 45 minutes 6 times per week. Yvonne [name] is doing well with weight loss. She is getting ready to go on vacation to [facility] and has questions about how to eat on vacation to minimize holiday weight gain. Her weight is 173 lb (78.5 kg) today and has had a weight loss of 5 pounds over a period of 3 weeks since her last visit. She has lost 31 lbs since starting treatment with [place].  Vitamin D deficiency Yvonne [name] has a diagnosis of vitamin D deficiency. Yvonne [name] is stable on vit D. Fatigue is improving and she denies nausea, vomiting or muscle weakness.  Depression with emotional eating behaviors Yvonne [name]'s mood is stable on Wellbutrin and she is doing better with emotional eating. Yvonne [name] struggles with emotional eating and using food for comfort to the extent that it is negatively impacting her health. She often snacks when she is not hungry. Yvonne [name] sometimes feels she is out of control and then feels guilty that she made poor food choices. She has been working on behavior modification techniques to help reduce her emotional eating and has been somewhat successful. Her blood pressure is stable and she denies insomnia. She shows no sign of suicidal or homicidal ideations.  At risk for cardiovascular disease Yvonne [name] is at a higher than average risk for cardiovascular disease due to obesity. She currently denies any chest pain.  Depression screen [facility] 2/9 05/17/2017 04/02/2017  Decreased Interest 3 0  Down, Depressed, Hopeless 0 0  PHQ - 2 Score 3 0  Altered sleeping 3 0  Tired, decreased energy 3 0  Change in appetite 0 1  Feeling bad or failure about  yourself  0 0  Trouble concentrating 0 0  Moving slowly or fidgety/restless 0 0  Suicidal thoughts 0 0  PHQ-9 Score 9 1      ALLERGIES: No Known Allergies  MEDICATIONS: Current Outpatient Medications on File Prior to Visit  Medication Sig Dispense Refill  . norethindrone (MICRONOR,CAMILA,ERRIN) 0.35 MG tablet Take 1 tablet by mouth daily.  99  . polyethylene glycol powder (GLYCOLAX/MIRALAX) powder Take 17 g by mouth daily. 3350 g 0   Current Facility-Administered Medications on File Prior to Visit  Medication Dose Route Frequency Provider Last Rate Last Dose  . 0.9 %  sodium chloride infusion  500 mL Intravenous Once Pyrtle, Lajuan Lines, MD        PAST MEDICAL HISTORY: Past Medical History:  Diagnosis Date  . Anemia   . Arthritis   . High blood pressure   . Joint pain   . Kidney stones   . Microscopic hematuria   . Vitamin D deficiency     PAST SURGICAL HISTORY: Past Surgical History:  Procedure Laterality Date  . NO PAST SURGERIES     Denies surgical history    SOCIAL HISTORY: Social History   Tobacco Use  . Smoking status: Never Smoker  . Smokeless tobacco: Never Used  Substance Use Topics  . Alcohol use: No  . Drug use: No    FAMILY HISTORY: Family History  Problem Relation Age of Onset  .  Healthy Father   . Congestive Heart Failure Father   . Heart disease Father   . Diabetes Father   . Other Mother        deceased from multiple myelomas  . Cancer Mother   . Colon cancer Neg Hx   . Esophageal cancer Neg Hx   . Liver cancer Neg Hx   . Pancreatic cancer Neg Hx   . Rectal cancer Neg Hx   . Stomach cancer Neg Hx     ROS: Review of Systems  Constitutional: Positive for malaise/fatigue and weight loss.  Cardiovascular: Negative for chest pain.  Gastrointestinal: Negative for nausea and vomiting.  Musculoskeletal:       Negative for muscle weakness  Psychiatric/Behavioral: Positive for depression. Negative for suicidal ideas. The patient does not  have insomnia.     PHYSICAL EXAM: Blood pressure 118/83, pulse 98, temperature 98.3 F (36.8 C), temperature source Oral, height 5\' 5"  (1.651 m), weight 173 lb (78.5 kg), SpO2 96 %. Body mass index is 28.79 kg/m. Physical Exam  Constitutional: She is oriented to person, place, and time. She appears well-developed and well-nourished.  Cardiovascular: Normal rate.  Pulmonary/Chest: Effort normal.  Musculoskeletal: Normal range of motion.  Neurological: She is oriented to person, place, and time.  Skin: Skin is warm and dry.  Psychiatric: She has a normal mood and affect.  Vitals reviewed.   RECENT LABS AND TESTS: BMET    Component Value Date/Time   NA 137 03/30/2018 0744   NA 142 02/15/2018 0817   K 4.3 03/30/2018 0744   CL 107 03/30/2018 0744   CO2 24 03/30/2018 0744   GLUCOSE 86 03/30/2018 0744   BUN 17 03/30/2018 0744   BUN 11 02/15/2018 0817   CREATININE 0.84 03/30/2018 0744   CALCIUM 9.5 03/30/2018 0744   GFRNONAA 74 02/15/2018 0817   GFRAA 85 02/15/2018 0817   Lab Results  Component Value Date   HGBA1C 5.6 02/15/2018   HGBA1C 5.7 (H) 09/13/2017   HGBA1C 6.2 (H) 05/17/2017   HGBA1C 5.8 10/12/2014   HGBA1C 5.7 09/14/2011   Lab Results  Component Value Date   INSULIN 28.8 (H) 02/15/2018   INSULIN 35.7 (H) 09/13/2017   INSULIN 27.9 (H) 05/17/2017   CBC    Component Value Date/Time   WBC 7.7 03/30/2018 0744   RBC 4.36 03/30/2018 0744   HGB 13.9 03/30/2018 0744   HGB 13.7 05/17/2017 1142   HCT 41.6 03/30/2018 0744   HCT 42.1 05/17/2017 1142   PLT 323.0 03/30/2018 0744   MCV 95.4 03/30/2018 0744   MCV 94 05/17/2017 1142   MCH 30.6 05/17/2017 1142   MCHC 33.5 03/30/2018 0744   RDW 15.0 03/30/2018 0744   RDW 14.2 05/17/2017 1142   LYMPHSABS 3.0 03/30/2018 0744   LYMPHSABS 3.9 (H) 05/17/2017 1142   MONOABS 0.6 03/30/2018 0744   EOSABS 0.1 03/30/2018 0744   EOSABS 0.1 05/17/2017 1142   BASOSABS 0.1 03/30/2018 0744   BASOSABS 0.0 05/17/2017 1142    Iron/TIBC/Ferritin/ %Sat No results found for: IRON, TIBC, FERRITIN, IRONPCTSAT Lipid Panel     Component Value Date/Time   CHOL 199 03/30/2018 0744   CHOL 213 (H) 02/15/2018 0817   TRIG 66.0 03/30/2018 0744   HDL 69.70 03/30/2018 0744   HDL 62 02/15/2018 0817   CHOLHDL 3 03/30/2018 0744   VLDL 13.2 03/30/2018 0744   LDLCALC 116 (H) 03/30/2018 0744   LDLCALC 140 (H) 02/15/2018 0817   Hepatic Function Panel  Component Value Date/Time   PROT 7.1 03/30/2018 0744   PROT 7.4 03/30/2018 0744   PROT 6.9 02/15/2018 0817   ALBUMIN 4.3 03/30/2018 0744   ALBUMIN 4.3 02/15/2018 0817   AST 16 03/30/2018 0744   ALT 14 03/30/2018 0744   ALKPHOS 87 03/30/2018 0744   BILITOT 0.5 03/30/2018 0744   BILITOT 0.3 02/15/2018 0817   BILIDIR 0.1 03/30/2018 0744   IBILI 0.2 09/23/2011 0911      Component Value Date/Time   TSH 1.32 03/30/2018 0744   TSH 0.644 05/17/2017 1142   TSH 1.53 03/23/2017 0705   Results for JENASCIA, BUMPASS (MRN [id number]) as of 04/19/2018 16:41  Ref. Range 02/15/2018 08:17  Vitamin D, 25-Hydroxy Latest Ref Range: 30.0 - 100.0 ng/mL 51.1   ASSESSMENT AND PLAN: Vitamin D deficiency - Plan: Vitamin D, Ergocalciferol, (DRISDOL) 50000 units CAPS capsule  Other depression - with emotional eating - Plan: buPROPion (WELLBUTRIN SR) 200 MG 12 hr tablet  At risk for heart disease  Class 1 obesity with serious comorbidity and body mass index (BMI) of 30.0 to 30.9 in adult, unspecified obesity type - Starting BMI greater then 30  PLAN:  Vitamin D Deficiency Alejandria was informed that low vitamin D levels contributes to fatigue and are associated with obesity, breast, and colon cancer. She agrees to continue to take prescription Vit D @50 ,000 IU every week #4 with no refills and will follow up for routine testing of vitamin D, at least 2-3 times per year. She was informed of the risk of over-replacement of vitamin D and agrees to not increase her dose unless she discusses  this with [place] first. Sharanda agrees to follow up with our clinic in 3 weeks.  Depression with Emotional Eating Behaviors We discussed behavior modification techniques today to help [facility] deal with her emotional eating and depression. She has agreed to continue Wellbutrin SR 200 mg qd #30 with no refills and follow up as directed.  Cardiovascular risk counseling Gavriela was given extended (15 minutes) coronary artery disease prevention counseling today. She is 47 y.o. female and has risk factors for heart disease including obesity. We discussed intensive lifestyle modifications today with an emphasis on specific weight loss instructions and strategies. Pt was also informed of the importance of increasing exercise and decreasing saturated fats to help prevent heart disease.  Obesity Talaysha is currently in the action stage of change. As such, her goal is to continue with weight loss efforts She has agreed to follow the Category 3 plan [place] has been instructed to work up to a goal of 150 minutes of combined cardio and strengthening exercise per week for weight loss and overall health benefits. We discussed the following Behavioral Modification Strategies today: increasing lean protein intake, decreasing simple carbohydrates , work on meal planning and easy cooking plans, celebration eating strategies and holiday eating strategies   Trenna has agreed to follow up with our clinic in 3 weeks. She was informed of the importance of frequent follow up visits to maximize her success with intensive lifestyle modifications for her multiple health conditions.   OBESITY BEHAVIORAL INTERVENTION VISIT  Today's visit was # 21 out of 22.  Starting weight: 204 lbs Starting date: 05/17/17 Today's weight : 173 lbs  Today's date: 04/18/2018 Total lbs lost to date: 76 (Patients must lose 7 lbs in the first 6 months to continue with counseling)   ASK: We discussed the diagnosis of obesity with Aldona Lento  today and [place] agreed to  give [place] permission to discuss obesity behavioral modification therapy today.  ASSESS: Timiko has the diagnosis of obesity and her BMI today is 28.79 Aeriel is in the action stage of change   ADVISE: Kenleigh was educated on the multiple health risks of obesity as well as the benefit of weight loss to improve her health. She was advised of the need for long term treatment and the importance of lifestyle modifications.  AGREE: Multiple dietary modification options and treatment options were discussed and  S[place] agreed to the above obesity treatment plan.  I, Doreene Nest, am acting as transcriptionist for Dennard Nip, MD  I have reviewed the above documentation for accuracy and completeness, and I agree with the above. -Dennard Nip, MD

## 2018-04-20 ENCOUNTER — Encounter (INDEPENDENT_AMBULATORY_CARE_PROVIDER_SITE_OTHER): Payer: Self-pay | Admitting: Family Medicine

## 2018-04-27 DIAGNOSIS — N898 Other specified noninflammatory disorders of vagina: Secondary | ICD-10-CM | POA: Diagnosis not present

## 2018-04-27 DIAGNOSIS — B373 Candidiasis of vulva and vagina: Secondary | ICD-10-CM | POA: Diagnosis not present

## 2018-04-27 DIAGNOSIS — Z6829 Body mass index (BMI) 29.0-29.9, adult: Secondary | ICD-10-CM | POA: Diagnosis not present

## 2018-04-27 DIAGNOSIS — Z01419 Encounter for gynecological examination (general) (routine) without abnormal findings: Secondary | ICD-10-CM | POA: Diagnosis not present

## 2018-04-27 DIAGNOSIS — Z1151 Encounter for screening for human papillomavirus (HPV): Secondary | ICD-10-CM | POA: Diagnosis not present

## 2018-04-27 MED FILL — FLUCONAZOLE 150 MG TABS: 150 | 1 days supply | Qty: 1 | Fill #0

## 2018-04-27 MED FILL — NORETHIND-ETH ESTRAD 1-0.02: 1-20 | 84 days supply | Qty: 84 | Fill #0

## 2018-05-09 ENCOUNTER — Ambulatory Visit (INDEPENDENT_AMBULATORY_CARE_PROVIDER_SITE_OTHER): Payer: 59 | Admitting: Family Medicine

## 2018-05-09 VITALS — BP 112/77 | HR 89 | Temp 98.2°F | Ht 65.0 in | Wt 173.0 lb

## 2018-05-09 DIAGNOSIS — Z6830 Body mass index (BMI) 30.0-30.9, adult: Secondary | ICD-10-CM

## 2018-05-09 DIAGNOSIS — Z9189 Other specified personal risk factors, not elsewhere classified: Secondary | ICD-10-CM | POA: Diagnosis not present

## 2018-05-09 DIAGNOSIS — E559 Vitamin D deficiency, unspecified: Secondary | ICD-10-CM

## 2018-05-09 DIAGNOSIS — F988 Other specified behavioral and emotional disorders with onset usually occurring in childhood and adolescence: Secondary | ICD-10-CM

## 2018-05-09 DIAGNOSIS — E669 Obesity, unspecified: Secondary | ICD-10-CM | POA: Diagnosis not present

## 2018-05-09 MED ORDER — BUPROPION HCL ER (SR) 200 MG PO TB12: 200.0000 mg | ORAL_TABLET | Freq: Every day | ORAL | 0 refills | Status: DC

## 2018-05-09 MED ORDER — VITAMIN D (ERGOCALCIFEROL) 1.25 MG (50000 UNIT) PO CAPS: 50000.0000 [IU] | ORAL_CAPSULE | ORAL | 0 refills | Status: DC

## 2018-05-10 NOTE — Progress Notes (Signed)
Office: [phone]  /  Fax: [phone]   HPI:   Chief Complaint: OBESITY Yvonne [name] is here to discuss her progress with her obesity treatment plan. She is on the Category 3 plan and is following her eating plan approximately 90 % of the time. She states she is walking for 45 minutes 5 times per week. Yvonne [name] did well maintaining weight while on vacation. She walked approximately 20,000 steps daily while in [employer] and had some increase simple carbohydrates but tried to portion control and make smarter food choices.  Her weight is 173 lb (78.5 kg) today and has not lost weight since her last visit. She has lost 31 lbs since starting treatment with [place].  Attention Deficit Disorder Yvonne [name] is stable on Wellbutrin, she suspects she has had attention deficit disorder since childhood and feels she is doing better on Wellbutrin.  Vitamin D Deficiency Yvonne [name] has a diagnosis of vitamin D deficiency. She is stable on prescription Vit D and denies nausea, vomiting or muscle weakness.  At risk for osteopenia and osteoporosis Yvonne [name] is at higher risk of osteopenia and osteoporosis due to vitamin D deficiency.   ALLERGIES: No Known Allergies  MEDICATIONS: Current Outpatient Medications on File Prior to Visit  Medication Sig Dispense Refill  . norethindrone-ethinyl estradiol (JUNEL 1/20) 1-20 MG-MCG tablet Take 1 tablet by mouth daily.    . polyethylene glycol powder (GLYCOLAX/MIRALAX) powder Take 17 g by mouth daily. 3350 g 0   Current Facility-Administered Medications on File Prior to Visit  Medication Dose Route Frequency Provider Last Rate Last Dose  . 0.9 %  sodium chloride infusion  500 mL Intravenous Once Pyrtle, Lajuan Lines, MD        PAST MEDICAL HISTORY: Past Medical History:  Diagnosis Date  . Anemia   . Arthritis   . High blood pressure   . Joint pain   . Kidney stones   . Microscopic hematuria   . Vitamin D deficiency     PAST SURGICAL HISTORY: Past Surgical History:    Procedure Laterality Date  . NO PAST SURGERIES     Denies surgical history    SOCIAL HISTORY: Social History   Tobacco Use  . Smoking status: Never Smoker  . Smokeless tobacco: Never Used  Substance Use Topics  . Alcohol use: No  . Drug use: No    FAMILY HISTORY: Family History  Problem Relation Age of Onset  . Healthy Father   . Congestive Heart Failure Father   . Heart disease Father   . Diabetes Father   . Other Mother        deceased from multiple myelomas  . Cancer Mother   . Colon cancer Neg Hx   . Esophageal cancer Neg Hx   . Liver cancer Neg Hx   . Pancreatic cancer Neg Hx   . Rectal cancer Neg Hx   . Stomach cancer Neg Hx     ROS: Review of Systems  Constitutional: Negative for weight loss.  Gastrointestinal: Negative for nausea and vomiting.  Musculoskeletal:       Negative muscle weakness    PHYSICAL EXAM: Blood pressure 112/77, pulse 89, temperature 98.2 F (36.8 C), temperature source Oral, height 5\' 5"  (1.651 m), weight 173 lb (78.5 kg), SpO2 98 %. Body mass index is 28.79 kg/m. Physical Exam  Constitutional: She is oriented to person, place, and time. She appears well-developed and well-nourished.  Cardiovascular: Normal rate.  Pulmonary/Chest: Effort normal.  Musculoskeletal: Normal range of motion.  Neurological:  She is oriented to person, place, and time.  Skin: Skin is warm and dry.  Psychiatric: She has a normal mood and affect. Her behavior is normal.  Vitals reviewed.   RECENT LABS AND TESTS: BMET    Component Value Date/Time   NA 137 03/30/2018 0744   NA 142 02/15/2018 0817   K 4.3 03/30/2018 0744   CL 107 03/30/2018 0744   CO2 24 03/30/2018 0744   GLUCOSE 86 03/30/2018 0744   BUN 17 03/30/2018 0744   BUN 11 02/15/2018 0817   CREATININE 0.84 03/30/2018 0744   CALCIUM 9.5 03/30/2018 0744   GFRNONAA 74 02/15/2018 0817   GFRAA 85 02/15/2018 0817   Lab Results  Component Value Date   HGBA1C 5.6 02/15/2018   HGBA1C 5.7  (H) 09/13/2017   HGBA1C 6.2 (H) 05/17/2017   HGBA1C 5.8 10/12/2014   HGBA1C 5.7 09/14/2011   Lab Results  Component Value Date   INSULIN 28.8 (H) 02/15/2018   INSULIN 35.7 (H) 09/13/2017   INSULIN 27.9 (H) 05/17/2017   CBC    Component Value Date/Time   WBC 7.7 03/30/2018 0744   RBC 4.36 03/30/2018 0744   HGB 13.9 03/30/2018 0744   HGB 13.7 05/17/2017 1142   HCT 41.6 03/30/2018 0744   HCT 42.1 05/17/2017 1142   PLT 323.0 03/30/2018 0744   MCV 95.4 03/30/2018 0744   MCV 94 05/17/2017 1142   MCH 30.6 05/17/2017 1142   MCHC 33.5 03/30/2018 0744   RDW 15.0 03/30/2018 0744   RDW 14.2 05/17/2017 1142   LYMPHSABS 3.0 03/30/2018 0744   LYMPHSABS 3.9 (H) 05/17/2017 1142   MONOABS 0.6 03/30/2018 0744   EOSABS 0.1 03/30/2018 0744   EOSABS 0.1 05/17/2017 1142   BASOSABS 0.1 03/30/2018 0744   BASOSABS 0.0 05/17/2017 1142   Iron/TIBC/Ferritin/ %Sat No results found for: IRON, TIBC, FERRITIN, IRONPCTSAT Lipid Panel     Component Value Date/Time   CHOL 199 03/30/2018 0744   CHOL 213 (H) 02/15/2018 0817   TRIG 66.0 03/30/2018 0744   HDL 69.70 03/30/2018 0744   HDL 62 02/15/2018 0817   CHOLHDL 3 03/30/2018 0744   VLDL 13.2 03/30/2018 0744   LDLCALC 116 (H) 03/30/2018 0744   LDLCALC 140 (H) 02/15/2018 0817   Hepatic Function Panel     Component Value Date/Time   PROT 7.1 03/30/2018 0744   PROT 7.4 03/30/2018 0744   PROT 6.9 02/15/2018 0817   ALBUMIN 4.3 03/30/2018 0744   ALBUMIN 4.3 02/15/2018 0817   AST 16 03/30/2018 0744   ALT 14 03/30/2018 0744   ALKPHOS 87 03/30/2018 0744   BILITOT 0.5 03/30/2018 0744   BILITOT 0.3 02/15/2018 0817   BILIDIR 0.1 03/30/2018 0744   IBILI 0.2 09/23/2011 0911      Component Value Date/Time   TSH 1.32 03/30/2018 0744   TSH 0.644 05/17/2017 1142   TSH 1.53 03/23/2017 0705  Results for ABERDEEN, HAFEN D (MRN [id number]) as of 05/10/2018 08:02  Ref. Range 02/15/2018 08:17  Vitamin D, 25-Hydroxy Latest Ref Range: 30.0 - 100.0 ng/mL 51.1     ASSESSMENT AND PLAN: At risk for osteoporosis  Attention deficit disorder, unspecified hyperactivity presence - Plan: buPROPion (WELLBUTRIN SR) 200 MG 12 hr tablet  Vitamin D deficiency - Plan: Vitamin D, Ergocalciferol, (DRISDOL) 50000 units CAPS capsule  Class 1 obesity with serious comorbidity and body mass index (BMI) of 30.0 to 30.9 in adult, unspecified obesity type - starting BMI >30  PLAN:  Attention Deficit Disorder We will  refer to Dr. Mallie Mussel for evaluation. Sadiyah agrees to continue taking Wellbutrin SR 200 mg qd #30 and we will refill for 1 month. Janifer agrees to follow up with our clinic in 2 to 3 weeks.   Vitamin D Deficiency Paisli was informed that low vitamin D levels contributes to fatigue and are associated with obesity, breast, and colon cancer. Charnele agrees to continue taking prescription Vit D @50 ,000 IU every week #4 and we will refill for 1 month. She will follow up for routine testing of vitamin D, at least 2-3 times per year. She was informed of the risk of over-replacement of vitamin D and agrees to not increase her dose unless she discusses this with [place] first. Malka agrees to follow up with our clinic in 2 to 3 weeks.  At risk for osteopenia and osteoporosis Malayzia is at risk for osteopenia and osteoporsis due to her vitamin D deficiency. She was encouraged to take her vitamin D and follow her higher calcium diet and increase strengthening exercise to help strengthen her bones and decrease her risk of osteopenia and osteoporosis.  Obesity Carmaleta is currently in the action stage of change. As such, her goal is to continue with weight loss efforts She has agreed to follow the Category 3 plan Lateefah has been instructed to work up to a goal of 150 minutes of combined cardio and strengthening exercise per week for weight loss and overall health benefits. We discussed the following Behavioral Modification Strategies today: increasing lean protein intake,  decreasing simple carbohydrates  and work on meal planning and easy cooking plans   Crystalmarie has agreed to follow up with our clinic in 2 to 3 weeks. She was informed of the importance of frequent follow up visits to maximize her success with intensive lifestyle modifications for her multiple health conditions.   OBESITY BEHAVIORAL INTERVENTION VISIT  Today's visit was # 22 out of 22.  Starting weight: 204 lbs Starting date: 05/17/17 Today's weight : 173 lbs Today's date: 05/09/2018 Total lbs lost to date: 55 (Patients must lose 7 lbs in the first 6 months to continue with counseling)   ASK: We discussed the diagnosis of obesity with Aldona Lento today and Leilanie agreed to give [place] permission to discuss obesity behavioral modification therapy today.  ASSESS: Lynnsie has the diagnosis of obesity and her BMI today is 28.79 Kashira is in the action stage of change   ADVISE: Tyrese was educated on the multiple health risks of obesity as well as the benefit of weight loss to improve her health. She was advised of the need for long term treatment and the importance of lifestyle modifications.  AGREE: Multiple dietary modification options and treatment options were discussed and  Fenix agreed to the above obesity treatment plan.  I, Yvonne [name], am acting as transcriptionist for Dennard Nip, MD  I have reviewed the above documentation for accuracy and completeness, and I agree with the above. -Dennard Nip, MD

## 2018-05-24 DIAGNOSIS — H524 Presbyopia: Secondary | ICD-10-CM | POA: Diagnosis not present

## 2018-05-24 LAB — HM DIABETES EYE EXAM

## 2018-05-26 MED FILL — VIT D2 1.25 MG (50,000 UNIT: 1.25 MG | 28 days supply | Qty: 4 | Fill #0

## 2018-05-26 MED FILL — BUPROPION HCL SR 200 MG TAB: 200 | 30 days supply | Qty: 30 | Fill #0

## 2018-06-01 NOTE — Progress Notes (Addendum)
Office: [phone]  /  Fax: [phone]   Date: June 06, 2018 Time Seen: 4:00pm  Duration: 55 minutes Provider: Glennie Isle, PsyD Type of Session: Intake for Individual Therapy   Informed Consent:The provider's role was explained to Aldona Lento. The provider discussed issues of confidentiality, privacy, and limits therein; anticipated course of treatment; potential risks involved with psychotherapy; voluntary nature of treatment; and the clinic's cancellation policy. In addition to written consent, verbal informed consent for psychological services was also obtained from [facility] prior to initial intake interview.   Marasia was informed that information about mental health appointments will be entered in the medical record at [facility]) via [place]. Kimyatta agreed information may be shared with other [facility] providers as needed for coordination of care. Written consent was also provided for this provider to coordinate care with other providers [facility].The provider also explained leaving voicemail messages and MyChart can be utilized for non-emergency reasons and limits of confidentiality related to communication via technology was discussed. Zanetta was also informed the clinic is not a 24/7 crisis center and mental health emergency resources were shared. A handout with emergency resources was also provided. Chairty verbally acknowledged understanding and also agreed to use mental health emergency resources discussed if needed.   Chief Complaint: Aleathia was referred by Dr. Dennard Nip due to Attention Deficit Disorder and Depression with emotional eating behaviors. Per the note for the office visit with Dr. Leafy Ro on May 09, 2018, "Missey is stable on Wellbutrin, she suspects she has had attention deficit disorder since childhood and feels she is doing better on Wellbutrin." Per the note for the office visit with Dr. Leafy Ro on  Apr 18, 2018, "Tuana's mood is stable on Wellbutrin and she is doing better with emotional eating. Mone struggles with emotional eating and using food for comfort to the extent that it is negatively impacting her health. She often snacks when she is not hungry. Arcola sometimes feels she is out of control and then feels guilty that she made poor food choices. She has been working on behavior modification techniques to help reduce her emotional eating and has been somewhat successful. Her blood pressure is stable and she denies insomnia. She shows no sign of suicidal or homicidal ideations."  Delonda reported concern related to Attention Deficit Disorder (ADD), which she shared with Dr. Leafy Ro. Lucendia noted both her children were diagnosed with Attention Deficit/Hyperactivity Disorder (AD/HD). She indicated she does not feel she experiences hyperactivity; however, she believes she is experiencing attention issues. Per [place], she is not as focused as she used to be. Julane noted, "I don't see myself being depressed. I am very upbeat." She noted the change in her memory starting approximately six months ago and it has continued to get worse. Langston noted she no longer engages in emotional eating since she started with the clinic.   HPI: Per the note for the initial visit with Dr. Leafy Ro on May 17, 2017, [place] started gaining weight after last pregnancy and her heaviest weight ever was 210 pounds.   Mental Status Examination: Esme arrived on time for the appointment. She presented as appropriately dressed and groomed. Jadynn appeared her stated age and demonstrated adequate orientation to time, place, person, and purpose of the appointment. She also demonstrated appropriate eye contact. No psychomotor abnormalities or behavioral peculiarities noted. Her mood was euthymic with congruent affect. Her thought processes were logical, linear, and goal-directed. No hallucinations, delusions, bizarre  thinking or  behavior reported or observed. Judgment, insight, and impulse control appeared to be grossly intact. There was no evidence of paraphasias (i.e., errors in speech, gross mispronunciations, and word substitutions), repetition deficits, or disturbances in volume or prosody (i.e., rhythm and intonation). There was no evidence of attention or memory impairments. Jadon denied current suicidal and homicidal ideation, plan, and intent.   The Mini-Mental State Examination, Second Edition (MMSE-2) was administered. The MMSE-2 briefly screens for cognitive dysfunction and overall mental status and assesses different cognitive domains: orientation, registration, attention and calculation, recall, and language and praxis. Elanna received 28 out of 30 points possible on the MMSE-2. Two points were lost on an attention and calculation task.   Family & Psychosocial History: Sheneka shared she was an only child. She noted her mother is deceased and her father has "major health issues." She explained he lives out of state and they communicate a lot. Kinley reported she has been married for 17 years and has three children (ages 81, 94, and 65). 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 Her current social support system consists of her husband.   Medical History: Per Brayton Layman, her medical history is significant for Vitamin D deficiency. She noted she currently takes Wellbutrin for cravings. Indria denied a history of hospitalizations and surgeries. She also denied a history of head injuries and loss of consciousness.   Mental Health History: Georgiana denied a history of therapeutic services and hospitalizations for psychiatric reasons. She also has never seen a psychiatrist. Aside from her two youngest children being diagnosed with AD/HD she denied other family psychiatric history.  Daysha denied a history of trauma, including sexual, physical, and emotional abuse, as well as neglect. Tyliyah currently experiences difficulty falling asleep and she attributed it to worry thoughts related to things she needs to do. She noted she also feels tired and attributed it to having to remember everything. Fermina stated she also experiences anxiety about her memory. For example, she feels anxious when talking to someone due to worry she will forget what she wants to say. She also experiences worry about whether or not she forgot to do something. Jaeda indicated in meetings that she is not interested in, she fidgets. She denied getting up and walking away. If Mariachristina is focused on something, she reported experiencing irritability when she is distracted. Mckenley stated she started writing things down, uses a calendar, and puts reminders in her phone. She noted the aforementioned has made things easier, but she has to remember to enter information. Raia denied the following: obsessions and compulsions; mania; substance use; engagement in self-harm; hallucinations and delusions; and history of and current suicidal and homicidal ideation, plan, and intent.   Structured Assessment Results: The Patient Health Questionnaire-9 (PHQ-9) is a self-report measure that assesses symptoms and severity of depression over the course of the last two weeks. Shatima obtained a score of five suggesting mild depression.  Tian finds the aforementioned symptoms to be very difficult.  Depression screen PHQ 2/9 06/06/2018  Decreased Interest 0  Down, Depressed, Hopeless 0  PHQ - 2 Score 0  Altered sleeping 1  Tired, decreased energy 1  Change in appetite 0  Feeling bad or failure about yourself  0  Trouble concentrating 3  Moving slowly or fidgety/restless 0  Suicidal thoughts 0  PHQ-9 Score 5   The Generalized Anxiety Disorder-7 (  GAD-7) is a brief self-report measure that assesses symptoms of anxiety over the course  of the last two weeks. Charlee obtained a score of seven suggesting mild anxiety.   GAD 7 : Generalized Anxiety Score 06/06/2018  Nervous, Anxious, on Edge 1  Control/stop worrying 2  Worry too much - different things 1  Trouble relaxing 0  Restless 2  Easily annoyed or irritable 1  Afraid - awful might happen 0  Total GAD 7 Score 7  Anxiety Difficulty Very difficult   The Adult ADHD Self-Report Scale (ASRS) is an 18-item self-report measure that assesses symptoms and symptom frequency of Attention-Deficit/Hyperactivity Disorder (AD/HD) over the course of the past six months. Kuuipo endorsed all items noted on the ASRS. The following were noted in the grey area as occurring often: difficulty getting things in order when she has to do a task that requires organization; problems remembering appointments or obligations; fidgets or squirms; difficulty keeping her attention when she is doing boring or repetitive work; difficulty concentrating on what people say to her even when they are speaking directly to her; being distracted by activity or noise around her; feeling restless or fidgety; finding herself talking too much in social situations; finishing others' sentences before they can finish; and interrupting others when they are busy.   Interventions: A chart review was conducted prior to the clinical intake interview. The MMSE-2, PHQ-9, GAD-7, and ASRS were administered and a clinical intake interview was completed. Throughout session, empathic reflections and validation was provided. At this time, Zamaria expressed interest in determining whether she meets criteria for AD/HD; therefore, it was noted continuing treatment with this provider would not be clinically indicated. Kyley was understanding. She was provided with psychoeducation regarding the similarities between symptoms of AD/HD and anxiety as she is currently experiences stressors and anxiety symptoms. The provider also discussed results of the  MMSE-2 and the ASRS. It was explained Shae could seek an evaluation for a diagnosis of AD/HD. With Marguerite's consent, the provider gave Dr. Leafy Ro an update in order for her to further discuss options with [facility] at their appointment today.  Provisional DSM-5 Diagnosis: 300.09 (F41.8) Other Specified Anxiety Disorder, Generalized anxiety not occurring more days than not  Plan: At this time, a follow-up appointment with this provider was not scheduled as the purpose of today's appointment was to explore the possibility of an AD/HD diagnosis. Pam inquired about her ability to re-initiate services with this provider should she began experiencing issues or concerns that impact her ability to continue with her eating plan and success. She was informed she would be able to re-initiate services.

## 2018-06-03 ENCOUNTER — Encounter: Payer: Self-pay | Admitting: Family

## 2018-06-06 ENCOUNTER — Ambulatory Visit (INDEPENDENT_AMBULATORY_CARE_PROVIDER_SITE_OTHER): Payer: 59 | Admitting: Family Medicine

## 2018-06-06 ENCOUNTER — Ambulatory Visit (INDEPENDENT_AMBULATORY_CARE_PROVIDER_SITE_OTHER): Payer: 59 | Admitting: Psychology

## 2018-06-06 VITALS — BP 120/85 | HR 82 | Temp 98.5°F | Ht 65.0 in | Wt 172.0 lb

## 2018-06-06 DIAGNOSIS — E559 Vitamin D deficiency, unspecified: Secondary | ICD-10-CM

## 2018-06-06 DIAGNOSIS — Z6830 Body mass index (BMI) 30.0-30.9, adult: Secondary | ICD-10-CM | POA: Diagnosis not present

## 2018-06-06 DIAGNOSIS — E669 Obesity, unspecified: Secondary | ICD-10-CM

## 2018-06-06 DIAGNOSIS — F418 Other specified anxiety disorders: Secondary | ICD-10-CM

## 2018-06-06 MED ORDER — ESCITALOPRAM OXALATE 10 MG PO TABS: 10.0000 mg | ORAL_TABLET | Freq: Every day | ORAL | 0 refills | Status: DC

## 2018-06-07 ENCOUNTER — Ambulatory Visit (INDEPENDENT_AMBULATORY_CARE_PROVIDER_SITE_OTHER): Payer: 59 | Admitting: Family Medicine

## 2018-06-07 MED FILL — ESCITALOPRAM 10 MG TABLET: 10 | 30 days supply | Qty: 30 | Fill #0

## 2018-06-08 NOTE — Progress Notes (Signed)
Office: [phone]  /  Fax: [phone]   HPI:   Chief Complaint: OBESITY Yvonne [name] is here to discuss her progress with her obesity treatment plan. She is on the Category 3 plan and is following her eating plan approximately 80 % of the time. She states she is walking for 30-45 minutes 5 times per week. Yvonne [name] continues to work on diet and exercise and is doing well with her weight loss efforts. She has increase walking and hunger is controlled. She notes an increase in emotional eating.  Her weight is 172 lb (78 kg) today and has had a weight loss of 1 pounds over a period of 4 weeks since her last visit. She has lost 32 lbs since starting treatment with [place].  Depression with Anxiety Yvonne [name] has been feeling low in mood and increased irritability both at home and at work. Testing done by Dr. Mallie Mussel shows she is likely struggling with anxiety. She feels she has increased feeling forgetful and not as organized. [name] struggles with emotional eating and using food for comfort to the extent that it is negatively impacting her health. She often snacks when she is not hungry. Yvonne [name] sometimes feels she is out of control and then feels guilty that she made poor food choices. She has been working on behavior modification techniques to help reduce her emotional eating and has been somewhat successful. She shows no sign of suicidal or homicidal ideations.  Depression screen [facility] 2/9 06/06/2018 05/17/2017 04/02/2017  Decreased Interest 0 3 0  Down, Depressed, Hopeless 0 0 0  PHQ - 2 Score 0 3 0  Altered sleeping 1 3 0  Tired, decreased energy 1 3 0  Change in appetite 0 0 1  Feeling bad or failure about yourself  0 0 0  Trouble concentrating 3 0 0  Moving slowly or fidgety/restless 0 0 0  Suicidal thoughts 0 0 0  PHQ-9 Score 5 9 1     ALLERGIES: No Known Allergies  MEDICATIONS: Current Outpatient Medications on File Prior to Visit  Medication Sig Dispense Refill  . buPROPion (WELLBUTRIN SR) 200  MG 12 hr tablet Take 1 tablet (200 mg total) by mouth daily at 12 noon. 30 tablet 0  . norethindrone-ethinyl estradiol (JUNEL 1/20) 1-20 MG-MCG tablet Take 1 tablet by mouth daily.    . polyethylene glycol powder (GLYCOLAX/MIRALAX) powder Take 17 g by mouth daily. 3350 g 0  . Vitamin D, Ergocalciferol, (DRISDOL) 50000 units CAPS capsule Take 1 capsule (50,000 Units total) by mouth every 7 (seven) days. 4 capsule 0   Current Facility-Administered Medications on File Prior to Visit  Medication Dose Route Frequency Provider Last Rate Last Dose  . 0.9 %  sodium chloride infusion  500 mL Intravenous Once Pyrtle, Lajuan Lines, MD        PAST MEDICAL HISTORY: Past Medical History:  Diagnosis Date  . Anemia   . Arthritis   . High blood pressure   . Joint pain   . Kidney stones   . Microscopic hematuria   . Vitamin D deficiency     PAST SURGICAL HISTORY: Past Surgical History:  Procedure Laterality Date  . NO PAST SURGERIES     Denies surgical history    SOCIAL HISTORY: Social History   Tobacco Use  . Smoking status: Never Smoker  . Smokeless tobacco: Never Used  Substance Use Topics  . Alcohol use: No  . Drug use: No    FAMILY HISTORY: Family History  Problem Relation Age of  Onset  . Healthy Father   . Congestive Heart Failure Father   . Heart disease Father   . Diabetes Father   . Other Mother        deceased from multiple myelomas  . Cancer Mother   . Colon cancer Neg Hx   . Esophageal cancer Neg Hx   . Liver cancer Neg Hx   . Pancreatic cancer Neg Hx   . Rectal cancer Neg Hx   . Stomach cancer Neg Hx     ROS: Review of Systems  Constitutional: Positive for malaise/fatigue.  Psychiatric/Behavioral: Positive for depression. Negative for suicidal ideas.       + Anxiety    PHYSICAL EXAM: Blood pressure 120/85, pulse 82, temperature 98.5 F (36.9 C), temperature source Oral, height 5\' 5"  (1.651 m), weight 172 lb (78 kg), SpO2 100 %. Body mass index is 28.62  kg/m. Physical Exam  Constitutional: She is oriented to person, place, and time. She appears well-developed and well-nourished.  Cardiovascular: Normal rate.  Pulmonary/Chest: Effort normal.  Musculoskeletal: Normal range of motion.  Neurological: She is oriented to person, place, and time.  Skin: Skin is warm and dry.  Psychiatric: She has a normal mood and affect. Her behavior is normal.  Vitals reviewed.   RECENT LABS AND TESTS: BMET    Component Value Date/Time   NA 137 03/30/2018 0744   NA 142 02/15/2018 0817   K 4.3 03/30/2018 0744   CL 107 03/30/2018 0744   CO2 24 03/30/2018 0744   GLUCOSE 86 03/30/2018 0744   BUN 17 03/30/2018 0744   BUN 11 02/15/2018 0817   CREATININE 0.84 03/30/2018 0744   CALCIUM 9.5 03/30/2018 0744   GFRNONAA 74 02/15/2018 0817   GFRAA 85 02/15/2018 0817   Lab Results  Component Value Date   HGBA1C 5.6 02/15/2018   HGBA1C 5.7 (H) 09/13/2017   HGBA1C 6.2 (H) 05/17/2017   HGBA1C 5.8 10/12/2014   HGBA1C 5.7 09/14/2011   Lab Results  Component Value Date   INSULIN 28.8 (H) 02/15/2018   INSULIN 35.7 (H) 09/13/2017   INSULIN 27.9 (H) 05/17/2017   CBC    Component Value Date/Time   WBC 7.7 03/30/2018 0744   RBC 4.36 03/30/2018 0744   HGB 13.9 03/30/2018 0744   HGB 13.7 05/17/2017 1142   HCT 41.6 03/30/2018 0744   HCT 42.1 05/17/2017 1142   PLT 323.0 03/30/2018 0744   MCV 95.4 03/30/2018 0744   MCV 94 05/17/2017 1142   MCH 30.6 05/17/2017 1142   MCHC 33.5 03/30/2018 0744   RDW 15.0 03/30/2018 0744   RDW 14.2 05/17/2017 1142   LYMPHSABS 3.0 03/30/2018 0744   LYMPHSABS 3.9 (H) 05/17/2017 1142   MONOABS 0.6 03/30/2018 0744   EOSABS 0.1 03/30/2018 0744   EOSABS 0.1 05/17/2017 1142   BASOSABS 0.1 03/30/2018 0744   BASOSABS 0.0 05/17/2017 1142   Iron/TIBC/Ferritin/ %Sat No results found for: IRON, TIBC, FERRITIN, IRONPCTSAT Lipid Panel     Component Value Date/Time   CHOL 199 03/30/2018 0744   CHOL 213 (H) 02/15/2018 0817    TRIG 66.0 03/30/2018 0744   HDL 69.70 03/30/2018 0744   HDL 62 02/15/2018 0817   CHOLHDL 3 03/30/2018 0744   VLDL 13.2 03/30/2018 0744   LDLCALC 116 (H) 03/30/2018 0744   LDLCALC 140 (H) 02/15/2018 0817   Hepatic Function Panel     Component Value Date/Time   PROT 7.1 03/30/2018 0744   PROT 7.4 03/30/2018 0744   PROT 6.9  02/15/2018 0817   ALBUMIN 4.3 03/30/2018 0744   ALBUMIN 4.3 02/15/2018 0817   AST 16 03/30/2018 0744   ALT 14 03/30/2018 0744   ALKPHOS 87 03/30/2018 0744   BILITOT 0.5 03/30/2018 0744   BILITOT 0.3 02/15/2018 0817   BILIDIR 0.1 03/30/2018 0744   IBILI 0.2 09/23/2011 0911      Component Value Date/Time   TSH 1.32 03/30/2018 0744   TSH 0.644 05/17/2017 1142   TSH 1.53 03/23/2017 0705    ASSESSMENT AND PLAN: Depression with anxiety - Plan: escitalopram (LEXAPRO) 10 MG tablet  Class 1 obesity with serious comorbidity and body mass index (BMI) of 30.0 to 30.9 in adult, unspecified obesity type - beginning BMI >30  PLAN:  Depression with Anxiety We discussed behavior modification techniques today to help Chandi deal with her emotional eating, anxiety, and depression. Syrina agrees to start Lexapro 10 mg q daily #30 with no refills. Mckaylin agrees to follow up with our clinic in 3 weeks.  We spent > than 50% of the 30 minute visit on the counseling as documented in the note.  Obesity Damiana is currently in the action stage of change. As such, her goal is to continue with weight loss efforts She has agreed to follow the Category 3 plan [place] has been instructed to work up to a goal of 150 minutes of combined cardio and strengthening exercise per week for weight loss and overall health benefits. We discussed the following Behavioral Modification Strategies today: increasing lean protein intake, decreasing simple carbohydrates  and emotional eating strategies   Jasiel has agreed to follow up with our clinic in 3 weeks. She was informed of the importance of  frequent follow up visits to maximize her success with intensive lifestyle modifications for her multiple health conditions.   I, Trixie Dredge, am acting as transcriptionist for Dennard Nip, MD  I have reviewed the above documentation for accuracy and completeness, and I agree with the above. -Dennard Nip, MD

## 2018-07-04 ENCOUNTER — Ambulatory Visit (INDEPENDENT_AMBULATORY_CARE_PROVIDER_SITE_OTHER): Payer: 59 | Admitting: Family Medicine

## 2018-07-04 VITALS — BP 119/86 | HR 78 | Temp 98.2°F | Ht 65.0 in | Wt 173.0 lb

## 2018-07-04 DIAGNOSIS — E669 Obesity, unspecified: Secondary | ICD-10-CM | POA: Diagnosis not present

## 2018-07-04 DIAGNOSIS — Z6830 Body mass index (BMI) 30.0-30.9, adult: Secondary | ICD-10-CM | POA: Diagnosis not present

## 2018-07-04 DIAGNOSIS — E559 Vitamin D deficiency, unspecified: Secondary | ICD-10-CM

## 2018-07-04 DIAGNOSIS — F988 Other specified behavioral and emotional disorders with onset usually occurring in childhood and adolescence: Secondary | ICD-10-CM

## 2018-07-04 DIAGNOSIS — F418 Other specified anxiety disorders: Secondary | ICD-10-CM

## 2018-07-04 MED ORDER — ESCITALOPRAM OXALATE 10 MG PO TABS: 10.0000 mg | ORAL_TABLET | Freq: Every day | ORAL | 0 refills | Status: DC

## 2018-07-04 MED ORDER — VITAMIN D (ERGOCALCIFEROL) 1.25 MG (50000 UNIT) PO CAPS: 50000.0000 [IU] | ORAL_CAPSULE | ORAL | 0 refills | Status: DC

## 2018-07-04 MED ORDER — BUPROPION HCL ER (SR) 200 MG PO TB12: 200.0000 mg | ORAL_TABLET | Freq: Every day | ORAL | 0 refills | Status: DC

## 2018-07-05 NOTE — Progress Notes (Signed)
Office: [phone]  /  Fax: [phone]   HPI:   Chief Complaint: OBESITY Yvonne [name] is here to discuss her progress with her obesity treatment plan. She is on the Category 3 plan and is following her eating plan approximately 80 % of the time. She states she is walking 30 to 45 minutes 5 times per week. Yvonne [name] is retaining fluid. She has struggled with the category 3 plan. Yvonne [name] is getting bored with the category 3 plan and would like more options. Her weight is 173 lb (78.5 kg) today and has had a weight gain of 1 pound over a period of 4 weeks since her last visit. She has lost 31 lbs since starting treatment with [place].  Vitamin D deficiency Yvonne [name] has a diagnosis of vitamin D deficiency. She is stable on vit D and denies nausea, vomiting or muscle weakness.  Depression with Anxiety Yvonne [name] was started on Lexapro and she struggled to figure out the right time to take it. She feels best with Wellbutrin in the morning and Lexapro in the evening. She struggles with emotional eating and using food for comfort to the extent that it is negatively impacting her health. She often snacks when she is not hungry. Yvonne [name] sometimes feels she is out of control and then feels guilty that she made poor food choices. She has been working on behavior modification techniques to help reduce her emotional eating and has been somewhat successful. She shows no sign of suicidal or homicidal ideations.  Depression screen Yvonne [name] [name] 2/9 06/06/2018 05/17/2017 04/02/2017  Decreased Interest 0 3 0  Down, Depressed, Hopeless 0 0 0  PHQ - 2 Score 0 3 0  Altered sleeping 1 3 0  Tired, decreased energy 1 3 0  Change in appetite 0 0 1  Feeling bad or failure about yourself  0 0 0  Trouble concentrating 3 0 0  Moving slowly or fidgety/restless 0 0 0  Suicidal thoughts 0 0 0  PHQ-9 Score 5 9 1      ALLERGIES: No Known Allergies  MEDICATIONS: Current Outpatient Medications on File Prior to Visit  Medication Sig Dispense  Refill  . norethindrone-ethinyl estradiol (JUNEL 1/20) 1-20 MG-MCG tablet Take 1 tablet by mouth daily.    . polyethylene glycol powder (GLYCOLAX/MIRALAX) powder Take 17 g by mouth daily. 3350 g 0   Current Facility-Administered Medications on File Prior to Visit  Medication Dose Route Frequency Provider Last Rate Last Dose  . 0.9 %  sodium chloride infusion  500 mL Intravenous Once Pyrtle, Lajuan Lines, MD        PAST MEDICAL HISTORY: Past Medical History:  Diagnosis Date  . Anemia   . Arthritis   . High blood pressure   . Joint pain   . Kidney stones   . Microscopic hematuria   . Vitamin D deficiency     PAST SURGICAL HISTORY: Past Surgical History:  Procedure Laterality Date  . NO PAST SURGERIES     Denies surgical history    SOCIAL HISTORY: Social History   Tobacco Use  . Smoking status: Never Smoker  . Smokeless tobacco: Never Used  Substance Use Topics  . Alcohol use: No  . Drug use: No    FAMILY HISTORY: Family History  Problem Relation Age of Onset  . Healthy Father   . Congestive Heart Failure Father   . Heart disease Father   . Diabetes Father   . Other Mother        deceased from multiple myelomas  .  Cancer Mother   . Colon cancer Neg Hx   . Esophageal cancer Neg Hx   . Liver cancer Neg Hx   . Pancreatic cancer Neg Hx   . Rectal cancer Neg Hx   . Stomach cancer Neg Hx     ROS: Review of Systems  Constitutional: Negative for weight loss.  Gastrointestinal: Negative for nausea and vomiting.  Musculoskeletal:       Negative for muscle weakness  Psychiatric/Behavioral: Positive for depression. Negative for suicidal ideas. The patient is nervous/anxious (anxiety).     PHYSICAL EXAM: Blood pressure 119/86, pulse 78, temperature 98.2 F (36.8 C), temperature source Oral, height 5\' 5"  (1.651 m), weight 173 lb (78.5 kg), SpO2 98 %. Body mass index is 28.79 kg/m. Physical Exam  Constitutional: She is oriented to person, place, and time. She appears  well-developed and well-nourished.  Cardiovascular: Normal rate.  Pulmonary/Chest: Effort normal.  Musculoskeletal: Normal range of motion.  Neurological: She is oriented to person, place, and time.  Skin: Skin is warm and dry.  Psychiatric: She has a normal mood and affect. Her behavior is normal.  Vitals reviewed.   RECENT LABS AND TESTS: BMET    Component Value Date/Time   NA 137 03/30/2018 0744   NA 142 02/15/2018 0817   K 4.3 03/30/2018 0744   CL 107 03/30/2018 0744   CO2 24 03/30/2018 0744   GLUCOSE 86 03/30/2018 0744   BUN 17 03/30/2018 0744   BUN 11 02/15/2018 0817   CREATININE 0.84 03/30/2018 0744   CALCIUM 9.5 03/30/2018 0744   GFRNONAA 74 02/15/2018 0817   GFRAA 85 02/15/2018 0817   Lab Results  Component Value Date   HGBA1C 5.6 02/15/2018   HGBA1C 5.7 (H) 09/13/2017   HGBA1C 6.2 (H) 05/17/2017   HGBA1C 5.8 10/12/2014   HGBA1C 5.7 09/14/2011   Lab Results  Component Value Date   INSULIN 28.8 (H) 02/15/2018   INSULIN 35.7 (H) 09/13/2017   INSULIN 27.9 (H) 05/17/2017   CBC    Component Value Date/Time   WBC 7.7 03/30/2018 0744   RBC 4.36 03/30/2018 0744   HGB 13.9 03/30/2018 0744   HGB 13.7 05/17/2017 1142   HCT 41.6 03/30/2018 0744   HCT 42.1 05/17/2017 1142   PLT 323.0 03/30/2018 0744   MCV 95.4 03/30/2018 0744   MCV 94 05/17/2017 1142   MCH 30.6 05/17/2017 1142   MCHC 33.5 03/30/2018 0744   RDW 15.0 03/30/2018 0744   RDW 14.2 05/17/2017 1142   LYMPHSABS 3.0 03/30/2018 0744   LYMPHSABS 3.9 (H) 05/17/2017 1142   MONOABS 0.6 03/30/2018 0744   EOSABS 0.1 03/30/2018 0744   EOSABS 0.1 05/17/2017 1142   BASOSABS 0.1 03/30/2018 0744   BASOSABS 0.0 05/17/2017 1142   Iron/TIBC/Ferritin/ %Sat No results found for: IRON, TIBC, FERRITIN, IRONPCTSAT Lipid Panel     Component Value Date/Time   CHOL 199 03/30/2018 0744   CHOL 213 (H) 02/15/2018 0817   TRIG 66.0 03/30/2018 0744   HDL 69.70 03/30/2018 0744   HDL 62 02/15/2018 0817   CHOLHDL 3  03/30/2018 0744   VLDL 13.2 03/30/2018 0744   LDLCALC 116 (H) 03/30/2018 0744   LDLCALC 140 (H) 02/15/2018 0817   Hepatic Function Panel     Component Value Date/Time   PROT 7.1 03/30/2018 0744   PROT 7.4 03/30/2018 0744   PROT 6.9 02/15/2018 0817   ALBUMIN 4.3 03/30/2018 0744   ALBUMIN 4.3 02/15/2018 0817   AST 16 03/30/2018 0744   ALT  14 03/30/2018 0744   ALKPHOS 87 03/30/2018 0744   BILITOT 0.5 03/30/2018 0744   BILITOT 0.3 02/15/2018 0817   BILIDIR 0.1 03/30/2018 0744   IBILI 0.2 09/23/2011 0911      Component Value Date/Time   TSH 1.32 03/30/2018 0744   TSH 0.644 05/17/2017 1142   TSH 1.53 03/23/2017 0705   Results for Yvonne [name], Yvonne [name] (MRN [id number]) as of 07/05/2018 12:29  Ref. Range 02/15/2018 08:17  Vitamin D, 25-Hydroxy Latest Ref Range: 30.0 - 100.0 ng/mL 51.1   ASSESSMENT AND PLAN: Depression with anxiety - with emotional eating - Plan: escitalopram (LEXAPRO) 10 MG tablet  Vitamin D deficiency - Plan: Vitamin D, Ergocalciferol, (DRISDOL) 50000 units CAPS capsule  Class 1 obesity with serious comorbidity and body mass index (BMI) of 30.0 to 30.9 in adult, unspecified obesity type - beginning BMI >30  Attention deficit disorder, unspecified hyperactivity presence - Plan: buPROPion (WELLBUTRIN SR) 200 MG 12 hr tablet  Depression with anxiety - Plan: escitalopram (LEXAPRO) 10 MG tablet  PLAN:  Vitamin D Deficiency Yvonne [name] was informed that low vitamin D levels contributes to fatigue and are associated with obesity, breast, and colon cancer. She agrees to continue to take prescription Vit D @50 ,000 IU every week #4 with no refills and will follow up for routine testing of vitamin D, at least 2-3 times per year. She was informed of the risk of over-replacement of vitamin D and agrees to not increase her dose unless she discusses this with [place] first. Yvonne [name] agrees to follow up as directed.  Depression with Anxiety We discussed behavior modification techniques today to  help Yvonne [name] [facility] deal with her emotional eating and depression. She has agreed to continue Wellbutrin SR 200 mg qd #30 with no refills and Lexapro 10 mg qd #30 with no refills and follow up as directed.  Obesity Yvonne [name] is currently in the action stage of change. As such, her goal is to continue with weight loss efforts She has agreed to change to the Pescatarian eating plan +300 calories Yvonne [name] has been instructed to work up to a goal of 150 minutes of combined cardio and strengthening exercise per week for weight loss and overall health benefits. We discussed the following Behavioral Modification Strategies today: better snacking choices, increasing lean protein intake and decreasing simple carbohydrates   Yvonne [name] has agreed to follow up with our clinic in 2 to 3 weeks. She was informed of the importance of frequent follow up visits to maximize her success with intensive lifestyle modifications for her multiple health conditions.   I, Doreene Nest, am acting as transcriptionist for Dennard Nip, MD  I have reviewed the above documentation for accuracy and completeness, and I agree with the above. -Dennard Nip, MD

## 2018-07-20 MED FILL — ESCITALOPRAM 10 MG TABLET: 10 | 30 days supply | Qty: 30 | Fill #0

## 2018-07-20 MED FILL — VIT D2 1.25 MG (50,000 UNIT: 1.25 MG | 28 days supply | Qty: 4 | Fill #0

## 2018-07-20 MED FILL — NORETHIND-ETH ESTRAD 1-0.02: 1-20 | 84 days supply | Qty: 84 | Fill #1

## 2018-07-20 MED FILL — BUPROPION HCL SR 200 MG TAB: 200 | 30 days supply | Qty: 30 | Fill #0

## 2018-08-01 ENCOUNTER — Ambulatory Visit (INDEPENDENT_AMBULATORY_CARE_PROVIDER_SITE_OTHER): Payer: 59 | Admitting: Family Medicine

## 2018-08-01 VITALS — BP 112/77 | HR 84 | Temp 98.4°F | Ht 65.0 in | Wt 175.0 lb

## 2018-08-01 DIAGNOSIS — Z9189 Other specified personal risk factors, not elsewhere classified: Secondary | ICD-10-CM | POA: Diagnosis not present

## 2018-08-01 DIAGNOSIS — F3289 Other specified depressive episodes: Secondary | ICD-10-CM | POA: Diagnosis not present

## 2018-08-01 DIAGNOSIS — Z6830 Body mass index (BMI) 30.0-30.9, adult: Secondary | ICD-10-CM

## 2018-08-01 DIAGNOSIS — E559 Vitamin D deficiency, unspecified: Secondary | ICD-10-CM

## 2018-08-01 DIAGNOSIS — E669 Obesity, unspecified: Secondary | ICD-10-CM

## 2018-08-01 MED ORDER — TOPIRAMATE 50 MG PO TABS: 50.0000 mg | ORAL_TABLET | Freq: Every day | ORAL | 0 refills | Status: DC

## 2018-08-01 MED ORDER — BUPROPION HCL ER (SR) 150 MG PO TB12: 150.0000 mg | ORAL_TABLET | Freq: Every day | ORAL | 0 refills | Status: DC

## 2018-08-01 MED ORDER — ESCITALOPRAM OXALATE 10 MG PO TABS: 10.0000 mg | ORAL_TABLET | Freq: Every day | ORAL | 0 refills | Status: DC

## 2018-08-01 MED ORDER — VITAMIN D (ERGOCALCIFEROL) 1.25 MG (50000 UNIT) PO CAPS: 50000.0000 [IU] | ORAL_CAPSULE | ORAL | 0 refills | Status: DC

## 2018-08-01 MED FILL — BUPROPION SR 150 MG TABLET: 150 | 30 days supply | Qty: 30 | Fill #0

## 2018-08-01 MED FILL — TOPIRAMATE 50 MG TABLET: 50 | 30 days supply | Qty: 30 | Fill #0

## 2018-08-02 NOTE — Progress Notes (Signed)
Office: [phone]  /  Fax: [phone]   HPI:   Chief Complaint: OBESITY Yvonne [name] is here to discuss her progress with her obesity treatment plan. She is on the Pescatarian eating plan and is following her eating plan approximately 60 % of the time. She states she is walking 30 to 45 minutes 5 times per week. Yvonne [name] has struggled to stay on track in the last few weeks with increased simple carbohydrate cravings. Yvonne [name] states she is ready to get back on track. Her weight is 175 lb (79.4 kg) today and has not lost weight since her last visit. She has lost 29 lbs since starting treatment with [place].  Vitamin D deficiency Yvonne [name] has a diagnosis of vitamin D deficiency. She is stable on vit D and denies nausea, vomiting or muscle weakness.  At risk for diabetes Yvonne [name] is at higher than average risk for developing diabetes due to her obesity. She currently denies polyuria or polydipsia.  Depression with emotional eating behaviors Yvonne [name] notes mild hand tremors with the increased dose of wellbutrin. She feels she is did well on the lower dose of wellbutrin and she didn't have tremors at that point. Yvonne [name] struggles with emotional eating and using food for comfort to the extent that it is negatively impacting her health. She often snacks when she is not hungry. Yvonne [name] sometimes feels she is out of control and then feels guilty that she made poor food choices. She has been working on behavior modification techniques to help reduce her emotional eating and has been somewhat successful. She shows no sign of suicidal or homicidal ideations.  Depression screen [facility] 2/9 06/06/2018 05/17/2017 04/02/2017  Decreased Interest 0 3 0  Down, Depressed, Hopeless 0 0 0  PHQ - 2 Score 0 3 0  Altered sleeping 1 3 0  Tired, decreased energy 1 3 0  Change in appetite 0 0 1  Feeling bad or failure about yourself  0 0 0  Trouble concentrating 3 0 0  Moving slowly or fidgety/restless 0 0 0  Suicidal thoughts 0 0 0    PHQ-9 Score 5 9 1      ALLERGIES: No Known Allergies  MEDICATIONS: Current Outpatient Medications on File Prior to Visit  Medication Sig Dispense Refill   norethindrone-ethinyl estradiol (JUNEL 1/20) 1-20 MG-MCG tablet Take 1 tablet by mouth daily.     polyethylene glycol powder (GLYCOLAX/MIRALAX) powder Take 17 g by mouth daily. 3350 g 0   Current Facility-Administered Medications on File Prior to Visit  Medication Dose Route Frequency Provider Last Rate Last Dose   0.9 %  sodium chloride infusion  500 mL Intravenous Once Pyrtle, Lajuan Lines, MD        PAST MEDICAL HISTORY: Past Medical History:  Diagnosis Date   Anemia    Arthritis    High blood pressure    Joint pain    Kidney stones    Microscopic hematuria    Vitamin D deficiency     PAST SURGICAL HISTORY: Past Surgical History:  Procedure Laterality Date   NO PAST SURGERIES     Denies surgical history    SOCIAL HISTORY: Social History   Tobacco Use   Smoking status: Never Smoker   Smokeless tobacco: Never Used  Substance Use Topics   Alcohol use: No   Drug use: No    FAMILY HISTORY: Family History  Problem Relation Age of Onset   Healthy Father    Congestive Heart Failure Father    Heart disease Father  Diabetes Father    Other Mother        deceased from multiple myelomas   Cancer Mother    Colon cancer Neg Hx    Esophageal cancer Neg Hx    Liver cancer Neg Hx    Pancreatic cancer Neg Hx    Rectal cancer Neg Hx    Stomach cancer Neg Hx     ROS: Review of Systems  Constitutional: Negative for weight loss.  Gastrointestinal: Negative for nausea and vomiting.  Genitourinary: Negative for frequency.  Musculoskeletal:       Negative for muscle weakness  Neurological: Positive for tremors.  Endo/Heme/Allergies: Negative for polydipsia.  Psychiatric/Behavioral: Positive for depression. Negative for suicidal ideas.    PHYSICAL EXAM: Blood pressure 112/77, pulse 84,  temperature 98.4 F (36.9 C), temperature source Oral, height 5\' 5"  (1.651 m), weight 175 lb (79.4 kg), SpO2 98 %. Body mass index is 29.12 kg/m. Physical Exam  Constitutional: She is oriented to person, place, and time. She appears well-developed and well-nourished.  Cardiovascular: Normal rate.  Pulmonary/Chest: Effort normal.  Musculoskeletal: Normal range of motion.  Neurological: She is oriented to person, place, and time.  Skin: Skin is warm and dry.  Psychiatric: She has a normal mood and affect. Her behavior is normal.  Vitals reviewed.   RECENT LABS AND TESTS: BMET    Component Value Date/Time   NA 137 03/30/2018 0744   NA 142 02/15/2018 0817   K 4.3 03/30/2018 0744   CL 107 03/30/2018 0744   CO2 24 03/30/2018 0744   GLUCOSE 86 03/30/2018 0744   BUN 17 03/30/2018 0744   BUN 11 02/15/2018 0817   CREATININE 0.84 03/30/2018 0744   CALCIUM 9.5 03/30/2018 0744   GFRNONAA 74 02/15/2018 0817   GFRAA 85 02/15/2018 0817   Lab Results  Component Value Date   HGBA1C 5.6 02/15/2018   HGBA1C 5.7 (H) 09/13/2017   HGBA1C 6.2 (H) 05/17/2017   HGBA1C 5.8 10/12/2014   HGBA1C 5.7 09/14/2011   Lab Results  Component Value Date   INSULIN 28.8 (H) 02/15/2018   INSULIN 35.7 (H) 09/13/2017   INSULIN 27.9 (H) 05/17/2017   CBC    Component Value Date/Time   WBC 7.7 03/30/2018 0744   RBC 4.36 03/30/2018 0744   HGB 13.9 03/30/2018 0744   HGB 13.7 05/17/2017 1142   HCT 41.6 03/30/2018 0744   HCT 42.1 05/17/2017 1142   PLT 323.0 03/30/2018 0744   MCV 95.4 03/30/2018 0744   MCV 94 05/17/2017 1142   MCH 30.6 05/17/2017 1142   MCHC 33.5 03/30/2018 0744   RDW 15.0 03/30/2018 0744   RDW 14.2 05/17/2017 1142   LYMPHSABS 3.0 03/30/2018 0744   LYMPHSABS 3.9 (H) 05/17/2017 1142   MONOABS 0.6 03/30/2018 0744   EOSABS 0.1 03/30/2018 0744   EOSABS 0.1 05/17/2017 1142   BASOSABS 0.1 03/30/2018 0744   BASOSABS 0.0 05/17/2017 1142   Iron/TIBC/Ferritin/ %Sat No results found for:  IRON, TIBC, FERRITIN, IRONPCTSAT Lipid Panel     Component Value Date/Time   CHOL 199 03/30/2018 0744   CHOL 213 (H) 02/15/2018 0817   TRIG 66.0 03/30/2018 0744   HDL 69.70 03/30/2018 0744   HDL 62 02/15/2018 0817   CHOLHDL 3 03/30/2018 0744   VLDL 13.2 03/30/2018 0744   LDLCALC 116 (H) 03/30/2018 0744   LDLCALC 140 (H) 02/15/2018 0817   Hepatic Function Panel     Component Value Date/Time   PROT 7.1 03/30/2018 0744   PROT 7.4  03/30/2018 0744   PROT 6.9 02/15/2018 0817   ALBUMIN 4.3 03/30/2018 0744   ALBUMIN 4.3 02/15/2018 0817   AST 16 03/30/2018 0744   ALT 14 03/30/2018 0744   ALKPHOS 87 03/30/2018 0744   BILITOT 0.5 03/30/2018 0744   BILITOT 0.3 02/15/2018 0817   BILIDIR 0.1 03/30/2018 0744   IBILI 0.2 09/23/2011 0911      Component Value Date/Time   TSH 1.32 03/30/2018 0744   TSH 0.644 05/17/2017 1142   TSH 1.53 03/23/2017 0705   Results for ANVIKA, GASHI (MRN [id number]) as of 08/02/2018 11:43  Ref. Range 02/15/2018 08:17  Vitamin D, 25-Hydroxy Latest Ref Range: 30.0 - 100.0 ng/mL 51.1   ASSESSMENT AND PLAN: Vitamin D deficiency - Plan: Vitamin D, Ergocalciferol, (DRISDOL) 50000 units CAPS capsule  Other depression - with emotional eating - Plan: topiramate (TOPAMAX) 50 MG tablet, escitalopram (LEXAPRO) 10 MG tablet, buPROPion (WELLBUTRIN SR) 150 MG 12 hr tablet  At risk for diabetes mellitus  Class 1 obesity with serious comorbidity and body mass index (BMI) of 30.0 to 30.9 in adult, unspecified obesity type - Starting BMI greater then 30  PLAN:  Vitamin D Deficiency Yvonne [name] was informed that low vitamin D levels contributes to fatigue and are associated with obesity, breast, and colon cancer. She agrees to continue to take prescription Vit D @50 ,000 IU every week #4 with no refills and will follow up for routine testing of vitamin D, at least 2-3 times per year. She was informed of the risk of over-replacement of vitamin D and agrees to not increase her dose  unless she discusses this with [place] first. Yvonne [name] agreed to follow up as directed.  Diabetes risk counseling Yvonne [name] was given extended (15 minutes) diabetes prevention counseling today. She is 47 y.o. female and has risk factors for diabetes including obesity. We discussed intensive lifestyle modifications today with an emphasis on weight loss as well as increasing exercise and decreasing simple carbohydrates in her diet.  Depression with Emotional Eating Behaviors We discussed behavior modification techniques today to help [facility] deal with her emotional eating and depression. She has agreed to decrease Wellbutrin SR to 150 mg qAM #30 with no refills and start Topamax 50 mg qHS #30 with no refills. She agreed to continue Lexapro 10 mg daily #30 with no refills and follow up as directed.  Obesity Yvonne [name] is currently in the action stage of change. As such, her goal is to continue with weight loss efforts She has agreed to follow the Category 3 plan Yvonne [name] has been instructed to work up to a goal of 150 minutes of combined cardio and strengthening exercise per week for weight loss and overall health benefits. We discussed the following Behavioral Modification Strategies today: increasing lean protein intake, work on meal planning and easy cooking plans and emotional eating strategies  Yvonne [name] has agreed to follow up with our clinic in 2 to 3 weeks. She was informed of the importance of frequent follow up visits to maximize her success with intensive lifestyle modifications for her multiple health conditions.   OBESITY BEHAVIORAL INTERVENTION VISIT  Today's visit was # 25  Starting weight: 204 lbs Starting date: 05/17/17 Today's weight : 175 lbs Today's date: 08/01/2018 Total lbs lost to date: 29 At least 15 minutes were spent on discussing the following behavioral intervention visit.   ASK: We discussed the diagnosis of obesity with Yvonne [name] today and Yvonne [name] agreed to give [place] permission  to discuss obesity behavioral modification therapy  today.  ASSESS: Yvonne [name] has the diagnosis of obesity and her BMI today is 29.12 Yvonne [name] is in the action stage of change   ADVISE: Yvonne [name] was educated on the multiple health risks of obesity as well as the benefit of weight loss to improve her health. She was advised of the need for long term treatment and the importance of lifestyle modifications to improve her current health and to decrease her risk of future health problems.  AGREE: Multiple dietary modification options and treatment options were discussed and  Yvonne [name] agreed to follow the recommendations documented in the above note.  ARRANGE: Yvonne [name] was educated on the importance of frequent visits to treat obesity as outlined per CMS and USPSTF guidelines and agreed to schedule her next follow up appointment today.  I, Doreene Nest, am acting as transcriptionist for Dennard Nip, MD  I have reviewed the above documentation for accuracy and completeness, and I agree with the above. -Dennard Nip, MD

## 2018-08-15 ENCOUNTER — Encounter (INDEPENDENT_AMBULATORY_CARE_PROVIDER_SITE_OTHER): Payer: Self-pay

## 2018-08-15 ENCOUNTER — Ambulatory Visit (INDEPENDENT_AMBULATORY_CARE_PROVIDER_SITE_OTHER): Payer: 59 | Admitting: Family Medicine

## 2018-08-15 VITALS — BP 116/80 | HR 82 | Temp 98.6°F | Ht 65.0 in | Wt 174.0 lb

## 2018-08-15 DIAGNOSIS — E669 Obesity, unspecified: Secondary | ICD-10-CM

## 2018-08-15 DIAGNOSIS — E559 Vitamin D deficiency, unspecified: Secondary | ICD-10-CM

## 2018-08-15 DIAGNOSIS — F3289 Other specified depressive episodes: Secondary | ICD-10-CM | POA: Diagnosis not present

## 2018-08-15 DIAGNOSIS — Z9189 Other specified personal risk factors, not elsewhere classified: Secondary | ICD-10-CM

## 2018-08-15 DIAGNOSIS — Z6830 Body mass index (BMI) 30.0-30.9, adult: Secondary | ICD-10-CM | POA: Diagnosis not present

## 2018-08-16 NOTE — Progress Notes (Signed)
Office: [phone]  /  Fax: [phone]   HPI:   Chief Complaint: OBESITY Yvonne [name] is here to discuss her progress with her obesity treatment plan. She is on the Category 3 plan and is following her eating plan approximately 80 % of the time. She states she is walking for 30-45 minutes 5 times per week. Miniya continues to do well with weight loss, but she is struggling with meal prep and planning. She notes her hunger is controlled. She feels she is doing better with emotional eating with decreased work stress.  Her weight is 174 lb (78.9 kg) today and has had a weight loss of 1 pound over a period of 2 weeks since her last visit. She has lost 30 lbs since starting treatment with [place].  Vitamin D Deficiency Yvonne [name] has a diagnosis of vitamin D deficiency. She is stable on prescription Vit D, but level is not yet at goal. She denies nausea, vomiting or muscle weakness.  Depression with emotional eating behaviors Yvonne [name] felt the topiramate gave her insomnia and she stopped taking it. She feels her mood is better and would like to come off her medications. Yvonne [name] struggles with emotional eating and using food for comfort to the extent that it is negatively impacting her health. She often snacks when she is not hungry. Yvonne [name] sometimes feels she is out of control and then feels guilty that she made poor food choices. She has been working on behavior modification techniques to help reduce her emotional eating and has been somewhat successful. She shows no sign of suicidal or homicidal ideations.  Depression screen [facility] 2/9 06/06/2018 05/17/2017 04/02/2017  Decreased Interest 0 3 0  Down, Depressed, Hopeless 0 0 0  PHQ - 2 Score 0 3 0  Altered sleeping 1 3 0  Tired, decreased energy 1 3 0  Change in appetite 0 0 1  Feeling bad or failure about yourself  0 0 0  Trouble concentrating 3 0 0  Moving slowly or fidgety/restless 0 0 0  Suicidal thoughts 0 0 0  PHQ-9 Score 5 9 1    At risk for  cardiovascular disease Yvonne [name] is at a higher than average risk for cardiovascular disease due to obesity. She currently denies any chest pain.  ALLERGIES: No Known Allergies  MEDICATIONS: Current Outpatient Medications on File Prior to Visit  Medication Sig Dispense Refill  . escitalopram (LEXAPRO) 10 MG tablet Take 1 tablet (10 mg total) by mouth daily. 30 tablet 0  . norethindrone-ethinyl estradiol (JUNEL 1/20) 1-20 MG-MCG tablet Take 1 tablet by mouth daily.    . polyethylene glycol powder (GLYCOLAX/MIRALAX) powder Take 17 g by mouth daily. 3350 g 0  . Vitamin D, Ergocalciferol, (DRISDOL) 50000 units CAPS capsule Take 1 capsule (50,000 Units total) by mouth every 7 (seven) days. 4 capsule 0   Current Facility-Administered Medications on File Prior to Visit  Medication Dose Route Frequency Provider Last Rate Last Dose  . 0.9 %  sodium chloride infusion  500 mL Intravenous Once Pyrtle, Lajuan Lines, MD        PAST MEDICAL HISTORY: Past Medical History:  Diagnosis Date  . Anemia   . Arthritis   . High blood pressure   . Joint pain   . Kidney stones   . Microscopic hematuria   . Vitamin D deficiency     PAST SURGICAL HISTORY: Past Surgical History:  Procedure Laterality Date  . NO PAST SURGERIES     Denies surgical history    SOCIAL  HISTORY: Social History   Tobacco Use  . Smoking status: Never Smoker  . Smokeless tobacco: Never Used  Substance Use Topics  . Alcohol use: No  . Drug use: No    FAMILY HISTORY: Family History  Problem Relation Age of Onset  . Healthy Father   . Congestive Heart Failure Father   . Heart disease Father   . Diabetes Father   . Other Mother        deceased from multiple myelomas  . Cancer Mother   . Colon cancer Neg Hx   . Esophageal cancer Neg Hx   . Liver cancer Neg Hx   . Pancreatic cancer Neg Hx   . Rectal cancer Neg Hx   . Stomach cancer Neg Hx     ROS: Review of Systems  Constitutional: Positive for weight loss.    Cardiovascular: Negative for chest pain.  Gastrointestinal: Negative for nausea and vomiting.  Musculoskeletal:       Negative muscle weakness  Psychiatric/Behavioral: Positive for depression. Negative for suicidal ideas.    PHYSICAL EXAM: Blood pressure 116/80, pulse 82, temperature 98.6 F (37 C), temperature source Oral, height 5\' 5"  (1.651 m), weight 174 lb (78.9 kg), SpO2 98 %. Body mass index is 28.96 kg/m. Physical Exam  Constitutional: She is oriented to person, place, and time. She appears well-developed and well-nourished.  Cardiovascular: Normal rate.  Pulmonary/Chest: Effort normal.  Musculoskeletal: Normal range of motion.  Neurological: She is oriented to person, place, and time.  Skin: Skin is warm and dry.  Psychiatric: She has a normal mood and affect. Her behavior is normal.  Vitals reviewed.   RECENT LABS AND TESTS: BMET    Component Value Date/Time   NA 137 03/30/2018 0744   NA 142 02/15/2018 0817   K 4.3 03/30/2018 0744   CL 107 03/30/2018 0744   CO2 24 03/30/2018 0744   GLUCOSE 86 03/30/2018 0744   BUN 17 03/30/2018 0744   BUN 11 02/15/2018 0817   CREATININE 0.84 03/30/2018 0744   CALCIUM 9.5 03/30/2018 0744   GFRNONAA 74 02/15/2018 0817   GFRAA 85 02/15/2018 0817   Lab Results  Component Value Date   HGBA1C 5.6 02/15/2018   HGBA1C 5.7 (H) 09/13/2017   HGBA1C 6.2 (H) 05/17/2017   HGBA1C 5.8 10/12/2014   HGBA1C 5.7 09/14/2011   Lab Results  Component Value Date   INSULIN 28.8 (H) 02/15/2018   INSULIN 35.7 (H) 09/13/2017   INSULIN 27.9 (H) 05/17/2017   CBC    Component Value Date/Time   WBC 7.7 03/30/2018 0744   RBC 4.36 03/30/2018 0744   HGB 13.9 03/30/2018 0744   HGB 13.7 05/17/2017 1142   HCT 41.6 03/30/2018 0744   HCT 42.1 05/17/2017 1142   PLT 323.0 03/30/2018 0744   MCV 95.4 03/30/2018 0744   MCV 94 05/17/2017 1142   MCH 30.6 05/17/2017 1142   MCHC 33.5 03/30/2018 0744   RDW 15.0 03/30/2018 0744   RDW 14.2 05/17/2017  1142   LYMPHSABS 3.0 03/30/2018 0744   LYMPHSABS 3.9 (H) 05/17/2017 1142   MONOABS 0.6 03/30/2018 0744   EOSABS 0.1 03/30/2018 0744   EOSABS 0.1 05/17/2017 1142   BASOSABS 0.1 03/30/2018 0744   BASOSABS 0.0 05/17/2017 1142   Iron/TIBC/Ferritin/ %Sat No results found for: IRON, TIBC, FERRITIN, IRONPCTSAT Lipid Panel     Component Value Date/Time   CHOL 199 03/30/2018 0744   CHOL 213 (H) 02/15/2018 0817   TRIG 66.0 03/30/2018 0744   HDL  69.70 03/30/2018 0744   HDL 62 02/15/2018 0817   CHOLHDL 3 03/30/2018 0744   VLDL 13.2 03/30/2018 0744   LDLCALC 116 (H) 03/30/2018 0744   LDLCALC 140 (H) 02/15/2018 0817   Hepatic Function Panel     Component Value Date/Time   PROT 7.1 03/30/2018 0744   PROT 7.4 03/30/2018 0744   PROT 6.9 02/15/2018 0817   ALBUMIN 4.3 03/30/2018 0744   ALBUMIN 4.3 02/15/2018 0817   AST 16 03/30/2018 0744   ALT 14 03/30/2018 0744   ALKPHOS 87 03/30/2018 0744   BILITOT 0.5 03/30/2018 0744   BILITOT 0.3 02/15/2018 0817   BILIDIR 0.1 03/30/2018 0744   IBILI 0.2 09/23/2011 0911      Component Value Date/Time   TSH 1.32 03/30/2018 0744   TSH 0.644 05/17/2017 1142   TSH 1.53 03/23/2017 0705  Results for RIAN, KOON (MRN [id number]) as of 08/16/2018 09:32  Ref. Range 02/15/2018 08:17  Vitamin D, 25-Hydroxy Latest Ref Range: 30.0 - 100.0 ng/mL 51.1    ASSESSMENT AND PLAN: Other depression - with emotional eating  Vitamin D deficiency  At risk for heart disease  Class 1 obesity with serious comorbidity and body mass index (BMI) of 30.0 to 30.9 in adult, unspecified obesity type - starting BMI >30  PLAN:  Vitamin D Deficiency Remedy was informed that low vitamin D levels contributes to fatigue and are associated with obesity, breast, and colon cancer. Shawana agrees to continue taking prescription Vit D @50 ,000 IU every week and will follow up for routine testing of vitamin D, at least 2-3 times per year. She was informed of the risk of  over-replacement of vitamin D and agrees to not increase her dose unless she discusses this with [place] first. Arnesia agrees to follow up with our clinic in 4 weeks.  Depression with Emotional Eating Behaviors We discussed behavior modification techniques today to help [facility] deal with her emotional eating and depression. Hennesy agrees to discontinue topiramate and Wellbutrin and she will continue Lexapro as is for now. Khali agrees to follow up with our clinic in 4 weeks.  Cardiovascular risk counselling Sravya was given extended (15 minutes) coronary artery disease prevention counseling today. She is 47 y.o. female and has risk factors for heart disease including obesity. We discussed intensive lifestyle modifications today with an emphasis on specific weight loss instructions and strategies. Pt was also informed of the importance of increasing exercise and decreasing saturated fats to help prevent heart disease.  Obesity Darbi is currently in the action stage of change. As such, her goal is to continue with weight loss efforts She has agreed to follow the Category 3 plan Ayako has been instructed to work up to a goal of 150 minutes of combined cardio and strengthening exercise per week for weight loss and overall health benefits. We discussed the following Behavioral Modification Strategies today: increasing lean protein intake, decreasing simple carbohydrates  and work on meal planning and easy cooking plans   Joleene has agreed to follow up with our clinic in 4 weeks. She was informed of the importance of frequent follow up visits to maximize her success with intensive lifestyle modifications for her multiple health conditions.   OBESITY BEHAVIORAL INTERVENTION VISIT  Today's visit was # 26  Starting weight: 204 lbs Starting date: 05/17/17 Today's weight : 174 lbs  Today's date: 08/15/2018 Total lbs lost to date: 39    ASK: We discussed the diagnosis of obesity with Aldona Lento  today and  Darlinda agreed to give [place] permission to discuss obesity behavioral modification therapy today.  ASSESS: Laiylah has the diagnosis of obesity and her BMI today is 28.9 Quaniya is in the action stage of change   ADVISE: Jaquay was educated on the multiple health risks of obesity as well as the benefit of weight loss to improve her health. She was advised of the need for long term treatment and the importance of lifestyle modifications to improve her current health and to decrease her risk of future health problems.  AGREE: Multiple dietary modification options and treatment options were discussed and  Zhoey agreed to follow the recommendations documented in the above note.  ARRANGE: Iasia was educated on the importance of frequent visits to treat obesity as outlined per CMS and USPSTF guidelines and agreed to schedule her next follow up appointment today.  I, Trixie Dredge, am acting as transcriptionist for Dennard Nip, MD  I have reviewed the above documentation for accuracy and completeness, and I agree with the above. -Dennard Nip, MD

## 2018-08-24 ENCOUNTER — Ambulatory Visit (INDEPENDENT_AMBULATORY_CARE_PROVIDER_SITE_OTHER): Payer: Self-pay | Admitting: Family Medicine

## 2018-08-24 DIAGNOSIS — N938 Other specified abnormal uterine and vaginal bleeding: Secondary | ICD-10-CM | POA: Diagnosis not present

## 2018-08-24 MED FILL — NORETHINDRONE 0.35 MG TAB: 0.35 | 84 days supply | Qty: 84 | Fill #0

## 2018-08-29 MED FILL — VIT D2 1.25 MG (50,000 UNIT: 1.25 MG | 28 days supply | Qty: 4 | Fill #0

## 2018-09-12 ENCOUNTER — Ambulatory Visit (INDEPENDENT_AMBULATORY_CARE_PROVIDER_SITE_OTHER): Payer: 59 | Admitting: Family Medicine

## 2018-09-12 VITALS — BP 123/89 | HR 91 | Temp 98.3°F | Ht 65.0 in | Wt 177.0 lb

## 2018-09-12 DIAGNOSIS — E669 Obesity, unspecified: Secondary | ICD-10-CM | POA: Diagnosis not present

## 2018-09-12 DIAGNOSIS — Z6830 Body mass index (BMI) 30.0-30.9, adult: Secondary | ICD-10-CM | POA: Diagnosis not present

## 2018-09-12 DIAGNOSIS — E559 Vitamin D deficiency, unspecified: Secondary | ICD-10-CM | POA: Diagnosis not present

## 2018-09-12 DIAGNOSIS — Z9189 Other specified personal risk factors, not elsewhere classified: Secondary | ICD-10-CM

## 2018-09-12 DIAGNOSIS — E66811 Obesity, class 1: Secondary | ICD-10-CM

## 2018-09-12 MED ORDER — VITAMIN D (ERGOCALCIFEROL) 1.25 MG (50000 UNIT) PO CAPS: 50000.0000 [IU] | ORAL_CAPSULE | ORAL | 0 refills | Status: DC

## 2018-09-13 NOTE — Progress Notes (Signed)
Office: [phone]  /  Fax: [phone]   HPI:   Chief Complaint: OBESITY Yvonne [name] is here to discuss her progress with her obesity treatment plan. She is on the  follow the Category 3 plan and is following her eating plan approximately 70 % of the time. She states she is exercising by walking for 30 minutes 3 times per week. [name] is currently struggling with increased stressors and has gotten off track especially with her meal prepping and planning. She reports skipping meals. She states she is ready to get back on track.  Her weight is 177 lb (80.3 kg) today and has not lost weight since her last visit. She has lost 27 lbs since starting treatment with [place].  Vitamin D deficiency [name] has a diagnosis of vitamin D deficiency. She is currently taking vit D and denies nausea, vomiting or muscle weakness.   Ref. Range 02/15/2018 08:17  Vitamin D, 25-Hydroxy Latest Ref Range: 30.0 - 100.0 ng/mL 51.1   At risk for osteopenia and osteoporosis [name] is at higher risk of osteopenia and osteoporosis due to vitamin D deficiency.   ALLERGIES: No Known Allergies  MEDICATIONS: Current Outpatient Medications on File Prior to Visit  Medication Sig Dispense Refill  . escitalopram (LEXAPRO) 10 MG tablet Take 1 tablet (10 mg total) by mouth daily. 30 tablet 0  . norethindrone (CAMILA) 0.35 MG tablet Take 1 tablet by mouth daily.    . polyethylene glycol powder (GLYCOLAX/MIRALAX) powder Take 17 g by mouth daily. 3350 g 0   Current Facility-Administered Medications on File Prior to Visit  Medication Dose Route Frequency Provider Last Rate Last Dose  . 0.9 %  sodium chloride infusion  500 mL Intravenous Once Pyrtle, Lajuan Lines, MD        PAST MEDICAL HISTORY: Past Medical History:  Diagnosis Date  . Anemia   . Arthritis   . High blood pressure   . Joint pain   . Kidney stones   . Microscopic hematuria   . Vitamin D deficiency     PAST SURGICAL HISTORY: Past Surgical History:  Procedure  Laterality Date  . NO PAST SURGERIES     Denies surgical history    SOCIAL HISTORY: Social History   Tobacco Use  . Smoking status: Never Smoker  . Smokeless tobacco: Never Used  Substance Use Topics  . Alcohol use: No  . Drug use: No    FAMILY HISTORY: Family History  Problem Relation Age of Onset  . Healthy Father   . Congestive Heart Failure Father   . Heart disease Father   . Diabetes Father   . Other Mother        deceased from multiple myelomas  . Cancer Mother   . Colon cancer Neg Hx   . Esophageal cancer Neg Hx   . Liver cancer Neg Hx   . Pancreatic cancer Neg Hx   . Rectal cancer Neg Hx   . Stomach cancer Neg Hx     ROS: Review of Systems  Constitutional: Negative for weight loss.  Gastrointestinal: Negative for nausea and vomiting.  Musculoskeletal:       Negative for muscle weakness    PHYSICAL EXAM: Blood pressure 123/89, pulse 91, temperature 98.3 F (36.8 C), temperature source Oral, height 5\' 5"  (1.651 m), weight 177 lb (80.3 kg), SpO2 100 %. Body mass index is 29.45 kg/m. Physical Exam  Constitutional: She is oriented to person, place, and time. She appears well-developed and well-nourished.  HENT:  Head:  Normocephalic.  Neck: Normal range of motion.  Cardiovascular: Normal rate.  Pulmonary/Chest: Effort normal.  Musculoskeletal: Normal range of motion.  Neurological: She is alert and oriented to person, place, and time.  Skin: Skin is warm and dry.  Psychiatric: She has a normal mood and affect. Her behavior is normal.  Vitals reviewed.   RECENT LABS AND TESTS: BMET    Component Value Date/Time   NA 137 03/30/2018 0744   NA 142 02/15/2018 0817   K 4.3 03/30/2018 0744   CL 107 03/30/2018 0744   CO2 24 03/30/2018 0744   GLUCOSE 86 03/30/2018 0744   BUN 17 03/30/2018 0744   BUN 11 02/15/2018 0817   CREATININE 0.84 03/30/2018 0744   CALCIUM 9.5 03/30/2018 0744   GFRNONAA 74 02/15/2018 0817   GFRAA 85 02/15/2018 0817   Lab  Results  Component Value Date   HGBA1C 5.6 02/15/2018   HGBA1C 5.7 (H) 09/13/2017   HGBA1C 6.2 (H) 05/17/2017   HGBA1C 5.8 10/12/2014   HGBA1C 5.7 09/14/2011   Lab Results  Component Value Date   INSULIN 28.8 (H) 02/15/2018   INSULIN 35.7 (H) 09/13/2017   INSULIN 27.9 (H) 05/17/2017   CBC    Component Value Date/Time   WBC 7.7 03/30/2018 0744   RBC 4.36 03/30/2018 0744   HGB 13.9 03/30/2018 0744   HGB 13.7 05/17/2017 1142   HCT 41.6 03/30/2018 0744   HCT 42.1 05/17/2017 1142   PLT 323.0 03/30/2018 0744   MCV 95.4 03/30/2018 0744   MCV 94 05/17/2017 1142   MCH 30.6 05/17/2017 1142   MCHC 33.5 03/30/2018 0744   RDW 15.0 03/30/2018 0744   RDW 14.2 05/17/2017 1142   LYMPHSABS 3.0 03/30/2018 0744   LYMPHSABS 3.9 (H) 05/17/2017 1142   MONOABS 0.6 03/30/2018 0744   EOSABS 0.1 03/30/2018 0744   EOSABS 0.1 05/17/2017 1142   BASOSABS 0.1 03/30/2018 0744   BASOSABS 0.0 05/17/2017 1142   Iron/TIBC/Ferritin/ %Sat No results found for: IRON, TIBC, FERRITIN, IRONPCTSAT Lipid Panel     Component Value Date/Time   CHOL 199 03/30/2018 0744   CHOL 213 (H) 02/15/2018 0817   TRIG 66.0 03/30/2018 0744   HDL 69.70 03/30/2018 0744   HDL 62 02/15/2018 0817   CHOLHDL 3 03/30/2018 0744   VLDL 13.2 03/30/2018 0744   LDLCALC 116 (H) 03/30/2018 0744   LDLCALC 140 (H) 02/15/2018 0817   Hepatic Function Panel     Component Value Date/Time   PROT 7.1 03/30/2018 0744   PROT 7.4 03/30/2018 0744   PROT 6.9 02/15/2018 0817   ALBUMIN 4.3 03/30/2018 0744   ALBUMIN 4.3 02/15/2018 0817   AST 16 03/30/2018 0744   ALT 14 03/30/2018 0744   ALKPHOS 87 03/30/2018 0744   BILITOT 0.5 03/30/2018 0744   BILITOT 0.3 02/15/2018 0817   BILIDIR 0.1 03/30/2018 0744   IBILI 0.2 09/23/2011 0911      Component Value Date/Time   TSH 1.32 03/30/2018 0744   TSH 0.644 05/17/2017 1142   TSH 1.53 03/23/2017 0705     Ref. Range 02/15/2018 08:17  Vitamin D, 25-Hydroxy Latest Ref Range: 30.0 - 100.0 ng/mL  51.1   ASSESSMENT AND PLAN: Vitamin D deficiency - Plan: Vitamin D, Ergocalciferol, (DRISDOL) 50000 units CAPS capsule  At risk for osteoporosis  Class 1 obesity with serious comorbidity and body mass index (BMI) of 30.0 to 30.9 in adult, unspecified obesity type - beginning BMI>30  PLAN: Vitamin D Deficiency Aideen was informed that low vitamin  D levels contributes to fatigue and are associated with obesity, breast, and colon cancer. She agrees to continue to take prescription Vit D @50 ,000 IU every week #4 with no refills and will follow up for routine testing of vitamin D, at least 2-3 times per year. She was informed of the risk of over-replacement of vitamin D and agrees to not increase her dose unless she discusses this with [place] first. Agrees to follow up with our clinic as directed.   At risk for osteopenia and osteoporosis Wyvonne was given extended  (15 minutes) osteoporosis prevention counseling today. Adie is at risk for osteopenia and osteoporosis due to her vitamin D deficiency. She was encouraged to take her vitamin D and follow her higher calcium diet and increase strengthening exercise to help strengthen her bones and decrease her risk of osteopenia and osteoporosis.  Obesity Dymphna is currently in the action stage of change. As such, her goal is to continue with weight loss efforts She has agreed to follow the Category 3 plan Capri has been instructed to work up to a goal of 150 minutes of combined cardio and strengthening exercise per week for weight loss and overall health benefits. We discussed the following Behavioral Modification Strategies today: increasing lean protein intake and decreasing simple carbohydrates    Ranyia has agreed to follow up with our clinic in 3-4  weeks. She was informed of the importance of frequent follow up visits to maximize her success with intensive lifestyle modifications for her multiple health conditions.   OBESITY BEHAVIORAL  INTERVENTION VISIT  Today's visit was # 27  Starting weight: 204 lb Starting date: 05/17/17 Today's weight : 177 lb Today's date: 09/12/18 Total lbs lost to date: 27 lb    ASK: We discussed the diagnosis of obesity with Aldona Lento today and Brayton Layman agreed to give [place] permission to discuss obesity behavioral modification therapy today.  ASSESS: Arryanna has the diagnosis of obesity and her BMI today is 29.45 Damyiah is in the action stage of change   ADVISE: Reanne was educated on the multiple health risks of obesity as well as the benefit of weight loss to improve her health. She was advised of the need for long term treatment and the importance of lifestyle modifications to improve her current health and to decrease her risk of future health problems.  AGREE: Multiple dietary modification options and treatment options were discussed and  Ivalene agreed to follow the recommendations documented in the above note.  ARRANGE: Faviola was educated on the importance of frequent visits to treat obesity as outlined per CMS and USPSTF guidelines and agreed to schedule her next follow up appointment today.  I, Renee Ramus, am acting as transcriptionist for Dennard Nip, MD   I have reviewed the above documentation for accuracy and completeness, and I agree with the above. -Dennard Nip, MD

## 2018-09-27 MED FILL — VIT D2 1.25 MG (50,000 UNIT: 1.25 MG | 28 days supply | Qty: 4 | Fill #0

## 2018-09-27 MED FILL — ESCITALOPRAM 10 MG TABLET: 10 | 30 days supply | Qty: 30 | Fill #0

## 2018-10-03 ENCOUNTER — Ambulatory Visit (INDEPENDENT_AMBULATORY_CARE_PROVIDER_SITE_OTHER): Payer: 59 | Admitting: Family Medicine

## 2018-10-03 VITALS — BP 124/84 | HR 72 | Temp 98.2°F | Ht 65.0 in | Wt 178.0 lb

## 2018-10-03 DIAGNOSIS — E669 Obesity, unspecified: Secondary | ICD-10-CM | POA: Diagnosis not present

## 2018-10-03 DIAGNOSIS — E559 Vitamin D deficiency, unspecified: Secondary | ICD-10-CM

## 2018-10-03 DIAGNOSIS — F418 Other specified anxiety disorders: Secondary | ICD-10-CM

## 2018-10-03 DIAGNOSIS — Z9189 Other specified personal risk factors, not elsewhere classified: Secondary | ICD-10-CM

## 2018-10-03 DIAGNOSIS — Z6830 Body mass index (BMI) 30.0-30.9, adult: Secondary | ICD-10-CM | POA: Diagnosis not present

## 2018-10-04 ENCOUNTER — Ambulatory Visit: Payer: 59 | Admitting: Family

## 2018-10-04 MED ORDER — BUPROPION HCL 100 MG PO TABS: 100.0000 mg | ORAL_TABLET | Freq: Every day | ORAL | 0 refills | Status: DC

## 2018-10-04 MED FILL — buPROPion HCL 100 MG TABS: 100 | 30 days supply | Qty: 30 | Fill #0

## 2018-10-04 NOTE — Progress Notes (Signed)
Office: [phone]  /  Fax: [phone]   HPI:   Chief Complaint: OBESITY Yvonne [name] is here to discuss her progress with her obesity treatment plan. She is on the  follow the Category 3 plan and is following her eating plan approximately 50 % of the time. She states she is exercising by walking for 45 minutes 2 times per week. Jaeden notes increased emotional eating since stopping Wellbutrin due to tremors. She feels her cravings gave gotten out of control and notes increased temptations at work.  Her weight is 178 lb (80.7 kg) today and has had a weight gain of 1lb since her last visit. She has lost 26 lbs since starting treatment with [place].  Vitamin D deficiency Yvonne [name] has a diagnosis of vitamin D deficiency. She is currently taking vit D and denies nausea, vomiting or muscle weakness.  Depression with Anxiety Yvonne [name] is struggling with emotional eating and using food for comfort to the extent that it is negatively impacting her health. She often snacks when she is not hungry. Yvonne [name] sometimes feels she is out of control and then feels guilty that she made poor food choices. She has been working on behavior modification techniques to help reduce her emotional eating and has been somewhat successful. Her anxiety is stable with Lexapro but has increased cravings off of Wellbutrin.  She shows no sign of suicidal or homicidal ideations.  Depression screen [facility] 2/9 06/06/2018 05/17/2017 04/02/2017  Decreased Interest 0 3 0  Down, Depressed, Hopeless 0 0 0  PHQ - 2 Score 0 3 0  Altered sleeping 1 3 0  Tired, decreased energy 1 3 0  Change in appetite 0 0 1  Feeling bad or failure about yourself  0 0 0  Trouble concentrating 3 0 0  Moving slowly or fidgety/restless 0 0 0  Suicidal thoughts 0 0 0  PHQ-9 Score 5 9 1    At risk for cardiovascular disease Yvonne [name] is at a higher than average risk for cardiovascular disease due to obesity. She currently denies any chest pain.  ALLERGIES: No Known  Allergies  MEDICATIONS: Current Outpatient Medications on File Prior to Visit  Medication Sig Dispense Refill  . escitalopram (LEXAPRO) 10 MG tablet Take 1 tablet (10 mg total) by mouth daily. 30 tablet 0  . norethindrone (CAMILA) 0.35 MG tablet Take 1 tablet by mouth daily.    . polyethylene glycol powder (GLYCOLAX/MIRALAX) powder Take 17 g by mouth daily. 3350 g 0  . Vitamin D, Ergocalciferol, (DRISDOL) 50000 units CAPS capsule Take 1 capsule (50,000 Units total) by mouth every 7 (seven) days. 4 capsule 0   Current Facility-Administered Medications on File Prior to Visit  Medication Dose Route Frequency Provider Last Rate Last Dose  . 0.9 %  sodium chloride infusion  500 mL Intravenous Once Pyrtle, Lajuan Lines, MD        PAST MEDICAL HISTORY: Past Medical History:  Diagnosis Date  . Anemia   . Arthritis   . High blood pressure   . Joint pain   . Kidney stones   . Microscopic hematuria   . Vitamin D deficiency     PAST SURGICAL HISTORY: Past Surgical History:  Procedure Laterality Date  . NO PAST SURGERIES     Denies surgical history    SOCIAL HISTORY: Social History   Tobacco Use  . Smoking status: Never Smoker  . Smokeless tobacco: Never Used  Substance Use Topics  . Alcohol use: No  . Drug use: No  FAMILY HISTORY: Family History  Problem Relation Age of Onset  . Healthy Father   . Congestive Heart Failure Father   . Heart disease Father   . Diabetes Father   . Other Mother        deceased from multiple myelomas  . Cancer Mother   . Colon cancer Neg Hx   . Esophageal cancer Neg Hx   . Liver cancer Neg Hx   . Pancreatic cancer Neg Hx   . Rectal cancer Neg Hx   . Stomach cancer Neg Hx     ROS: Review of Systems  Constitutional: Negative for weight loss.  Gastrointestinal: Negative for nausea and vomiting.  Musculoskeletal:       Negative for muscle weakness  Psychiatric/Behavioral: Positive for depression. Negative for suicidal ideas.       Negative  for homicidal ideations    PHYSICAL EXAM: Blood pressure 124/84, pulse 72, temperature 98.2 F (36.8 C), temperature source Oral, height 5\' 5"  (1.651 m), weight 178 lb (80.7 kg), SpO2 97 %. Body mass index is 29.62 kg/m. Physical Exam  Constitutional: She is oriented to person, place, and time. She appears well-developed and well-nourished.  HENT:  Head: Normocephalic.  Neck: Normal range of motion.  Cardiovascular: Normal rate.  Pulmonary/Chest: Effort normal.  Musculoskeletal: Normal range of motion.  Neurological: She is alert and oriented to person, place, and time.  Skin: Skin is warm and dry.  Psychiatric: She has a normal mood and affect. Her behavior is normal.  Vitals reviewed.   RECENT LABS AND TESTS: BMET    Component Value Date/Time   NA 137 03/30/2018 0744   NA 142 02/15/2018 0817   K 4.3 03/30/2018 0744   CL 107 03/30/2018 0744   CO2 24 03/30/2018 0744   GLUCOSE 86 03/30/2018 0744   BUN 17 03/30/2018 0744   BUN 11 02/15/2018 0817   CREATININE 0.84 03/30/2018 0744   CALCIUM 9.5 03/30/2018 0744   GFRNONAA 74 02/15/2018 0817   GFRAA 85 02/15/2018 0817   Lab Results  Component Value Date   HGBA1C 5.6 02/15/2018   HGBA1C 5.7 (H) 09/13/2017   HGBA1C 6.2 (H) 05/17/2017   HGBA1C 5.8 10/12/2014   HGBA1C 5.7 09/14/2011   Lab Results  Component Value Date   INSULIN 28.8 (H) 02/15/2018   INSULIN 35.7 (H) 09/13/2017   INSULIN 27.9 (H) 05/17/2017   CBC    Component Value Date/Time   WBC 7.7 03/30/2018 0744   RBC 4.36 03/30/2018 0744   HGB 13.9 03/30/2018 0744   HGB 13.7 05/17/2017 1142   HCT 41.6 03/30/2018 0744   HCT 42.1 05/17/2017 1142   PLT 323.0 03/30/2018 0744   MCV 95.4 03/30/2018 0744   MCV 94 05/17/2017 1142   MCH 30.6 05/17/2017 1142   MCHC 33.5 03/30/2018 0744   RDW 15.0 03/30/2018 0744   RDW 14.2 05/17/2017 1142   LYMPHSABS 3.0 03/30/2018 0744   LYMPHSABS 3.9 (H) 05/17/2017 1142   MONOABS 0.6 03/30/2018 0744   EOSABS 0.1 03/30/2018  0744   EOSABS 0.1 05/17/2017 1142   BASOSABS 0.1 03/30/2018 0744   BASOSABS 0.0 05/17/2017 1142   Iron/TIBC/Ferritin/ %Sat No results found for: IRON, TIBC, FERRITIN, IRONPCTSAT Lipid Panel     Component Value Date/Time   CHOL 199 03/30/2018 0744   CHOL 213 (H) 02/15/2018 0817   TRIG 66.0 03/30/2018 0744   HDL 69.70 03/30/2018 0744   HDL 62 02/15/2018 0817   CHOLHDL 3 03/30/2018 0744   VLDL 13.2  03/30/2018 0744   LDLCALC 116 (H) 03/30/2018 0744   LDLCALC 140 (H) 02/15/2018 0817   Hepatic Function Panel     Component Value Date/Time   PROT 7.1 03/30/2018 0744   PROT 7.4 03/30/2018 0744   PROT 6.9 02/15/2018 0817   ALBUMIN 4.3 03/30/2018 0744   ALBUMIN 4.3 02/15/2018 0817   AST 16 03/30/2018 0744   ALT 14 03/30/2018 0744   ALKPHOS 87 03/30/2018 0744   BILITOT 0.5 03/30/2018 0744   BILITOT 0.3 02/15/2018 0817   BILIDIR 0.1 03/30/2018 0744   IBILI 0.2 09/23/2011 0911      Component Value Date/Time   TSH 1.32 03/30/2018 0744   TSH 0.644 05/17/2017 1142   TSH 1.53 03/23/2017 0705    Ref. Range 02/15/2018 08:17  Vitamin D, 25-Hydroxy Latest Ref Range: 30.0 - 100.0 ng/mL 51.1    ASSESSMENT AND PLAN: Vitamin D deficiency  Depression with anxiety - Plan: buPROPion (WELLBUTRIN) 100 MG tablet  At risk for heart disease  Class 1 obesity with serious comorbidity and body mass index (BMI) of 30.0 to 30.9 in adult, unspecified obesity type - Starting BMI greater then 30  PLAN: Vitamin D Deficiency Shamere was informed that low vitamin D levels contributes to fatigue and are associated with obesity, breast, and colon cancer. She agrees to continue to take prescription Vit D @50 ,000 IU every week and will follow up for routine testing of vitamin D, at least 2-3 times per year. She was informed of the risk of over-replacement of vitamin D and agrees to not increase her dose unless she discusses this with [place] first. We will repeat labs in 3 weeks. Agrees to follow up with our clinic  as directed.   Depression with Anxiety We discussed behavior modification techniques today to help [facility] deal with her emotional eating and depression. She has agreed to start Wellbutrin SR 100 mg qd #30 with no refills and agreed to follow up as directed.  Cardiovascular risk counseling Tyger was given extended (15 minutes) coronary artery disease prevention counseling today. She is 47 y.o. female and has risk factors for heart disease including obesity. We discussed intensive lifestyle modifications today with an emphasis on specific weight loss instructions and strategies. Pt was also informed of the importance of increasing exercise and decreasing saturated fats to help prevent heart disease.  Obesity Allyana is currently in the action stage of change. As such, her goal is to continue with weight loss efforts She has agreed to follow the Category 3 plan Mariya has been instructed to work up to a goal of 150 minutes of combined cardio and strengthening exercise per week for weight loss and overall health benefits. We discussed the following Behavioral Modification Strategies today: increasing lean protein intake, decreasing simple carbohydrates  and emotional eating strategies   Sherice has agreed to follow up with our clinic in 2-3 weeks. She was informed of the importance of frequent follow up visits to maximize her success with intensive lifestyle modifications for her multiple health conditions.   OBESITY BEHAVIORAL INTERVENTION VISIT  Today's visit was # 28  Starting weight: 204 lb Starting date: 05/17/17 Today's weight : 178 lb Today's date: 10/03/18 Total lbs lost to date: 26 lb    ASK: We discussed the diagnosis of obesity with Aldona Lento today and Brayton Layman agreed to give [place] permission to discuss obesity behavioral modification therapy today.  ASSESS: Galilee has the diagnosis of obesity and her BMI today is 29.62 Ulyana is in the  action stage of change    ADVISE: Earlyne was educated on the multiple health risks of obesity as well as the benefit of weight loss to improve her health. She was advised of the need for long term treatment and the importance of lifestyle modifications to improve her current health and to decrease her risk of future health problems.  AGREE: Multiple dietary modification options and treatment options were discussed and  Savahna agreed to follow the recommendations documented in the above note.  ARRANGE: Caylah was educated on the importance of frequent visits to treat obesity as outlined per CMS and USPSTF guidelines and agreed to schedule her next follow up appointment today.  I, Renee Ramus, am acting as transcriptionist for Dennard Nip, MD   I have reviewed the above documentation for accuracy and completeness, and I agree with the above. -Dennard Nip, MD

## 2018-10-05 ENCOUNTER — Encounter (INDEPENDENT_AMBULATORY_CARE_PROVIDER_SITE_OTHER): Payer: Self-pay | Admitting: Family Medicine

## 2018-10-24 ENCOUNTER — Ambulatory Visit (INDEPENDENT_AMBULATORY_CARE_PROVIDER_SITE_OTHER): Payer: 59 | Admitting: Family Medicine

## 2018-10-24 VITALS — BP 119/84 | HR 75 | Temp 98.0°F | Ht 65.0 in | Wt 175.0 lb

## 2018-10-24 DIAGNOSIS — Z9189 Other specified personal risk factors, not elsewhere classified: Secondary | ICD-10-CM

## 2018-10-24 DIAGNOSIS — E669 Obesity, unspecified: Secondary | ICD-10-CM | POA: Diagnosis not present

## 2018-10-24 DIAGNOSIS — E559 Vitamin D deficiency, unspecified: Secondary | ICD-10-CM | POA: Diagnosis not present

## 2018-10-24 DIAGNOSIS — F418 Other specified anxiety disorders: Secondary | ICD-10-CM

## 2018-10-24 DIAGNOSIS — Z6830 Body mass index (BMI) 30.0-30.9, adult: Secondary | ICD-10-CM

## 2018-10-24 MED ORDER — VITAMIN D (ERGOCALCIFEROL) 1.25 MG (50000 UNIT) PO CAPS: 50000.0000 [IU] | ORAL_CAPSULE | ORAL | 0 refills | Status: DC

## 2018-10-24 MED ORDER — ESCITALOPRAM OXALATE 10 MG PO TABS: 10.0000 mg | ORAL_TABLET | Freq: Every day | ORAL | 0 refills | Status: DC

## 2018-10-24 MED ORDER — BUPROPION HCL 100 MG PO TABS: 100.0000 mg | ORAL_TABLET | Freq: Every day | ORAL | 0 refills | Status: DC

## 2018-10-24 MED FILL — ESCITALOPRAM 10 MG TABLET: 10 | 30 days supply | Qty: 30 | Fill #0

## 2018-10-24 MED FILL — VIT D2 1.25 MG (50,000 UNIT: 1.25 MG | 28 days supply | Qty: 4 | Fill #0

## 2018-10-26 NOTE — Progress Notes (Signed)
Office: [phone]  /  Fax: [phone]   HPI:   Chief Complaint: OBESITY Yvonne [name] is here to discuss her progress with her obesity treatment plan. She is on the Category 3 plan and is following her eating plan approximately 70-80 % of the time. She states she is walking for 45 minutes 3 times per week. Yvonne [name] continues to do well with weight loss on her Category 3 plan. She is doing better with meal prepping and sleeping better.  Her weight is 175 lb (79.4 kg) today and has had a weight loss of 3 pounds over a period of 3 weeks since her last visit. She has lost 29 lbs since starting treatment with [place].  Vitamin D Deficiency Yvonne [name] has a diagnosis of vitamin D deficiency. She is stable on prescription Vit D, but level is not yet at goal. She denies nausea, vomiting or muscle weakness.  Depression with Anxiety Yvonne [name] is doing well on new dose of Wellbutrin. She denies insomnia and her blood pressure is well controlled. Yvonne [name] struggles with emotional eating and using food for comfort to the extent that it is negatively impacting her health. She often snacks when she is not hungry. Yvonne [name] sometimes feels she is out of control and then feels guilty that she made poor food choices. She has been working on behavior modification techniques to help reduce her emotional eating and has been somewhat successful. She shows no sign of suicidal or homicidal ideations.  Depression screen Yvonne [name] 2/9 06/06/2018 05/17/2017 04/02/2017  Decreased Interest 0 3 0  Down, Depressed, Hopeless 0 0 0  PHQ - 2 Score 0 3 0  Altered sleeping 1 3 0  Tired, decreased energy 1 3 0  Change in appetite 0 0 1  Feeling bad or failure about yourself  0 0 0  Trouble concentrating 3 0 0  Moving slowly or fidgety/restless 0 0 0  Suicidal thoughts 0 0 0  PHQ-9 Score 5 9 1    At risk for cardiovascular disease Yvonne [name] is at a higher than average risk for cardiovascular disease due to obesity. She currently denies any chest  pain.  ALLERGIES: No Known Allergies  MEDICATIONS: Current Outpatient Medications on File Prior to Visit  Medication Sig Dispense Refill  . norethindrone (CAMILA) 0.35 MG tablet Take 1 tablet by mouth daily.    . polyethylene glycol powder (GLYCOLAX/MIRALAX) powder Take 17 g by mouth daily. 3350 g 0   Current Facility-Administered Medications on File Prior to Visit  Medication Dose Route Frequency Provider Last Rate Last Dose  . 0.9 %  sodium chloride infusion  500 mL Intravenous Once Pyrtle, Lajuan Lines, MD        PAST MEDICAL HISTORY: Past Medical History:  Diagnosis Date  . Anemia   . Arthritis   . High blood pressure   . Joint pain   . Kidney stones   . Microscopic hematuria   . Vitamin D deficiency     PAST SURGICAL HISTORY: Past Surgical History:  Procedure Laterality Date  . NO PAST SURGERIES     Denies surgical history    SOCIAL HISTORY: Social History   Tobacco Use  . Smoking status: Never Smoker  . Smokeless tobacco: Never Used  Substance Use Topics  . Alcohol use: No  . Drug use: No    FAMILY HISTORY: Family History  Problem Relation Age of Onset  . Healthy Father   . Congestive Heart Failure Father   . Heart disease Father   . Diabetes Father   .  Other Mother        deceased from multiple myelomas  . Cancer Mother   . Colon cancer Neg Hx   . Esophageal cancer Neg Hx   . Liver cancer Neg Hx   . Pancreatic cancer Neg Hx   . Rectal cancer Neg Hx   . Stomach cancer Neg Hx     ROS: Review of Systems  Constitutional: Positive for weight loss.  Cardiovascular: Negative for chest pain.  Gastrointestinal: Negative for nausea and vomiting.  Musculoskeletal:       Negative muscle weakness  Psychiatric/Behavioral: Positive for depression. Negative for suicidal ideas. The patient does not have insomnia.        + Anxiety    PHYSICAL EXAM: Blood pressure 119/84, pulse 75, temperature 98 F (36.7 C), temperature source Oral, height 5\' 5"  (1.651 m),  weight 175 lb (79.4 kg), SpO2 96 %. Body mass index is 29.12 kg/m. Physical Exam  Constitutional: She is oriented to person, place, and time. She appears well-developed and well-nourished.  Cardiovascular: Normal rate.  Pulmonary/Chest: Effort normal.  Musculoskeletal: Normal range of motion.  Neurological: She is oriented to person, place, and time.  Skin: Skin is warm and dry.  Psychiatric: She has a normal mood and affect. Her behavior is normal.  Vitals reviewed.   RECENT LABS AND TESTS: BMET    Component Value Date/Time   NA 137 03/30/2018 0744   NA 142 02/15/2018 0817   K 4.3 03/30/2018 0744   CL 107 03/30/2018 0744   CO2 24 03/30/2018 0744   GLUCOSE 86 03/30/2018 0744   BUN 17 03/30/2018 0744   BUN 11 02/15/2018 0817   CREATININE 0.84 03/30/2018 0744   CALCIUM 9.5 03/30/2018 0744   GFRNONAA 74 02/15/2018 0817   GFRAA 85 02/15/2018 0817   Lab Results  Component Value Date   HGBA1C 5.6 02/15/2018   HGBA1C 5.7 (H) 09/13/2017   HGBA1C 6.2 (H) 05/17/2017   HGBA1C 5.8 10/12/2014   HGBA1C 5.7 09/14/2011   Lab Results  Component Value Date   INSULIN 28.8 (H) 02/15/2018   INSULIN 35.7 (H) 09/13/2017   INSULIN 27.9 (H) 05/17/2017   CBC    Component Value Date/Time   WBC 7.7 03/30/2018 0744   RBC 4.36 03/30/2018 0744   HGB 13.9 03/30/2018 0744   HGB 13.7 05/17/2017 1142   HCT 41.6 03/30/2018 0744   HCT 42.1 05/17/2017 1142   PLT 323.0 03/30/2018 0744   MCV 95.4 03/30/2018 0744   MCV 94 05/17/2017 1142   MCH 30.6 05/17/2017 1142   MCHC 33.5 03/30/2018 0744   RDW 15.0 03/30/2018 0744   RDW 14.2 05/17/2017 1142   LYMPHSABS 3.0 03/30/2018 0744   LYMPHSABS 3.9 (H) 05/17/2017 1142   MONOABS 0.6 03/30/2018 0744   EOSABS 0.1 03/30/2018 0744   EOSABS 0.1 05/17/2017 1142   BASOSABS 0.1 03/30/2018 0744   BASOSABS 0.0 05/17/2017 1142   Iron/TIBC/Ferritin/ %Sat No results found for: IRON, TIBC, FERRITIN, IRONPCTSAT Lipid Panel     Component Value Date/Time    CHOL 199 03/30/2018 0744   CHOL 213 (H) 02/15/2018 0817   TRIG 66.0 03/30/2018 0744   HDL 69.70 03/30/2018 0744   HDL 62 02/15/2018 0817   CHOLHDL 3 03/30/2018 0744   VLDL 13.2 03/30/2018 0744   LDLCALC 116 (H) 03/30/2018 0744   LDLCALC 140 (H) 02/15/2018 0817   Hepatic Function Panel     Component Value Date/Time   PROT 7.1 03/30/2018 0744   PROT 7.4  03/30/2018 0744   PROT 6.9 02/15/2018 0817   ALBUMIN 4.3 03/30/2018 0744   ALBUMIN 4.3 02/15/2018 0817   AST 16 03/30/2018 0744   ALT 14 03/30/2018 0744   ALKPHOS 87 03/30/2018 0744   BILITOT 0.5 03/30/2018 0744   BILITOT 0.3 02/15/2018 0817   BILIDIR 0.1 03/30/2018 0744   IBILI 0.2 09/23/2011 0911      Component Value Date/Time   TSH 1.32 03/30/2018 0744   TSH 0.644 05/17/2017 1142   TSH 1.53 03/23/2017 0705  Results for Yvonne [name], Yvonne [name] (MRN [id number]) as of 10/26/2018 12:46  Ref. Range 02/15/2018 08:17  Vitamin D, 25-Hydroxy Latest Ref Range: 30.0 - 100.0 ng/mL 51.1    ASSESSMENT AND PLAN: Vitamin D deficiency - Plan: Vitamin D, Ergocalciferol, (DRISDOL) 1.25 MG (50000 UT) CAPS capsule  Depression with anxiety - Plan: buPROPion (WELLBUTRIN) 100 MG tablet  At risk for heart disease  Class 1 obesity with serious comorbidity and body mass index (BMI) of 30.0 to 30.9 in adult, unspecified obesity type - starting BMI >30  Other depression - with emotional eating - Plan: escitalopram (LEXAPRO) 10 MG tablet  PLAN:  Vitamin D Deficiency Yvonne [name] was informed that low vitamin D levels contributes to fatigue and are associated with obesity, breast, and colon cancer. Katrena agrees to continue taking prescription Vit D @50 ,000 IU every week #4 and we will refill for 1 month. She will follow up for routine testing of vitamin D, at least 2-3 times per year. She was informed of the risk of over-replacement of vitamin D and agrees to not increase her dose unless she discusses this with [place] first. Yvonne [name] agrees to follow up with our  clinic in 3 weeks.  Depression with Anxiety We discussed behavior modification techniques today to help Yvonne [name] deal with her emotional eating and depression. Yvonne [name] agrees to continue taking Wellbutrin SR 100 mg qd #30 and we will refill for 1 month, and she agrees to continue taking Lexapro 10 mg qd #30 and we will refill for 1 month. Yvonne [name] agrees to follow up with our clinic in 3 weeks.  Cardiovascular risk counselling Yvonne [name] was given extended (15 minutes) coronary artery disease prevention counseling today. She is 47 y.o. female and has risk factors for heart disease including obesity. We discussed intensive lifestyle modifications today with an emphasis on specific weight loss instructions and strategies. Pt was also informed of the importance of increasing exercise and decreasing saturated fats to help prevent heart disease.  Obesity Yvonne [name] is currently in the action stage of change. As such, her goal is to continue with weight loss efforts She has agreed to follow the Category 3 plan Yvonne [name] has been instructed to work up to a goal of 150 minutes of combined cardio and strengthening exercise per week for weight loss and overall health benefits. We discussed the following Behavioral Modification Strategies today: increasing lean protein intake, decreasing simple carbohydrates, and celebration eating strategies   Yvonne [name] has agreed to follow up with our clinic in 3 weeks. She was informed of the importance of frequent follow up visits to maximize her success with intensive lifestyle modifications for her multiple health conditions.   OBESITY BEHAVIORAL INTERVENTION VISIT  Today's visit was # 29   Starting weight: 204 lbs Starting date: 05/17/17 Today's weight : 175 lbs Today's date: 10/24/2018 Total lbs lost to date: 25    ASK: We discussed the diagnosis of obesity with Yvonne [name] today and Yvonne [name] agreed to give [place] permission to discuss  obesity behavioral modification therapy  today.  ASSESS: Yvonne [name] has the diagnosis of obesity and her BMI today is 29.12 Yvonne [name] is in the action stage of change   ADVISE: Yvonne [name] was educated on the multiple health risks of obesity as well as the benefit of weight loss to improve her health. She was advised of the need for long term treatment and the importance of lifestyle modifications to improve her current health and to decrease her risk of future health problems.  AGREE: Multiple dietary modification options and treatment options were discussed and  Bonni agreed to follow the recommendations documented in the above note.  ARRANGE: Milea was educated on the importance of frequent visits to treat obesity as outlined per CMS and USPSTF guidelines and agreed to schedule her next follow up appointment today.  I, Trixie Dredge, am acting as transcriptionist for Dennard Nip, MD  I have reviewed the above documentation for accuracy and completeness, and I agree with the above. -Dennard Nip, MD

## 2018-10-28 MED FILL — buPROPion HCL 100 MG TABS: 100 | 30 days supply | Qty: 30 | Fill #0

## 2018-11-07 MED FILL — NORETHINDRONE 0.35 MG TAB: 0.35 | 84 days supply | Qty: 84 | Fill #1

## 2018-11-14 ENCOUNTER — Ambulatory Visit (INDEPENDENT_AMBULATORY_CARE_PROVIDER_SITE_OTHER): Payer: 59 | Admitting: Family Medicine

## 2018-11-14 ENCOUNTER — Encounter (INDEPENDENT_AMBULATORY_CARE_PROVIDER_SITE_OTHER): Payer: Self-pay | Admitting: Family Medicine

## 2018-11-14 VITALS — BP 119/86 | HR 75 | Temp 98.1°F | Ht 65.0 in | Wt 174.0 lb

## 2018-11-14 DIAGNOSIS — Z9189 Other specified personal risk factors, not elsewhere classified: Secondary | ICD-10-CM

## 2018-11-14 DIAGNOSIS — E559 Vitamin D deficiency, unspecified: Secondary | ICD-10-CM

## 2018-11-14 DIAGNOSIS — E669 Obesity, unspecified: Secondary | ICD-10-CM

## 2018-11-14 DIAGNOSIS — F418 Other specified anxiety disorders: Secondary | ICD-10-CM | POA: Diagnosis not present

## 2018-11-14 DIAGNOSIS — E66811 Obesity, class 1: Secondary | ICD-10-CM

## 2018-11-14 DIAGNOSIS — Z6830 Body mass index (BMI) 30.0-30.9, adult: Secondary | ICD-10-CM | POA: Diagnosis not present

## 2018-11-14 MED ORDER — BUPROPION HCL 100 MG PO TABS: 100.0000 mg | ORAL_TABLET | Freq: Every day | ORAL | 0 refills | Status: DC

## 2018-11-14 MED ORDER — ESCITALOPRAM OXALATE 10 MG PO TABS: 10.0000 mg | ORAL_TABLET | Freq: Every day | ORAL | 0 refills | Status: DC

## 2018-11-14 MED ORDER — VITAMIN D (ERGOCALCIFEROL) 1.25 MG (50000 UNIT) PO CAPS: 50000.0000 [IU] | ORAL_CAPSULE | ORAL | 0 refills | Status: DC

## 2018-11-15 NOTE — Progress Notes (Signed)
Office: [phone]  /  Fax: [phone]   HPI:   Chief Complaint: OBESITY Ralene is here to discuss her progress with her obesity treatment plan. She is on the Category 3 plan and is following her eating plan approximately 80 % of the time. She states she is walking 45 minutes 5 times per week. Mckena continues to do well with weight loss on the  Category 3 plan. She notes hunger is controlled, but she still has work temptations.  Her weight is 174 lb (78.9 kg) today and has had a weight loss of 1 pounds over a period of 3 weeks since her last visit. She has lost 30 lbs since starting treatment with [place].  Vitamin D deficiency Luda has a diagnosis of vitamin D deficiency. She is currently taking vit D and is stable. She denies nausea, vomiting, or muscle weakness.  At risk for cardiovascular disease Rogue is at a higher than average risk for cardiovascular disease due to vitamin D deficiency and obesity. She currently denies any chest pain.  Depression with Anxiety Leo is struggling with emotional eating and using food for comfort to the extent that it is negatively impacting her health. She often snacks when she is not hungry. Tiwanna sometimes feels she is out of control and then feels guilty that she made poor food choices. She has been working on behavior modification techniques to help reduce her emotional eating and has been somewhat successful. She notes no change in her mild hand tremor on Wellbutrin, but it doesn't bother her much. She feels that she is doing better with emotional eating and is more patient with work and family.  ALLERGIES: No Known Allergies  MEDICATIONS: Current Outpatient Medications on File Prior to Visit  Medication Sig Dispense Refill  . norethindrone (CAMILA) 0.35 MG tablet Take 1 tablet by mouth daily.    . polyethylene glycol powder (GLYCOLAX/MIRALAX) powder Take 17 g by mouth daily. 3350 g 0   Current Facility-Administered Medications on File  Prior to Visit  Medication Dose Route Frequency Provider Last Rate Last Dose  . 0.9 %  sodium chloride infusion  500 mL Intravenous Once Pyrtle, Lajuan Lines, MD        PAST MEDICAL HISTORY: Past Medical History:  Diagnosis Date  . Anemia   . Arthritis   . High blood pressure   . Joint pain   . Kidney stones   . Microscopic hematuria   . Vitamin D deficiency     PAST SURGICAL HISTORY: Past Surgical History:  Procedure Laterality Date  . NO PAST SURGERIES     Denies surgical history    SOCIAL HISTORY: Social History   Tobacco Use  . Smoking status: Never Smoker  . Smokeless tobacco: Never Used  Substance Use Topics  . Alcohol use: No  . Drug use: No    FAMILY HISTORY: Family History  Problem Relation Age of Onset  . Healthy Father   . Congestive Heart Failure Father   . Heart disease Father   . Diabetes Father   . Other Mother        deceased from multiple myelomas  . Cancer Mother   . Colon cancer Neg Hx   . Esophageal cancer Neg Hx   . Liver cancer Neg Hx   . Pancreatic cancer Neg Hx   . Rectal cancer Neg Hx   . Stomach cancer Neg Hx     ROS: Review of Systems  Constitutional: Positive for weight loss.  Gastrointestinal: Negative  for nausea and vomiting.  Musculoskeletal:       Negative for muscle weakness.  Neurological: Positive for tremors.  Psychiatric/Behavioral: Positive for depression. The patient is nervous/anxious.     PHYSICAL EXAM: Blood pressure 119/86, pulse 75, temperature 98.1 F (36.7 C), temperature source Oral, height 5\' 5"  (1.651 m), weight 174 lb (78.9 kg), SpO2 98 %. Body mass index is 28.96 kg/m. Physical Exam  Constitutional: She is oriented to person, place, and time. She appears well-developed and well-nourished.  Cardiovascular: Normal rate.  Pulmonary/Chest: Effort normal.  Musculoskeletal: Normal range of motion.  Neurological: She is oriented to person, place, and time.  Skin: Skin is warm and dry.  Psychiatric: She has  a normal mood and affect. Her behavior is normal.  Vitals reviewed.   RECENT LABS AND TESTS: BMET    Component Value Date/Time   NA 137 03/30/2018 0744   NA 142 02/15/2018 0817   K 4.3 03/30/2018 0744   CL 107 03/30/2018 0744   CO2 24 03/30/2018 0744   GLUCOSE 86 03/30/2018 0744   BUN 17 03/30/2018 0744   BUN 11 02/15/2018 0817   CREATININE 0.84 03/30/2018 0744   CALCIUM 9.5 03/30/2018 0744   GFRNONAA 74 02/15/2018 0817   GFRAA 85 02/15/2018 0817   Lab Results  Component Value Date   HGBA1C 5.6 02/15/2018   HGBA1C 5.7 (H) 09/13/2017   HGBA1C 6.2 (H) 05/17/2017   HGBA1C 5.8 10/12/2014   HGBA1C 5.7 09/14/2011   Lab Results  Component Value Date   INSULIN 28.8 (H) 02/15/2018   INSULIN 35.7 (H) 09/13/2017   INSULIN 27.9 (H) 05/17/2017   CBC    Component Value Date/Time   WBC 7.7 03/30/2018 0744   RBC 4.36 03/30/2018 0744   HGB 13.9 03/30/2018 0744   HGB 13.7 05/17/2017 1142   HCT 41.6 03/30/2018 0744   HCT 42.1 05/17/2017 1142   PLT 323.0 03/30/2018 0744   MCV 95.4 03/30/2018 0744   MCV 94 05/17/2017 1142   MCH 30.6 05/17/2017 1142   MCHC 33.5 03/30/2018 0744   RDW 15.0 03/30/2018 0744   RDW 14.2 05/17/2017 1142   LYMPHSABS 3.0 03/30/2018 0744   LYMPHSABS 3.9 (H) 05/17/2017 1142   MONOABS 0.6 03/30/2018 0744   EOSABS 0.1 03/30/2018 0744   EOSABS 0.1 05/17/2017 1142   BASOSABS 0.1 03/30/2018 0744   BASOSABS 0.0 05/17/2017 1142   Iron/TIBC/Ferritin/ %Sat No results found for: IRON, TIBC, FERRITIN, IRONPCTSAT Lipid Panel     Component Value Date/Time   CHOL 199 03/30/2018 0744   CHOL 213 (H) 02/15/2018 0817   TRIG 66.0 03/30/2018 0744   HDL 69.70 03/30/2018 0744   HDL 62 02/15/2018 0817   CHOLHDL 3 03/30/2018 0744   VLDL 13.2 03/30/2018 0744   LDLCALC 116 (H) 03/30/2018 0744   LDLCALC 140 (H) 02/15/2018 0817   Hepatic Function Panel     Component Value Date/Time   PROT 7.1 03/30/2018 0744   PROT 7.4 03/30/2018 0744   PROT 6.9 02/15/2018 0817    ALBUMIN 4.3 03/30/2018 0744   ALBUMIN 4.3 02/15/2018 0817   AST 16 03/30/2018 0744   ALT 14 03/30/2018 0744   ALKPHOS 87 03/30/2018 0744   BILITOT 0.5 03/30/2018 0744   BILITOT 0.3 02/15/2018 0817   BILIDIR 0.1 03/30/2018 0744   IBILI 0.2 09/23/2011 0911      Component Value Date/Time   TSH 1.32 03/30/2018 0744   TSH 0.644 05/17/2017 1142   TSH 1.53 03/23/2017 0705  Results for JANNATUL, WOJDYLA (MRN [id number]) as of 11/15/2018 15:44  Ref. Range 02/15/2018 08:17  Vitamin D, 25-Hydroxy Latest Ref Range: 30.0 - 100.0 ng/mL 51.1   ASSESSMENT AND PLAN: Vitamin D deficiency - Plan: Vitamin D, Ergocalciferol, (DRISDOL) 1.25 MG (50000 UT) CAPS capsule  Depression with anxiety - Plan: escitalopram (LEXAPRO) 10 MG tablet, buPROPion (WELLBUTRIN) 100 MG tablet  At risk for heart disease  Class 1 obesity with serious comorbidity and body mass index (BMI) of 30.0 to 30.9 in adult, unspecified obesity type - Starting BMI greater then 30  PLAN:  Vitamin D deficiency Samanda has a diagnosis of vitamin D deficiency. She is currently taking vit D and denies nausea, vomiting, or muscle weakness.  Cardiovascular risk counseling Piya was given extended (15 minutes) coronary artery disease prevention counseling today. She is 47 y.o. female and has risk factors for heart disease including vitamin D deficiency and obesity. We discussed intensive lifestyle modifications today with an emphasis on specific weight loss instructions and strategies. Pt was also informed of the importance of increasing exercise and decreasing saturated fats to help prevent heart disease.  Depression with Anxiety We discussed behavior modification techniques today to help [facility] deal with her emotional eating and depression. She has agreed to take Wellbutrin SR 100mg  qd #30 with no refills and Lexapro 10mg  qd #30 with no refills. Tacoya agreed to follow up as directed in 4 weeks.  Obesity Bettejane is currently in the action  stage of change. As such, her goal is to continue with weight loss efforts. She has agreed to follow the Category 3 plan. Mckaela has been instructed to work up to a goal of 150 minutes of combined cardio and strengthening exercise per week for weight loss and overall health benefits. We discussed the following Behavioral Modification Strategies today: increasing lean protein intake, decreasing simple carbohydrates, and work on meal planning and easy cooking plans.  Kynsli has agreed to follow up with our clinic in 4 weeks. She was informed of the importance of frequent follow up visits to maximize her success with intensive lifestyle modifications for her multiple health conditions.   OBESITY BEHAVIORAL INTERVENTION VISIT  Today's visit was # 30   Starting weight: 204 lbs Starting date: 05/17/17 Today's weight : Weight: 174 lb (78.9 kg)  Today's date: 11/14/2018 Total lbs lost to date: 30  ASK: We discussed the diagnosis of obesity with Aldona Lento today and [facility] agreed to give [place] permission to discuss obesity behavioral modification therapy today.  ASSESS: Mayreli has the diagnosis of obesity and her BMI today is 28.9. Lupita is in the action stage of change.   ADVISE: Emmalina was educated on the multiple health risks of obesity as well as the benefit of weight loss to improve her health. She was advised of the need for long term treatment and the importance of lifestyle modifications to improve her current health and to decrease her risk of future health problems.  AGREE: Multiple dietary modification options and treatment options were discussed and Sephira agreed to follow the recommendations documented in the above note.  ARRANGE: Ivis was educated on the importance of frequent visits to treat obesity as outlined per CMS and USPSTF guidelines and agreed to schedule her next follow up appointment today.  I, Marcille Blanco, am acting as transcriptionist for Starlyn Skeans,  MD  I have reviewed the above documentation for accuracy and completeness, and I agree with the above. -Dennard Nip, MD

## 2018-12-02 ENCOUNTER — Other Ambulatory Visit: Payer: Self-pay | Admitting: Family Medicine

## 2018-12-02 DIAGNOSIS — Z20828 Contact with and (suspected) exposure to other viral communicable diseases: Secondary | ICD-10-CM

## 2018-12-02 MED ORDER — OSELTAMIVIR PHOSPHATE 75 MG PO CAPS: 75.0000 mg | ORAL_CAPSULE | Freq: Every day | ORAL | 0 refills | Status: AC

## 2018-12-05 MED FILL — OSELTAMIVIR PHOSPHATE 75 MG: 75 | 7 days supply | Qty: 7 | Fill #0

## 2018-12-12 ENCOUNTER — Encounter (INDEPENDENT_AMBULATORY_CARE_PROVIDER_SITE_OTHER): Payer: Self-pay | Admitting: Family Medicine

## 2018-12-12 ENCOUNTER — Ambulatory Visit (INDEPENDENT_AMBULATORY_CARE_PROVIDER_SITE_OTHER): Payer: 59 | Admitting: Family Medicine

## 2018-12-12 VITALS — BP 129/86 | HR 81 | Temp 98.6°F | Ht 65.0 in | Wt 180.0 lb

## 2018-12-12 DIAGNOSIS — E559 Vitamin D deficiency, unspecified: Secondary | ICD-10-CM | POA: Diagnosis not present

## 2018-12-12 DIAGNOSIS — R7303 Prediabetes: Secondary | ICD-10-CM | POA: Diagnosis not present

## 2018-12-12 DIAGNOSIS — Z6830 Body mass index (BMI) 30.0-30.9, adult: Secondary | ICD-10-CM | POA: Diagnosis not present

## 2018-12-12 DIAGNOSIS — E669 Obesity, unspecified: Secondary | ICD-10-CM | POA: Diagnosis not present

## 2018-12-12 DIAGNOSIS — Z9189 Other specified personal risk factors, not elsewhere classified: Secondary | ICD-10-CM | POA: Diagnosis not present

## 2018-12-12 DIAGNOSIS — E785 Hyperlipidemia, unspecified: Secondary | ICD-10-CM | POA: Diagnosis not present

## 2018-12-12 MED FILL — VIT D2 1.25 MG (50,000 UNIT: 1.25 MG | 28 days supply | Qty: 4 | Fill #0

## 2018-12-12 MED FILL — ESCITALOPRAM 10 MG TABLET: 10 | 30 days supply | Qty: 30 | Fill #0

## 2018-12-12 MED FILL — buPROPion HCL 100 MG TABS: 100 | 30 days supply | Qty: 30 | Fill #0

## 2018-12-13 NOTE — Progress Notes (Signed)
Office: [phone]  /  Fax: [phone]   HPI:   Chief Complaint: OBESITY Yvonne [name] is here to discuss her progress with her obesity treatment plan. She is on the Category 3 plan and is following her eating plan approximately 70 % of the time. She states she is walking 30 minutes 3 to 4 times per week. Yvonne [name] has been off track over the holidays while off work and sick. She is ready to get back on track and she is happy with her diet overall.  Her weight is 180 lb (81.6 kg) today and has had a weight gain of 6 pounds over a period of 4 weeks since her last visit. She has lost 24 lbs since starting treatment with [place].  Pre-Diabetes Yvonne [name] has a diagnosis of pre-diabetes based on her elevated Hgb A1c and was informed this puts her at greater risk of developing diabetes. She is stable with diet and exercise. She is not taking metformin currently and continues to work on diet and exercise to decrease risk of diabetes. Yvonne [name] is due for labs today and she denies hypoglycemia.  Vitamin D deficiency Yvonne [name] has a diagnosis of vitamin D deficiency. She is currently stable on prescription vit D and denies nausea, vomiting, or muscle weakness. She is due for labs today.  Hyperlipidemia Yvonne [name] has hyperlipidemia and has been attempting to improve her cholesterol levels with intensive lifestyle modification including a low saturated fat diet, exercise and weight loss. She denies any chest pain and she is due for labs today.  ASSESSMENT AND PLAN:  Prediabetes - Plan: Comprehensive metabolic panel, CBC with Differential/Platelet, Hemoglobin A1c, Insulin, random, Vitamin B12, T3, T4, free, TSH, Folate  Vitamin D deficiency - Plan: VITAMIN D 25 Hydroxy (Vit-D Deficiency, Fractures)  Hyperlipidemia, unspecified hyperlipidemia type - Plan: Lipid panel  Class 1 obesity with serious comorbidity and body mass index (BMI) of 30.0 to 30.9 in adult, unspecified obesity type  PLAN:  Pre-Diabetes Yvonne [name] will  continue to work on weight loss, exercise, and decreasing simple carbohydrates in her diet to help decrease the risk of diabetes. She was informed that eating too many simple carbohydrates or too many calories at one sitting increases the likelihood of GI side effects. We will order labs today. Yvonne [name] agreed to continue with her diet and will follow up with [place] as directed to monitor her progress in 2 weeks.  Vitamin D Deficiency Yvonne [name] was informed that low vitamin D levels contributes to fatigue and are associated with obesity, breast, and colon cancer. She agrees to continue to take prescription Vit D @50 ,000 IU every week and will follow up for routine testing of vitamin D, at least 2-3 times per year. She was informed of the risk of over-replacement of vitamin D and agrees to not increase her dose unless she discusses this with [place] first. We will check labs today and Yvonne [name] agrees to follow up as directed.  Hyperlipidemia Yvonne [name] was informed of the American Heart Association Guidelines emphasizing intensive lifestyle modifications as the first line treatment for hyperlipidemia. We discussed many lifestyle modifications today in depth, and Yvonne [name] will continue to work on decreasing saturated fats such as fatty red meat, butter and many fried foods. She will also increase vegetables and lean protein in her diet and continue to work on exercise and weight loss efforts. We will order labs today. Billy agrees to continue with her diet and exercise and will follow up as agreed.  At risk for cardiovascular disease Yvonne [name] is at a  higher than average risk for cardiovascular disease due to obesity. She currently denies any chest pain.  Obesity Yvonne [name] is currently in the action stage of change. As such, her goal is to continue with weight loss efforts. She has agreed to follow the Category 3 plan. Yvonne [name] has been instructed to work up to a goal of 150 minutes of combined cardio and strengthening exercise per  week for weight loss and overall health benefits. We discussed the following Behavioral Modification Strategies today: increasing lean protein intake, decreasing simple carbohydrates, work on meal planning and easy cooking plans, better snacking choices, and dealing with family or coworker sabotage.  Yvonne [name] has agreed to follow up with our clinic in 2 weeks. She was informed of the importance of frequent follow up visits to maximize her success with intensive lifestyle modifications for her multiple health conditions.  ALLERGIES: No Known Allergies  MEDICATIONS: Current Outpatient Medications on File Prior to Visit  Medication Sig Dispense Refill  . buPROPion (WELLBUTRIN) 100 MG tablet Take 1 tablet (100 mg total) by mouth daily. 30 tablet 0  . escitalopram (LEXAPRO) 10 MG tablet Take 1 tablet (10 mg total) by mouth daily. 30 tablet 0  . norethindrone (CAMILA) 0.35 MG tablet Take 1 tablet by mouth daily.    . polyethylene glycol powder (GLYCOLAX/MIRALAX) powder Take 17 g by mouth daily. 3350 g 0  . Vitamin D, Ergocalciferol, (DRISDOL) 1.25 MG (50000 UT) CAPS capsule Take 1 capsule (50,000 Units total) by mouth every 7 (seven) days. 4 capsule 0   Current Facility-Administered Medications on File Prior to Visit  Medication Dose Route Frequency Provider Last Rate Last Dose  . 0.9 %  sodium chloride infusion  500 mL Intravenous Once Pyrtle, Lajuan Lines, MD        PAST MEDICAL HISTORY: Past Medical History:  Diagnosis Date  . Anemia   . Arthritis   . High blood pressure   . Joint pain   . Kidney stones   . Microscopic hematuria   . Vitamin D deficiency     PAST SURGICAL HISTORY: Past Surgical History:  Procedure Laterality Date  . NO PAST SURGERIES     Denies surgical history    SOCIAL HISTORY: Social History   Tobacco Use  . Smoking status: Never Smoker  . Smokeless tobacco: Never Used  Substance Use Topics  . Alcohol use: No  . Drug use: No    FAMILY HISTORY: Family  History  Problem Relation Age of Onset  . Healthy Father   . Congestive Heart Failure Father   . Heart disease Father   . Diabetes Father   . Other Mother        deceased from multiple myelomas  . Cancer Mother   . Colon cancer Neg Hx   . Esophageal cancer Neg Hx   . Liver cancer Neg Hx   . Pancreatic cancer Neg Hx   . Rectal cancer Neg Hx   . Stomach cancer Neg Hx     ROS: Review of Systems  Constitutional: Negative for weight loss.  Cardiovascular: Negative for chest pain.  Gastrointestinal: Negative for nausea and vomiting.  Musculoskeletal:       Negative for muscle weakness.  Endo/Heme/Allergies:       Negative for hypoglycemia.    PHYSICAL EXAM: Blood pressure 129/86, pulse 81, temperature 98.6 F (37 C), temperature source Oral, height 5\' 5"  (1.651 m), weight 180 lb (81.6 kg), SpO2 99 %. Body mass index is 29.95 kg/m. Physical Exam Vitals  signs reviewed.  Constitutional:      Appearance: Normal appearance. She is obese.  Cardiovascular:     Rate and Rhythm: Normal rate.  Pulmonary:     Effort: Pulmonary effort is normal.  Musculoskeletal: Normal range of motion.  Skin:    General: Skin is warm and dry.  Neurological:     Mental Status: She is alert and oriented to person, place, and time.  Psychiatric:        Mood and Affect: Mood normal.        Behavior: Behavior normal.     RECENT LABS AND TESTS: BMET    Component Value Date/Time   NA 137 03/30/2018 0744   NA 142 02/15/2018 0817   K 4.3 03/30/2018 0744   CL 107 03/30/2018 0744   CO2 24 03/30/2018 0744   GLUCOSE 86 03/30/2018 0744   BUN 17 03/30/2018 0744   BUN 11 02/15/2018 0817   CREATININE 0.84 03/30/2018 0744   CALCIUM 9.5 03/30/2018 0744   GFRNONAA 74 02/15/2018 0817   GFRAA 85 02/15/2018 0817   Lab Results  Component Value Date   HGBA1C 5.6 02/15/2018   HGBA1C 5.7 (H) 09/13/2017   HGBA1C 6.2 (H) 05/17/2017   HGBA1C 5.8 10/12/2014   HGBA1C 5.7 09/14/2011   Lab Results    Component Value Date   INSULIN 28.8 (H) 02/15/2018   INSULIN 35.7 (H) 09/13/2017   INSULIN 27.9 (H) 05/17/2017   CBC    Component Value Date/Time   WBC 7.7 03/30/2018 0744   RBC 4.36 03/30/2018 0744   HGB 13.9 03/30/2018 0744   HGB 13.7 05/17/2017 1142   HCT 41.6 03/30/2018 0744   HCT 42.1 05/17/2017 1142   PLT 323.0 03/30/2018 0744   MCV 95.4 03/30/2018 0744   MCV 94 05/17/2017 1142   MCH 30.6 05/17/2017 1142   MCHC 33.5 03/30/2018 0744   RDW 15.0 03/30/2018 0744   RDW 14.2 05/17/2017 1142   LYMPHSABS 3.0 03/30/2018 0744   LYMPHSABS 3.9 (H) 05/17/2017 1142   MONOABS 0.6 03/30/2018 0744   EOSABS 0.1 03/30/2018 0744   EOSABS 0.1 05/17/2017 1142   BASOSABS 0.1 03/30/2018 0744   BASOSABS 0.0 05/17/2017 1142   Iron/TIBC/Ferritin/ %Sat No results found for: IRON, TIBC, FERRITIN, IRONPCTSAT Lipid Panel     Component Value Date/Time   CHOL 199 03/30/2018 0744   CHOL 213 (H) 02/15/2018 0817   TRIG 66.0 03/30/2018 0744   HDL 69.70 03/30/2018 0744   HDL 62 02/15/2018 0817   CHOLHDL 3 03/30/2018 0744   VLDL 13.2 03/30/2018 0744   LDLCALC 116 (H) 03/30/2018 0744   LDLCALC 140 (H) 02/15/2018 0817   Hepatic Function Panel     Component Value Date/Time   PROT 7.1 03/30/2018 0744   PROT 7.4 03/30/2018 0744   PROT 6.9 02/15/2018 0817   ALBUMIN 4.3 03/30/2018 0744   ALBUMIN 4.3 02/15/2018 0817   AST 16 03/30/2018 0744   ALT 14 03/30/2018 0744   ALKPHOS 87 03/30/2018 0744   BILITOT 0.5 03/30/2018 0744   BILITOT 0.3 02/15/2018 0817   BILIDIR 0.1 03/30/2018 0744   IBILI 0.2 09/23/2011 0911      Component Value Date/Time   TSH 1.32 03/30/2018 0744   TSH 0.644 05/17/2017 1142   TSH 1.53 03/23/2017 0705   Results for MILANNI, AYUB D (MRN [id number]) as of 12/13/2018 12:16  Ref. Range 02/15/2018 08:17  Vitamin D, 25-Hydroxy Latest Ref Range: 30.0 - 100.0 ng/mL 51.1   Cardiovascular risk counselling [facility]  was given extended (15 minutes) coronary artery disease prevention  counseling today. She is 48 y.o. female and has risk factors for heart disease including obesity. We discussed intensive lifestyle modifications today with an emphasis on specific weight loss instructions and strategies. Pt was also informed of the importance of increasing exercise and decreasing saturated fats to help prevent heart disease.   OBESITY BEHAVIORAL INTERVENTION VISIT  Today's visit was # 31   Starting weight: 204 lbs Starting date: 05/17/17 Today's weight : Weight: 180 lb (81.6 kg)  Today's date: 12/12/2018 Total lbs lost to date: 24  ASK: We discussed the diagnosis of obesity with Aldona Lento today and [facility] agreed to give [place] permission to discuss obesity behavioral modification therapy today.  ASSESS: Secret has the diagnosis of obesity and her BMI today is 29.9. Jarissa is in the action stage of change.   ADVISE: Iley was educated on the multiple health risks of obesity as well as the benefit of weight loss to improve her health. She was advised of the need for long term treatment and the importance of lifestyle modifications to improve her current health and to decrease her risk of future health problems.  AGREE: Multiple dietary modification options and treatment options were discussed and Santana agreed to follow the recommendations documented in the above note.  ARRANGE: Lakima was educated on the importance of frequent visits to treat obesity as outlined per CMS and USPSTF guidelines and agreed to schedule her next follow up appointment today.  I, Marcille Blanco, am acting as transcriptionist for Starlyn Skeans, MD I have reviewed the above documentation for accuracy and completeness, and I agree with the above. -Dennard Nip, MD

## 2018-12-26 ENCOUNTER — Ambulatory Visit (INDEPENDENT_AMBULATORY_CARE_PROVIDER_SITE_OTHER): Payer: 59 | Admitting: Family Medicine

## 2018-12-26 ENCOUNTER — Encounter (INDEPENDENT_AMBULATORY_CARE_PROVIDER_SITE_OTHER): Payer: Self-pay | Admitting: Family Medicine

## 2018-12-26 VITALS — BP 121/82 | HR 74 | Temp 98.3°F | Ht 65.0 in | Wt 179.0 lb

## 2018-12-26 DIAGNOSIS — E669 Obesity, unspecified: Secondary | ICD-10-CM

## 2018-12-26 DIAGNOSIS — E559 Vitamin D deficiency, unspecified: Secondary | ICD-10-CM | POA: Diagnosis not present

## 2018-12-26 DIAGNOSIS — Z6830 Body mass index (BMI) 30.0-30.9, adult: Secondary | ICD-10-CM | POA: Diagnosis not present

## 2018-12-26 DIAGNOSIS — R7303 Prediabetes: Secondary | ICD-10-CM | POA: Diagnosis not present

## 2018-12-26 DIAGNOSIS — Z9189 Other specified personal risk factors, not elsewhere classified: Secondary | ICD-10-CM

## 2018-12-26 DIAGNOSIS — E785 Hyperlipidemia, unspecified: Secondary | ICD-10-CM | POA: Diagnosis not present

## 2018-12-26 DIAGNOSIS — F418 Other specified anxiety disorders: Secondary | ICD-10-CM | POA: Diagnosis not present

## 2018-12-26 MED ORDER — ESCITALOPRAM OXALATE 10 MG PO TABS: 10.0000 mg | ORAL_TABLET | Freq: Every day | ORAL | 0 refills | Status: DC

## 2018-12-26 MED ORDER — VITAMIN D (ERGOCALCIFEROL) 1.25 MG (50000 UNIT) PO CAPS: 50000.0000 [IU] | ORAL_CAPSULE | ORAL | 0 refills | Status: DC

## 2018-12-26 MED ORDER — BUPROPION HCL 100 MG PO TABS: 100.0000 mg | ORAL_TABLET | Freq: Every day | ORAL | 0 refills | Status: DC

## 2018-12-27 LAB — T3: T3, Total: 86 ng/dL (ref 71–180)

## 2018-12-27 LAB — COMPREHENSIVE METABOLIC PANEL
ALBUMIN: 4.7 g/dL (ref 3.8–4.8)
ALK PHOS: 100 IU/L (ref 39–117)
ALT: 14 IU/L (ref 0–32)
AST: 12 IU/L (ref 0–40)
Albumin/Globulin Ratio: 2 (ref 1.2–2.2)
BILIRUBIN TOTAL: 0.5 mg/dL (ref 0.0–1.2)
BUN / CREAT RATIO: 18 (ref 9–23)
BUN: 15 mg/dL (ref 6–24)
CHLORIDE: 102 mmol/L (ref 96–106)
CO2: 19 mmol/L — AB (ref 20–29)
CREATININE: 0.85 mg/dL (ref 0.57–1.00)
Calcium: 9.5 mg/dL (ref 8.7–10.2)
GFR calc Af Amer: 95 mL/min/{1.73_m2} (ref >59)
GFR calc non Af Amer: 82 mL/min/{1.73_m2} (ref >59)
GLUCOSE: 83 mg/dL (ref 65–99)
Globulin, Total: 2.3 g/dL (ref 1.5–4.5)
Potassium: 4.5 mmol/L (ref 3.5–5.2)
Sodium: 138 mmol/L (ref 134–144)
Total Protein: 7 g/dL (ref 6.0–8.5)

## 2018-12-27 LAB — CBC WITH DIFFERENTIAL/PLATELET
BASOS ABS: 0.1 10*3/uL (ref 0.0–0.2)
Basos: 1 %
EOS (ABSOLUTE): 0.1 10*3/uL (ref 0.0–0.4)
EOS: 2 %
HEMATOCRIT: 41.7 % (ref 34.0–46.6)
HEMOGLOBIN: 14.3 g/dL (ref 11.1–15.9)
IMMATURE GRANS (ABS): 0 10*3/uL (ref 0.0–0.1)
IMMATURE GRANULOCYTES: 0 %
LYMPHS: 40 %
Lymphocytes Absolute: 2.7 10*3/uL (ref 0.7–3.1)
MCH: 31 pg (ref 26.6–33.0)
MCHC: 34.3 g/dL (ref 31.5–35.7)
MCV: 91 fL (ref 79–97)
MONOCYTES: 7 %
Monocytes Absolute: 0.5 10*3/uL (ref 0.1–0.9)
NEUTROS PCT: 50 %
Neutrophils Absolute: 3.4 10*3/uL (ref 1.4–7.0)
Platelets: 340 10*3/uL (ref 150–450)
RBC: 4.61 x10E6/uL (ref 3.77–5.28)
RDW: 13.2 % (ref 11.7–15.4)
WBC: 6.8 10*3/uL (ref 3.4–10.8)

## 2018-12-27 LAB — LIPID PANEL
Chol/HDL Ratio: 3.2 ratio (ref 0.0–4.4)
Cholesterol, Total: 238 mg/dL — ABNORMAL HIGH (ref 100–199)
HDL: 74 mg/dL (ref >39)
LDL Calculated: 151 mg/dL — ABNORMAL HIGH (ref 0–99)
TRIGLYCERIDES: 63 mg/dL (ref 0–149)
VLDL Cholesterol Cal: 13 mg/dL (ref 5–40)

## 2018-12-27 LAB — T4, FREE: Free T4: 1.06 ng/dL (ref 0.82–1.77)

## 2018-12-27 LAB — TSH: TSH: 1.07 u[IU]/mL (ref 0.450–4.500)

## 2018-12-27 LAB — FOLATE: Folate: 6.7 ng/mL (ref >3.0)

## 2018-12-27 LAB — VITAMIN B12: Vitamin B-12: 251 pg/mL (ref 232–1245)

## 2018-12-27 LAB — INSULIN, RANDOM: INSULIN: 9.3 u[IU]/mL (ref 2.6–24.9)

## 2018-12-27 LAB — HEMOGLOBIN A1C
Est. average glucose Bld gHb Est-mCnc: 123 mg/dL
Hgb A1c MFr Bld: 5.9 % — ABNORMAL HIGH (ref 4.8–5.6)

## 2018-12-27 LAB — VITAMIN D 25 HYDROXY (VIT D DEFICIENCY, FRACTURES): Vit D, 25-Hydroxy: 35.9 ng/mL (ref 30.0–100.0)

## 2018-12-27 NOTE — Progress Notes (Signed)
Office: [phone]  /  Fax: [phone]   HPI:   Chief Complaint: OBESITY Yvonne [name] is here to discuss her progress with her obesity treatment plan. She is on the Category 3 plan and is following her eating plan approximately 60 % of the time. She states she is walking for 30 minutes 3 times per week. Yvonne [name] has done well with getting back on track with increased temptations. She has started to plan better and has questions about different appliances.  Her weight is 179 lb (81.2 kg) today and has had a weight loss of 1 pound over a period of 2 weeks since her last visit. She has lost 25 lbs since starting treatment with [place].  Vitamin D Deficiency Yvonne [name] has a diagnosis of vitamin D deficiency. She is stable on prescription Vit D and she had her labs done this morning. She denies nausea, vomiting or muscle weakness.  At risk for osteopenia and osteoporosis Yvonne [name] is at higher risk of osteopenia and osteoporosis due to vitamin D deficiency.   Depression with Anxiety Yvonne [name] 's mood is stable on medications and her blood pressure is controlled. She denies insomnia and she is working on Biomedical engineer eating. Yvonne [name] struggles with emotional eating and using food for comfort to the extent that it is negatively impacting her health. She often snacks when she is not hungry. Yvonne [name] sometimes feels she is out of control and then feels guilty that she made poor food choices. She has been working on behavior modification techniques to help reduce her emotional eating and has been somewhat successful. She shows no sign of suicidal or homicidal ideations.  Depression screen Yvonne [name] 2/9 06/06/2018 05/17/2017 04/02/2017  Decreased Interest 0 3 0  Down, Depressed, Hopeless 0 0 0  PHQ - 2 Score 0 3 0  Altered sleeping 1 3 0  Tired, decreased energy 1 3 0  Change in appetite 0 0 1  Feeling bad or failure about yourself  0 0 0  Trouble concentrating 3 0 0  Moving slowly or fidgety/restless 0 0 0  Suicidal  thoughts 0 0 0  PHQ-9 Score 5 9 1     ASSESSMENT AND PLAN:  Vitamin D deficiency - Plan: Vitamin D, Ergocalciferol, (DRISDOL) 1.25 MG (50000 UT) CAPS capsule  Depression with anxiety - Plan: escitalopram (LEXAPRO) 10 MG tablet, buPROPion (WELLBUTRIN) 100 MG tablet  At risk for osteoporosis  Class 1 obesity with serious comorbidity and body mass index (BMI) of 30.0 to 30.9 in adult, unspecified obesity type - Starting BMI greater then 30  PLAN:  Vitamin D Deficiency Yvonne [name] was informed that low vitamin D levels contributes to fatigue and are associated with obesity, breast, and colon cancer. Aishani agrees to continue taking prescription Vit D @50 ,000 IU every week #4 and we will refill for 1 month. She will follow up for routine testing of vitamin D, at least 2-3 times per year. She was informed of the risk of over-replacement of vitamin D and agrees to not increase her dose unless she discusses this with [place] first. Sarena agrees to follow up with our clinic in 2 to 3 weeks.  At risk for osteopenia and osteoporosis Yvonne [name] was given extended (15 minutes) osteoporosis prevention counseling today. Yvonne [name] is at risk for osteopenia and osteoporsis due to her vitamin D deficiency. She was encouraged to take her vitamin D and follow her higher calcium diet and increase strengthening exercise to help strengthen her bones and decrease her risk of osteopenia and osteoporosis.  Depression  with Emotional Eating Behaviors We discussed behavior modification techniques today to help Yvonne [name] deal with her emotional eating, anxiety, and depression. Yvonne [name] agrees to continue taking Lexapro 10 mg qd #30 and we will refill for 1 month, and she agrees to continue taking Wellbutrin SR 100 mg qd #30 and we will refill for 1 month. Yvonne [name] agrees to follow up with our clinic in 2 to 3 weeks.  Obesity Yvonne [name] is currently in the action stage of change. As such, her goal is to continue with weight loss efforts She has  agreed to follow the Category 3 plan Yvonne [name] has been instructed to work up to a goal of 150 minutes of combined cardio and strengthening exercise per week for weight loss and overall health benefits. We discussed the following Behavioral Modification Strategies today: increasing lean protein intake, decreasing simple carbohydrates, work on meal planning and easy cooking plans, and better snacking choices   Yvonne [name] has agreed to follow up with our clinic in 2 to 3 weeks. She was informed of the importance of frequent follow up visits to maximize her success with intensive lifestyle modifications for her multiple health conditions.  ALLERGIES: No Known Allergies  MEDICATIONS: Current Outpatient Medications on File Prior to Visit  Medication Sig Dispense Refill  . norethindrone (CAMILA) 0.35 MG tablet Take 1 tablet by mouth daily.    . polyethylene glycol powder (GLYCOLAX/MIRALAX) powder Take 17 g by mouth daily. 3350 g 0   Current Facility-Administered Medications on File Prior to Visit  Medication Dose Route Frequency Provider Last Rate Last Dose  . 0.9 %  sodium chloride infusion  500 mL Intravenous Once Yvonne [name], Yvonne Lines, MD        PAST MEDICAL HISTORY: Past Medical History:  Diagnosis Date  . Anemia   . Arthritis   . High blood pressure   . Joint pain   . Kidney stones   . Microscopic hematuria   . Vitamin D deficiency     PAST SURGICAL HISTORY: Past Surgical History:  Procedure Laterality Date  . NO PAST SURGERIES     Denies surgical history    SOCIAL HISTORY: Social History   Tobacco Use  . Smoking status: Never Smoker  . Smokeless tobacco: Never Used  Substance Use Topics  . Alcohol use: No  . Drug use: No    FAMILY HISTORY: Family History  Problem Relation Age of Onset  . Healthy Father   . Congestive Heart Failure Father   . Heart disease Father   . Diabetes Father   . Other Mother        deceased from multiple myelomas  . Cancer Mother   . Colon cancer  Neg Hx   . Esophageal cancer Neg Hx   . Liver cancer Neg Hx   . Pancreatic cancer Neg Hx   . Rectal cancer Neg Hx   . Stomach cancer Neg Hx     ROS: Review of Systems  Constitutional: Positive for weight loss.  Gastrointestinal: Negative for nausea and vomiting.  Musculoskeletal:       Negative muscle weakness  Psychiatric/Behavioral: Positive for depression. Negative for suicidal ideas. The patient does not have insomnia.        + Anxiety    PHYSICAL EXAM: Blood pressure 121/82, pulse 74, temperature 98.3 F (36.8 C), temperature source Oral, height 5\' 5"  (1.651 m), weight 179 lb (81.2 kg), SpO2 99 %. Body mass index is 29.79 kg/m. Physical Exam Vitals signs reviewed.  Constitutional:  Appearance: Normal appearance. She is obese.  Cardiovascular:     Rate and Rhythm: Normal rate.     Pulses: Normal pulses.  Pulmonary:     Effort: Pulmonary effort is normal.     Breath sounds: Normal breath sounds.  Musculoskeletal: Normal range of motion.  Skin:    General: Skin is warm and dry.  Neurological:     Mental Status: She is alert and oriented to person, place, and time.  Psychiatric:        Mood and Affect: Mood normal.        Behavior: Behavior normal.     RECENT LABS AND TESTS: BMET    Component Value Date/Time   NA 138 12/26/2018 1219   K 4.5 12/26/2018 1219   CL 102 12/26/2018 1219   CO2 19 (L) 12/26/2018 1219   GLUCOSE 83 12/26/2018 1219   GLUCOSE 86 03/30/2018 0744   BUN 15 12/26/2018 1219   CREATININE 0.85 12/26/2018 1219   CALCIUM 9.5 12/26/2018 1219   GFRNONAA 82 12/26/2018 1219   GFRAA 95 12/26/2018 1219   Lab Results  Component Value Date   HGBA1C 5.9 (H) 12/26/2018   HGBA1C 5.6 02/15/2018   HGBA1C 5.7 (H) 09/13/2017   HGBA1C 6.2 (H) 05/17/2017   HGBA1C 5.8 10/12/2014   Lab Results  Component Value Date   INSULIN 9.3 12/26/2018   INSULIN 28.8 (H) 02/15/2018   INSULIN 35.7 (H) 09/13/2017   INSULIN 27.9 (H) 05/17/2017   CBC      Component Value Date/Time   WBC 6.8 12/26/2018 1219   WBC 7.7 03/30/2018 0744   RBC 4.61 12/26/2018 1219   RBC 4.36 03/30/2018 0744   HGB 14.3 12/26/2018 1219   HCT 41.7 12/26/2018 1219   PLT 340 12/26/2018 1219   MCV 91 12/26/2018 1219   MCH 31.0 12/26/2018 1219   MCHC 34.3 12/26/2018 1219   MCHC 33.5 03/30/2018 0744   RDW 13.2 12/26/2018 1219   LYMPHSABS 2.7 12/26/2018 1219   MONOABS 0.6 03/30/2018 0744   EOSABS 0.1 12/26/2018 1219   BASOSABS 0.1 12/26/2018 1219   Iron/TIBC/Ferritin/ %Sat No results found for: IRON, TIBC, FERRITIN, IRONPCTSAT Lipid Panel     Component Value Date/Time   CHOL 238 (H) 12/26/2018 1219   TRIG 63 12/26/2018 1219   HDL 74 12/26/2018 1219   CHOLHDL 3.2 12/26/2018 1219   CHOLHDL 3 03/30/2018 0744   VLDL 13.2 03/30/2018 0744   LDLCALC 151 (H) 12/26/2018 1219   Hepatic Function Panel     Component Value Date/Time   PROT 7.0 12/26/2018 1219   ALBUMIN 4.7 12/26/2018 1219   AST 12 12/26/2018 1219   ALT 14 12/26/2018 1219   ALKPHOS 100 12/26/2018 1219   BILITOT 0.5 12/26/2018 1219   BILIDIR 0.1 03/30/2018 0744   IBILI 0.2 09/23/2011 0911      Component Value Date/Time   TSH 1.070 12/26/2018 1219   TSH 1.32 03/30/2018 0744   TSH 0.644 05/17/2017 1142      OBESITY BEHAVIORAL INTERVENTION VISIT  Today's visit was # 30   Starting weight: 204 lbs Starting date: 05/17/17 Today's weight : 179 lbs  Today's date: 12/26/2018 Total lbs lost to date: 25    ASK: We discussed the diagnosis of obesity with Aldona Lento today and Brayton Layman agreed to give [place] permission to discuss obesity behavioral modification therapy today.  ASSESS: Mele has the diagnosis of obesity and her BMI today is 29.79 Giavana is in the action stage of change  ADVISE: Madelline was educated on the multiple health risks of obesity as well as the benefit of weight loss to improve her health. She was advised of the need for long term treatment and the importance of  lifestyle modifications to improve her current health and to decrease her risk of future health problems.  AGREE: Multiple dietary modification options and treatment options were discussed and  Naylea agreed to follow the recommendations documented in the above note.  ARRANGE: Loella was educated on the importance of frequent visits to treat obesity as outlined per CMS and USPSTF guidelines and agreed to schedule her next follow up appointment today.  I, Trixie Dredge, am acting as transcriptionist for Dennard Nip, MD  I have reviewed the above documentation for accuracy and completeness, and I agree with the above. -Dennard Nip, MD

## 2019-01-06 ENCOUNTER — Telehealth: Payer: 59 | Admitting: Family

## 2019-01-06 DIAGNOSIS — J111 Influenza due to unidentified influenza virus with other respiratory manifestations: Secondary | ICD-10-CM

## 2019-01-06 DIAGNOSIS — R6889 Other general symptoms and signs: Secondary | ICD-10-CM

## 2019-01-06 MED ORDER — OSELTAMIVIR PHOSPHATE 75 MG PO CAPS: 75.0000 mg | ORAL_CAPSULE | Freq: Two times a day (BID) | ORAL | 0 refills | Status: DC

## 2019-01-06 MED FILL — VIT D2 1.25 MG (50,000 UNIT: 1.25 MG | 28 days supply | Qty: 4 | Fill #0

## 2019-01-06 MED FILL — OSELTAMIVIR PHOSPHATE 75 MG: 75 | 5 days supply | Qty: 10 | Fill #0

## 2019-01-06 MED FILL — buPROPion HCL 100 MG TABS: 100 | 30 days supply | Qty: 30 | Fill #0

## 2019-01-06 MED FILL — ESCITALOPRAM 10 MG TABLET: 10 | 30 days supply | Qty: 30 | Fill #0

## 2019-01-06 NOTE — Progress Notes (Signed)

## 2019-01-16 ENCOUNTER — Encounter (INDEPENDENT_AMBULATORY_CARE_PROVIDER_SITE_OTHER): Payer: Self-pay | Admitting: Family Medicine

## 2019-01-16 ENCOUNTER — Ambulatory Visit (INDEPENDENT_AMBULATORY_CARE_PROVIDER_SITE_OTHER): Payer: 59 | Admitting: Family Medicine

## 2019-01-16 VITALS — BP 119/84 | HR 99 | Ht 65.0 in | Wt 180.0 lb

## 2019-01-16 DIAGNOSIS — Z9189 Other specified personal risk factors, not elsewhere classified: Secondary | ICD-10-CM | POA: Diagnosis not present

## 2019-01-16 DIAGNOSIS — R7303 Prediabetes: Secondary | ICD-10-CM

## 2019-01-16 DIAGNOSIS — E559 Vitamin D deficiency, unspecified: Secondary | ICD-10-CM

## 2019-01-16 DIAGNOSIS — Z6830 Body mass index (BMI) 30.0-30.9, adult: Secondary | ICD-10-CM | POA: Diagnosis not present

## 2019-01-16 DIAGNOSIS — F418 Other specified anxiety disorders: Secondary | ICD-10-CM | POA: Diagnosis not present

## 2019-01-16 DIAGNOSIS — E669 Obesity, unspecified: Secondary | ICD-10-CM

## 2019-01-17 MED ORDER — BUPROPION HCL 100 MG PO TABS: 100.0000 mg | ORAL_TABLET | Freq: Every day | ORAL | 0 refills | Status: DC

## 2019-01-17 MED ORDER — VITAMIN D (ERGOCALCIFEROL) 1.25 MG (50000 UNIT) PO CAPS: 50000.0000 [IU] | ORAL_CAPSULE | ORAL | 0 refills | Status: DC

## 2019-01-17 MED ORDER — ESCITALOPRAM OXALATE 10 MG PO TABS: 10.0000 mg | ORAL_TABLET | Freq: Every day | ORAL | 0 refills | Status: DC

## 2019-01-17 NOTE — Progress Notes (Signed)
Office: [phone]  /  Fax: [phone]   HPI:   Chief Complaint: OBESITY Yvonne [name] is here to discuss her progress with her obesity treatment plan. She is on the Category 3 plan and is following her eating plan approximately 40 % of the time. She states she is exercising 0 minutes 0 times per week. Yvonne [name] notes she is deviating from her plan more. She notes increased emotional eating and decreased meal planning.  Her weight is 180 lb (81.6 kg) today and has gained 1 pound since her last visit. She has lost 24 lbs since starting treatment with [place].  Vitamin D Deficiency Yvonne [name] has a diagnosis of vitamin D deficiency. She is stable on prescription Vit D, but level is not yet at goal. She denies nausea, vomiting or muscle weakness.  Pre-Diabetes Yvonne [name] has a diagnosis of pre-diabetes based on her elevated Hgb A1c, and A1c is worsening. She was informed this puts her at greater risk of developing diabetes. She is attempting to diet control and she wants to avoid metformin if possible. She denies nausea or hypoglycemia.  At risk for diabetes Yvonne [name] is at higher than average risk for developing diabetes due to her obesity and pre-diabetes. She currently denies polyuria or polydipsia.  Depression with emotional eating behaviors Yvonne [name] notes increased stress recently and has had increased stress eating. She is frustrated that she is struggling with emotional eating and using food for comfort to the extent that it is negatively impacting her health. She often snacks when she is not hungry. Yvonne [name] sometimes feels she is out of control and then feels guilty that she made poor food choices. She has been working on behavior modification techniques to help reduce her emotional eating and has been somewhat successful. She shows no sign of suicidal or homicidal ideations.  Depression screen Yvonne [name]  Decreased Interest 0 3 0  Down, Depressed, Hopeless 0 0 0  PHQ - 2 Score 0 3  0  Altered sleeping 1 3 0  Tired, decreased energy 1 3 0  Change in appetite 0 0 1  Feeling bad or failure about yourself  0 0 0  Trouble concentrating 3 0 0  Moving slowly or fidgety/restless 0 0 0  Suicidal thoughts 0 0 0  PHQ-9 Score 5 9 1     ASSESSMENT AND PLAN:  Vitamin D deficiency - Plan: Vitamin D, Ergocalciferol, (DRISDOL) 1.25 MG (50000 UT) CAPS capsule  Prediabetes  Depression with anxiety - Plan: buPROPion (WELLBUTRIN) 100 MG tablet, escitalopram (LEXAPRO) 10 MG tablet  At risk for diabetes mellitus  Class 1 obesity with serious comorbidity and body mass index (BMI) of 30.0 to 30.9 in adult, unspecified obesity type - Starting BMI greater then 30  PLAN:  Vitamin D Deficiency Yvonne [name] was informed that low vitamin D levels contributes to fatigue and are associated with obesity, breast, and colon cancer. Yvonne [name] agrees to continue taking prescription Vit D @50 ,000 IU every week #4 and we will refill for 1 month. She will follow up for routine testing of vitamin D, at least 2-3 times per year. She was informed of the risk of over-replacement of vitamin D and agrees to not increase her dose unless she discusses this with [place] first. Yvonne [name] agrees to follow up with our clinic in 3 to 4 weeks.  Pre-Diabetes Yvonne [name] is to get back to diet, and will continue to work on weight loss, exercise, and decreasing simple carbohydrates in her diet to help decrease the risk of  diabetes. We dicussed metformin including benefits and risks. She was informed that eating too many simple carbohydrates or too many calories at one sitting increases the likelihood of GI side effects. Yvonne [name] declined metformin for now and a prescription was not written today. We will recheck labs in 3 months. Yvonne [name] agrees to follow up with our clinic in 3 to 4 weeks as directed to monitor her progress.  Diabetes risk counseling Yvonne [name] was given extended (15 minutes) diabetes prevention counseling today. She is 48 y.o.  female and has risk factors for diabetes including obesity and pre-diabetes. We discussed intensive lifestyle modifications today with an emphasis on weight loss as well as increasing exercise and decreasing simple carbohydrates in her diet.  Depression with Emotional Eating Behaviors We discussed cognitive behavioral therapy to help Yvonne [name] decrease emotional eating and depression. Yvonne [name] agrees to continue taking Lexapro 10 mg qd #30 and we will refill for 1 month, and she agrees to continue taking Wellbutrin SR 100 mg qd #30 and we will refill for 1 month. Yvonne [name] agrees to follow up with our clinic in 3 to 4 weeks.  Obesity Yvonne [name] is currently in the action stage of change. As such, her goal is to continue with weight loss efforts She has agreed to follow the Category 3 plan Yvonne [name] has been instructed to work up to a goal of 150 minutes of combined cardio and strengthening exercise per week for weight loss and overall health benefits. We discussed the following Behavioral Modification Strategies today: increasing lean protein intake, decreasing simple carbohydrates , dealing with family or coworker sabotage and emotional eating strategies   Yvonne [name] has agreed to follow up with our clinic in 3 to 4 weeks. She was informed of the importance of frequent follow up visits to maximize her success with intensive lifestyle modifications for her multiple health conditions.  ALLERGIES: No Known Allergies  MEDICATIONS: Current Outpatient Medications on File Prior to Visit  Medication Sig Dispense Refill  . buPROPion (WELLBUTRIN) 100 MG tablet Take 1 tablet (100 mg total) by mouth daily. 30 tablet 0  . escitalopram (LEXAPRO) 10 MG tablet Take 1 tablet (10 mg total) by mouth daily. 30 tablet 0  . norethindrone (CAMILA) 0.35 MG tablet Take 1 tablet by mouth daily.    . polyethylene glycol powder (GLYCOLAX/MIRALAX) powder Take 17 g by mouth daily. 3350 g 0  . Vitamin D, Ergocalciferol, (DRISDOL) 1.25 MG  (50000 UT) CAPS capsule Take 1 capsule (50,000 Units total) by mouth every 7 (seven) days. 4 capsule 0   Current Facility-Administered Medications on File Prior to Visit  Medication Dose Route Frequency Provider Last Rate Last Dose  . 0.9 %  sodium chloride infusion  500 mL Intravenous Once Pyrtle, Lajuan Lines, MD        PAST MEDICAL HISTORY: Past Medical History:  Diagnosis Date  . Anemia   . Arthritis   . High blood pressure   . Joint pain   . Kidney stones   . Microscopic hematuria   . Vitamin D deficiency     PAST SURGICAL HISTORY: Past Surgical History:  Procedure Laterality Date  . NO PAST SURGERIES     Denies surgical history    SOCIAL HISTORY: Social History   Tobacco Use  . Smoking status: Never Smoker  . Smokeless tobacco: Never Used  Substance Use Topics  . Alcohol use: No  . Drug use: No    FAMILY HISTORY: Family History  Problem Relation Age of Onset  . Healthy Father   .  Congestive Heart Failure Father   . Heart disease Father   . Diabetes Father   . Other Mother        deceased from multiple myelomas  . Cancer Mother   . Colon cancer Neg Hx   . Esophageal cancer Neg Hx   . Liver cancer Neg Hx   . Pancreatic cancer Neg Hx   . Rectal cancer Neg Hx   . Stomach cancer Neg Hx     ROS: Review of Systems  Constitutional: Negative for weight loss.  Gastrointestinal: Negative for nausea and vomiting.  Genitourinary: Negative for frequency.  Musculoskeletal:       Negative muscle weakness  Endo/Heme/Allergies: Negative for polydipsia.       Negative hypoglycemia  Psychiatric/Behavioral: Positive for depression. Negative for suicidal ideas.    PHYSICAL EXAM: Blood pressure 119/84, pulse 99, height 5\' 5"  (1.651 m), weight 180 lb (81.6 kg), SpO2 97 %. Body mass index is 29.95 kg/m. Physical Exam Vitals signs reviewed.  Constitutional:      Appearance: Normal appearance. She is obese.  Cardiovascular:     Rate and Rhythm: Normal rate.      Pulses: Normal pulses.  Pulmonary:     Effort: Pulmonary effort is normal.     Breath sounds: Normal breath sounds.  Musculoskeletal: Normal range of motion.  Skin:    General: Skin is warm and dry.  Neurological:     Mental Status: She is alert and oriented to person, place, and time.  Psychiatric:        Mood and Affect: Mood normal.        Behavior: Behavior normal.     RECENT LABS AND TESTS: BMET    Component Value Date/Time   NA 138 12/26/2018 1219   K 4.5 12/26/2018 1219   CL 102 12/26/2018 1219   CO2 19 (L) 12/26/2018 1219   GLUCOSE 83 12/26/2018 1219   GLUCOSE 86 03/30/2018 0744   BUN 15 12/26/2018 1219   CREATININE 0.85 12/26/2018 1219   CALCIUM 9.5 12/26/2018 1219   GFRNONAA 82 12/26/2018 1219   GFRAA 95 12/26/2018 1219   Lab Results  Component Value Date   HGBA1C 5.9 (H) 12/26/2018   HGBA1C 5.6 02/15/2018   HGBA1C 5.7 (H) 09/13/2017   HGBA1C 6.2 (H) 05/17/2017   HGBA1C 5.8 10/12/2014   Lab Results  Component Value Date   INSULIN 9.3 12/26/2018   INSULIN 28.8 (H) 02/15/2018   INSULIN 35.7 (H) 09/13/2017   INSULIN 27.9 (H) 05/17/2017   CBC    Component Value Date/Time   WBC 6.8 12/26/2018 1219   WBC 7.7 03/30/2018 0744   RBC 4.61 12/26/2018 1219   RBC 4.36 03/30/2018 0744   HGB 14.3 12/26/2018 1219   HCT 41.7 12/26/2018 1219   PLT 340 12/26/2018 1219   MCV 91 12/26/2018 1219   MCH 31.0 12/26/2018 1219   MCHC 34.3 12/26/2018 1219   MCHC 33.5 03/30/2018 0744   RDW 13.2 12/26/2018 1219   LYMPHSABS 2.7 12/26/2018 1219   MONOABS 0.6 03/30/2018 0744   EOSABS 0.1 12/26/2018 1219   BASOSABS 0.1 12/26/2018 1219   Iron/TIBC/Ferritin/ %Sat No results found for: IRON, TIBC, FERRITIN, IRONPCTSAT Lipid Panel     Component Value Date/Time   CHOL 238 (H) 12/26/2018 1219   TRIG 63 12/26/2018 1219   HDL 74 12/26/2018 1219   CHOLHDL 3.2 12/26/2018 1219   CHOLHDL 3 03/30/2018 0744   VLDL 13.2 03/30/2018 0744   LDLCALC 151 (H) 12/26/2018 1219  Hepatic Function Panel     Component Value Date/Time   PROT 7.0 12/26/2018 1219   ALBUMIN 4.7 12/26/2018 1219   AST 12 12/26/2018 1219   ALT 14 12/26/2018 1219   ALKPHOS 100 12/26/2018 1219   BILITOT 0.5 12/26/2018 1219   BILIDIR 0.1 03/30/2018 0744   IBILI 0.2 09/23/2011 0911      Component Value Date/Time   TSH 1.070 12/26/2018 1219   TSH 1.32 03/30/2018 0744   TSH 0.644 05/17/2017 1142      OBESITY BEHAVIORAL INTERVENTION VISIT  Today's visit was # 81   Starting weight: 204 lbs Starting date: 05/17/17 Today's weight : 180 lbs  Today's date: 01/16/2019 Total lbs lost to date: 24    ASK: We discussed the diagnosis of obesity with Aldona Lento today and Brayton Layman agreed to give [place] permission to discuss obesity behavioral modification therapy today.  ASSESS: Kealani has the diagnosis of obesity and her BMI today is 29.95 Sheryll is in the action stage of change   ADVISE: Prue was educated on the multiple health risks of obesity as well as the benefit of weight loss to improve her health. She was advised of the need for long term treatment and the importance of lifestyle modifications to improve her current health and to decrease her risk of future health problems.  AGREE: Multiple dietary modification options and treatment options were discussed and  Sanye agreed to follow the recommendations documented in the above note.  ARRANGE: Keiasia was educated on the importance of frequent visits to treat obesity as outlined per CMS and USPSTF guidelines and agreed to schedule her next follow up appointment today.  I, Trixie Dredge, am acting as transcriptionist for Dennard Nip, MD  I have reviewed the above documentation for accuracy and completeness, and I agree with the above. -Dennard Nip, MD

## 2019-02-03 MED FILL — NORETHINDRONE 0.35 MG TAB: 0.35 | 84 days supply | Qty: 84 | Fill #2

## 2019-02-03 MED FILL — ESCITALOPRAM 10 MG TABLET: 10 | 30 days supply | Qty: 30 | Fill #0

## 2019-02-03 MED FILL — buPROPion HCL 100 MG TABS: 100 | 30 days supply | Qty: 30 | Fill #0

## 2019-02-03 MED FILL — VIT D2 1.25 MG (50,000 UNIT: 1.25 MG | 28 days supply | Qty: 4 | Fill #0

## 2019-02-13 ENCOUNTER — Encounter (INDEPENDENT_AMBULATORY_CARE_PROVIDER_SITE_OTHER): Payer: Self-pay | Admitting: Family Medicine

## 2019-02-13 ENCOUNTER — Ambulatory Visit (INDEPENDENT_AMBULATORY_CARE_PROVIDER_SITE_OTHER): Payer: 59 | Admitting: Family Medicine

## 2019-02-13 VITALS — BP 125/88 | HR 92 | Ht 65.0 in | Wt 180.0 lb

## 2019-02-13 DIAGNOSIS — Z6830 Body mass index (BMI) 30.0-30.9, adult: Secondary | ICD-10-CM

## 2019-02-13 DIAGNOSIS — F418 Other specified anxiety disorders: Secondary | ICD-10-CM | POA: Diagnosis not present

## 2019-02-13 DIAGNOSIS — E669 Obesity, unspecified: Secondary | ICD-10-CM | POA: Diagnosis not present

## 2019-02-13 DIAGNOSIS — Z9189 Other specified personal risk factors, not elsewhere classified: Secondary | ICD-10-CM | POA: Diagnosis not present

## 2019-02-13 DIAGNOSIS — E559 Vitamin D deficiency, unspecified: Secondary | ICD-10-CM

## 2019-02-14 MED ORDER — ESCITALOPRAM OXALATE 10 MG PO TABS: 10.0000 mg | ORAL_TABLET | Freq: Every day | ORAL | 0 refills | Status: DC

## 2019-02-14 MED ORDER — BUPROPION HCL 100 MG PO TABS: 100.0000 mg | ORAL_TABLET | Freq: Every day | ORAL | 0 refills | Status: DC

## 2019-02-14 MED ORDER — VITAMIN D (ERGOCALCIFEROL) 1.25 MG (50000 UNIT) PO CAPS: 50000.0000 [IU] | ORAL_CAPSULE | ORAL | 0 refills | Status: DC

## 2019-02-14 NOTE — Progress Notes (Signed)
Office: [phone]  /  Fax: [phone]   HPI:   Chief Complaint: OBESITY Yvonne [name] is here to discuss her progress with her obesity treatment plan. She is on the Category 3 plan and is following her eating plan approximately 70 % of the time. She states she is exercising with videos and Youtube 30 minutes 5 times per week. Yvonne [name] has done well maintaining weight. She has been traveling and increased eating out. She is ready to get back on track now that she is back in her normal routine.  Her weight is 180 lb (81.6 kg) today and has not lost weight since her last visit. She has lost 24 lbs since starting treatment with [place].  Depression with emotional eating behaviors Yvonne [name]'s mood is stable on Wellbutrin. Her blood pressure is stable and she notes decreased irritability and emotional eating. She has been working on behavior modification techniques to help reduce her emotional eating and has been somewhat successful. Yvonne [name] denies insomnia.  Vitamin [name] Deficiency Yvonne [name] has a diagnosis of vitamin [name] deficiency. She is currently stable on vit [name]. Yvonne [name] denies nausea, vomiting, or muscle weakness.  At risk for osteopenia and osteoporosis Yvonne [name] is at higher risk of osteopenia and osteoporosis due to vitamin [name] deficiency.   ASSESSMENT AND PLAN:  Vitamin [name] deficiency - Plan: Vitamin [name], Ergocalciferol, (DRISDOL) 1.25 MG (50000 UT) CAPS capsule  Depression with anxiety - Plan: buPROPion (WELLBUTRIN) 100 MG tablet, escitalopram (LEXAPRO) 10 MG tablet  At risk for osteoporosis  Class 1 obesity with serious comorbidity and body mass index (BMI) of 30.0 to 30.9 in adult, unspecified obesity type  PLAN:  Vitamin [name] Deficiency Yvonne [name] was informed that low vitamin [name] levels contributes to fatigue and are associated with obesity, breast, and colon cancer. Yvonne [name] agrees to continue to take prescription Vit [name] @50 ,000 IU every week #4 with no refills and will follow up for routine testing of vitamin [name],  at least 2-3 times per year. She was informed of the risk of over-replacement of vitamin [name] and agrees to not increase her dose unless she discusses this with [place] first. Yvonne [name] agrees to follow up in 4 weeks as directed.  At risk for osteopenia and osteoporosis Yvonne [name] was given extended (15 minutes) osteoporosis prevention counseling today. Yvonne [name] is at risk for osteopenia and osteoporosis due to her vitamin [name] deficiency. She was encouraged to take her vitamin [name] and follow her higher calcium diet and increase strengthening exercise to help strengthen her bones and decrease her risk of osteopenia and osteoporosis.  Depression with Emotional Eating Behaviors We discussed behavior modification techniques today to help Yvonne [name] deal with her emotional eating and depression. She has agreed to take Wellbutrin SR 100 mg qd #30 with no refills and Lexapro 10 mg qd #30 with no refills. Yvonne [name] agreed to follow up as directed.  Obesity Yvonne [name] is currently in the action stage of change. As such, her goal is to continue with weight loss efforts. She has agreed to follow the Category 3 plan. Yvonne [name] has been instructed to work up to a goal of 150 minutes of combined cardio and strengthening exercise per week for weight loss and overall health benefits. We discussed the following Behavioral Modification Strategies today: increasing lean protein intake, decreasing simple carbohydrates, and work on meal planning and easy cooking plans.  Yvonne [name] has agreed to follow up with our clinic in 4 weeks. She was informed of the importance of frequent follow up visits to maximize her success with intensive  lifestyle modifications for her multiple health conditions.  ALLERGIES: No Known Allergies  MEDICATIONS: Current Outpatient Medications on File Prior to Visit  Medication Sig Dispense Refill  . norethindrone (CAMILA) 0.35 MG tablet Take 1 tablet by mouth daily.    . polyethylene glycol powder (GLYCOLAX/MIRALAX) powder Take  17 g by mouth daily. 3350 g 0   Current Facility-Administered Medications on File Prior to Visit  Medication Dose Route Frequency Provider Last Rate Last Dose  . 0.9 %  sodium chloride infusion  500 mL Intravenous Once Pyrtle, Lajuan Lines, MD        PAST MEDICAL HISTORY: Past Medical History:  Diagnosis Date  . Anemia   . Arthritis   . High blood pressure   . Joint pain   . Kidney stones   . Microscopic hematuria   . Vitamin [name] deficiency     PAST SURGICAL HISTORY: Past Surgical History:  Procedure Laterality Date  . NO PAST SURGERIES     Denies surgical history    SOCIAL HISTORY: Social History   Tobacco Use  . Smoking status: Never Smoker  . Smokeless tobacco: Never Used  Substance Use Topics  . Alcohol use: No  . Drug use: No    FAMILY HISTORY: Family History  Problem Relation Age of Onset  . Healthy Father   . Congestive Heart Failure Father   . Heart disease Father   . Diabetes Father   . Other Mother        deceased from multiple myelomas  . Cancer Mother   . Colon cancer Neg Hx   . Esophageal cancer Neg Hx   . Liver cancer Neg Hx   . Pancreatic cancer Neg Hx   . Rectal cancer Neg Hx   . Stomach cancer Neg Hx     ROS: Review of Systems  Constitutional: Negative for weight loss.  Gastrointestinal: Negative for nausea and vomiting.  Musculoskeletal:       Negative for muscle weakness.  Psychiatric/Behavioral: Positive for depression. The patient does not have insomnia.     PHYSICAL EXAM: Blood pressure 125/88, pulse 92, height 5\' 5"  (1.651 m), weight 180 lb (81.6 kg), SpO2 100 %. Body mass index is 29.95 kg/m. Physical Exam Vitals signs reviewed.  Constitutional:      Appearance: Normal appearance. She is obese.  Cardiovascular:     Rate and Rhythm: Normal rate.  Pulmonary:     Effort: Pulmonary effort is normal.  Musculoskeletal: Normal range of motion.  Skin:    General: Skin is warm and dry.  Neurological:     Mental Status: She is alert  and oriented to person, place, and time.  Psychiatric:        Mood and Affect: Mood normal.        Behavior: Behavior normal.     RECENT LABS AND TESTS: BMET    Component Value Date/Time   NA 138 12/26/2018 1219   K 4.5 12/26/2018 1219   CL 102 12/26/2018 1219   CO2 19 (L) 12/26/2018 1219   GLUCOSE 83 12/26/2018 1219   GLUCOSE 86 03/30/2018 0744   BUN 15 12/26/2018 1219   CREATININE 0.85 12/26/2018 1219   CALCIUM 9.5 12/26/2018 1219   GFRNONAA 82 12/26/2018 1219   GFRAA 95 12/26/2018 1219   Lab Results  Component Value Date   HGBA1C 5.9 (H) 12/26/2018   HGBA1C 5.6 02/15/2018   HGBA1C 5.7 (H) 09/13/2017   HGBA1C 6.2 (H) 05/17/2017   HGBA1C 5.8 10/12/2014  Lab Results  Component Value Date   INSULIN 9.3 12/26/2018   INSULIN 28.8 (H) 02/15/2018   INSULIN 35.7 (H) 09/13/2017   INSULIN 27.9 (H) 05/17/2017   CBC    Component Value Date/Time   WBC 6.8 12/26/2018 1219   WBC 7.7 03/30/2018 0744   RBC 4.61 12/26/2018 1219   RBC 4.36 03/30/2018 0744   HGB 14.3 12/26/2018 1219   HCT 41.7 12/26/2018 1219   PLT 340 12/26/2018 1219   MCV 91 12/26/2018 1219   MCH 31.0 12/26/2018 1219   MCHC 34.3 12/26/2018 1219   MCHC 33.5 03/30/2018 0744   RDW 13.2 12/26/2018 1219   LYMPHSABS 2.7 12/26/2018 1219   MONOABS 0.6 03/30/2018 0744   EOSABS 0.1 12/26/2018 1219   BASOSABS 0.1 12/26/2018 1219   Iron/TIBC/Ferritin/ %Sat No results found for: IRON, TIBC, FERRITIN, IRONPCTSAT Lipid Panel     Component Value Date/Time   CHOL 238 (H) 12/26/2018 1219   TRIG 63 12/26/2018 1219   HDL 74 12/26/2018 1219   CHOLHDL 3.2 12/26/2018 1219   CHOLHDL 3 03/30/2018 0744   VLDL 13.2 03/30/2018 0744   LDLCALC 151 (H) 12/26/2018 1219   Hepatic Function Panel     Component Value Date/Time   PROT 7.0 12/26/2018 1219   ALBUMIN 4.7 12/26/2018 1219   AST 12 12/26/2018 1219   ALT 14 12/26/2018 1219   ALKPHOS 100 12/26/2018 1219   BILITOT 0.5 12/26/2018 1219   BILIDIR 0.1 03/30/2018 0744     IBILI 0.2 09/23/2011 0911      Component Value Date/Time   TSH 1.070 12/26/2018 1219   TSH 1.32 03/30/2018 0744   TSH 0.644 05/17/2017 1142   Results for Yvonne [name], Yvonne [name] Yvonne [name] (MRN [id number]) as of 02/14/2019 11:22  Ref. Range 12/26/2018 12:19  Vitamin [name], 25-Hydroxy Latest Ref Range: 30.0 - 100.0 ng/mL 35.9   OBESITY BEHAVIORAL INTERVENTION VISIT  Today's visit was # 34  Starting weight: 204 lbs Starting date: 05/17/17 Today's weight : Weight: 180 lb (81.6 kg)  Today's date: 02/13/2019 Total lbs lost to date: 24    02/13/2019  Height 5\' 5"  (1.651 m)  Weight 180 lb (81.6 kg)  BMI (Calculated) 29.95  BLOOD PRESSURE - SYSTOLIC 253  BLOOD PRESSURE - DIASTOLIC 88   Body Fat % 66.4 %  Total Body Water (lbs) 76.6 lbs   ASK: We discussed the diagnosis of obesity with Yvonne [name] today and Yvonne [name] agreed to give [place] permission to discuss obesity behavioral modification therapy today.  ASSESS: Yvonne [name] has the diagnosis of obesity and her BMI today is 29.95. Yvonne [name] is in the action stage of change.   ADVISE: Yvonne [name] was educated on the multiple health risks of obesity as well as the benefit of weight loss to improve her health. She was advised of the need for long term treatment and the importance of lifestyle modifications to improve her current health and to decrease her risk of future health problems.  AGREE: Multiple dietary modification options and treatment options were discussed and Sherel agreed to follow the recommendations documented in the above note.  ARRANGE: Yvonne [name] was educated on the importance of frequent visits to treat obesity as outlined per CMS and USPSTF guidelines and agreed to schedule her next follow up appointment today.  IMarcille Blanco, CMA, am acting as transcriptionist for Starlyn Skeans, MD  I have reviewed the above documentation for accuracy and completeness, and I agree with the above. -Dennard Nip, MD

## 2019-02-21 MED FILL — ESCITALOPRAM 10 MG TABLET: 10 | 30 days supply | Qty: 30 | Fill #0

## 2019-02-21 MED FILL — buPROPion HCL 100 MG TABS: 100 | 30 days supply | Qty: 30 | Fill #0

## 2019-02-21 MED FILL — VIT D2 1.25 MG (50,000 UNIT: 1.25 MG | 28 days supply | Qty: 4 | Fill #0

## 2019-02-28 ENCOUNTER — Encounter (INDEPENDENT_AMBULATORY_CARE_PROVIDER_SITE_OTHER): Payer: Self-pay

## 2019-03-13 ENCOUNTER — Other Ambulatory Visit: Payer: Self-pay

## 2019-03-13 ENCOUNTER — Encounter (INDEPENDENT_AMBULATORY_CARE_PROVIDER_SITE_OTHER): Payer: Self-pay | Admitting: Family Medicine

## 2019-03-13 ENCOUNTER — Ambulatory Visit (INDEPENDENT_AMBULATORY_CARE_PROVIDER_SITE_OTHER): Payer: 59 | Admitting: Family Medicine

## 2019-03-13 ENCOUNTER — Ambulatory Visit (INDEPENDENT_AMBULATORY_CARE_PROVIDER_SITE_OTHER): Payer: Self-pay | Admitting: Family Medicine

## 2019-03-13 DIAGNOSIS — Z6830 Body mass index (BMI) 30.0-30.9, adult: Secondary | ICD-10-CM | POA: Diagnosis not present

## 2019-03-13 DIAGNOSIS — F418 Other specified anxiety disorders: Secondary | ICD-10-CM | POA: Diagnosis not present

## 2019-03-13 DIAGNOSIS — E559 Vitamin D deficiency, unspecified: Secondary | ICD-10-CM

## 2019-03-13 DIAGNOSIS — E669 Obesity, unspecified: Secondary | ICD-10-CM

## 2019-03-13 MED ORDER — BUPROPION HCL 100 MG PO TABS: 100.0000 mg | ORAL_TABLET | Freq: Every day | ORAL | 0 refills | Status: DC

## 2019-03-13 MED ORDER — VITAMIN D (ERGOCALCIFEROL) 1.25 MG (50000 UNIT) PO CAPS: 50000.0000 [IU] | ORAL_CAPSULE | ORAL | 0 refills | Status: DC

## 2019-03-13 MED ORDER — ESCITALOPRAM OXALATE 10 MG PO TABS: 10.0000 mg | ORAL_TABLET | Freq: Every day | ORAL | 0 refills | Status: DC

## 2019-03-13 NOTE — Progress Notes (Signed)
Office: [phone]  /  Fax: [phone] TeleHealth Visit:  Yvonne [name] has verbally consented to this TeleHealth visit today. The patient is located at work, the provider is located at the [employer] [facility] office. The participants in this visit include the listed provider and patient. Yvonne [name] was unable to use Face Time today and the telehealth visit was conducted via telephone.  HPI:   Chief Complaint: OBESITY Yvonne [name] is here to discuss her progress with her obesity treatment plan. She is on the Category 3 plan and is following her eating plan approximately 50 % of the time. She states she is exercising 0 minutes 0 times per week. Yvonne [name] notes increased stress and has traveled. She has not been able to follow her plan. She thinks she has gained 8 pounds since her last visit. She was in isolation for 2 weeks due to traveling.    We were unable to weigh the patient today for this TeleHealth visit. She feels as if she has gained weight since her last visit. She has lost 24 lbs since starting treatment with [place].  Vitamin [name] Deficiency Yvonne [name] has a diagnosis of vitamin [name] deficiency. She is currently stable on vit [name] and her last level had dropped below goal. Yvonne [name] denies nausea, vomiting, or muscle weakness.  Depression with emotional eating behaviors Yvonne [name]'s mood has decreased in the last month. She notes increased irritability and increased stress eating. She is taking her medications regularly. Yvonne [name] is struggling with emotional eating and using food for comfort to the extent that it is negatively impacting her health. She often snacks when she is not hungry. Yvonne [name] sometimes feels she is out of control and then feels guilty that she made poor food choices. She has been working on behavior modification techniques to help reduce her emotional eating and has been somewhat successful. She shows no sign of suicidal or homicidal ideations.  ASSESSMENT AND PLAN:  Vitamin [name] deficiency -  Plan: Vitamin [name], Ergocalciferol, (DRISDOL) 1.25 MG (50000 UT) CAPS capsule  Depression with anxiety - Plan: buPROPion (WELLBUTRIN) 100 MG tablet, escitalopram (LEXAPRO) 10 MG tablet  Class 1 obesity with serious comorbidity and body mass index (BMI) of 30.0 to 30.9 in adult, unspecified obesity type  PLAN:  Vitamin [name] Deficiency Yvonne [name] was informed that low vitamin [name] levels contribute to fatigue and are associated with obesity, breast, and colon cancer. Yvonne [name] agrees to continue to take prescription Vit [name] @50 ,000 IU every week #4 with no refills and will follow up for routine testing of vitamin [name], at least 2-3 times per year. She was informed of the risk of over-replacement of vitamin [name] and agrees to not increase her dose unless she discusses this with [place] first. We will recheck labs in 1 month and Yvonne [name] agrees to follow up in 3 weeks as directed.  Depression with Emotional Eating Behaviors We discussed cognitive behavior modification techniques to help reduce emotional eating to help Yvonne [name] deal with her emotional eating and depression. She has agreed to continue to take Wellbutrin SR 100 mg qd #30 and Lexapro 10 mg #30 qd with no refills and agreed to follow up as directed.   Obesity Yvonne [name] is currently in the action stage of change. As such, her goal is to continue with weight loss efforts. She has agreed to follow the Category 3 plan. Yvonne [name] has been instructed to work up to a goal of 150 minutes of combined cardio and strengthening exercise per week for weight loss and overall  health benefits. We discussed the following Behavioral Modification Strategies today: emotional eating strategies, ways to avoid boredom eating, keeping healthy foods in the home, better snacking choices, and ways to avoid night time snacking.  Yvonne [name] has agreed to follow up with our clinic in 3 weeks. She was informed of the importance of frequent follow up visits to maximize her success with intensive lifestyle  modifications for her multiple health conditions.  ALLERGIES: No Known Allergies  MEDICATIONS: Current Outpatient Medications on File Prior to Visit  Medication Sig Dispense Refill  . buPROPion (WELLBUTRIN) 100 MG tablet Take 1 tablet (100 mg total) by mouth daily. 30 tablet 0  . escitalopram (LEXAPRO) 10 MG tablet Take 1 tablet (10 mg total) by mouth daily. 30 tablet 0  . norethindrone (CAMILA) 0.35 MG tablet Take 1 tablet by mouth daily.    . polyethylene glycol powder (GLYCOLAX/MIRALAX) powder Take 17 g by mouth daily. 3350 g 0  . Vitamin [name], Ergocalciferol, (DRISDOL) 1.25 MG (50000 UT) CAPS capsule Take 1 capsule (50,000 Units total) by mouth every 7 (seven) days. 4 capsule 0   Current Facility-Administered Medications on File Prior to Visit  Medication Dose Route Frequency Provider Last Rate Last Dose  . 0.9 %  sodium chloride infusion  500 mL Intravenous Once Pyrtle, Lajuan Lines, MD        PAST MEDICAL HISTORY: Past Medical History:  Diagnosis Date  . Anemia   . Arthritis   . High blood pressure   . Joint pain   . Kidney stones   . Microscopic hematuria   . Vitamin [name] deficiency     PAST SURGICAL HISTORY: Past Surgical History:  Procedure Laterality Date  . NO PAST SURGERIES     Denies surgical history    SOCIAL HISTORY: Social History   Tobacco Use  . Smoking status: Never Smoker  . Smokeless tobacco: Never Used  Substance Use Topics  . Alcohol use: No  . Drug use: No    FAMILY HISTORY: Family History  Problem Relation Age of Onset  . Healthy Father   . Congestive Heart Failure Father   . Heart disease Father   . Diabetes Father   . Other Mother        deceased from multiple myelomas  . Cancer Mother   . Colon cancer Neg Hx   . Esophageal cancer Neg Hx   . Liver cancer Neg Hx   . Pancreatic cancer Neg Hx   . Rectal cancer Neg Hx   . Stomach cancer Neg Hx     ROS: Review of Systems  Gastrointestinal: Negative for nausea and vomiting.   Musculoskeletal:       Negative for muscle weakness.  Psychiatric/Behavioral: Positive for depression. Negative for suicidal ideas.       Negative for homicidal ideations.    PHYSICAL EXAM: Pt in no acute distress  RECENT LABS AND TESTS: BMET    Component Value Date/Time   NA 138 12/26/2018 1219   K 4.5 12/26/2018 1219   CL 102 12/26/2018 1219   CO2 19 (L) 12/26/2018 1219   GLUCOSE 83 12/26/2018 1219   GLUCOSE 86 03/30/2018 0744   BUN 15 12/26/2018 1219   CREATININE 0.85 12/26/2018 1219   CALCIUM 9.5 12/26/2018 1219   GFRNONAA 82 12/26/2018 1219   GFRAA 95 12/26/2018 1219   Lab Results  Component Value Date   HGBA1C 5.9 (H) 12/26/2018   HGBA1C 5.6 02/15/2018   HGBA1C 5.7 (H) 09/13/2017   HGBA1C 6.2 (  H) 05/17/2017   HGBA1C 5.8 10/12/2014   Lab Results  Component Value Date   INSULIN 9.3 12/26/2018   INSULIN 28.8 (H) 02/15/2018   INSULIN 35.7 (H) 09/13/2017   INSULIN 27.9 (H) 05/17/2017   CBC    Component Value Date/Time   WBC 6.8 12/26/2018 1219   WBC 7.7 03/30/2018 0744   RBC 4.61 12/26/2018 1219   RBC 4.36 03/30/2018 0744   HGB 14.3 12/26/2018 1219   HCT 41.7 12/26/2018 1219   PLT 340 12/26/2018 1219   MCV 91 12/26/2018 1219   MCH 31.0 12/26/2018 1219   MCHC 34.3 12/26/2018 1219   MCHC 33.5 03/30/2018 0744   RDW 13.2 12/26/2018 1219   LYMPHSABS 2.7 12/26/2018 1219   MONOABS 0.6 03/30/2018 0744   EOSABS 0.1 12/26/2018 1219   BASOSABS 0.1 12/26/2018 1219   Iron/TIBC/Ferritin/ %Sat No results found for: IRON, TIBC, FERRITIN, IRONPCTSAT Lipid Panel     Component Value Date/Time   CHOL 238 (H) 12/26/2018 1219   TRIG 63 12/26/2018 1219   HDL 74 12/26/2018 1219   CHOLHDL 3.2 12/26/2018 1219   CHOLHDL 3 03/30/2018 0744   VLDL 13.2 03/30/2018 0744   LDLCALC 151 (H) 12/26/2018 1219   Hepatic Function Panel     Component Value Date/Time   PROT 7.0 12/26/2018 1219   ALBUMIN 4.7 12/26/2018 1219   AST 12 12/26/2018 1219   ALT 14 12/26/2018 1219    ALKPHOS 100 12/26/2018 1219   BILITOT 0.5 12/26/2018 1219   BILIDIR 0.1 03/30/2018 0744   IBILI 0.2 09/23/2011 0911      Component Value Date/Time   TSH 1.070 12/26/2018 1219   TSH 1.32 03/30/2018 0744   TSH 0.644 05/17/2017 1142   Results for Yvonne [name], Yvonne [name] Yvonne [name] (MRN [id number]) as of 03/13/2019 13:23  Ref. Range 12/26/2018 12:19  Vitamin [name], 25-Hydroxy Latest Ref Range: 30.0 - 100.0 ng/mL 35.9     I, Marcille Blanco, CMA, am acting as transcriptionist for Starlyn Skeans, MD I have reviewed the above documentation for accuracy and completeness, and I agree with the above. -Dennard Nip, MD

## 2019-03-27 MED FILL — buPROPion HCL 100 MG TABS: 100 | 30 days supply | Qty: 30 | Fill #0

## 2019-03-27 MED FILL — VIT D2 1.25 MG (50,000 UNIT: 1.25 MG | 28 days supply | Qty: 4 | Fill #0

## 2019-03-27 MED FILL — ESCITALOPRAM 10 MG TABLET: 10 | 30 days supply | Qty: 30 | Fill #0

## 2019-04-04 ENCOUNTER — Ambulatory Visit (INDEPENDENT_AMBULATORY_CARE_PROVIDER_SITE_OTHER): Payer: 59 | Admitting: Family Medicine

## 2019-04-04 ENCOUNTER — Encounter (INDEPENDENT_AMBULATORY_CARE_PROVIDER_SITE_OTHER): Payer: Self-pay | Admitting: Family Medicine

## 2019-04-04 ENCOUNTER — Other Ambulatory Visit: Payer: Self-pay

## 2019-04-04 DIAGNOSIS — E559 Vitamin D deficiency, unspecified: Secondary | ICD-10-CM

## 2019-04-04 DIAGNOSIS — Z6830 Body mass index (BMI) 30.0-30.9, adult: Secondary | ICD-10-CM

## 2019-04-04 DIAGNOSIS — E669 Obesity, unspecified: Secondary | ICD-10-CM

## 2019-04-04 DIAGNOSIS — F418 Other specified anxiety disorders: Secondary | ICD-10-CM

## 2019-04-04 MED ORDER — ESCITALOPRAM OXALATE 10 MG PO TABS: 10.0000 mg | ORAL_TABLET | Freq: Every day | ORAL | 0 refills | Status: DC

## 2019-04-04 MED ORDER — VITAMIN D (ERGOCALCIFEROL) 1.25 MG (50000 UNIT) PO CAPS: 50000.0000 [IU] | ORAL_CAPSULE | ORAL | 0 refills | Status: DC

## 2019-04-04 MED ORDER — BUPROPION HCL 100 MG PO TABS: 100.0000 mg | ORAL_TABLET | Freq: Every day | ORAL | 0 refills | Status: DC

## 2019-04-05 NOTE — Progress Notes (Signed)
Office: [phone]  /  Fax: [phone] TeleHealth Visit:  Yvonne [name] has verbally consented to this TeleHealth visit today. The patient is located at home, the provider is located at the [employer] [facility] office. The participants in this visit include the listed provider and patient. The visit was conducted today via Face Time.  HPI:   Chief Complaint: OBESITY Yvonne [name] is here to discuss her progress with her obesity treatment plan. She is on the Category 3 plan and is following her eating plan approximately 30 to 40 % of the time. She states she is exercising 0 minutes 0 times per week. Armanii has been struggling with increased anxiety and is off track with her eating. She notes increased stress eating and snacking on high calories.high sugar foods. This has worsened since her hours have been reduced during the COVID19 isolation. She is skipping her regular medicines and snacking much more often.   We were unable to weigh the patient today for this TeleHealth visit. She feels as if she has gained weight since her last visit. She has lost 24 lbs since starting treatment with [place].  Vitamin D Deficiency Yvonne [name] has a diagnosis of vitamin D deficiency. She is currently stable on vit D, but is not yet at goal. Yvonne [name] denies nausea, vomiting, or muscle weakness.  Depression with Anxiety Yvonne [name] 's mood has decreased and her anxiety has increased with recent stress from Yvonne [name] and work. She notes increased comfort and stress eating to the extent that it is negatively impacting her health. She often snacks when she is not hungry. Yvonne [name] sometimes feels she is out of control and then feels guilty that she made poor food choices. She has been working on behavior modification techniques to help reduce her emotional eating and has been somewhat successful. She shows no sign of suicidal or homicidal ideations.  ASSESSMENT AND PLAN:  Vitamin D deficiency - Plan: Vitamin D, Ergocalciferol,  (DRISDOL) 1.25 MG (50000 UT) CAPS capsule  Depression with anxiety - Plan: buPROPion (WELLBUTRIN) 100 MG tablet, escitalopram (LEXAPRO) 10 MG tablet  Class 1 obesity with serious comorbidity and body mass index (BMI) of 30.0 to 30.9 in adult, unspecified obesity type - Starting BMI greater then 30  PLAN:  Vitamin D Deficiency Sanjna was informed that low vitamin D levels contribute to fatigue and are associated with obesity, breast, and colon cancer. Yvonne [name] agrees to continue to take prescription Vit D @50 ,000 IU every week #4 with no refills and will follow up for routine testing of vitamin D, at least 2-3 times per year. She was informed of the risk of over-replacement of vitamin D and agrees to not increase her dose unless she discusses this with [place] first. Yvonne [name] agrees to follow up in 2 weeks as directed.  Depression with Emotional Eating Behaviors We discussed stress reducing strategies to help her cope during the pandemic and behavior modification techniques today to help Yvonne [name] deal with her emotional eating and depression. She has agreed to continue to take Lexapro 10 mg qd #30 and Wellbutrin SR 100 mg qd #30 with no refills. Yvonne [name] agreed to follow up as directed.  Obesity Yvonne [name] is currently in the action stage of change. As such, her goal is to continue with weight loss efforts. She has agreed to follow the Category 3 plan. Yvonne [name] has been instructed to work up to a goal of 150 minutes of combined cardio and strengthening exercise per week for weight loss and overall health benefits. We discussed  the following Behavioral Modification Strategies today: work on meal planning and easy cooking plans, emotional eating strategies, ways to avoid boredom eating, and ways to avoid night time snacking.  Daysia has agreed to follow up with our clinic in 2 weeks. She was informed of the importance of frequent follow up visits to maximize her success with intensive lifestyle modifications for her  multiple health conditions.  ALLERGIES: No Known Allergies  MEDICATIONS: Current Outpatient Medications on File Prior to Visit  Medication Sig Dispense Refill  . norethindrone (CAMILA) 0.35 MG tablet Take 1 tablet by mouth daily.    . polyethylene glycol powder (GLYCOLAX/MIRALAX) powder Take 17 g by mouth daily. 3350 g 0   Current Facility-Administered Medications on File Prior to Visit  Medication Dose Route Frequency Provider Last Rate Last Dose  . 0.9 %  sodium chloride infusion  500 mL Intravenous Once Pyrtle, Lajuan Lines, MD        PAST MEDICAL HISTORY: Past Medical History:  Diagnosis Date  . Anemia   . Arthritis   . High blood pressure   . Joint pain   . Kidney stones   . Microscopic hematuria   . Vitamin D deficiency     PAST SURGICAL HISTORY: Past Surgical History:  Procedure Laterality Date  . NO PAST SURGERIES     Denies surgical history    SOCIAL HISTORY: Social History   Tobacco Use  . Smoking status: Never Smoker  . Smokeless tobacco: Never Used  Substance Use Topics  . Alcohol use: No  . Drug use: No    FAMILY HISTORY: Family History  Problem Relation Age of Onset  . Healthy Father   . Congestive Heart Failure Father   . Heart disease Father   . Diabetes Father   . Other Mother        deceased from multiple myelomas  . Cancer Mother   . Colon cancer Neg Hx   . Esophageal cancer Neg Hx   . Liver cancer Neg Hx   . Pancreatic cancer Neg Hx   . Rectal cancer Neg Hx   . Stomach cancer Neg Hx     ROS: Review of Systems  Gastrointestinal: Negative for nausea and vomiting.  Musculoskeletal:       Negative for muscle weakness.  Psychiatric/Behavioral: Positive for depression. The patient is nervous/anxious.     PHYSICAL EXAM: Pt in no acute distress  RECENT LABS AND TESTS: BMET    Component Value Date/Time   NA 138 12/26/2018 1219   K 4.5 12/26/2018 1219   CL 102 12/26/2018 1219   CO2 19 (L) 12/26/2018 1219   GLUCOSE 83 12/26/2018  1219   GLUCOSE 86 03/30/2018 0744   BUN 15 12/26/2018 1219   CREATININE 0.85 12/26/2018 1219   CALCIUM 9.5 12/26/2018 1219   GFRNONAA 82 12/26/2018 1219   GFRAA 95 12/26/2018 1219   Lab Results  Component Value Date   HGBA1C 5.9 (H) 12/26/2018   HGBA1C 5.6 02/15/2018   HGBA1C 5.7 (H) 09/13/2017   HGBA1C 6.2 (H) 05/17/2017   HGBA1C 5.8 10/12/2014   Lab Results  Component Value Date   INSULIN 9.3 12/26/2018   INSULIN 28.8 (H) 02/15/2018   INSULIN 35.7 (H) 09/13/2017   INSULIN 27.9 (H) 05/17/2017   CBC    Component Value Date/Time   WBC 6.8 12/26/2018 1219   WBC 7.7 03/30/2018 0744   RBC 4.61 12/26/2018 1219   RBC 4.36 03/30/2018 0744   HGB 14.3 12/26/2018 1219  HCT 41.7 12/26/2018 1219   PLT 340 12/26/2018 1219   MCV 91 12/26/2018 1219   MCH 31.0 12/26/2018 1219   MCHC 34.3 12/26/2018 1219   MCHC 33.5 03/30/2018 0744   RDW 13.2 12/26/2018 1219   LYMPHSABS 2.7 12/26/2018 1219   MONOABS 0.6 03/30/2018 0744   EOSABS 0.1 12/26/2018 1219   BASOSABS 0.1 12/26/2018 1219   Iron/TIBC/Ferritin/ %Sat No results found for: IRON, TIBC, FERRITIN, IRONPCTSAT Lipid Panel     Component Value Date/Time   CHOL 238 (H) 12/26/2018 1219   TRIG 63 12/26/2018 1219   HDL 74 12/26/2018 1219   CHOLHDL 3.2 12/26/2018 1219   CHOLHDL 3 03/30/2018 0744   VLDL 13.2 03/30/2018 0744   LDLCALC 151 (H) 12/26/2018 1219   Hepatic Function Panel     Component Value Date/Time   PROT 7.0 12/26/2018 1219   ALBUMIN 4.7 12/26/2018 1219   AST 12 12/26/2018 1219   ALT 14 12/26/2018 1219   ALKPHOS 100 12/26/2018 1219   BILITOT 0.5 12/26/2018 1219   BILIDIR 0.1 03/30/2018 0744   IBILI 0.2 09/23/2011 0911      Component Value Date/Time   TSH 1.070 12/26/2018 1219   TSH 1.32 03/30/2018 0744   TSH 0.644 05/17/2017 1142   Results for SHENEQUA, HOWSE (MRN [id number]) as of 04/05/2019 09:37  Ref. Range 12/26/2018 12:19  Vitamin D, 25-Hydroxy Latest Ref Range: 30.0 - 100.0 ng/mL 35.9     I,  Marcille Blanco, CMA, am acting as transcriptionist for Starlyn Skeans, MD I have reviewed the above documentation for accuracy and completeness, and I agree with the above. -Dennard Nip, MD

## 2019-04-07 ENCOUNTER — Encounter: Payer: 59 | Admitting: Family

## 2019-04-11 MED FILL — NORETHINDRONE 0.35 MG TAB: 0.35 | 84 days supply | Qty: 84 | Fill #3

## 2019-04-19 ENCOUNTER — Telehealth: Payer: Self-pay | Admitting: Family

## 2019-04-19 NOTE — Telephone Encounter (Signed)
Pt called wanting to know if she can get CPE lab work along with the cancer screening lab orders on file. Pt has her appt on 05-10-2019. Please inform pt when lab orders in file so pt can schedule an appt.

## 2019-04-20 ENCOUNTER — Encounter (INDEPENDENT_AMBULATORY_CARE_PROVIDER_SITE_OTHER): Payer: Self-pay | Admitting: Family Medicine

## 2019-04-20 ENCOUNTER — Ambulatory Visit (INDEPENDENT_AMBULATORY_CARE_PROVIDER_SITE_OTHER): Payer: 59 | Admitting: Family Medicine

## 2019-04-20 ENCOUNTER — Other Ambulatory Visit: Payer: Self-pay

## 2019-04-20 DIAGNOSIS — E559 Vitamin D deficiency, unspecified: Secondary | ICD-10-CM

## 2019-04-20 DIAGNOSIS — F3289 Other specified depressive episodes: Secondary | ICD-10-CM

## 2019-04-20 DIAGNOSIS — Z6830 Body mass index (BMI) 30.0-30.9, adult: Secondary | ICD-10-CM

## 2019-04-20 DIAGNOSIS — E669 Obesity, unspecified: Secondary | ICD-10-CM | POA: Diagnosis not present

## 2019-04-20 MED ORDER — BUPROPION HCL 100 MG PO TABS: 100.0000 mg | ORAL_TABLET | Freq: Every day | ORAL | 0 refills | Status: DC

## 2019-04-20 MED ORDER — VITAMIN D (ERGOCALCIFEROL) 1.25 MG (50000 UNIT) PO CAPS: 50000.0000 [IU] | ORAL_CAPSULE | ORAL | 0 refills | Status: DC

## 2019-04-20 MED ORDER — ESCITALOPRAM OXALATE 10 MG PO TABS: 10.0000 mg | ORAL_TABLET | Freq: Every day | ORAL | 0 refills | Status: DC

## 2019-04-20 MED FILL — VIT D2 1.25 MG (50,000 UNIT: 1.25 MG | 28 days supply | Qty: 4 | Fill #0

## 2019-04-20 MED FILL — buPROPion HCL 100 MG TABS: 100 | 30 days supply | Qty: 30 | Fill #0

## 2019-04-20 MED FILL — ESCITALOPRAM 10 MG TABLET: 10 | 30 days supply | Qty: 30 | Fill #0

## 2019-04-21 ENCOUNTER — Telehealth: Payer: Self-pay

## 2019-04-21 DIAGNOSIS — Z0000 Encounter for general adult medical examination without abnormal findings: Secondary | ICD-10-CM

## 2019-04-21 DIAGNOSIS — Z807 Family history of other malignant neoplasms of lymphoid, hematopoietic and related tissues: Secondary | ICD-10-CM

## 2019-04-21 DIAGNOSIS — E559 Vitamin D deficiency, unspecified: Secondary | ICD-10-CM

## 2019-04-21 NOTE — Telephone Encounter (Signed)
Patient is requesting her lab orders for her yearly physical so she can come get this done prior to CPE on 05-10-19. She also  Mentioned she gets a screening for maternal h/l multiple myeloma.

## 2019-04-21 NOTE — Telephone Encounter (Signed)
Left detailed message for patient to be aware no lab orders seen on her records. Her cpe labs are usually done when she comes in on 05-10-19. Advised to call back if there is anything else she needs.

## 2019-04-24 NOTE — Progress Notes (Signed)
Office: [phone]  /  Fax: [phone] TeleHealth Visit:  Yvonne [name] has verbally consented to this TeleHealth visit today. The patient is located at home, the provider is located at the [employer] [facility] office. The participants in this visit include the listed provider and patient. Yvonne [name] was unable to use Face Time today and the telehealth visit was conducted via telephone.  HPI:   Chief Complaint: OBESITY Yvonne [name] is here to discuss her progress with her obesity treatment plan. She is on the Category 3 plan and is following her eating plan approximately 50 % of the time. She states she is walking 30 minutes 5 times per week. Kaeley has done better with her meal plan. She has done better finding food at the grocery store this week and states her hunger is controlled.   We were unable to weigh the patient today for this TeleHealth visit. She feels as if she has lost weight since her last visit. She has lost 24 lbs since starting treatment with [place].  Vitamin D Deficiency Yvonne [name] has a diagnosis of vitamin D deficiency. She is currently stable on vit D, but is not yet at goal. Yvonne [name] denies nausea, vomiting, or muscle weakness.  Depression with emotional eating behaviors Yvonne [name] changed taking her Lexapro to afternoon and notes that she is sleeping better. Her mood is good and she feels more in control of her emotional eating and using food for comfort to the extent that it is negatively impacting her health. She often snacks when she is not hungry. She has been working on behavior modification techniques to help reduce her emotional eating and has been somewhat successful.  ASSESSMENT AND PLAN:  Vitamin D deficiency - Plan: Vitamin D, Ergocalciferol, (DRISDOL) 1.25 MG (50000 UT) CAPS capsule  Other depression - with emotional eating  Class 1 obesity with serious comorbidity and body mass index (BMI) of 30.0 to 30.9 in adult, unspecified obesity type - Starting BMI greater  then 30  PLAN:  Vitamin D Deficiency Yvonne [name] was informed that low vitamin D levels contribute to fatigue and are associated with obesity, breast, and colon cancer. Yvonne [name] agrees to continue to take prescription Vit D @50 ,000 IU every week #4 with no refills and will follow up for routine testing of vitamin D, at least 2-3 times per year. She was informed of the risk of over-replacement of vitamin D and agrees to not increase her dose unless she discusses this with [place] first. Yvonne [name] agrees to follow up in 2 to 3 weeks as directed.  Depression with Emotional Eating Behaviors We discussed behavior modification techniques today to help Yvonne [name] deal with her emotional eating and depression. She has agreed to continue to take Wellbutrin SR 10 mg qd #30 and Lexapro 10 mg qd #30 with no refills. Yvonne [name] agreed to follow up as directed in 2 to 3 weeks.  Obesity Yvonne [name] is currently in the action stage of change. As such, her goal is to continue with weight loss efforts. She has agreed to follow the Category 3 plan. Yvonne [name] has been instructed to work up to a goal of 150 minutes of combined cardio and strengthening exercise per week for weight loss and overall health benefits. We discussed the following Behavioral Modification Strategies today: work on meal planning and easy cooking plans and emotional eating strategies.  Yvonne [name] has agreed to follow up with our clinic in 2 to 3 weeks. She was informed of the importance of frequent follow up visits to maximize her  success with intensive lifestyle modifications for her multiple health conditions.  ALLERGIES: No Known Allergies  MEDICATIONS: Current Outpatient Medications on File Prior to Visit  Medication Sig Dispense Refill  . norethindrone (CAMILA) 0.35 MG tablet Take 1 tablet by mouth daily.    . polyethylene glycol powder (GLYCOLAX/MIRALAX) powder Take 17 g by mouth daily. 3350 g 0   Current Facility-Administered Medications on File Prior to Visit   Medication Dose Route Frequency Provider Last Rate Last Dose  . 0.9 %  sodium chloride infusion  500 mL Intravenous Once Pyrtle, Lajuan Lines, MD        PAST MEDICAL HISTORY: Past Medical History:  Diagnosis Date  . Anemia   . Arthritis   . High blood pressure   . Joint pain   . Kidney stones   . Microscopic hematuria   . Vitamin D deficiency     PAST SURGICAL HISTORY: Past Surgical History:  Procedure Laterality Date  . NO PAST SURGERIES     Denies surgical history    SOCIAL HISTORY: Social History   Tobacco Use  . Smoking status: Never Smoker  . Smokeless tobacco: Never Used  Substance Use Topics  . Alcohol use: No  . Drug use: No    FAMILY HISTORY: Family History  Problem Relation Age of Onset  . Healthy Father   . Congestive Heart Failure Father   . Heart disease Father   . Diabetes Father   . Other Mother        deceased from multiple myelomas  . Cancer Mother   . Colon cancer Neg Hx   . Esophageal cancer Neg Hx   . Liver cancer Neg Hx   . Pancreatic cancer Neg Hx   . Rectal cancer Neg Hx   . Stomach cancer Neg Hx     ROS: Review of Systems  Gastrointestinal: Negative for nausea and vomiting.  Musculoskeletal:       Negative for muscle weakness.  Psychiatric/Behavioral: Positive for depression.    PHYSICAL EXAM: Pt in no acute distress  RECENT LABS AND TESTS: BMET    Component Value Date/Time   NA 138 12/26/2018 1219   K 4.5 12/26/2018 1219   CL 102 12/26/2018 1219   CO2 19 (L) 12/26/2018 1219   GLUCOSE 83 12/26/2018 1219   GLUCOSE 86 03/30/2018 0744   BUN 15 12/26/2018 1219   CREATININE 0.85 12/26/2018 1219   CALCIUM 9.5 12/26/2018 1219   GFRNONAA 82 12/26/2018 1219   GFRAA 95 12/26/2018 1219   Lab Results  Component Value Date   HGBA1C 5.9 (H) 12/26/2018   HGBA1C 5.6 02/15/2018   HGBA1C 5.7 (H) 09/13/2017   HGBA1C 6.2 (H) 05/17/2017   HGBA1C 5.8 10/12/2014   Lab Results  Component Value Date   INSULIN 9.3 12/26/2018   INSULIN  28.8 (H) 02/15/2018   INSULIN 35.7 (H) 09/13/2017   INSULIN 27.9 (H) 05/17/2017   CBC    Component Value Date/Time   WBC 6.8 12/26/2018 1219   WBC 7.7 03/30/2018 0744   RBC 4.61 12/26/2018 1219   RBC 4.36 03/30/2018 0744   HGB 14.3 12/26/2018 1219   HCT 41.7 12/26/2018 1219   PLT 340 12/26/2018 1219   MCV 91 12/26/2018 1219   MCH 31.0 12/26/2018 1219   MCHC 34.3 12/26/2018 1219   MCHC 33.5 03/30/2018 0744   RDW 13.2 12/26/2018 1219   LYMPHSABS 2.7 12/26/2018 1219   MONOABS 0.6 03/30/2018 0744   EOSABS 0.1 12/26/2018 1219   BASOSABS  0.1 12/26/2018 1219   Iron/TIBC/Ferritin/ %Sat No results found for: IRON, TIBC, FERRITIN, IRONPCTSAT Lipid Panel     Component Value Date/Time   CHOL 238 (H) 12/26/2018 1219   TRIG 63 12/26/2018 1219   HDL 74 12/26/2018 1219   CHOLHDL 3.2 12/26/2018 1219   CHOLHDL 3 03/30/2018 0744   VLDL 13.2 03/30/2018 0744   LDLCALC 151 (H) 12/26/2018 1219   Hepatic Function Panel     Component Value Date/Time   PROT 7.0 12/26/2018 1219   ALBUMIN 4.7 12/26/2018 1219   AST 12 12/26/2018 1219   ALT 14 12/26/2018 1219   ALKPHOS 100 12/26/2018 1219   BILITOT 0.5 12/26/2018 1219   BILIDIR 0.1 03/30/2018 0744   IBILI 0.2 09/23/2011 0911      Component Value Date/Time   TSH 1.070 12/26/2018 1219   TSH 1.32 03/30/2018 0744   TSH 0.644 05/17/2017 1142    Results for Sanjna, Haskew Jane D (MRN [id number]) as of 04/24/2019 16:09  Ref. Range 12/26/2018 12:19  Vitamin D, 25-Hydroxy Latest Ref Range: 30.0 - 100.0 ng/mL 35.9    I, Marcille Blanco, CMA, am acting as transcriptionist for Starlyn Skeans, MD I have reviewed the above documentation for accuracy and completeness, and I agree with the above. -Dennard Nip, MD

## 2019-04-24 NOTE — Addendum Note (Signed)
Addended by: Debbrah Alar on: 04/24/2019 07:30 AM   Modules accepted: Orders

## 2019-04-24 NOTE — Telephone Encounter (Signed)
Patient scheduled for labs on Friday at her request.

## 2019-04-24 NOTE — Telephone Encounter (Signed)
Please contact pt to schedule a lab draw. Future orders have been placed.

## 2019-04-27 NOTE — Addendum Note (Signed)
Addended by: Kelle Darting A on: 04/27/2019 12:01 PM   Modules accepted: Orders

## 2019-04-27 NOTE — Addendum Note (Signed)
Addended by: Kelle Darting A on: 04/27/2019 12:00 PM   Modules accepted: Orders

## 2019-04-28 ENCOUNTER — Other Ambulatory Visit: Payer: Self-pay

## 2019-04-28 ENCOUNTER — Other Ambulatory Visit (INDEPENDENT_AMBULATORY_CARE_PROVIDER_SITE_OTHER): Payer: 59

## 2019-04-28 DIAGNOSIS — Z0000 Encounter for general adult medical examination without abnormal findings: Secondary | ICD-10-CM

## 2019-04-28 DIAGNOSIS — E559 Vitamin D deficiency, unspecified: Secondary | ICD-10-CM | POA: Diagnosis not present

## 2019-04-28 DIAGNOSIS — Z807 Family history of other malignant neoplasms of lymphoid, hematopoietic and related tissues: Secondary | ICD-10-CM

## 2019-05-02 ENCOUNTER — Encounter: Payer: Self-pay | Admitting: Family

## 2019-05-02 ENCOUNTER — Other Ambulatory Visit: Payer: Self-pay | Admitting: Family

## 2019-05-02 LAB — CBC WITH DIFFERENTIAL/PLATELET
Absolute Monocytes: 655 cells/uL (ref 200–950)
Basophils Absolute: 62 cells/uL (ref 0–200)
Basophils Relative: 0.6 %
Eosinophils Absolute: 156 cells/uL (ref 15–500)
Eosinophils Relative: 1.5 %
HCT: 41.5 % (ref 35.0–45.0)
Hemoglobin: 14.3 g/dL (ref 11.7–15.5)
Lymphs Abs: 3754 cells/uL (ref 850–3900)
MCH: 31.7 pg (ref 27.0–33.0)
MCHC: 34.5 g/dL (ref 32.0–36.0)
MCV: 92 fL (ref 80.0–100.0)
MPV: 10 fL (ref 7.5–12.5)
Monocytes Relative: 6.3 %
Neutro Abs: 5772 cells/uL (ref 1500–7800)
Neutrophils Relative %: 55.5 %
Platelets: 320 10*3/uL (ref 140–400)
RBC: 4.51 10*6/uL (ref 3.80–5.10)
RDW: 12.9 % (ref 11.0–15.0)
Total Lymphocyte: 36.1 %
WBC: 10.4 10*3/uL (ref 3.8–10.8)

## 2019-05-02 LAB — MULTIPLE MYELOMA PANEL, SERUM
Albumin SerPl Elph-Mcnc: 3.6 g/dL (ref 2.9–4.4)
Albumin/Glob SerPl: 1.2 (ref 0.7–1.7)
Alpha 1: 0.2 g/dL (ref 0.0–0.4)
Alpha2 Glob SerPl Elph-Mcnc: 0.7 g/dL (ref 0.4–1.0)
B-Globulin SerPl Elph-Mcnc: 1.2 g/dL (ref 0.7–1.3)
Gamma Glob SerPl Elph-Mcnc: 1 g/dL (ref 0.4–1.8)
Globulin, Total: 3.1 g/dL (ref 2.2–3.9)
IgA/Immunoglobulin A, Serum: 227 mg/dL (ref 87–352)
IgG (Immunoglobin G), Serum: 1037 mg/dL (ref 586–1602)
IgM (Immunoglobulin M), Srm: 49 mg/dL (ref 26–217)
Total Protein: 6.7 g/dL (ref 6.0–8.5)

## 2019-05-02 LAB — BASIC METABOLIC PANEL
BUN: 14 mg/dL (ref 7–25)
CO2: 24 mmol/L (ref 20–32)
Calcium: 9.5 mg/dL (ref 8.6–10.2)
Chloride: 105 mmol/L (ref 98–110)
Creat: 0.85 mg/dL (ref 0.50–1.10)
Glucose, Bld: 78 mg/dL (ref 65–99)
Potassium: 4.2 mmol/L (ref 3.5–5.3)
Sodium: 137 mmol/L (ref 135–146)

## 2019-05-02 LAB — HEPATIC FUNCTION PANEL
AG Ratio: 1.5 (calc) (ref 1.0–2.5)
ALT: 18 U/L (ref 6–29)
AST: 16 U/L (ref 10–35)
Albumin: 4.1 g/dL (ref 3.6–5.1)
Alkaline phosphatase (APISO): 82 U/L (ref 31–125)
Bilirubin, Direct: 0.1 mg/dL (ref 0.0–0.2)
Globulin: 2.7 g/dL (calc) (ref 1.9–3.7)
Indirect Bilirubin: 0.3 mg/dL (calc) (ref 0.2–1.2)
Total Bilirubin: 0.4 mg/dL (ref 0.2–1.2)
Total Protein: 6.8 g/dL (ref 6.1–8.1)

## 2019-05-02 LAB — VITAMIN D 1,25 DIHYDROXY
Vitamin D 1, 25 (OH)2 Total: 45 pg/mL (ref 18–72)
Vitamin D2 1, 25 (OH)2: 28 pg/mL
Vitamin D3 1, 25 (OH)2: 17 pg/mL

## 2019-05-02 LAB — LIPID PANEL
Cholesterol: 210 mg/dL — ABNORMAL HIGH (ref <200)
HDL: 61 mg/dL (ref >=50)
LDL Cholesterol (Calc): 127 mg/dL (calc) — ABNORMAL HIGH
Non-HDL Cholesterol (Calc): 149 mg/dL (calc) — ABNORMAL HIGH (ref <130)
Total CHOL/HDL Ratio: 3.4 (calc) (ref <5.0)
Triglycerides: 116 mg/dL (ref <150)

## 2019-05-02 LAB — TSH: TSH: 0.77 mIU/L

## 2019-05-02 MED ORDER — VITAMIN D3 75 MCG (3000 UT) PO TABS: 1.0000 | ORAL_TABLET | Freq: Every day | ORAL | Status: DC

## 2019-05-03 DIAGNOSIS — Z1151 Encounter for screening for human papillomavirus (HPV): Secondary | ICD-10-CM | POA: Diagnosis not present

## 2019-05-03 DIAGNOSIS — Z6832 Body mass index (BMI) 32.0-32.9, adult: Secondary | ICD-10-CM | POA: Diagnosis not present

## 2019-05-03 DIAGNOSIS — Z01419 Encounter for gynecological examination (general) (routine) without abnormal findings: Secondary | ICD-10-CM | POA: Diagnosis not present

## 2019-05-03 LAB — HM PAP SMEAR: HM Pap smear: NORMAL

## 2019-05-09 DIAGNOSIS — Z1231 Encounter for screening mammogram for malignant neoplasm of breast: Secondary | ICD-10-CM | POA: Diagnosis not present

## 2019-05-09 LAB — HM MAMMOGRAPHY

## 2019-05-10 ENCOUNTER — Encounter: Payer: 59 | Admitting: Family

## 2019-05-11 ENCOUNTER — Ambulatory Visit (INDEPENDENT_AMBULATORY_CARE_PROVIDER_SITE_OTHER): Payer: 59 | Admitting: Family Medicine

## 2019-05-17 ENCOUNTER — Encounter (INDEPENDENT_AMBULATORY_CARE_PROVIDER_SITE_OTHER): Payer: Self-pay | Admitting: Family Medicine

## 2019-05-17 ENCOUNTER — Other Ambulatory Visit: Payer: Self-pay

## 2019-05-17 ENCOUNTER — Ambulatory Visit (INDEPENDENT_AMBULATORY_CARE_PROVIDER_SITE_OTHER): Payer: 59 | Admitting: Family Medicine

## 2019-05-17 DIAGNOSIS — E669 Obesity, unspecified: Secondary | ICD-10-CM

## 2019-05-17 DIAGNOSIS — Z6830 Body mass index (BMI) 30.0-30.9, adult: Secondary | ICD-10-CM | POA: Diagnosis not present

## 2019-05-17 DIAGNOSIS — E559 Vitamin D deficiency, unspecified: Secondary | ICD-10-CM

## 2019-05-17 DIAGNOSIS — F3289 Other specified depressive episodes: Secondary | ICD-10-CM | POA: Diagnosis not present

## 2019-05-17 MED ORDER — ESCITALOPRAM OXALATE 10 MG PO TABS: 10.0000 mg | ORAL_TABLET | Freq: Every day | ORAL | 0 refills | Status: DC

## 2019-05-18 MED FILL — ESCITALOPRAM 10 MG TABLET: 10 | 30 days supply | Qty: 30 | Fill #0

## 2019-05-22 NOTE — Progress Notes (Signed)
Office: [phone]  /  Fax: [phone] TeleHealth Visit:  Yvonne [name] has verbally consented to this TeleHealth visit today. The patient is located at home, the provider is located at the [employer] [facility] office. The participants in this visit include the listed provider and patient. The visit was conducted today via Face Time.  HPI:   Chief Complaint: OBESITY Yvonne [name] is here to discuss her progress with her obesity treatment plan. She is on the Category 3 plan and is following her eating plan approximately 50 % of the time. She states she is exercising 0 minutes 0 times per week. Yvonne [name] continues to do well maintaining her weight, but she has had a lot of work and money stress. She is trying to avoid emotional eating and is somewhat successful. Yvonne [name] thinks that she will do better with weight loss once she can have face to face visits again.  We were unable to weigh the patient today for this TeleHealth visit. She feels as if she has maintained weight since her last visit. She has lost 24 lbs since starting treatment with [place].  Depression with emotional eating behaviors Yvonne [name] has had a mild tremor with Wellbutrin for months, but it appears to have worsened recently when she stopped the Wellbutrin and the tremor is much less. Yvonne [name] is still on Lexapro. She has been working on behavior modification techniques to help reduce her emotional eating and has been somewhat successful. She shows no sign of suicidal or homicidal ideations.  Vitamin D Deficiency Yvonne [name] has a diagnosis of vitamin D deficiency. She has been on prescription vit D and her level is almost at goal. She was changed to OTC vitamin D 3,000 units per day by her PCP and is doing well. Yvonne [name] denies nausea, vomiting, or muscle weakness.  ASSESSMENT AND PLAN:  Other depression - with emotional eating - Plan: escitalopram (LEXAPRO) 10 MG tablet  Vitamin D deficiency  Class 1 obesity with serious comorbidity and  body mass index (BMI) of 30.0 to 30.9 in adult, unspecified obesity type - Starting BMI greater then 30  PLAN:  Depression with Emotional Eating Behaviors We discussed behavior modification techniques today to help [facility] deal with her emotional eating and depression. She has agreed to take Lexapro 10 mg qd #30 with no refills and agreed to follow up as directed in 2 weeks.  Vitamin D Deficiency Yvonne [name] was informed that low vitamin D levels contributes to fatigue and are associated with obesity, breast, and colon cancer. She agrees to continue to take OTC Vit D 3,000 IU every day and will follow up for routine testing of vitamin D, at least 2-3 times per year. She was informed of the risk of over-replacement of vitamin D and agrees to not increase her dose unless she discusses this with [place] first. We will follow Yvonne [name]'s progress and she agrees to follow up at the agreed upon time.  Obesity Yvonne [name] is currently in the action stage of change. As such, her goal is to continue with weight loss efforts. She has agreed to follow the Category 3 plan. Yvonne [name] has been instructed to work up to a goal of 150 minutes of combined cardio and strengthening exercise per week for weight loss and overall health benefits. We discussed the following Behavioral Modification Strategies today: work on meal planning and easy cooking plans, emotional eating strategies, and ways to avoid boredom eating.  Yvonne [name] has agreed to follow up with our clinic in 2 weeks. She was informed  of the importance of frequent follow up visits to maximize her success with intensive lifestyle modifications for her multiple health conditions.  ALLERGIES: No Known Allergies  MEDICATIONS: Current Outpatient Medications on File Prior to Visit  Medication Sig Dispense Refill  . Cholecalciferol (VITAMIN D3) 75 MCG (3000 UT) TABS Take 1 tablet by mouth daily. 30 tablet   . norethindrone (CAMILA) 0.35 MG tablet Take 1 tablet by mouth daily.     . polyethylene glycol powder (GLYCOLAX/MIRALAX) powder Take 17 g by mouth daily. 3350 g 0   Current Facility-Administered Medications on File Prior to Visit  Medication Dose Route Frequency Provider Last Rate Last Dose  . 0.9 %  sodium chloride infusion  500 mL Intravenous Once Pyrtle, Lajuan Lines, MD        PAST MEDICAL HISTORY: Past Medical History:  Diagnosis Date  . Anemia   . Arthritis   . High blood pressure   . Joint pain   . Kidney stones   . Microscopic hematuria   . Vitamin D deficiency     PAST SURGICAL HISTORY: Past Surgical History:  Procedure Laterality Date  . NO PAST SURGERIES     Denies surgical history    SOCIAL HISTORY: Social History   Tobacco Use  . Smoking status: Never Smoker  . Smokeless tobacco: Never Used  Substance Use Topics  . Alcohol use: No  . Drug use: No    FAMILY HISTORY: Family History  Problem Relation Age of Onset  . Healthy Father   . Congestive Heart Failure Father   . Heart disease Father   . Diabetes Father   . Other Mother        deceased from multiple myelomas  . Cancer Mother   . Colon cancer Neg Hx   . Esophageal cancer Neg Hx   . Liver cancer Neg Hx   . Pancreatic cancer Neg Hx   . Rectal cancer Neg Hx   . Stomach cancer Neg Hx     ROS: Review of Systems  Gastrointestinal: Negative for nausea and vomiting.  Musculoskeletal:       Negative for muscle weakness.  Psychiatric/Behavioral: Positive for depression. Negative for suicidal ideas.       Negative for homicidal ideations.    PHYSICAL EXAM: Pt in no acute distress  RECENT LABS AND TESTS: BMET    Component Value Date/Time   NA 137 04/28/2019 1457   NA 138 12/26/2018 1219   K 4.2 04/28/2019 1457   CL 105 04/28/2019 1457   CO2 24 04/28/2019 1457   GLUCOSE 78 04/28/2019 1457   BUN 14 04/28/2019 1457   BUN 15 12/26/2018 1219   CREATININE 0.85 04/28/2019 1457   CALCIUM 9.5 04/28/2019 1457   GFRNONAA 82 12/26/2018 1219   GFRAA 95 12/26/2018 1219    Lab Results  Component Value Date   HGBA1C 5.9 (H) 12/26/2018   HGBA1C 5.6 02/15/2018   HGBA1C 5.7 (H) 09/13/2017   HGBA1C 6.2 (H) 05/17/2017   HGBA1C 5.8 10/12/2014   Lab Results  Component Value Date   INSULIN 9.3 12/26/2018   INSULIN 28.8 (H) 02/15/2018   INSULIN 35.7 (H) 09/13/2017   INSULIN 27.9 (H) 05/17/2017   CBC    Component Value Date/Time   WBC 10.4 04/28/2019 1457   RBC 4.51 04/28/2019 1457   HGB 14.3 04/28/2019 1457   HGB 14.3 12/26/2018 1219   HCT 41.5 04/28/2019 1457   HCT 41.7 12/26/2018 1219   PLT 320 04/28/2019 1457  PLT 340 12/26/2018 1219   MCV 92.0 04/28/2019 1457   MCV 91 12/26/2018 1219   MCH 31.7 04/28/2019 1457   MCHC 34.5 04/28/2019 1457   RDW 12.9 04/28/2019 1457   RDW 13.2 12/26/2018 1219   LYMPHSABS 3,754 04/28/2019 1457   LYMPHSABS 2.7 12/26/2018 1219   MONOABS 0.6 03/30/2018 0744   EOSABS 156 04/28/2019 1457   EOSABS 0.1 12/26/2018 1219   BASOSABS 62 04/28/2019 1457   BASOSABS 0.1 12/26/2018 1219   Iron/TIBC/Ferritin/ %Sat No results found for: IRON, TIBC, FERRITIN, IRONPCTSAT Lipid Panel     Component Value Date/Time   CHOL 210 (H) 04/28/2019 1457   CHOL 238 (H) 12/26/2018 1219   TRIG 116 04/28/2019 1457   HDL 61 04/28/2019 1457   HDL 74 12/26/2018 1219   CHOLHDL 3.4 04/28/2019 1457   VLDL 13.2 03/30/2018 0744   LDLCALC 127 (H) 04/28/2019 1457   Hepatic Function Panel     Component Value Date/Time   PROT 6.8 04/28/2019 1457   PROT 6.7 04/28/2019 1457   ALBUMIN 4.7 12/26/2018 1219   AST 16 04/28/2019 1457   ALT 18 04/28/2019 1457   ALKPHOS 100 12/26/2018 1219   BILITOT 0.4 04/28/2019 1457   BILITOT 0.5 12/26/2018 1219   BILIDIR 0.1 04/28/2019 1457   IBILI 0.3 04/28/2019 1457      Component Value Date/Time   TSH 0.77 04/28/2019 1457   TSH 1.070 12/26/2018 1219   TSH 1.32 03/30/2018 0744   Results for MEGUMI, TREASTER (MRN [id number]) as of 05/22/2019 15:46  Ref. Range 04/28/2019 14:57  Vitamin D 1, 25 (OH) Total  Latest Ref Range: 18 - 72 pg/mL 45  Vitamin D2 1, 25 (OH) Latest Units: pg/mL 28  Vitamin D3 1, 25 (OH) Latest Units: pg/mL 17    I, Marcille Blanco, CMA, am acting as transcriptionist for Starlyn Skeans, MD I have reviewed the above documentation for accuracy and completeness, and I agree with the above. -Dennard Nip, MD

## 2019-05-23 ENCOUNTER — Telehealth: Payer: Self-pay | Admitting: Family

## 2019-05-23 ENCOUNTER — Other Ambulatory Visit: Payer: Self-pay

## 2019-05-23 ENCOUNTER — Encounter: Payer: Self-pay | Admitting: Family

## 2019-05-23 ENCOUNTER — Ambulatory Visit (INDEPENDENT_AMBULATORY_CARE_PROVIDER_SITE_OTHER): Payer: 59 | Admitting: Family

## 2019-05-23 VITALS — BP 117/86 | HR 93 | Temp 98.5°F | Resp 16 | Ht 66.0 in | Wt 192.0 lb

## 2019-05-23 DIAGNOSIS — F32A Depression, unspecified: Secondary | ICD-10-CM | POA: Insufficient documentation

## 2019-05-23 DIAGNOSIS — F329 Major depressive disorder, single episode, unspecified: Secondary | ICD-10-CM | POA: Insufficient documentation

## 2019-05-23 DIAGNOSIS — Z0000 Encounter for general adult medical examination without abnormal findings: Secondary | ICD-10-CM

## 2019-05-23 NOTE — Patient Instructions (Signed)
Continue your work on [employer], exercise, weight loss.

## 2019-05-23 NOTE — Progress Notes (Signed)
Subjective:    Patient ID: Yvonne [name], female    DOB: [birth year], 48 y.o.   MRN: [id number]  HPI  Patient presents today for complete physical.  Immunizations: tetanus 2015 Diet: reports some weight gain during coronovirus Wt Readings from Last 3 Encounters:  05/23/19 192 lb (87.1 kg)  02/13/19 180 lb (81.6 kg)  01/16/19 180 lb (81.6 kg)  Exercise: walks during her lunch break x 30 minutes Pap Smear: 5/27, had a pap Mammogram: 05/10/19 [facility]: schedule Dental: up to date    Review of Systems  Constitutional: Positive for unexpected weight change.  HENT: Negative for hearing loss and rhinorrhea.   Eyes: Negative for visual disturbance.  Respiratory: Negative for cough and shortness of breath.   Cardiovascular: Negative for chest pain and leg swelling.  Gastrointestinal: Negative for constipation and diarrhea.  Genitourinary: Negative for dysuria, frequency and hematuria.  Musculoskeletal: Negative for arthralgias and myalgias.  Skin: Negative for rash.  Neurological: Negative for headaches.  Hematological: Negative for adenopathy.  Psychiatric/Behavioral:       Denies depression/anxiety   Past Medical History:  Diagnosis Date  . Anemia   . Arthritis   . High blood pressure   . Joint pain   . Kidney stones   . Microscopic hematuria   . Vitamin D deficiency      Social History   Socioeconomic History  . Marital status: Married    Spouse name: Not on file  . Number of children: 3  . Years of education: Not on file  . Highest education level: Not on file  Occupational History  . 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 Financial resource strain: Not on file  . Food insecurity    Worry: Not on file    Inability: Not on file  . Transportation needs    Medical: Not on file    Non-medical: Not on file  Tobacco Use  . Smoking status: Never Smoker  . Smokeless tobacco: Never Used  Substance and Sexual Activity  .  Alcohol use: No  . Drug use: No  . Sexual activity: Not on file  Lifestyle  . Physical activity    Days per week: Not on file    Minutes per session: Not on file  . Stress: Not on file  Relationships  . Social Herbalist on phone: Not on file    Gets together: Not on file    Attends religious service: Not on file    Active member of club or organization: Not on file    Attends meetings of clubs or organizations: Not on file    Relationship status: Not on file  . Intimate partner violence    Fear of current or ex partner: Not on file    Emotionally abused: Not on file    Physically abused: Not on file    Forced sexual activity: Not on file  Other Topics Concern  . 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 NO PAST SURGERIES     Denies surgical history    Family History  Problem Relation Age of Onset  . Healthy Father   . Congestive Heart Failure Father   .  Heart disease Father   . Diabetes Father   . Other Mother        deceased from multiple myelomas  . Cancer Mother   . Colon cancer Neg Hx   . Esophageal cancer Neg Hx   . Liver cancer Neg Hx   . Pancreatic cancer Neg Hx   . Rectal cancer Neg Hx   . Stomach cancer Neg Hx     No Known Allergies  Current Outpatient Medications on File Prior to Visit  Medication Sig Dispense Refill  . Cholecalciferol (VITAMIN D3) 75 MCG (3000 UT) TABS Take 1 tablet by mouth daily. 30 tablet   . escitalopram (LEXAPRO) 10 MG tablet Take 1 tablet (10 mg total) by mouth daily. 30 tablet 0  . norethindrone (CAMILA) 0.35 MG tablet Take 1 tablet by mouth daily.    . polyethylene glycol powder (GLYCOLAX/MIRALAX) powder Take 17 g by mouth daily. 3350 g 0   Current Facility-Administered Medications on File Prior to Visit  Medication Dose Route  Frequency Provider Last Rate Last Dose  . 0.9 %  sodium chloride infusion  500 mL Intravenous Once Pyrtle, Lajuan Lines, MD        BP 117/86 (BP Location: Left Arm, Patient Position: Sitting, Cuff Size: Small)   Pulse 93   Temp 98.5 F (36.9 C) (Oral)   Resp 16   Ht 5\' 6"  (1.676 m)   Wt 192 lb (87.1 kg)   SpO2 99%   BMI 30.99 kg/m       Objective:   Physical Exam  Physical Exam  Constitutional: She is oriented to person, place, and time. She appears well-developed and well-nourished. No distress.  HENT:  Head: Normocephalic and atraumatic.  Right Ear: Tympanic membrane and ear canal normal.  Left Ear: Tympanic membrane and ear canal normal.  Mouth/Throat: not examined- wearing mask Eyes: Pupils are equal, round, and reactive to light. No scleral icterus.  Neck: Normal range of motion. No thyromegaly present.  Cardiovascular: Normal rate and regular rhythm.   No murmur heard. Pulmonary/Chest: Effort normal and breath sounds normal. No respiratory distress. He has no wheezes. She has no rales. She exhibits no tenderness.  Abdominal: Soft. Bowel sounds are normal. She exhibits no distension and no mass. There is no tenderness. There is no rebound and no guarding.  Musculoskeletal: She exhibits no edema.  Lymphadenopathy:    She has no cervical adenopathy.  Neurological: She is alert and oriented to person, place, and time. She has normal patellar reflexes. She exhibits normal muscle tone. Coordination normal.  Skin: Skin is warm and dry.  Psychiatric: She has a normal mood and affect. Her behavior is normal. Judgment and thought content normal.            Assessment & Plan:  Preventative care- discussed healthy diet, exercise and weight loss.  Reviewed lab work done several weeks back.  Immunizations reviewed and up to date.       Assessment & Plan:

## 2019-05-23 NOTE — Telephone Encounter (Signed)
Please call Dr. Alanda Slim cousin's office to request pap results and [facility] to request mammo.

## 2019-05-25 DIAGNOSIS — H524 Presbyopia: Secondary | ICD-10-CM | POA: Diagnosis not present

## 2019-05-29 ENCOUNTER — Encounter (INDEPENDENT_AMBULATORY_CARE_PROVIDER_SITE_OTHER): Payer: Self-pay | Admitting: Family Medicine

## 2019-05-29 ENCOUNTER — Other Ambulatory Visit: Payer: Self-pay

## 2019-05-29 ENCOUNTER — Ambulatory Visit (INDEPENDENT_AMBULATORY_CARE_PROVIDER_SITE_OTHER): Payer: 59 | Admitting: Family Medicine

## 2019-05-29 VITALS — BP 123/84 | HR 69 | Temp 98.7°F | Ht 65.0 in | Wt 192.0 lb

## 2019-05-29 DIAGNOSIS — E669 Obesity, unspecified: Secondary | ICD-10-CM

## 2019-05-29 DIAGNOSIS — Z9189 Other specified personal risk factors, not elsewhere classified: Secondary | ICD-10-CM | POA: Diagnosis not present

## 2019-05-29 DIAGNOSIS — F418 Other specified anxiety disorders: Secondary | ICD-10-CM | POA: Diagnosis not present

## 2019-05-29 DIAGNOSIS — Z6832 Body mass index (BMI) 32.0-32.9, adult: Secondary | ICD-10-CM | POA: Diagnosis not present

## 2019-05-29 DIAGNOSIS — E8881 Metabolic syndrome: Secondary | ICD-10-CM | POA: Diagnosis not present

## 2019-05-30 MED ORDER — BD PEN NEEDLE NANO 2ND GEN 32G X 4 MM MISC: 1.0000 | Freq: Two times a day (BID) | 0 refills | Status: DC

## 2019-05-30 MED ORDER — SAXENDA 18 MG/3ML ~~LOC~~ SOPN: 3.0000 mg | PEN_INJECTOR | Freq: Every day | SUBCUTANEOUS | 0 refills | Status: DC

## 2019-05-30 MED ORDER — ESCITALOPRAM OXALATE 10 MG PO TABS: 10.0000 mg | ORAL_TABLET | Freq: Every day | ORAL | 0 refills | Status: DC

## 2019-05-30 MED FILL — UNIFINE PENTIPS 32GX5/32": 32G X 4 MM | 50 days supply | Qty: 100 | Fill #0

## 2019-05-30 MED FILL — UNIFINE PENTIPS 32GX5/32: 32G X 4 MM | 50 days supply | Qty: 100 | Fill #0

## 2019-05-31 NOTE — Telephone Encounter (Signed)
Medical records request forms faxed to Dr. Warnell Bureau and [facility] mammography

## 2019-05-31 NOTE — Progress Notes (Signed)
Office: [phone]  /  Fax: [phone]   HPI:   Chief Complaint: OBESITY Yvonne [name] is here to discuss her progress with her obesity treatment plan. She is on the Category 3 plan and is following her eating plan approximately 50 % of the time. She states she is walking 30 minutes 5 times per week. Yvonne [name] has gained 12 pounds over the last 3 months while in [place] isolation. She is struggling with both hunger and cravings and would like to discuss medication options. Yvonne [name] denies a history of pancreatitis or any family history of thyroid cancer.  Her weight is 192 lb (87.1 kg) today and has had a weight gain of 12 pounds over a period of 3 months since her last visit. She has lost 12 lbs since starting treatment with [place].  Depression with Anxiety Yvonne [name]'s mood is stable on Lexapro. She is still struggling with workplace temptations and sabotage. She is struggling with emotional eating and using food for comfort to the extent that it is negatively impacting her health. She often snacks when she is not hungry. Yvonne [name] sometimes feels she is out of control and then feels guilty that she made poor food choices. She has been working on behavior modification techniques to help reduce her emotional eating and has been somewhat successful.   Insulin Resistance Yvonne [name] has a diagnosis of insulin resistance based on her elevated fasting insulin level >5. Although Yvonne [name]'s blood glucose readings are still under good control, insulin resistance puts her at greater risk of metabolic syndrome and diabetes. She is not taking metformin currently and is attempting to control with her diet, but she is still struggling with polyphagia. Yvonne [name] continues to work on diet and exercise to decrease risk of diabetes. She denies hypoglycemia.  At risk for diabetes Yvonne [name] is at higher than average risk for developing diabetes due to her insulin resistance and obesity.   ASSESSMENT AND PLAN:  Insulin resistance   Depression with anxiety - Plan: escitalopram (LEXAPRO) 10 MG tablet  At risk for diabetes mellitus  Class 1 obesity with serious comorbidity and body mass index (BMI) of 32.0 to 32.9 in adult, unspecified obesity type - Plan: Liraglutide -Weight Management (SAXENDA) 18 MG/3ML SOPN, Insulin Pen Needle (BD PEN NEEDLE NANO 2ND GEN) 32G X 4 MM MISC  PLAN:  Insulin Resistance Yvonne [name] will continue to work on weight loss, exercise, and decreasing simple carbohydrates in her diet to help decrease the risk of diabetes. We dicussed metformin including benefits and risks. She was informed that eating too many simple carbohydrates or too many calories at one sitting increases the likelihood of GI side effects. Yvonne [name] agrees to start Saxenda and to get back to her diet, and a prescription was written today. Yvonne [name] agreed to follow up with [place] as directed to monitor her progress in 2 to 3 weeks.  Diabetes risk counseling Yvonne [name] was given extended (15 minutes) diabetes prevention counseling today. She is 48 y.o. female and has risk factors for diabetes including insulin resistance and obesity. We discussed intensive lifestyle modifications today with an emphasis on weight loss as well as increasing exercise and decreasing simple carbohydrates in her diet.  Depression with Anxiety We discussed behavior modification techniques today to help Yvonne [name] deal with her emotional eating and depression. She has agreed to take Lexapro 10 mg qd #30 with no refills and agreed to follow up as directed.  Obesity Yvonne [name] is currently in the action stage of change. As such, her goal is to continue with  weight loss efforts. She has agreed to follow the Category 3 plan. Yvonne [name] has been instructed to work up to a goal of 150 minutes of combined cardio and strengthening exercise per week for weight loss and overall health benefits. We discussed the following Behavioral Modification Strategies today: increasing lean protein intake,  no skipping meals, and work on meal planning and easy cooking plans. We discussed various medication options and Yvonne [name] agreed to start Saxenda 3.0 mg [place] qd #5 pens and nano needles #100 with no refills.  Yvonne [name] has agreed to follow up with our clinic in 2 to 3 weeks. She was informed of the importance of frequent follow up visits to maximize her success with intensive lifestyle modifications for her multiple health conditions.  ALLERGIES: No Known Allergies  MEDICATIONS: Current Outpatient Medications on File Prior to Visit  Medication Sig Dispense Refill  . Cholecalciferol (VITAMIN D3) 75 MCG (3000 UT) TABS Take 1 tablet by mouth daily. 30 tablet   . norethindrone (CAMILA) 0.35 MG tablet Take 1 tablet by mouth daily.    . polyethylene glycol powder (GLYCOLAX/MIRALAX) powder Take 17 g by mouth daily. 3350 g 0   Current Facility-Administered Medications on File Prior to Visit  Medication Dose Route Frequency Provider Last Rate Last Dose  . 0.9 %  sodium chloride infusion  500 mL Intravenous Once Pyrtle, Lajuan Lines, MD        PAST MEDICAL HISTORY: Past Medical History:  Diagnosis Date  . Anemia   . Arthritis   . High blood pressure   . Joint pain   . Kidney stones   . Microscopic hematuria   . Vitamin D deficiency     PAST SURGICAL HISTORY: Past Surgical History:  Procedure Laterality Date  . NO PAST SURGERIES     Denies surgical history    SOCIAL HISTORY: Social History   Tobacco Use  . Smoking status: Never Smoker  . Smokeless tobacco: Never Used  Substance Use Topics  . Alcohol use: No  . Drug use: No    FAMILY HISTORY: Family History  Problem Relation Age of Onset  . Healthy Father   . Congestive Heart Failure Father   . Heart disease Father   . Diabetes Father   . Other Mother        deceased from multiple myelomas  . Cancer Mother   . Colon cancer Neg Hx   . Esophageal cancer Neg Hx   . Liver cancer Neg Hx   . Pancreatic cancer Neg Hx   . Rectal cancer  Neg Hx   . Stomach cancer Neg Hx     ROS: Review of Systems  Endo/Heme/Allergies:       Positive for polyphagia. Negative for hypoglycemia.  Psychiatric/Behavioral: Positive for depression. The patient is nervous/anxious.     PHYSICAL EXAM: Blood pressure 123/84, pulse 69, temperature 98.7 F (37.1 C), height 5\' 5"  (1.651 m), weight 192 lb (87.1 kg), SpO2 97 %. Body mass index is 31.95 kg/m. Physical Exam Vitals signs reviewed.  Constitutional:      Appearance: Normal appearance. She is obese.  Cardiovascular:     Rate and Rhythm: Normal rate.  Pulmonary:     Effort: Pulmonary effort is normal.  Musculoskeletal: Normal range of motion.  Skin:    General: Skin is warm and dry.  Neurological:     Mental Status: She is alert and oriented to person, place, and time.  Psychiatric:        Mood and Affect: Mood  normal.        Behavior: Behavior normal.     RECENT LABS AND TESTS: BMET    Component Value Date/Time   NA 137 04/28/2019 1457   NA 138 12/26/2018 1219   K 4.2 04/28/2019 1457   CL 105 04/28/2019 1457   CO2 24 04/28/2019 1457   GLUCOSE 78 04/28/2019 1457   BUN 14 04/28/2019 1457   BUN 15 12/26/2018 1219   CREATININE 0.85 04/28/2019 1457   CALCIUM 9.5 04/28/2019 1457   GFRNONAA 82 12/26/2018 1219   GFRAA 95 12/26/2018 1219   Lab Results  Component Value Date   HGBA1C 5.9 (H) 12/26/2018   HGBA1C 5.6 02/15/2018   HGBA1C 5.7 (H) 09/13/2017   HGBA1C 6.2 (H) 05/17/2017   HGBA1C 5.8 10/12/2014   Lab Results  Component Value Date   INSULIN 9.3 12/26/2018   INSULIN 28.8 (H) 02/15/2018   INSULIN 35.7 (H) 09/13/2017   INSULIN 27.9 (H) 05/17/2017   CBC    Component Value Date/Time   WBC 10.4 04/28/2019 1457   RBC 4.51 04/28/2019 1457   HGB 14.3 04/28/2019 1457   HGB 14.3 12/26/2018 1219   HCT 41.5 04/28/2019 1457   HCT 41.7 12/26/2018 1219   PLT 320 04/28/2019 1457   PLT 340 12/26/2018 1219   MCV 92.0 04/28/2019 1457   MCV 91 12/26/2018 1219   MCH  31.7 04/28/2019 1457   MCHC 34.5 04/28/2019 1457   RDW 12.9 04/28/2019 1457   RDW 13.2 12/26/2018 1219   LYMPHSABS 3,754 04/28/2019 1457   LYMPHSABS 2.7 12/26/2018 1219   MONOABS 0.6 03/30/2018 0744   EOSABS 156 04/28/2019 1457   EOSABS 0.1 12/26/2018 1219   BASOSABS 62 04/28/2019 1457   BASOSABS 0.1 12/26/2018 1219   Iron/TIBC/Ferritin/ %Sat No results found for: IRON, TIBC, FERRITIN, IRONPCTSAT Lipid Panel     Component Value Date/Time   CHOL 210 (H) 04/28/2019 1457   CHOL 238 (H) 12/26/2018 1219   TRIG 116 04/28/2019 1457   HDL 61 04/28/2019 1457   HDL 74 12/26/2018 1219   CHOLHDL 3.4 04/28/2019 1457   VLDL 13.2 03/30/2018 0744   LDLCALC 127 (H) 04/28/2019 1457   Hepatic Function Panel     Component Value Date/Time   PROT 6.8 04/28/2019 1457   PROT 6.7 04/28/2019 1457   ALBUMIN 4.7 12/26/2018 1219   AST 16 04/28/2019 1457   ALT 18 04/28/2019 1457   ALKPHOS 100 12/26/2018 1219   BILITOT 0.4 04/28/2019 1457   BILITOT 0.5 12/26/2018 1219   BILIDIR 0.1 04/28/2019 1457   IBILI 0.3 04/28/2019 1457      Component Value Date/Time   TSH 0.77 04/28/2019 1457   TSH 1.070 12/26/2018 1219   TSH 1.32 03/30/2018 0744   Results for LETTA, CARGILE (MRN [id number]) as of 05/31/2019 13:47  Ref. Range 12/26/2018 12:19  Vitamin D, 25-Hydroxy Latest Ref Range: 30.0 - 100.0 ng/mL 35.9   OBESITY BEHAVIORAL INTERVENTION VISIT  Today's visit was # 10   Starting weight: 204 lbs Starting date: 05/17/17 Today's weight : Weight: 192 lb (87.1 kg)  Today's date: 05/29/2019 Total lbs lost to date: 12   05/29/2019  Height 5\' 5"  (1.651 m)  Weight 192 lb (87.1 kg)  BMI (Calculated) 31.95  BLOOD PRESSURE - SYSTOLIC 528  BLOOD PRESSURE - DIASTOLIC 84   Body Fat % 44 %  Total Body Water (lbs) 80 lbs   ASK: We discussed the diagnosis of obesity with Aldona Lento today and  Niobe agreed to give [place] permission to discuss obesity behavioral modification therapy today.  ASSESS: Margarett  has the diagnosis of obesity and her BMI today is 31.9. Ronee is in the action stage of change.   ADVISE: Kaelan was educated on the multiple health risks of obesity as well as the benefit of weight loss to improve her health. She was advised of the need for long term treatment and the importance of lifestyle modifications to improve her current health and to decrease her risk of future health problems.  AGREE: Multiple dietary modification options and treatment options were discussed and Genella agreed to follow the recommendations documented in the above note.  ARRANGE: Nisha was educated on the importance of frequent visits to treat obesity as outlined per CMS and USPSTF guidelines and agreed to schedule her next follow up appointment today.  IMarcille Blanco, CMA, am acting as transcriptionist for Starlyn Skeans, MD I have reviewed the above documentation for accuracy and completeness, and I agree with the above. -Dennard Nip, MD

## 2019-06-05 ENCOUNTER — Encounter (INDEPENDENT_AMBULATORY_CARE_PROVIDER_SITE_OTHER): Payer: Self-pay | Admitting: Family Medicine

## 2019-06-05 ENCOUNTER — Telehealth (INDEPENDENT_AMBULATORY_CARE_PROVIDER_SITE_OTHER): Payer: Self-pay | Admitting: Family Medicine

## 2019-06-05 NOTE — Telephone Encounter (Signed)
I sent a my chart message.  Yvonne [name]

## 2019-06-05 NOTE — Telephone Encounter (Signed)
Patient is unable to send or receive My Chart messages at this time.  My Chart is giving her a message that it is currently down.  She is requesting an update on her prescription of Saxenda.  She is hoping to begin taking as soon as possible as she is off work this week & wants to see if she has any side effects.  Please advise.  Thank you

## 2019-06-07 ENCOUNTER — Encounter (INDEPENDENT_AMBULATORY_CARE_PROVIDER_SITE_OTHER): Payer: Self-pay

## 2019-06-07 MED FILL — SAXENDA 18 MG/3 ML PEN: 18 | 30 days supply | Qty: 15 | Fill #0

## 2019-06-14 ENCOUNTER — Encounter: Payer: Self-pay | Admitting: Family

## 2019-06-14 MED FILL — ESCITALOPRAM 10 MG TABLET: 10 | 30 days supply | Qty: 30 | Fill #0

## 2019-06-15 ENCOUNTER — Telehealth (INDEPENDENT_AMBULATORY_CARE_PROVIDER_SITE_OTHER): Payer: 59 | Admitting: Family Medicine

## 2019-06-15 ENCOUNTER — Other Ambulatory Visit: Payer: Self-pay

## 2019-06-15 ENCOUNTER — Encounter (INDEPENDENT_AMBULATORY_CARE_PROVIDER_SITE_OTHER): Payer: Self-pay | Admitting: Family Medicine

## 2019-06-15 DIAGNOSIS — E669 Obesity, unspecified: Secondary | ICD-10-CM | POA: Diagnosis not present

## 2019-06-15 DIAGNOSIS — Z6832 Body mass index (BMI) 32.0-32.9, adult: Secondary | ICD-10-CM

## 2019-06-15 DIAGNOSIS — R7303 Prediabetes: Secondary | ICD-10-CM

## 2019-06-26 MED FILL — NORETHINDRONE 0.35 MG TAB: 0.35 | 84 days supply | Qty: 84 | Fill #4

## 2019-07-03 ENCOUNTER — Ambulatory Visit (INDEPENDENT_AMBULATORY_CARE_PROVIDER_SITE_OTHER): Payer: 59 | Admitting: Family Medicine

## 2019-07-05 ENCOUNTER — Other Ambulatory Visit: Payer: Self-pay

## 2019-07-05 ENCOUNTER — Ambulatory Visit (INDEPENDENT_AMBULATORY_CARE_PROVIDER_SITE_OTHER): Payer: 59 | Admitting: Family

## 2019-07-05 ENCOUNTER — Telehealth (INDEPENDENT_AMBULATORY_CARE_PROVIDER_SITE_OTHER): Payer: 59 | Admitting: Family Medicine

## 2019-07-05 DIAGNOSIS — R112 Nausea with vomiting, unspecified: Secondary | ICD-10-CM | POA: Insufficient documentation

## 2019-07-05 DIAGNOSIS — R197 Diarrhea, unspecified: Secondary | ICD-10-CM | POA: Insufficient documentation

## 2019-07-05 DIAGNOSIS — Z79899 Other long term (current) drug therapy: Secondary | ICD-10-CM | POA: Diagnosis not present

## 2019-07-05 DIAGNOSIS — A084 Viral intestinal infection, unspecified: Secondary | ICD-10-CM | POA: Diagnosis not present

## 2019-07-05 DIAGNOSIS — I10 Essential (primary) hypertension: Secondary | ICD-10-CM | POA: Insufficient documentation

## 2019-07-05 MED ORDER — ONDANSETRON HCL 4 MG PO TABS: 4.0000 mg | ORAL_TABLET | Freq: Three times a day (TID) | ORAL | 0 refills | Status: DC | PRN

## 2019-07-05 MED FILL — ONDANSETRON HCL 4 MG TABLET: 4 | 6 days supply | Qty: 20 | Fill #0

## 2019-07-05 NOTE — Progress Notes (Signed)
Virtual Visit via Video Note  I connected with Aldona Lento on 07/05/19 at  9:20 AM EDT by a video enabled telemedicine application and verified that I am speaking with the correct person using two identifiers.  Location: Patient: home Provider: work   I discussed the limitations of evaluation and management by telemedicine and the availability of in person appointments. The patient expressed understanding and agreed to proceed.  History of Present Illness:  Reports that she developed abdominal pain yesterday AM.  She went work. Came home at [facility] and developed vomiting.  Reports that since that time she has been having diarrhea/loose stool.  Feels briefly better following vomiting. She denies fever.  Has had some occasional chills.  She denies blood in stool. Reports that last week she had contact with a positive covid patient.  She had exposure on 7/17. She denies cough, myalgia, headache, sore throat.    She reports that she has been trying to hydrate with gingerale and has been able to keep it down today.    Past Medical History:  Diagnosis Date  . Anemia   . Arthritis   . High blood pressure   . Joint pain   . Kidney stones   . Microscopic hematuria   . Vitamin D deficiency      Social History   Socioeconomic History  . Marital status: Married    Spouse name: Not on file  . Number of children: 3  . Years of education: Not on file  . Highest education level: Not on file  Occupational History  . 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 Financial resource strain: Not on file  . Food insecurity    Worry: Not on file    Inability: Not on file  . Transportation needs    Medical: Not on file    Non-medical: Not on file  Tobacco Use  . Smoking status: Never Smoker  . Smokeless tobacco: Never Used  Substance and Sexual Activity  . Alcohol use: No  . Drug use: No  . Sexual activity: Not on file  Lifestyle  . Physical activity   Days per week: Not on file    Minutes per session: Not on file  . Stress: Not on file  Relationships  . Social Herbalist on phone: Not on file    Gets together: Not on file    Attends religious service: Not on file    Active member of club or organization: Not on file    Attends meetings of clubs or organizations: Not on file    Relationship status: Not on file  . Intimate partner violence    Fear of current or ex partner: Not on file    Emotionally abused: Not on file    Physically abused: Not on file    Forced sexual activity: Not on file  Other Topics Concern  . 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 NO PAST SURGERIES     Denies surgical history    Family History  Problem Relation Age of Onset  . Healthy Father   . Congestive Heart Failure Father   . Heart disease Father   . Diabetes Father   .  Other Mother        deceased from multiple myelomas  . Cancer Mother   . Colon cancer Neg Hx   . Esophageal cancer Neg Hx   . Liver cancer Neg Hx   . Pancreatic cancer Neg Hx   . Rectal cancer Neg Hx   . Stomach cancer Neg Hx     No Known Allergies  Current Outpatient Medications on File Prior to Visit  Medication Sig Dispense Refill  . Cholecalciferol (VITAMIN D3) 75 MCG (3000 UT) TABS Take 1 tablet by mouth daily. 30 tablet   . escitalopram (LEXAPRO) 10 MG tablet Take 1 tablet (10 mg total) by mouth daily. 30 tablet 0  . Insulin Pen Needle (BD PEN NEEDLE NANO 2ND GEN) 32G X 4 MM MISC 1 Package by Does not apply route 2 (two) times daily. 100 each 0  . Liraglutide -Weight Management (SAXENDA) 18 MG/3ML SOPN Inject 3 mg into the skin daily. 5 pen 0  . norethindrone (CAMILA) 0.35 MG tablet Take 1 tablet by mouth daily.    . polyethylene glycol powder (GLYCOLAX/MIRALAX) powder  Take 17 g by mouth daily. 3350 g 0   Current Facility-Administered Medications on File Prior to Visit  Medication Dose Route Frequency Provider Last Rate Last Dose  . 0.9 %  sodium chloride infusion  500 mL Intravenous Once Pyrtle, Lajuan Lines, MD        There were no vitals taken for this visit.   Observations/Objective:   Gen: Awake, alert, no acute distress Resp: Breathing is even and non-labored Psych: calm/pleasant demeanor Neuro: Alert and Oriented x 3, + facial symmetry, speech is clear.   Assessment and Plan:  Viral gastroenteritis- will give rx for zofran prn.  Pt is advised to drink plenty of fluids, get plenty of rest. She is advised to go to the ER if new/worsening symptoms.  Pt verbalizes understanding.  Follow Up Instructions:    I discussed the assessment and treatment plan with the patient. The patient was provided an opportunity to ask questions and all were answered. The patient agreed with the plan and demonstrated an understanding of the instructions.   The patient was advised to call back or seek an in-person evaluation if the symptoms worsen or if the condition fails to improve as anticipated.  Nance Pear, NP

## 2019-07-06 ENCOUNTER — Encounter (HOSPITAL_COMMUNITY): Payer: Self-pay | Admitting: Emergency Medicine

## 2019-07-06 ENCOUNTER — Encounter (INDEPENDENT_AMBULATORY_CARE_PROVIDER_SITE_OTHER): Payer: Self-pay | Admitting: Family Medicine

## 2019-07-06 ENCOUNTER — Telehealth (INDEPENDENT_AMBULATORY_CARE_PROVIDER_SITE_OTHER): Payer: 59 | Admitting: Family Medicine

## 2019-07-06 ENCOUNTER — Emergency Department (HOSPITAL_COMMUNITY)
Admission: EM | Admit: 2019-07-06 | Discharge: 2019-07-06 | Disposition: A | Discharge status: Home / Self Care | Payer: 59 | Attending: Emergency Medicine | Admitting: Emergency Medicine

## 2019-07-06 DIAGNOSIS — E669 Obesity, unspecified: Secondary | ICD-10-CM

## 2019-07-06 DIAGNOSIS — Z79899 Other long term (current) drug therapy: Secondary | ICD-10-CM | POA: Diagnosis not present

## 2019-07-06 DIAGNOSIS — R112 Nausea with vomiting, unspecified: Secondary | ICD-10-CM | POA: Diagnosis not present

## 2019-07-06 DIAGNOSIS — F418 Other specified anxiety disorders: Secondary | ICD-10-CM | POA: Diagnosis not present

## 2019-07-06 DIAGNOSIS — K5909 Other constipation: Secondary | ICD-10-CM | POA: Diagnosis not present

## 2019-07-06 DIAGNOSIS — R197 Diarrhea, unspecified: Secondary | ICD-10-CM

## 2019-07-06 DIAGNOSIS — I10 Essential (primary) hypertension: Secondary | ICD-10-CM | POA: Diagnosis not present

## 2019-07-06 DIAGNOSIS — Z6832 Body mass index (BMI) 32.0-32.9, adult: Secondary | ICD-10-CM | POA: Diagnosis not present

## 2019-07-06 LAB — CBC
HCT: 42.1 % (ref 36.0–46.0)
Hemoglobin: 13.6 g/dL (ref 12.0–15.0)
MCH: 31.3 pg (ref 26.0–34.0)
MCHC: 32.3 g/dL (ref 30.0–36.0)
MCV: 97 fL (ref 80.0–100.0)
Platelets: 306 10*3/uL (ref 150–400)
RBC: 4.34 MIL/uL (ref 3.87–5.11)
RDW: 13.7 % (ref 11.5–15.5)
WBC: 11.6 10*3/uL — ABNORMAL HIGH (ref 4.0–10.5)
nRBC: 0 % (ref 0.0–0.2)

## 2019-07-06 LAB — COMPREHENSIVE METABOLIC PANEL
ALT: 18 U/L (ref 0–44)
AST: 19 U/L (ref 15–41)
Albumin: 4.3 g/dL (ref 3.5–5.0)
Alkaline Phosphatase: 79 U/L (ref 38–126)
Anion gap: 9 (ref 5–15)
BUN: 13 mg/dL (ref 6–20)
CO2: 23 mmol/L (ref 22–32)
Calcium: 9 mg/dL (ref 8.9–10.3)
Chloride: 107 mmol/L (ref 98–111)
Creatinine, Ser: 0.89 mg/dL (ref 0.44–1.00)
GFR calc Af Amer: >60 mL/min (ref >60)
GFR calc non Af Amer: >60 mL/min (ref >60)
Glucose, Bld: 111 mg/dL — ABNORMAL HIGH (ref 70–99)
Potassium: 3.9 mmol/L (ref 3.5–5.1)
Sodium: 139 mmol/L (ref 135–145)
Total Bilirubin: 0.9 mg/dL (ref 0.3–1.2)
Total Protein: 8.1 g/dL (ref 6.5–8.1)

## 2019-07-06 LAB — URINALYSIS, ROUTINE W REFLEX MICROSCOPIC
Bilirubin Urine: NEGATIVE
Glucose, UA: NEGATIVE mg/dL
Ketones, ur: NEGATIVE mg/dL
Leukocytes,Ua: NEGATIVE
Nitrite: NEGATIVE
Protein, ur: NEGATIVE mg/dL
Specific Gravity, Urine: 1.011 (ref 1.005–1.030)
pH: 6 (ref 5.0–8.0)

## 2019-07-06 LAB — LIPASE, BLOOD: Lipase: 50 U/L (ref 11–51)

## 2019-07-06 LAB — I-STAT BETA HCG BLOOD, ED (MC, WL, AP ONLY): I-stat hCG, quantitative: <5 m[IU]/mL (ref <5)

## 2019-07-06 MED ORDER — ESCITALOPRAM OXALATE 10 MG PO TABS: 10.0000 mg | ORAL_TABLET | Freq: Every day | ORAL | 0 refills | Status: DC

## 2019-07-06 MED ORDER — POLYETHYLENE GLYCOL 3350 17 GM/SCOOP PO POWD: 17.0000 g | Freq: Every day | ORAL | 0 refills | Status: DC

## 2019-07-06 MED ORDER — SAXENDA 18 MG/3ML ~~LOC~~ SOPN: 3.0000 mg | PEN_INJECTOR | Freq: Every day | SUBCUTANEOUS | 0 refills | Status: DC

## 2019-07-06 MED ORDER — SODIUM CHLORIDE 0.9% FLUSH: 3.0000 mL | Freq: Once | INTRAVENOUS | Status: DC

## 2019-07-06 MED FILL — SAXENDA 18 MG/3 ML PEN: 18 | 30 days supply | Qty: 15 | Fill #0

## 2019-07-06 NOTE — ED Triage Notes (Signed)
Patient here from home with complaints of abd pain all over. Nausea, vomiting, diarrhea that started yesterday at 5pm. Denies pregnancy. Reports that she had a virtual visit today and was given Zofran that helped.

## 2019-07-06 NOTE — ED Provider Notes (Signed)
[facility] DEPT Provider Note   CSN: [id number] Arrival date & time: 07/05/19  2258     History   Chief Complaint Chief Complaint  Patient presents with  . Abdominal Pain  . Nausea  . Emesis    HPI Yvonne [name] is a 48 y.o. female.     Patient presents to the emergency department with a chief complaint of nausea, vomiting, and diarrhea.  She states that the symptoms started a day and a half ago.  They were sudden in onset.  She denies any fever, chills, cough, shortness of breath.  She states that she had a telehealth visit with her primary care provider, and was prescribed Zofran.  She reports that this helped, but the abdominal cramps returned tonight.  This is what prompted her to come to the emergency department.  She denies any blood in her vomit or stool.  Denies any focal abdominal pain.  Denies any other associated symptoms.  The history is provided by the patient. No language interpreter was used.    Past Medical History:  Diagnosis Date  . Anemia   . Arthritis   . High blood pressure   . Joint pain   . Kidney stones   . Microscopic hematuria   . Vitamin D deficiency     Patient Active Problem List   Diagnosis Date Noted  . Depression 05/23/2019  . Plantar fasciitis 08/23/2017  . Insulin resistance 08/23/2017  . Bilateral plantar fasciitis 07/07/2017  . Hyperlipidemia 06/01/2017  . At risk for diabetes mellitus 06/01/2017  . Prediabetes 06/01/2017  . Class 1 obesity with serious comorbidity and body mass index (BMI) of 30.0 to 30.9 in adult 06/01/2017  . Class 1 obesity without serious comorbidity with body mass index (BMI) of 34.0 to 34.9 in adult 05/17/2017  . Other fatigue 05/17/2017  . Hypertension 06/30/2015  . Family history of multiple myeloma 10/13/2013  . Nevus 10/07/2012  . Seasonal and perennial allergic rhinitis 04/23/2012  . Knee pain 03/06/2012  . Snoring 03/06/2012  . Preventative health care 09/27/2011   . Abnormal liver enzymes 09/27/2011  . GRIEF REACTION 09/02/2009  . MICROSCOPIC HEMATURIA 09/02/2009  . Vitamin D deficiency 02/20/2009    Past Surgical History:  Procedure Laterality Date  . NO PAST SURGERIES     Denies surgical history     OB History    Gravida  4   Para  3   Term  3   Preterm      AB      Living        SAB      TAB      Ectopic      Multiple      Live Births               Home Medications    Prior to Admission medications   Medication Sig Start Date End Date Taking? Authorizing Provider  Cholecalciferol (VITAMIN D3) 75 MCG (3000 UT) TABS Take 1 tablet by mouth daily. 05/02/19   Debbrah Alar, NP  escitalopram (LEXAPRO) 10 MG tablet Take 1 tablet (10 mg total) by mouth daily. 05/30/19   Dennard Nip D, MD  Insulin Pen Needle (BD PEN NEEDLE NANO 2ND GEN) 32G X 4 MM MISC 1 Package by Does not apply route 2 (two) times daily. 05/30/19   Dennard Nip D, MD  Liraglutide -Weight Management (SAXENDA) 18 MG/3ML SOPN Inject 3 mg into the skin daily. 05/30/19   Leafy Ro,  Caren D, MD  norethindrone (CAMILA) 0.35 MG tablet Take 1 tablet by mouth daily.    [provider]  ondansetron (ZOFRAN) 4 MG tablet Take 1 tablet (4 mg total) by mouth every 8 (eight) hours as needed for nausea or vomiting. 07/05/19   Debbrah Alar, NP  polyethylene glycol powder (GLYCOLAX/MIRALAX) powder Take 17 g by mouth daily. 07/12/17   Starlyn Skeans, MD    Family History Family History  Problem Relation Age of Onset  . Healthy Father   . Congestive Heart Failure Father   . Heart disease Father   . Diabetes Father   . Other Mother        deceased from multiple myelomas  . Cancer Mother   . Colon cancer Neg Hx   . Esophageal cancer Neg Hx   . Liver cancer Neg Hx   . Pancreatic cancer Neg Hx   . Rectal cancer Neg Hx   . Stomach cancer Neg Hx     Social History Social History   Tobacco Use  . Smoking status: Never Smoker  . Smokeless  tobacco: Never Used  Substance Use Topics  . Alcohol use: No  . Drug use: No     Allergies   Patient has no known allergies.   Review of Systems Review of Systems  All other systems reviewed and are negative.    Physical Exam Updated Vital Signs BP (!) 145/105 (BP Location: Left Arm)   Pulse 93   Temp 99.9 F (37.7 C) (Oral)   Resp 20   SpO2 99%   Physical Exam Vitals signs and nursing note reviewed.  Constitutional:      General: She is not in acute distress.    Appearance: She is well-developed.  HENT:     Head: Normocephalic and atraumatic.  Eyes:     Conjunctiva/sclera: Conjunctivae normal.  Neck:     Musculoskeletal: Neck supple.  Cardiovascular:     Rate and Rhythm: Normal rate and regular rhythm.     Heart sounds: No murmur.  Pulmonary:     Effort: Pulmonary effort is normal. No respiratory distress.     Breath sounds: Normal breath sounds.  Abdominal:     Palpations: Abdomen is soft.     Tenderness: There is no abdominal tenderness.     Comments: Generalized abdominal discomfort, but without focal tenderness  Musculoskeletal: Normal range of motion.  Skin:    General: Skin is warm and dry.  Neurological:     Mental Status: She is alert and oriented to person, place, and time.  Psychiatric:        Mood and Affect: Mood normal.        Behavior: Behavior normal.      ED Treatments / Results  Labs (all labs ordered are listed, but only abnormal results are displayed) Labs Reviewed  COMPREHENSIVE METABOLIC PANEL - Abnormal; Notable for the following components:      Result Value   Glucose, Bld 111 (*)    All other components within normal limits  CBC - Abnormal; Notable for the following components:   WBC 11.6 (*)    All other components within normal limits  URINALYSIS, ROUTINE W REFLEX MICROSCOPIC - Abnormal; Notable for the following components:   Hgb urine dipstick MODERATE (*)    Bacteria, UA RARE (*)    All other components within  normal limits  LIPASE, BLOOD  I-STAT BETA HCG BLOOD, ED ([facility], [facility], [facility] ONLY)    EKG None  Radiology No results found.  Procedures Procedures (including critical care time)  Medications Ordered in ED Medications  sodium chloride flush (NS) 0.9 % injection 3 mL (has no administration in time range)     Initial Impression / Assessment and Plan / ED Course  I have reviewed the triage vital signs and the nursing notes.  Pertinent labs & imaging results that were available during my care of the patient were reviewed by me and considered in my medical decision making (see chart for details).        Patient with nausea, vomiting, and diarrhea that started a day and a half ago.  Patient is very well-appearing on exam.  Vital signs are stable.  Laboratory work-up notable for mild nonspecific leukocytosis.  Abdomen is soft and nontender.  Doubt surgical or acute process.  Likely viral.  Will discharge home with PCP follow-up.  Final Clinical Impressions(s) / ED Diagnoses   Final diagnoses:  Nausea vomiting and diarrhea    ED Discharge Orders    None       Montine Circle, PA-C 07/06/19 Miquel Dunn, MD 07/06/19 [phone]

## 2019-07-07 ENCOUNTER — Encounter (INDEPENDENT_AMBULATORY_CARE_PROVIDER_SITE_OTHER): Payer: Self-pay | Admitting: Family Medicine

## 2019-07-07 ENCOUNTER — Other Ambulatory Visit (INDEPENDENT_AMBULATORY_CARE_PROVIDER_SITE_OTHER): Payer: Self-pay | Admitting: Family Medicine

## 2019-07-07 DIAGNOSIS — K5909 Other constipation: Secondary | ICD-10-CM

## 2019-07-08 MED FILL — ESCITALOPRAM 10 MG TABLET: 10 | 30 days supply | Qty: 30 | Fill #0

## 2019-07-10 NOTE — Progress Notes (Signed)
Office: [phone]  /  Fax: [phone] TeleHealth Visit:  Yvonne [name] has verbally consented to this TeleHealth visit today. The patient is located at home, the provider is located at the [employer] [facility] office. The participants in this visit include the listed provider and patient. The visit was conducted today via face time.  HPI:   Chief Complaint: OBESITY Yvonne [name] is here to discuss her progress with her obesity treatment plan. She is on the Category 3 plan and is following her eating plan approximately 80 % of the time. She states she is walking for 30 minutes 6 times per week. Yvonne [name] continue tao work on diet and weight loss. She feels Yvonne [name] has helped decrease her appetite and she is doing well. Her weight this morning was 188 lbs, but she has been sick the last 24 hours with nausea and vomiting.  We were unable to weigh the patient today for this TeleHealth visit. She feels as if she has lost weight since her last visit. She has lost 12 lbs since starting treatment with [place].  Constipation Yvonne [name] is stable on miralax, but she hasn't always drank enough H20. She requests a refill today. She denies hematochezia or melena.  Depression with Anxiety Yvonne [name] states her mood is stable on Lexapro. She is sleeping well and notes decreased irritability. She shows no sign of suicidal or homicidal ideations.  ASSESSMENT AND PLAN:  Other constipation - Plan: DISCONTINUED: polyethylene glycol powder (GLYCOLAX/MIRALAX) 17 GM/SCOOP powder  Depression with anxiety - Plan: escitalopram (LEXAPRO) 10 MG tablet  Class 1 obesity with serious comorbidity and body mass index (BMI) of 32.0 to 32.9 in adult, unspecified obesity type - Plan: Liraglutide -Weight Management (SAXENDA) 18 MG/3ML SOPN  PLAN:  Constipation [name] was informed decrease bowel movement frequency is normal while losing weight, but stools should not be hard or painful. She was advised to increase her H20 intake and  work on increasing her fiber intake. High fiber foods were discussed today. [name] agrees to continue taking miralax 17 g daily #1 bottle and we will refill for 1 month. [name] agrees to follow up with our clinic in 2 weeks.  Depression with Anxiety We discussed behavior modification techniques today to help Yvonne [name] deal with her depression and anxiety. [name] agrees to continue taking Lexapro 10 mg PO daily #30 and we will refill for 1 month. [name] agrees to follow up with our clinic in 2 weeks.  Obesity Yvonne [name] is currently in the action stage of change. As such, her goal is to continue with weight loss efforts She has agreed to follow the Category 3 plan Yvonne [name] has been instructed to work up to a goal of 150 minutes of combined cardio and strengthening exercise per week for weight loss and overall health benefits. We discussed the following Behavioral Modification Strategies today: increase H20 intake and better snacking choices We discussed various medication options to help [facility] with her weight loss efforts and we both agreed to continue Saxenda 3 mg SubQ daily #5 pens and we will refill for 1 month.  Yvonne [name] has agreed to follow up with our clinic in 2 weeks. She was informed of the importance of frequent follow up visits to maximize her success with intensive lifestyle modifications for her multiple health conditions.  ALLERGIES: No Known Allergies  MEDICATIONS: Current Outpatient Medications on File Prior to Visit  Medication Sig Dispense Refill  . Cholecalciferol (VITAMIN D3) 75 MCG (3000 UT) TABS Take 1 tablet by mouth daily. [place]  tablet   . Insulin Pen Needle (BD PEN NEEDLE NANO 2ND GEN) 32G X 4 MM MISC 1 Package by Does not apply route 2 (two) times daily. 100 each 0  . norethindrone (CAMILA) 0.35 MG tablet Take 1 tablet by mouth daily.    . ondansetron (ZOFRAN) 4 MG tablet Take 1 tablet (4 mg total) by mouth every 8 (eight) hours as needed for nausea or vomiting. 20 tablet 0    Current Facility-Administered Medications on File Prior to Visit  Medication Dose Route Frequency Provider Last Rate Last Dose  . 0.9 %  sodium chloride infusion  500 mL Intravenous Once Pyrtle, Lajuan Lines, MD        PAST MEDICAL HISTORY: Past Medical History:  Diagnosis Date  . Anemia   . Arthritis   . High blood pressure   . Joint pain   . Kidney stones   . Microscopic hematuria   . Vitamin D deficiency     PAST SURGICAL HISTORY: Past Surgical History:  Procedure Laterality Date  . NO PAST SURGERIES     Denies surgical history    SOCIAL HISTORY: Social History   Tobacco Use  . Smoking status: Never Smoker  . Smokeless tobacco: Never Used  Substance Use Topics  . Alcohol use: No  . Drug use: No    FAMILY HISTORY: Family History  Problem Relation Age of Onset  . Healthy Father   . Congestive Heart Failure Father   . Heart disease Father   . Diabetes Father   . Other Mother        deceased from multiple myelomas  . Cancer Mother   . Colon cancer Neg Hx   . Esophageal cancer Neg Hx   . Liver cancer Neg Hx   . Pancreatic cancer Neg Hx   . Rectal cancer Neg Hx   . Stomach cancer Neg Hx     ROS: Review of Systems  Constitutional: Positive for weight loss.  Gastrointestinal: Positive for constipation. Negative for melena.       Negative hematochezia  Psychiatric/Behavioral: Positive for depression. Negative for suicidal ideas.       + Anxiety    PHYSICAL EXAM: Pt in no acute distress  RECENT LABS AND TESTS: BMET    Component Value Date/Time   NA 139 07/06/2019 0159   NA 138 12/26/2018 1219   K 3.9 07/06/2019 0159   CL 107 07/06/2019 0159   CO2 23 07/06/2019 0159   GLUCOSE 111 (H) 07/06/2019 0159   BUN 13 07/06/2019 0159   BUN 15 12/26/2018 1219   CREATININE 0.89 07/06/2019 0159   CREATININE 0.85 04/28/2019 1457   CALCIUM 9.0 07/06/2019 0159   GFRNONAA >60 07/06/2019 0159   GFRAA >60 07/06/2019 0159   Lab Results  Component Value Date   HGBA1C  5.9 (H) 12/26/2018   HGBA1C 5.6 02/15/2018   HGBA1C 5.7 (H) 09/13/2017   HGBA1C 6.2 (H) 05/17/2017   HGBA1C 5.8 10/12/2014   Lab Results  Component Value Date   INSULIN 9.3 12/26/2018   INSULIN 28.8 (H) 02/15/2018   INSULIN 35.7 (H) 09/13/2017   INSULIN 27.9 (H) 05/17/2017   CBC    Component Value Date/Time   WBC 11.6 (H) 07/06/2019 0159   RBC 4.34 07/06/2019 0159   HGB 13.6 07/06/2019 0159   HGB 14.3 12/26/2018 1219   HCT 42.1 07/06/2019 0159   HCT 41.7 12/26/2018 1219   PLT 306 07/06/2019 0159   PLT 340 12/26/2018 1219   MCV  97.0 07/06/2019 0159   MCV 91 12/26/2018 1219   MCH 31.3 07/06/2019 0159   MCHC 32.3 07/06/2019 0159   RDW 13.7 07/06/2019 0159   RDW 13.2 12/26/2018 1219   LYMPHSABS 3,754 04/28/2019 1457   LYMPHSABS 2.7 12/26/2018 1219   MONOABS 0.6 03/30/2018 0744   EOSABS 156 04/28/2019 1457   EOSABS 0.1 12/26/2018 1219   BASOSABS 62 04/28/2019 1457   BASOSABS 0.1 12/26/2018 1219   Iron/TIBC/Ferritin/ %Sat No results found for: IRON, TIBC, FERRITIN, IRONPCTSAT Lipid Panel     Component Value Date/Time   CHOL 210 (H) 04/28/2019 1457   CHOL 238 (H) 12/26/2018 1219   TRIG 116 04/28/2019 1457   HDL 61 04/28/2019 1457   HDL 74 12/26/2018 1219   CHOLHDL 3.4 04/28/2019 1457   VLDL 13.2 03/30/2018 0744   LDLCALC 127 (H) 04/28/2019 1457   Hepatic Function Panel     Component Value Date/Time   PROT 8.1 07/06/2019 0159   PROT 6.7 04/28/2019 1457   ALBUMIN 4.3 07/06/2019 0159   ALBUMIN 4.7 12/26/2018 1219   AST 19 07/06/2019 0159   ALT 18 07/06/2019 0159   ALKPHOS 79 07/06/2019 0159   BILITOT 0.9 07/06/2019 0159   BILITOT 0.5 12/26/2018 1219   BILIDIR 0.1 04/28/2019 1457   IBILI 0.3 04/28/2019 1457      Component Value Date/Time   TSH 0.77 04/28/2019 1457   TSH 1.070 12/26/2018 1219   TSH 1.32 03/30/2018 0744      I, Trixie Dredge, am acting as transcriptionist for Dennard Nip, MD I have reviewed the above documentation for accuracy and  completeness, and I agree with the above. -Dennard Nip, MD

## 2019-07-10 NOTE — Progress Notes (Signed)
Office: [phone]  /  Fax: [phone] TeleHealth Visit:  Yvonne [name] has verbally consented to this TeleHealth visit today. The patient is located at work, the provider is located at the [employer] [facility] office. The participants in this visit include the listed provider and patient. The visit was conducted today via FaceTime.  HPI:   Chief Complaint: OBESITY Yvonne [name] is here to discuss her progress with her obesity treatment plan. She is on the Category 3 plan and is following her eating plan approximately 70% of the time. She states she is walking 30 minutes 7 days a week. Yvonne [name] is stable on Saxenda and feels it is helping decrease her hunger. She thinks she has maintained her weight and denies nausea, vomiting, or hypoglycemia.  We were unable to weigh the patient today for this TeleHealth visit. She feels as if she has maintained her weight since her last visit. She has lost 12 lbs since starting treatment with [place].  Pre-Diabetes Yvonne [name] has a diagnosis of prediabetes based on her elevated Hgb A1c and was informed this puts her at greater risk of developing diabetes. Her last A1c was 5.9 on 12/26/2018. She is stable on Saxenda and is working on her diet prescription and exercise to decrease risk of diabetes. She denies nausea, vomiting, or hypoglycemia.  ASSESSMENT AND PLAN:  Prediabetes  Class 1 obesity with serious comorbidity and body mass index (BMI) of 32.0 to 32.9 in adult, unspecified obesity type  PLAN:  Pre-Diabetes Yvonne [name] will continue to work on weight loss, exercise, and decreasing simple carbohydrates in her diet to help decrease the risk of diabetes. We dicussed metformin including benefits and risks. She was informed that eating too many simple carbohydrates or too many calories at one sitting increases the likelihood of GI side effects. Camary will continue [place] and diet. We will recheck her labs in 1 month.  I spent > than 50% of the 25 minute visit  on counseling as documented in the note.  Obesity Yvonne [name] is currently in the action stage of change. As such, her goal is to continue with weight loss efforts. She has agreed to follow the Category 3 plan. Yvonne [name] has been instructed to work up to a goal of 150 minutes of combined cardio and strengthening exercise per week for weight loss and overall health benefits. We discussed the following Behavioral Modification Strategies today: increasing lean protein intake and decreasing simple carbohydrates.   Helen has agreed to follow-up with our clinic in 2 weeks. She was informed of the importance of frequent follow-up visits to maximize her success with intensive lifestyle modifications for her multiple health conditions.  ALLERGIES: No Known Allergies  MEDICATIONS: Current Outpatient Medications on File Prior to Visit  Medication Sig Dispense Refill  . Cholecalciferol (VITAMIN D3) 75 MCG (3000 UT) TABS Take 1 tablet by mouth daily. 30 tablet   . Insulin Pen Needle (BD PEN NEEDLE NANO 2ND GEN) 32G X 4 MM MISC 1 Package by Does not apply route 2 (two) times daily. 100 each 0  . norethindrone (CAMILA) 0.35 MG tablet Take 1 tablet by mouth daily.     Current Facility-Administered Medications on File Prior to Visit  Medication Dose Route Frequency Provider Last Rate Last Dose  . 0.9 %  sodium chloride infusion  500 mL Intravenous Once Pyrtle, Lajuan Lines, MD        PAST MEDICAL HISTORY: Past Medical History:  Diagnosis Date  . Anemia   . Arthritis   .  High blood pressure   . Joint pain   . Kidney stones   . Microscopic hematuria   . Vitamin D deficiency     PAST SURGICAL HISTORY: Past Surgical History:  Procedure Laterality Date  . NO PAST SURGERIES     Denies surgical history    SOCIAL HISTORY: Social History   Tobacco Use  . Smoking status: Never Smoker  . Smokeless tobacco: Never Used  Substance Use Topics  . Alcohol use: No  . Drug use: No    FAMILY HISTORY: Family  History  Problem Relation Age of Onset  . Healthy Father   . Congestive Heart Failure Father   . Heart disease Father   . Diabetes Father   . Other Mother        deceased from multiple myelomas  . Cancer Mother   . Colon cancer Neg Hx   . Esophageal cancer Neg Hx   . Liver cancer Neg Hx   . Pancreatic cancer Neg Hx   . Rectal cancer Neg Hx   . Stomach cancer Neg Hx    ROS: Review of Systems  Gastrointestinal: Negative for nausea and vomiting.  Endo/Heme/Allergies:       Negative for hypoglycemia.   PHYSICAL EXAM: Pt in no acute distress  RECENT LABS AND TESTS: BMET    Component Value Date/Time   NA 139 07/06/2019 0159   NA 138 12/26/2018 1219   K 3.9 07/06/2019 0159   CL 107 07/06/2019 0159   CO2 23 07/06/2019 0159   GLUCOSE 111 (H) 07/06/2019 0159   BUN 13 07/06/2019 0159   BUN 15 12/26/2018 1219   CREATININE 0.89 07/06/2019 0159   CREATININE 0.85 04/28/2019 1457   CALCIUM 9.0 07/06/2019 0159   GFRNONAA >60 07/06/2019 0159   GFRAA >60 07/06/2019 0159   Lab Results  Component Value Date   HGBA1C 5.9 (H) 12/26/2018   HGBA1C 5.6 02/15/2018   HGBA1C 5.7 (H) 09/13/2017   HGBA1C 6.2 (H) 05/17/2017   HGBA1C 5.8 10/12/2014   Lab Results  Component Value Date   INSULIN 9.3 12/26/2018   INSULIN 28.8 (H) 02/15/2018   INSULIN 35.7 (H) 09/13/2017   INSULIN 27.9 (H) 05/17/2017   CBC    Component Value Date/Time   WBC 11.6 (H) 07/06/2019 0159   RBC 4.34 07/06/2019 0159   HGB 13.6 07/06/2019 0159   HGB 14.3 12/26/2018 1219   HCT 42.1 07/06/2019 0159   HCT 41.7 12/26/2018 1219   PLT 306 07/06/2019 0159   PLT 340 12/26/2018 1219   MCV 97.0 07/06/2019 0159   MCV 91 12/26/2018 1219   MCH 31.3 07/06/2019 0159   MCHC 32.3 07/06/2019 0159   RDW 13.7 07/06/2019 0159   RDW 13.2 12/26/2018 1219   LYMPHSABS 3,754 04/28/2019 1457   LYMPHSABS 2.7 12/26/2018 1219   MONOABS 0.6 03/30/2018 0744   EOSABS 156 04/28/2019 1457   EOSABS 0.1 12/26/2018 1219   BASOSABS 62  04/28/2019 1457   BASOSABS 0.1 12/26/2018 1219   Iron/TIBC/Ferritin/ %Sat No results found for: IRON, TIBC, FERRITIN, IRONPCTSAT Lipid Panel     Component Value Date/Time   CHOL 210 (H) 04/28/2019 1457   CHOL 238 (H) 12/26/2018 1219   TRIG 116 04/28/2019 1457   HDL 61 04/28/2019 1457   HDL 74 12/26/2018 1219   CHOLHDL 3.4 04/28/2019 1457   VLDL 13.2 03/30/2018 0744   LDLCALC 127 (H) 04/28/2019 1457   Hepatic Function Panel     Component Value Date/Time  PROT 8.1 07/06/2019 0159   PROT 6.7 04/28/2019 1457   ALBUMIN 4.3 07/06/2019 0159   ALBUMIN 4.7 12/26/2018 1219   AST 19 07/06/2019 0159   ALT 18 07/06/2019 0159   ALKPHOS 79 07/06/2019 0159   BILITOT 0.9 07/06/2019 0159   BILITOT 0.5 12/26/2018 1219   BILIDIR 0.1 04/28/2019 1457   IBILI 0.3 04/28/2019 1457      Component Value Date/Time   TSH 0.77 04/28/2019 1457   TSH 1.070 12/26/2018 1219   TSH 1.32 03/30/2018 0744   Results for REYNA, LORENZI (MRN [id number]) as of 07/10/2019 09:10  Ref. Range 12/26/2018 12:19  Vitamin D, 25-Hydroxy Latest Ref Range: 30.0 - 100.0 ng/mL 35.9   I, Michaelene Song, am acting as Location manager for Dennard Nip, MD I have reviewed the above documentation for accuracy and completeness, and I agree with the above. -Dennard Nip, MD

## 2019-07-25 ENCOUNTER — Other Ambulatory Visit: Payer: Self-pay

## 2019-07-25 ENCOUNTER — Ambulatory Visit (INDEPENDENT_AMBULATORY_CARE_PROVIDER_SITE_OTHER): Payer: 59 | Admitting: Family Medicine

## 2019-07-25 ENCOUNTER — Encounter (INDEPENDENT_AMBULATORY_CARE_PROVIDER_SITE_OTHER): Payer: Self-pay | Admitting: Family Medicine

## 2019-07-25 VITALS — BP 138/67 | HR 87 | Temp 98.3°F | Ht 65.0 in | Wt 189.0 lb

## 2019-07-25 DIAGNOSIS — Z6831 Body mass index (BMI) 31.0-31.9, adult: Secondary | ICD-10-CM | POA: Diagnosis not present

## 2019-07-25 DIAGNOSIS — K5909 Other constipation: Secondary | ICD-10-CM | POA: Diagnosis not present

## 2019-07-25 DIAGNOSIS — E669 Obesity, unspecified: Secondary | ICD-10-CM

## 2019-07-25 MED ORDER — BD PEN NEEDLE NANO 2ND GEN 32G X 4 MM MISC: 1.0000 | Freq: Two times a day (BID) | 0 refills | Status: DC

## 2019-07-25 MED ORDER — SAXENDA 18 MG/3ML ~~LOC~~ SOPN: 3.0000 mg | PEN_INJECTOR | Freq: Every day | SUBCUTANEOUS | 0 refills | Status: DC

## 2019-07-26 MED FILL — UNIFINE PENTIPS 32GX5/32: 32G X 4 MM | 50 days supply | Qty: 100 | Fill #0

## 2019-07-27 NOTE — Progress Notes (Signed)
Office: [phone]  /  Fax: [phone]   HPI:   Chief Complaint: OBESITY Yvonne [name] is here to discuss her progress with her obesity treatment plan. She is on the Category 3 plan and is following her eating plan approximately 80% of the time. She states she is walking 30 minutes 5 times per week. Yvonne [name] continues to do well with weight loss on her plan. She has some GI upset with Saxenda when she makes poor food choices.  Her weight is 189 lb (85.7 kg) today and has had a weight loss of 3 pounds over a period of 2 months since her last in-office visit. She has lost 15 lbs since starting treatment with [place].  Constipation Yvonne [name] improved with MiraLax. Her bowel movements are still less frequent but no longer hard or painful. She does belch a bit more, but this is more likely due to [place].  ASSESSMENT AND PLAN:  Other constipation  Class 1 obesity with serious comorbidity and body mass index (BMI) of 31.0 to 31.9 in adult, unspecified obesity type - Plan: Liraglutide -Weight Management (SAXENDA) 18 MG/3ML SOPN, Insulin Pen Needle (BD PEN NEEDLE NANO 2ND GEN) 32G X 4 MM MISC  PLAN:  Constipation Yvonne [name] will continue MiraLax OTC and increase her water intake. She will follow-up with our clinic as directed to monitor her progress.    Obesity Yvonne [name] is currently in the action stage of change. As such, her goal is to continue with weight loss efforts. She has agreed to follow the Category 3 plan. Yvonne [name] has been instructed to work up to a goal of 150 minutes of combined cardio and strengthening exercise per week for weight loss and overall health benefits. We discussed the following Behavioral Modification Strategies today: increasing lean protein intake and decreasing simple carbohydrates.   Yvonne [name] was given a refill on her Saxenda 3 mg #5 pens with 0 refills and a refill on her nano needles #100 with 0 refills. She agrees to follow-up with our clinic in 2-3 weeks.  Yvonne [name] has agreed  to follow-up with our clinic in 2-3 weeks. She was informed of the importance of frequent follow-up visits to maximize her success with intensive lifestyle modifications for her multiple health conditions.  I spent > than 50% of the 25 minute visit on counseling as documented in the note.  ALLERGIES: No Known Allergies  MEDICATIONS: Current Outpatient Medications on File Prior to Visit  Medication Sig Dispense Refill  . Cholecalciferol (VITAMIN D3) 75 MCG (3000 UT) TABS Take 1 tablet by mouth daily. 30 tablet   . escitalopram (LEXAPRO) 10 MG tablet Take 1 tablet (10 mg total) by mouth daily. 30 tablet 0  . norethindrone (CAMILA) 0.35 MG tablet Take 1 tablet by mouth daily.    . ondansetron (ZOFRAN) 4 MG tablet Take 1 tablet (4 mg total) by mouth every 8 (eight) hours as needed for nausea or vomiting. 20 tablet 0  . polyethylene glycol powder (GLYCOLAX/MIRALAX) 17 GM/SCOOP powder DISSOLVE 8.5 TO 17 GM (SEE FILL LINE.) MIX IN LIQUID AND TAKE BY MOUTH DAILY 3350 g 0   Current Facility-Administered Medications on File Prior to Visit  Medication Dose Route Frequency Provider Last Rate Last Dose  . 0.9 %  sodium chloride infusion  500 mL Intravenous Once Pyrtle, Lajuan Lines, MD        PAST MEDICAL HISTORY: Past Medical History:  Diagnosis Date  . Anemia   . Arthritis   . High blood pressure   . Joint pain   .  Kidney stones   . Microscopic hematuria   . Vitamin D deficiency     PAST SURGICAL HISTORY: Past Surgical History:  Procedure Laterality Date  . NO PAST SURGERIES     Denies surgical history    SOCIAL HISTORY: Social History   Tobacco Use  . Smoking status: Never Smoker  . Smokeless tobacco: Never Used  Substance Use Topics  . Alcohol use: No  . Drug use: No    FAMILY HISTORY: Family History  Problem Relation Age of Onset  . Healthy Father   . Congestive Heart Failure Father   . Heart disease Father   . Diabetes Father   . Other Mother        deceased from multiple  myelomas  . Cancer Mother   . Colon cancer Neg Hx   . Esophageal cancer Neg Hx   . Liver cancer Neg Hx   . Pancreatic cancer Neg Hx   . Rectal cancer Neg Hx   . Stomach cancer Neg Hx    ROS: Review of Systems  Gastrointestinal: Positive for constipation.   PHYSICAL EXAM: Blood pressure 138/67, pulse 87, temperature 98.3 F (36.8 C), temperature source Oral, height 5\' 5"  (1.651 m), weight 189 lb (85.7 kg), SpO2 94 %. Body mass index is 31.45 kg/m. Physical Exam Vitals signs reviewed.  Constitutional:      Appearance: Normal appearance. She is obese.  Cardiovascular:     Rate and Rhythm: Normal rate.     Pulses: Normal pulses.  Pulmonary:     Effort: Pulmonary effort is normal.     Breath sounds: Normal breath sounds.  Musculoskeletal: Normal range of motion.  Skin:    General: Skin is warm and dry.  Neurological:     Mental Status: She is alert and oriented to person, place, and time.  Psychiatric:        Behavior: Behavior normal.   RECENT LABS AND TESTS: BMET    Component Value Date/Time   NA 139 07/06/2019 0159   NA 138 12/26/2018 1219   K 3.9 07/06/2019 0159   CL 107 07/06/2019 0159   CO2 23 07/06/2019 0159   GLUCOSE 111 (H) 07/06/2019 0159   BUN 13 07/06/2019 0159   BUN 15 12/26/2018 1219   CREATININE 0.89 07/06/2019 0159   CREATININE 0.85 04/28/2019 1457   CALCIUM 9.0 07/06/2019 0159   GFRNONAA >60 07/06/2019 0159   GFRAA >60 07/06/2019 0159   Lab Results  Component Value Date   HGBA1C 5.9 (H) 12/26/2018   HGBA1C 5.6 02/15/2018   HGBA1C 5.7 (H) 09/13/2017   HGBA1C 6.2 (H) 05/17/2017   HGBA1C 5.8 10/12/2014   Lab Results  Component Value Date   INSULIN 9.3 12/26/2018   INSULIN 28.8 (H) 02/15/2018   INSULIN 35.7 (H) 09/13/2017   INSULIN 27.9 (H) 05/17/2017   CBC    Component Value Date/Time   WBC 11.6 (H) 07/06/2019 0159   RBC 4.34 07/06/2019 0159   HGB 13.6 07/06/2019 0159   HGB 14.3 12/26/2018 1219   HCT 42.1 07/06/2019 0159   HCT 41.7  12/26/2018 1219   PLT 306 07/06/2019 0159   PLT 340 12/26/2018 1219   MCV 97.0 07/06/2019 0159   MCV 91 12/26/2018 1219   MCH 31.3 07/06/2019 0159   MCHC 32.3 07/06/2019 0159   RDW 13.7 07/06/2019 0159   RDW 13.2 12/26/2018 1219   LYMPHSABS 3,754 04/28/2019 1457   LYMPHSABS 2.7 12/26/2018 1219   MONOABS 0.6 03/30/2018 0744   EOSABS  156 04/28/2019 1457   EOSABS 0.1 12/26/2018 1219   BASOSABS 62 04/28/2019 1457   BASOSABS 0.1 12/26/2018 1219   Iron/TIBC/Ferritin/ %Sat No results found for: IRON, TIBC, FERRITIN, IRONPCTSAT Lipid Panel     Component Value Date/Time   CHOL 210 (H) 04/28/2019 1457   CHOL 238 (H) 12/26/2018 1219   TRIG 116 04/28/2019 1457   HDL 61 04/28/2019 1457   HDL 74 12/26/2018 1219   CHOLHDL 3.4 04/28/2019 1457   VLDL 13.2 03/30/2018 0744   LDLCALC 127 (H) 04/28/2019 1457   Hepatic Function Panel     Component Value Date/Time   PROT 8.1 07/06/2019 0159   PROT 6.7 04/28/2019 1457   ALBUMIN 4.3 07/06/2019 0159   ALBUMIN 4.7 12/26/2018 1219   AST 19 07/06/2019 0159   ALT 18 07/06/2019 0159   ALKPHOS 79 07/06/2019 0159   BILITOT 0.9 07/06/2019 0159   BILITOT 0.5 12/26/2018 1219   BILIDIR 0.1 04/28/2019 1457   IBILI 0.3 04/28/2019 1457      Component Value Date/Time   TSH 0.77 04/28/2019 1457   TSH 1.070 12/26/2018 1219   TSH 1.32 03/30/2018 0744   Results for DIAMOND, JENTZ (MRN [id number]) as of 07/27/2019 09:24  Ref. Range 12/26/2018 12:19  Vitamin D, 25-Hydroxy Latest Ref Range: 30.0 - 100.0 ng/mL 35.9   OBESITY BEHAVIORAL INTERVENTION VISIT  Today's visit was #43   Starting weight: 204 lbs Starting date: 05/17/2017 Today's weight: 189 lbs  Today's date: 07/25/2019 Total lbs lost to date: 15   07/25/2019  Height 5\' 5"  (1.651 m)  Weight 189 lb (85.7 kg)  BMI (Calculated) 31.45  BLOOD PRESSURE - SYSTOLIC 774  BLOOD PRESSURE - DIASTOLIC 67   Body Fat % 39 %  Total Body Water (lbs) 77.6 lbs   ASK: We discussed the diagnosis of  obesity with Yvonne [name] today and Yvonne [name] agreed to give [place] permission to discuss obesity behavioral modification therapy today.  ASSESS: Yvonne [name] has the diagnosis of obesity and her BMI today is 31.6. Yvonne [name] is in the action stage of change.   ADVISE: Yvonne [name] was educated on the multiple health risks of obesity as well as the benefit of weight loss to improve her health. She was advised of the need for long term treatment and the importance of lifestyle modifications to improve her current health and to decrease her risk of future health problems.  AGREE: Multiple dietary modification options and treatment options were discussed and  Yvonne [name] agreed to follow the recommendations documented in the above note.  ARRANGE: Yvonne [name] was educated on the importance of frequent visits to treat obesity as outlined per CMS and USPSTF guidelines and agreed to schedule her next follow up appointment today.  I, Michaelene Song, am acting as Location manager for Dennard Nip, MD  I have reviewed the above documentation for accuracy and completeness, and I agree with the above. -Dennard Nip, MD

## 2019-08-09 ENCOUNTER — Telehealth (INDEPENDENT_AMBULATORY_CARE_PROVIDER_SITE_OTHER): Payer: 59 | Admitting: Family Medicine

## 2019-08-09 ENCOUNTER — Encounter (INDEPENDENT_AMBULATORY_CARE_PROVIDER_SITE_OTHER): Payer: Self-pay | Admitting: Family Medicine

## 2019-08-09 ENCOUNTER — Other Ambulatory Visit: Payer: Self-pay

## 2019-08-09 DIAGNOSIS — K529 Noninfective gastroenteritis and colitis, unspecified: Secondary | ICD-10-CM | POA: Diagnosis not present

## 2019-08-09 DIAGNOSIS — E669 Obesity, unspecified: Secondary | ICD-10-CM

## 2019-08-09 DIAGNOSIS — Z6831 Body mass index (BMI) 31.0-31.9, adult: Secondary | ICD-10-CM | POA: Diagnosis not present

## 2019-08-09 NOTE — Telephone Encounter (Signed)
Please advise 

## 2019-08-11 ENCOUNTER — Ambulatory Visit: Payer: 59 | Admitting: Family

## 2019-08-11 MED FILL — ESCITALOPRAM 10 MG TABLET: 10 | 30 days supply | Qty: 30 | Fill #0

## 2019-08-15 NOTE — Progress Notes (Signed)
Office: [phone]  /  Fax: [phone] TeleHealth Visit:  Yvonne VANBERGEN has verbally consented to this TeleHealth visit today. The patient is located at work, the provider is located at the [employer] [facility] office. The participants in this visit include the listed provider and patient. The visit was conducted today via FaceTime.  HPI:   Chief Complaint: OBESITY Yvonne [name] is here to discuss her progress with her obesity treatment plan. She is on the Category 3 plan and is following her eating plan approximately 85% of the time. She states she is walking 30 minutes 7 times per week. Yvonne [name] has done well with her eating plan overall except for 4 days ago when she started having intermittent gassiness and GI upset. She stopped her Saxenda at that point and has felt better the last 2 days.  We were unable to weigh the patient today for this TeleHealth visit. She feels as if she has maintained her weight since her last visit. She has lost 15 lbs since starting treatment with [place].  Gastroenteritis Blanch had 3 days of intermittent nausea, vomiting, and diarrhea the end of last week and stopped her Saxenda. Symptoms improved but she has tolerated a very low dose of Saxenda in the past.  ASSESSMENT AND PLAN:  Gastroenteritis  Class 1 obesity with serious comorbidity and body mass index (BMI) of 31.0 to 31.9 in adult, unspecified obesity type  PLAN:  Gastroenteritis Yvonne [name] is to stop Saxenda x4 more days and then if she continues to feel well she can restart Saxenda at 1/2 dose (5 clicks past 0 or 0.3 mg). Will continue to follow closely. There are no signs of pancreatitis, but if GI symptoms return Yvonne [name] will stop Saxenda completely.  I spent > than 50% of the 25 minute visit on counseling as documented in the note.  Obesity Yvonne [name] is currently in the action stage of change. As such, her goal is to continue with weight loss efforts. She has agreed to follow the Category 3 plan.  Yvonne [name] has been instructed to work up to a goal of 150 minutes of combined cardio and strengthening exercise per week for weight loss and overall health benefits. We discussed the following Behavioral Modification Strategies today: better snacking choices.  [name] will hold Saxenda another 3-4 days and then it will be okay to start back at 0.3 mg dose. Will monitor closely.  Yvonne [name] has agreed to follow-up with our clinic in 3 weeks. She was informed of the importance of frequent follow-up visits to maximize her success with intensive lifestyle modifications for her multiple health conditions.  ALLERGIES: No Known Allergies  MEDICATIONS: Current Outpatient Medications on File Prior to Visit  Medication Sig Dispense Refill  . Cholecalciferol (VITAMIN D3) 75 MCG (3000 UT) TABS Take 1 tablet by mouth daily. 30 tablet   . escitalopram (LEXAPRO) 10 MG tablet Take 1 tablet (10 mg total) by mouth daily. 30 tablet 0  . Insulin Pen Needle (BD PEN NEEDLE NANO 2ND GEN) 32G X 4 MM MISC 1 Package by Does not apply route 2 (two) times daily. 100 each 0  . Liraglutide -Weight Management (SAXENDA) 18 MG/3ML SOPN Inject 3 mg into the skin daily. 5 pen 0  . norethindrone (CAMILA) 0.35 MG tablet Take 1 tablet by mouth daily.    . ondansetron (ZOFRAN) 4 MG tablet Take 1 tablet (4 mg total) by mouth every 8 (eight) hours as needed for nausea or vomiting. 20 tablet 0  . polyethylene glycol powder (  GLYCOLAX/MIRALAX) 17 GM/SCOOP powder DISSOLVE 8.5 TO 17 GM (SEE FILL LINE.) MIX IN LIQUID AND TAKE BY MOUTH DAILY 3350 g 0   Current Facility-Administered Medications on File Prior to Visit  Medication Dose Route Frequency Provider Last Rate Last Dose  . 0.9 %  sodium chloride infusion  500 mL Intravenous Once Pyrtle, Lajuan Lines, MD        PAST MEDICAL HISTORY: Past Medical History:  Diagnosis Date  . Anemia   . Arthritis   . High blood pressure   . Joint pain   . Kidney stones   . Microscopic hematuria   . Vitamin  D deficiency     PAST SURGICAL HISTORY: Past Surgical History:  Procedure Laterality Date  . NO PAST SURGERIES     Denies surgical history    SOCIAL HISTORY: Social History   Tobacco Use  . Smoking status: Never Smoker  . Smokeless tobacco: Never Used  Substance Use Topics  . Alcohol use: No  . Drug use: No    FAMILY HISTORY: Family History  Problem Relation Age of Onset  . Healthy Father   . Congestive Heart Failure Father   . Heart disease Father   . Diabetes Father   . Other Mother        deceased from multiple myelomas  . Cancer Mother   . Colon cancer Neg Hx   . Esophageal cancer Neg Hx   . Liver cancer Neg Hx   . Pancreatic cancer Neg Hx   . Rectal cancer Neg Hx   . Stomach cancer Neg Hx    ROS: Review of Systems  Gastrointestinal:       Positive for gastroenteritis.   PHYSICAL EXAM: Pt in no acute distress  RECENT LABS AND TESTS: BMET    Component Value Date/Time   NA 139 07/06/2019 0159   NA 138 12/26/2018 1219   K 3.9 07/06/2019 0159   CL 107 07/06/2019 0159   CO2 23 07/06/2019 0159   GLUCOSE 111 (H) 07/06/2019 0159   BUN 13 07/06/2019 0159   BUN 15 12/26/2018 1219   CREATININE 0.89 07/06/2019 0159   CREATININE 0.85 04/28/2019 1457   CALCIUM 9.0 07/06/2019 0159   GFRNONAA >60 07/06/2019 0159   GFRAA >60 07/06/2019 0159   Lab Results  Component Value Date   HGBA1C 5.9 (H) 12/26/2018   HGBA1C 5.6 02/15/2018   HGBA1C 5.7 (H) 09/13/2017   HGBA1C 6.2 (H) 05/17/2017   HGBA1C 5.8 10/12/2014   Lab Results  Component Value Date   INSULIN 9.3 12/26/2018   INSULIN 28.8 (H) 02/15/2018   INSULIN 35.7 (H) 09/13/2017   INSULIN 27.9 (H) 05/17/2017   CBC    Component Value Date/Time   WBC 11.6 (H) 07/06/2019 0159   RBC 4.34 07/06/2019 0159   HGB 13.6 07/06/2019 0159   HGB 14.3 12/26/2018 1219   HCT 42.1 07/06/2019 0159   HCT 41.7 12/26/2018 1219   PLT 306 07/06/2019 0159   PLT 340 12/26/2018 1219   MCV 97.0 07/06/2019 0159   MCV 91  12/26/2018 1219   MCH 31.3 07/06/2019 0159   MCHC 32.3 07/06/2019 0159   RDW 13.7 07/06/2019 0159   RDW 13.2 12/26/2018 1219   LYMPHSABS 3,754 04/28/2019 1457   LYMPHSABS 2.7 12/26/2018 1219   MONOABS 0.6 03/30/2018 0744   EOSABS 156 04/28/2019 1457   EOSABS 0.1 12/26/2018 1219   BASOSABS 62 04/28/2019 1457   BASOSABS 0.1 12/26/2018 1219   Iron/TIBC/Ferritin/ %Sat No results found  for: IRON, TIBC, FERRITIN, IRONPCTSAT Lipid Panel     Component Value Date/Time   CHOL 210 (H) 04/28/2019 1457   CHOL 238 (H) 12/26/2018 1219   TRIG 116 04/28/2019 1457   HDL 61 04/28/2019 1457   HDL 74 12/26/2018 1219   CHOLHDL 3.4 04/28/2019 1457   VLDL 13.2 03/30/2018 0744   LDLCALC 127 (H) 04/28/2019 1457   Hepatic Function Panel     Component Value Date/Time   PROT 8.1 07/06/2019 0159   PROT 6.7 04/28/2019 1457   ALBUMIN 4.3 07/06/2019 0159   ALBUMIN 4.7 12/26/2018 1219   AST 19 07/06/2019 0159   ALT 18 07/06/2019 0159   ALKPHOS 79 07/06/2019 0159   BILITOT 0.9 07/06/2019 0159   BILITOT 0.5 12/26/2018 1219   BILIDIR 0.1 04/28/2019 1457   IBILI 0.3 04/28/2019 1457      Component Value Date/Time   TSH 0.77 04/28/2019 1457   TSH 1.070 12/26/2018 1219   TSH 1.32 03/30/2018 0744   Results for JONTIA, PRINS (MRN [id number]) as of 08/15/2019 08:03  Ref. Range 12/26/2018 12:19  Vitamin D, 25-Hydroxy Latest Ref Range: 30.0 - 100.0 ng/mL 35.9   I, Michaelene Song, am acting as Location manager for Dennard Nip, MD I have reviewed the above documentation for accuracy and completeness, and I agree with the above. -Dennard Nip, MD

## 2019-08-16 ENCOUNTER — Telehealth (INDEPENDENT_AMBULATORY_CARE_PROVIDER_SITE_OTHER): Payer: 59 | Admitting: Family Medicine

## 2019-08-24 MED FILL — JUNEL 1-20 TABLET: 1-20 | 84 days supply | Qty: 84 | Fill #0

## 2019-08-30 ENCOUNTER — Other Ambulatory Visit: Payer: Self-pay

## 2019-08-30 ENCOUNTER — Encounter (INDEPENDENT_AMBULATORY_CARE_PROVIDER_SITE_OTHER): Payer: Self-pay | Admitting: Family Medicine

## 2019-08-30 ENCOUNTER — Telehealth (INDEPENDENT_AMBULATORY_CARE_PROVIDER_SITE_OTHER): Payer: 59 | Admitting: Family Medicine

## 2019-08-30 DIAGNOSIS — Z6831 Body mass index (BMI) 31.0-31.9, adult: Secondary | ICD-10-CM | POA: Diagnosis not present

## 2019-08-30 DIAGNOSIS — E669 Obesity, unspecified: Secondary | ICD-10-CM | POA: Diagnosis not present

## 2019-08-30 DIAGNOSIS — R7303 Prediabetes: Secondary | ICD-10-CM

## 2019-08-30 DIAGNOSIS — F418 Other specified anxiety disorders: Secondary | ICD-10-CM

## 2019-08-30 MED ORDER — RYBELSUS 3 MG PO TABS: 1.0000 | ORAL_TABLET | Freq: Every day | ORAL | 0 refills | Status: DC

## 2019-08-30 MED ORDER — ESCITALOPRAM OXALATE 10 MG PO TABS: 10.0000 mg | ORAL_TABLET | Freq: Every day | ORAL | 0 refills | Status: DC

## 2019-08-30 MED FILL — RYBELSUS 3 MG TABS: 3 | 30 days supply | Qty: 30 | Fill #0

## 2019-09-04 MED FILL — ESCITALOPRAM 10 MG TABLET: 10 | 30 days supply | Qty: 30 | Fill #0

## 2019-09-04 NOTE — Progress Notes (Signed)
Office: [phone]  /  Fax: [phone] TeleHealth Visit:  Yvonne [name] has verbally consented to this TeleHealth visit today. The patient is located at work, the provider is located at the [employer] [facility] office. The participants in this visit include the listed provider and patient. The visit was conducted today via FaceTime.  HPI:   Chief Complaint: OBESITY Yvonne [name] is here to discuss her progress with her obesity treatment plan. She is on the Category 3 plan and is following her eating plan approximately 50% of the time. She states she is walking 30 minutes 5 times per week. Yvonne [name] did not tolerate Saxenda even at small doses so she stopped [place] and feels much better. She has gained 3-4 lbs, though. We were unable to weigh the patient today for this TeleHealth visit. She feels as if she has gained weight since her last visit. She has lost 15 lbs since starting treatment with [place].  Pre-Diabetes Yvonne [name] has a diagnosis of prediabetes based on her elevated Hgb A1c and was informed this puts her at greater risk of developing diabetes. She was on liraglutide and had GI upset. It helped with polyphagia and weight loss, though. She continues to work on diet and exercise to decrease risk of diabetes. She denies nausea or hypoglycemia.  Depression with Anxiety Yvonne [name] is struggling with emotional eating and using food for comfort to the extent that it is negatively impacting her health. She often snacks when she is not hungry. Yvonne [name] sometimes feels she is out of control and then feels guilty that she made poor food choices. She has been working on behavior modification techniques to help reduce her emotional eating and has been somewhat successful. Yvonne [name] feels the Lexapro has helped her mood significantly and has decreased irritability. She shows no sign of suicidal or homicidal ideations.  Depression screen [facility] 2/9 06/06/2018 05/17/2017 04/02/2017  Decreased Interest 0 3 0  Down,  Depressed, Hopeless 0 0 0  PHQ - 2 Score 0 3 0  Altered sleeping 1 3 0  Tired, decreased energy 1 3 0  Change in appetite 0 0 1  Feeling bad or failure about yourself  0 0 0  Trouble concentrating 3 0 0  Moving slowly or fidgety/restless 0 0 0  Suicidal thoughts 0 0 0  PHQ-9 Score 5 9 1    ASSESSMENT AND PLAN:  Prediabetes - Plan: Semaglutide (RYBELSUS) 3 MG TABS  Depression with anxiety - Plan: escitalopram (LEXAPRO) 10 MG tablet  Class 1 obesity with serious comorbidity and body mass index (BMI) of 31.0 to 31.9 in adult, unspecified obesity type  PLAN:  Pre-Diabetes Yvonne [name] will continue to work on weight loss, exercise, and decreasing simple carbohydrates in her diet to help decrease the risk of diabetes. We dicussed metformin including benefits and risks. She was informed that eating too many simple carbohydrates or too many calories at one sitting increases the likelihood of GI side effects. [name] will try low dose Rybelsus 3 mg QAM and see if she tolerates this better. She agrees to follow-up with our clinic in 2-3 weeks.  Depression with Anxiety We discussed behavior modification techniques today to help Yvonne [name] deal with her emotional eating and depression. Yvonne [name] was given a refill on her Lexapro 10 mg #30 with 0 refills and agrees to follow-up with our clinic in 2-3 weeks.   Obesity Yvonne [name] is currently in the action stage of change. As such, her goal is to continue with weight loss efforts. She has agreed  to follow the Category 3 plan. Yvonne [name] has been instructed to work up to a goal of 150 minutes of combined cardio and strengthening exercise per week for weight loss and overall health benefits. We discussed the following Behavioral Modification Strategies today: no skipping meals and work on meal planning and easy cooking plans.  Yvonne [name] has agreed to follow-up with our clinic in 2-3 weeks. She was informed of the importance of frequent follow-up visits to maximize her  success with intensive lifestyle modifications for her multiple health conditions.  ALLERGIES: No Known Allergies  MEDICATIONS: Current Outpatient Medications on File Prior to Visit  Medication Sig Dispense Refill  . Cholecalciferol (VITAMIN D3) 75 MCG (3000 UT) TABS Take 1 tablet by mouth daily. 30 tablet   . norethindrone-ethinyl estradiol (LOESTRIN FE) 1-20 MG-MCG tablet Take 1 tablet by mouth daily.    . ondansetron (ZOFRAN) 4 MG tablet Take 1 tablet (4 mg total) by mouth every 8 (eight) hours as needed for nausea or vomiting. 20 tablet 0  . polyethylene glycol powder (GLYCOLAX/MIRALAX) 17 GM/SCOOP powder DISSOLVE 8.5 TO 17 GM (SEE FILL LINE.) MIX IN LIQUID AND TAKE BY MOUTH DAILY 3350 g 0   Current Facility-Administered Medications on File Prior to Visit  Medication Dose Route Frequency Provider Last Rate Last Dose  . 0.9 %  sodium chloride infusion  500 mL Intravenous Once Pyrtle, Lajuan Lines, MD        PAST MEDICAL HISTORY: Past Medical History:  Diagnosis Date  . Anemia   . Arthritis   . High blood pressure   . Joint pain   . Kidney stones   . Microscopic hematuria   . Vitamin D deficiency     PAST SURGICAL HISTORY: Past Surgical History:  Procedure Laterality Date  . NO PAST SURGERIES     Denies surgical history    SOCIAL HISTORY: Social History   Tobacco Use  . Smoking status: Never Smoker  . Smokeless tobacco: Never Used  Substance Use Topics  . Alcohol use: No  . Drug use: No    FAMILY HISTORY: Family History  Problem Relation Age of Onset  . Healthy Father   . Congestive Heart Failure Father   . Heart disease Father   . Diabetes Father   . Other Mother        deceased from multiple myelomas  . Cancer Mother   . Colon cancer Neg Hx   . Esophageal cancer Neg Hx   . Liver cancer Neg Hx   . Pancreatic cancer Neg Hx   . Rectal cancer Neg Hx   . Stomach cancer Neg Hx    ROS: Review of Systems  Gastrointestinal: Negative for nausea.   Endo/Heme/Allergies:       Negative for hypoglycemia.  Psychiatric/Behavioral: Positive for depression. Negative for suicidal ideas. The patient is nervous/anxious.        Negative for homicidal ideas.   PHYSICAL EXAM: Pt in no acute distress  RECENT LABS AND TESTS: BMET    Component Value Date/Time   NA 139 07/06/2019 0159   NA 138 12/26/2018 1219   K 3.9 07/06/2019 0159   CL 107 07/06/2019 0159   CO2 23 07/06/2019 0159   GLUCOSE 111 (H) 07/06/2019 0159   BUN 13 07/06/2019 0159   BUN 15 12/26/2018 1219   CREATININE 0.89 07/06/2019 0159   CREATININE 0.85 04/28/2019 1457   CALCIUM 9.0 07/06/2019 0159   GFRNONAA >60 07/06/2019 0159   GFRAA >60 07/06/2019 0159   Lab  Results  Component Value Date   HGBA1C 5.9 (H) 12/26/2018   HGBA1C 5.6 02/15/2018   HGBA1C 5.7 (H) 09/13/2017   HGBA1C 6.2 (H) 05/17/2017   HGBA1C 5.8 10/12/2014   Lab Results  Component Value Date   INSULIN 9.3 12/26/2018   INSULIN 28.8 (H) 02/15/2018   INSULIN 35.7 (H) 09/13/2017   INSULIN 27.9 (H) 05/17/2017   CBC    Component Value Date/Time   WBC 11.6 (H) 07/06/2019 0159   RBC 4.34 07/06/2019 0159   HGB 13.6 07/06/2019 0159   HGB 14.3 12/26/2018 1219   HCT 42.1 07/06/2019 0159   HCT 41.7 12/26/2018 1219   PLT 306 07/06/2019 0159   PLT 340 12/26/2018 1219   MCV 97.0 07/06/2019 0159   MCV 91 12/26/2018 1219   MCH 31.3 07/06/2019 0159   MCHC 32.3 07/06/2019 0159   RDW 13.7 07/06/2019 0159   RDW 13.2 12/26/2018 1219   LYMPHSABS 3,754 04/28/2019 1457   LYMPHSABS 2.7 12/26/2018 1219   MONOABS 0.6 03/30/2018 0744   EOSABS 156 04/28/2019 1457   EOSABS 0.1 12/26/2018 1219   BASOSABS 62 04/28/2019 1457   BASOSABS 0.1 12/26/2018 1219   Iron/TIBC/Ferritin/ %Sat No results found for: IRON, TIBC, FERRITIN, IRONPCTSAT Lipid Panel     Component Value Date/Time   CHOL 210 (H) 04/28/2019 1457   CHOL 238 (H) 12/26/2018 1219   TRIG 116 04/28/2019 1457   HDL 61 04/28/2019 1457   HDL 74 12/26/2018  1219   CHOLHDL 3.4 04/28/2019 1457   VLDL 13.2 03/30/2018 0744   LDLCALC 127 (H) 04/28/2019 1457   Hepatic Function Panel     Component Value Date/Time   PROT 8.1 07/06/2019 0159   PROT 6.7 04/28/2019 1457   ALBUMIN 4.3 07/06/2019 0159   ALBUMIN 4.7 12/26/2018 1219   AST 19 07/06/2019 0159   ALT 18 07/06/2019 0159   ALKPHOS 79 07/06/2019 0159   BILITOT 0.9 07/06/2019 0159   BILITOT 0.5 12/26/2018 1219   BILIDIR 0.1 04/28/2019 1457   IBILI 0.3 04/28/2019 1457      Component Value Date/Time   TSH 0.77 04/28/2019 1457   TSH 1.070 12/26/2018 1219   TSH 1.32 03/30/2018 0744   Results for AYLINA, CHIZMAR (MRN [id number]) as of 09/04/2019 07:24  Ref. Range 12/26/2018 12:19  Vitamin D, 25-Hydroxy Latest Ref Range: 30.0 - 100.0 ng/mL 35.9   I, Michaelene Song, am acting as Location manager for Dennard Nip, MD I have reviewed the above documentation for accuracy and completeness, and I agree with the above. -Dennard Nip, MD

## 2019-09-13 ENCOUNTER — Telehealth (INDEPENDENT_AMBULATORY_CARE_PROVIDER_SITE_OTHER): Payer: 59 | Admitting: Family Medicine

## 2019-09-21 ENCOUNTER — Other Ambulatory Visit: Payer: Self-pay

## 2019-09-21 ENCOUNTER — Telehealth (INDEPENDENT_AMBULATORY_CARE_PROVIDER_SITE_OTHER): Payer: 59 | Admitting: Family Medicine

## 2019-09-21 ENCOUNTER — Encounter (INDEPENDENT_AMBULATORY_CARE_PROVIDER_SITE_OTHER): Payer: Self-pay | Admitting: Family Medicine

## 2019-09-21 DIAGNOSIS — Z6831 Body mass index (BMI) 31.0-31.9, adult: Secondary | ICD-10-CM | POA: Diagnosis not present

## 2019-09-21 DIAGNOSIS — F3289 Other specified depressive episodes: Secondary | ICD-10-CM

## 2019-09-21 DIAGNOSIS — R7303 Prediabetes: Secondary | ICD-10-CM

## 2019-09-21 DIAGNOSIS — E669 Obesity, unspecified: Secondary | ICD-10-CM | POA: Diagnosis not present

## 2019-09-21 MED ORDER — ESCITALOPRAM OXALATE 10 MG PO TABS: 10.0000 mg | ORAL_TABLET | Freq: Every day | ORAL | 0 refills | Status: DC

## 2019-09-21 MED ORDER — RYBELSUS 3 MG PO TABS: 1.0000 | ORAL_TABLET | Freq: Every day | ORAL | 0 refills | Status: DC

## 2019-09-25 MED FILL — RYBELSUS 3 MG TABS: 3 | 30 days supply | Qty: 30 | Fill #0

## 2019-09-27 NOTE — Progress Notes (Signed)
Office: [phone]  /  Fax: [phone] TeleHealth Visit:  Yvonne [name] has verbally consented to this TeleHealth visit today. The patient is located at home, the provider is located at the [employer] [facility] office. The participants in this visit include the listed provider and patient. Yvonne [name] was unable to use realtime audiovisual technology today and the telehealth visit was conducted via telephone.   HPI:   Chief Complaint: OBESITY Yvonne [name] is here to discuss her progress with her obesity treatment plan. She is on the Category 3 plan and is following her eating plan approximately 80 % of the time. She states she is walking for 30 minutes 5 times per week. [name] feels she is doing well with weight loss, and she thinks she has lost 3 lbs since her last visit. She states her hunger is better controlled and she is doing well with avoiding temptations.  We were unable to weigh the patient today for this TeleHealth visit. She feels as if she has lost 3 lbs since her last visit. She has lost 15 lbs since starting treatment with [place].  Pre-Diabetes Yvonne [name] has a diagnosis of pre-diabetes based on her elevated Hgb A1c and was informed this puts her at greater risk of developing diabetes. She started Rybelsus and is tolerating it well without the side effects she had with liraglitide. She notes decreased polyphagia. She continues to work on diet and exercise to decrease risk of diabetes. She denies nausea or hypoglycemia.  Depression with Emotional Eating Behaviors Yvonne [name]'s mood is stable on Lexapro. She is sleeping well and she is doing better at controlling her emotional eating. Yvonne [name] struggles with emotional eating and using food for comfort to the extent that it is negatively impacting her health. She often snacks when she is not hungry. Yvonne [name] sometimes feels she is out of control and then feels guilty that she made poor food choices. She has been working on behavior modification  techniques to help reduce her emotional eating and has been somewhat successful. She shows no sign of suicidal or homicidal ideations.  Depression screen Yvonne [name] [name] 2/9 06/06/2018 05/17/2017 04/02/2017  Decreased Interest 0 3 0  Down, Depressed, Hopeless 0 0 0  PHQ - 2 Score 0 3 0  Altered sleeping 1 3 0  Tired, decreased energy 1 3 0  Change in appetite 0 0 1  Feeling bad or failure about yourself  0 0 0  Trouble concentrating 3 0 0  Moving slowly or fidgety/restless 0 0 0  Suicidal thoughts 0 0 0  PHQ-9 Score 5 9 1     ASSESSMENT AND PLAN:  Prediabetes - Plan: Semaglutide (RYBELSUS) 3 MG TABS  Other depression - with emotional eating  - Plan: escitalopram (LEXAPRO) 10 MG tablet  Class 1 obesity with serious comorbidity and body mass index (BMI) of 31.0 to 31.9 in adult, unspecified obesity type  PLAN:  Pre-Diabetes Yvonne [name] will continue to work on weight loss, exercise, and decreasing simple carbohydrates in her diet to help decrease the risk of diabetes. We dicussed metformin including benefits and risks. She was informed that eating too many simple carbohydrates or too many calories at one sitting increases the likelihood of GI side effects. Yvonne [name] agrees to continue taking Rybelsus 3 mg PO q daily #30 and we will refill for 1 month. Yvonne [name] agrees to follow up with our clinic in 3 weeks as directed to monitor her progress.  Depression with Emotional Eating Behaviors We discussed behavior modification techniques today to help Yvonne [name]  deal with her emotional eating and depression. Yvonne [name] agrees to continue taking Lexapro 10 mg PO q daily #30 and we will refill for 1 month. Yvonne [name] agrees to follow up with our clinic in 3 weeks.  Obesity Yvonne [name] is currently in the action stage of change. As such, her goal is to continue with weight loss efforts She has agreed to follow the Category 3 plan Yvonne [name] has been instructed to work up to a goal of 150 minutes of combined cardio and strengthening  exercise per week for weight loss and overall health benefits. We discussed the following Behavioral Modification Strategies today: no skipping meals   Yvonne [name] has agreed to follow up with our clinic in 3 weeks. She was informed of the importance of frequent follow up visits to maximize her success with intensive lifestyle modifications for her multiple health conditions.  ALLERGIES: No Known Allergies  MEDICATIONS: Current Outpatient Medications on File Prior to Visit  Medication Sig Dispense Refill  . Cholecalciferol (VITAMIN D3) 75 MCG (3000 UT) TABS Take 1 tablet by mouth daily. 30 tablet   . norethindrone-ethinyl estradiol (LOESTRIN FE) 1-20 MG-MCG tablet Take 1 tablet by mouth daily.    . ondansetron (ZOFRAN) 4 MG tablet Take 1 tablet (4 mg total) by mouth every 8 (eight) hours as needed for nausea or vomiting. 20 tablet 0  . polyethylene glycol powder (GLYCOLAX/MIRALAX) 17 GM/SCOOP powder DISSOLVE 8.5 TO 17 GM (SEE FILL LINE.) MIX IN LIQUID AND TAKE BY MOUTH DAILY 3350 g 0   Current Facility-Administered Medications on File Prior to Visit  Medication Dose Route Frequency Provider Last Rate Last Dose  . 0.9 %  sodium chloride infusion  500 mL Intravenous Once Pyrtle, Yvonne Lines, MD        PAST MEDICAL HISTORY: Past Medical History:  Diagnosis Date  . Anemia   . Arthritis   . High blood pressure   . Joint pain   . Kidney stones   . Microscopic hematuria   . Vitamin D deficiency     PAST SURGICAL HISTORY: Past Surgical History:  Procedure Laterality Date  . NO PAST SURGERIES     Denies surgical history    SOCIAL HISTORY: Social History   Tobacco Use  . Smoking status: Never Smoker  . Smokeless tobacco: Never Used  Substance Use Topics  . Alcohol use: No  . Drug use: No    FAMILY HISTORY: Family History  Problem Relation Age of Onset  . Healthy Father   . Congestive Heart Failure Father   . Heart disease Father   . Diabetes Father   . Other Mother         deceased from multiple myelomas  . Cancer Mother   . Colon cancer Neg Hx   . Esophageal cancer Neg Hx   . Liver cancer Neg Hx   . Pancreatic cancer Neg Hx   . Rectal cancer Neg Hx   . Stomach cancer Neg Hx     ROS: Review of Systems  Constitutional: Positive for weight loss.  Gastrointestinal: Negative for nausea.  Endo/Heme/Allergies:       Negative hypoglycemia Positive polyphagia  Psychiatric/Behavioral: Positive for depression. Negative for suicidal ideas.    PHYSICAL EXAM: Pt in no acute distress  RECENT LABS AND TESTS: BMET    Component Value Date/Time   NA 139 07/06/2019 0159   NA 138 12/26/2018 1219   K 3.9 07/06/2019 0159   CL 107 07/06/2019 0159   CO2 23 07/06/2019 0159  GLUCOSE 111 (H) 07/06/2019 0159   BUN 13 07/06/2019 0159   BUN 15 12/26/2018 1219   CREATININE 0.89 07/06/2019 0159   CREATININE 0.85 04/28/2019 1457   CALCIUM 9.0 07/06/2019 0159   GFRNONAA >60 07/06/2019 0159   GFRAA >60 07/06/2019 0159   Lab Results  Component Value Date   HGBA1C 5.9 (H) 12/26/2018   HGBA1C 5.6 02/15/2018   HGBA1C 5.7 (H) 09/13/2017   HGBA1C 6.2 (H) 05/17/2017   HGBA1C 5.8 10/12/2014   Lab Results  Component Value Date   INSULIN 9.3 12/26/2018   INSULIN 28.8 (H) 02/15/2018   INSULIN 35.7 (H) 09/13/2017   INSULIN 27.9 (H) 05/17/2017   CBC    Component Value Date/Time   WBC 11.6 (H) 07/06/2019 0159   RBC 4.34 07/06/2019 0159   HGB 13.6 07/06/2019 0159   HGB 14.3 12/26/2018 1219   HCT 42.1 07/06/2019 0159   HCT 41.7 12/26/2018 1219   PLT 306 07/06/2019 0159   PLT 340 12/26/2018 1219   MCV 97.0 07/06/2019 0159   MCV 91 12/26/2018 1219   MCH 31.3 07/06/2019 0159   MCHC 32.3 07/06/2019 0159   RDW 13.7 07/06/2019 0159   RDW 13.2 12/26/2018 1219   LYMPHSABS 3,754 04/28/2019 1457   LYMPHSABS 2.7 12/26/2018 1219   MONOABS 0.6 03/30/2018 0744   EOSABS 156 04/28/2019 1457   EOSABS 0.1 12/26/2018 1219   BASOSABS 62 04/28/2019 1457   BASOSABS 0.1  12/26/2018 1219   Iron/TIBC/Ferritin/ %Sat No results found for: IRON, TIBC, FERRITIN, IRONPCTSAT Lipid Panel     Component Value Date/Time   CHOL 210 (H) 04/28/2019 1457   CHOL 238 (H) 12/26/2018 1219   TRIG 116 04/28/2019 1457   HDL 61 04/28/2019 1457   HDL 74 12/26/2018 1219   CHOLHDL 3.4 04/28/2019 1457   VLDL 13.2 03/30/2018 0744   LDLCALC 127 (H) 04/28/2019 1457   Hepatic Function Panel     Component Value Date/Time   PROT 8.1 07/06/2019 0159   PROT 6.7 04/28/2019 1457   ALBUMIN 4.3 07/06/2019 0159   ALBUMIN 4.7 12/26/2018 1219   AST 19 07/06/2019 0159   ALT 18 07/06/2019 0159   ALKPHOS 79 07/06/2019 0159   BILITOT 0.9 07/06/2019 0159   BILITOT 0.5 12/26/2018 1219   BILIDIR 0.1 04/28/2019 1457   IBILI 0.3 04/28/2019 1457      Component Value Date/Time   TSH 0.77 04/28/2019 1457   TSH 1.070 12/26/2018 1219   TSH 1.32 03/30/2018 0744      I, Trixie Dredge, am acting as transcriptionist for Dennard Nip, MD I have reviewed the above documentation for accuracy and completeness, and I agree with the above. -Dennard Nip, MD

## 2019-09-30 MED FILL — ESCITALOPRAM 10 MG TABLET: 10 | 30 days supply | Qty: 30 | Fill #0

## 2019-10-11 ENCOUNTER — Other Ambulatory Visit: Payer: Self-pay

## 2019-10-11 ENCOUNTER — Telehealth (INDEPENDENT_AMBULATORY_CARE_PROVIDER_SITE_OTHER): Payer: 59 | Admitting: Family Medicine

## 2019-10-11 ENCOUNTER — Encounter (INDEPENDENT_AMBULATORY_CARE_PROVIDER_SITE_OTHER): Payer: Self-pay | Admitting: Family Medicine

## 2019-10-11 DIAGNOSIS — F3289 Other specified depressive episodes: Secondary | ICD-10-CM | POA: Diagnosis not present

## 2019-10-11 DIAGNOSIS — E669 Obesity, unspecified: Secondary | ICD-10-CM | POA: Diagnosis not present

## 2019-10-11 DIAGNOSIS — Z6831 Body mass index (BMI) 31.0-31.9, adult: Secondary | ICD-10-CM | POA: Diagnosis not present

## 2019-10-11 DIAGNOSIS — R7303 Prediabetes: Secondary | ICD-10-CM

## 2019-10-11 MED ORDER — ESCITALOPRAM OXALATE 20 MG PO TABS: 20.0000 mg | ORAL_TABLET | Freq: Every day | ORAL | 0 refills | Status: DC

## 2019-10-11 MED ORDER — RYBELSUS 7 MG PO TABS: 1.0000 | ORAL_TABLET | Freq: Every day | ORAL | 0 refills | Status: DC

## 2019-10-11 MED FILL — ESCITALOPRAM 20 MG TABLET: 20 | 30 days supply | Qty: 30 | Fill #0

## 2019-10-11 MED FILL — RYBELSUS 7 MG TABS: 7 | 30 days supply | Qty: 30 | Fill #0

## 2019-10-11 NOTE — Progress Notes (Signed)
Office: [phone]  /  Fax: [phone] TeleHealth Visit:  Yvonne [name] has verbally consented to this TeleHealth visit today. The patient is located at home, the provider is located at the [employer] [facility] office. The participants in this visit include the listed provider and patient. The visit was conducted today via face time.  HPI:   Chief Complaint: OBESITY Yvonne [name] is here to discuss her progress with her obesity treatment plan. She is on the Category 3 plan and is following her eating plan approximately 80 % of the time. She states she is walking for 30 minutes 5 times per week. [name] feels like she has a lot of stress and has been feeling overwhelmed and somewhat deprived. She has lost another 3 lbs since our last visit but is frustrated the it wasn't more.  We were unable to weigh the patient today for this TeleHealth visit. She feels as if she has lost 3 lbs since her last visit. She has lost 15-18 lbs since starting treatment with [place].  Pre-Diabetes Yvonne [name] has a diagnosis of pre-diabetes based on her elevated Hgb A1c and was informed this puts her at greater risk of developing diabetes. She is tolerating Rybelsus well, but still notes polyphagia. She denies GI upset. She asks if we could increase her dose. She continues to work on diet and exercise to decrease risk of diabetes. She denies nausea or hypoglycemia.  Depression with Emotional Eating Behaviors Yvonne [name]'s mood has decreased with increased work stress and frustration. She has been more tempted to increase emotional eating recently. Yvonne [name] with emotional eating and using food for comfort to the extent that it is negatively impacting her health. She often snacks when she is not hungry. Yvonne [name] sometimes feels she is out of control and then feels guilty that she made poor food choices. She has been working on behavior modification techniques to help reduce her emotional eating and has been somewhat successful. She  shows no sign of suicidal or homicidal ideations.  Depression screen [facility] 2/9 06/06/2018 05/17/2017 04/02/2017  Decreased Interest 0 3 0  Down, Depressed, Hopeless 0 0 0  PHQ - 2 Score 0 3 0  Altered sleeping 1 3 0  Tired, decreased energy 1 3 0  Change in appetite 0 0 1  Feeling bad or failure about yourself  0 0 0  Trouble concentrating 3 0 0  Moving slowly or fidgety/restless 0 0 0  Suicidal thoughts 0 0 0  PHQ-9 Score 5 9 1     ASSESSMENT AND PLAN:  Prediabetes - Plan: Semaglutide (RYBELSUS) 7 MG TABS  Other depression - with emotional eating  - Plan: escitalopram (LEXAPRO) 20 MG tablet  Class 1 obesity with serious comorbidity and body mass index (BMI) of 31.0 to 31.9 in adult, unspecified obesity type  PLAN:  Pre-Diabetes Yvonne [name] will continue to work on weight loss, exercise, and decreasing simple carbohydrates in her diet to help decrease the risk of diabetes. We dicussed metformin including benefits and risks. She was informed that eating too many simple carbohydrates or too many calories at one sitting increases the likelihood of GI side effects. Yvonne [name] agrees to increase Rybelsus to 7 mg q daily #30 with no refills. Yvonne [name] agrees to follow up with our clinic in 2 weeks as directed to monitor her progress.  Depression with Emotional Eating Behaviors We discussed behavior modification techniques today to help [facility] [name] deal with her emotional eating and depression. [name] agrees to increase to Lexapro to 20 mg q  daily #30 with no refills. Stress reduction methods were discussed. Yvonne [name] agrees to follow up with our clinic in 2 weeks.  Obesity Yvonne [name] is currently in the action stage of change. As such, her goal is to continue with weight loss efforts She has agreed to follow the Category 3 plan [name] has been instructed to work up to a goal of 150 minutes of combined cardio and strengthening exercise per week for weight loss and overall health benefits. We discussed the following  Behavioral Modification Strategies today: emotional eating strategies   Yvonne [name] has agreed to follow up with our clinic in 2 weeks with Dr. Leafy Ro. She was informed of the importance of frequent follow up visits to maximize her success with intensive lifestyle modifications for her multiple health conditions.  ALLERGIES: No Known Allergies  MEDICATIONS: Current Outpatient Medications on File Prior to Visit  Medication Sig Dispense Refill  . Cholecalciferol (VITAMIN D3) 75 MCG (3000 UT) TABS Take 1 tablet by mouth daily. 30 tablet   . norethindrone-ethinyl estradiol (LOESTRIN FE) 1-20 MG-MCG tablet Take 1 tablet by mouth daily.    . ondansetron (ZOFRAN) 4 MG tablet Take 1 tablet (4 mg total) by mouth every 8 (eight) hours as needed for nausea or vomiting. 20 tablet 0  . polyethylene glycol powder (GLYCOLAX/MIRALAX) 17 GM/SCOOP powder DISSOLVE 8.5 TO 17 GM (SEE FILL LINE.) MIX IN LIQUID AND TAKE BY MOUTH DAILY 3350 g 0   Current Facility-Administered Medications on File Prior to Visit  Medication Dose Route Frequency Provider Last Rate Last Dose  . 0.9 %  sodium chloride infusion  500 mL Intravenous Once Pyrtle, Lajuan Lines, MD        PAST MEDICAL HISTORY: Past Medical History:  Diagnosis Date  . Anemia   . Arthritis   . High blood pressure   . Joint pain   . Kidney stones   . Microscopic hematuria   . Vitamin D deficiency     PAST SURGICAL HISTORY: Past Surgical History:  Procedure Laterality Date  . NO PAST SURGERIES     Denies surgical history    SOCIAL HISTORY: Social History   Tobacco Use  . Smoking status: Never Smoker  . Smokeless tobacco: Never Used  Substance Use Topics  . Alcohol use: No  . Drug use: No    FAMILY HISTORY: Family History  Problem Relation Age of Onset  . Healthy Father   . Congestive Heart Failure Father   . Heart disease Father   . Diabetes Father   . Other Mother        deceased from multiple myelomas  . Cancer Mother   . Colon cancer  Neg Hx   . Esophageal cancer Neg Hx   . Liver cancer Neg Hx   . Pancreatic cancer Neg Hx   . Rectal cancer Neg Hx   . Stomach cancer Neg Hx     ROS: Review of Systems  Constitutional: Positive for weight loss.  Gastrointestinal: Negative for nausea.  Endo/Heme/Allergies:       Positive polyphagia Negative hypoglycemia  Psychiatric/Behavioral: Positive for depression. Negative for suicidal ideas.    PHYSICAL EXAM: Pt in no acute distress  RECENT LABS AND TESTS: BMET    Component Value Date/Time   NA 139 07/06/2019 0159   NA 138 12/26/2018 1219   K 3.9 07/06/2019 0159   CL 107 07/06/2019 0159   CO2 23 07/06/2019 0159   GLUCOSE 111 (H) 07/06/2019 0159   BUN 13 07/06/2019 0159  BUN 15 12/26/2018 1219   CREATININE 0.89 07/06/2019 0159   CREATININE 0.85 04/28/2019 1457   CALCIUM 9.0 07/06/2019 0159   GFRNONAA >60 07/06/2019 0159   GFRAA >60 07/06/2019 0159   Lab Results  Component Value Date   HGBA1C 5.9 (H) 12/26/2018   HGBA1C 5.6 02/15/2018   HGBA1C 5.7 (H) 09/13/2017   HGBA1C 6.2 (H) 05/17/2017   HGBA1C 5.8 10/12/2014   Lab Results  Component Value Date   INSULIN 9.3 12/26/2018   INSULIN 28.8 (H) 02/15/2018   INSULIN 35.7 (H) 09/13/2017   INSULIN 27.9 (H) 05/17/2017   CBC    Component Value Date/Time   WBC 11.6 (H) 07/06/2019 0159   RBC 4.34 07/06/2019 0159   HGB 13.6 07/06/2019 0159   HGB 14.3 12/26/2018 1219   HCT 42.1 07/06/2019 0159   HCT 41.7 12/26/2018 1219   PLT 306 07/06/2019 0159   PLT 340 12/26/2018 1219   MCV 97.0 07/06/2019 0159   MCV 91 12/26/2018 1219   MCH 31.3 07/06/2019 0159   MCHC 32.3 07/06/2019 0159   RDW 13.7 07/06/2019 0159   RDW 13.2 12/26/2018 1219   LYMPHSABS 3,754 04/28/2019 1457   LYMPHSABS 2.7 12/26/2018 1219   MONOABS 0.6 03/30/2018 0744   EOSABS 156 04/28/2019 1457   EOSABS 0.1 12/26/2018 1219   BASOSABS 62 04/28/2019 1457   BASOSABS 0.1 12/26/2018 1219   Iron/TIBC/Ferritin/ %Sat No results found for: IRON,  TIBC, FERRITIN, IRONPCTSAT Lipid Panel     Component Value Date/Time   CHOL 210 (H) 04/28/2019 1457   CHOL 238 (H) 12/26/2018 1219   TRIG 116 04/28/2019 1457   HDL 61 04/28/2019 1457   HDL 74 12/26/2018 1219   CHOLHDL 3.4 04/28/2019 1457   VLDL 13.2 03/30/2018 0744   LDLCALC 127 (H) 04/28/2019 1457   Hepatic Function Panel     Component Value Date/Time   PROT 8.1 07/06/2019 0159   PROT 6.7 04/28/2019 1457   ALBUMIN 4.3 07/06/2019 0159   ALBUMIN 4.7 12/26/2018 1219   AST 19 07/06/2019 0159   ALT 18 07/06/2019 0159   ALKPHOS 79 07/06/2019 0159   BILITOT 0.9 07/06/2019 0159   BILITOT 0.5 12/26/2018 1219   BILIDIR 0.1 04/28/2019 1457   IBILI 0.3 04/28/2019 1457      Component Value Date/Time   TSH 0.77 04/28/2019 1457   TSH 1.070 12/26/2018 1219   TSH 1.32 03/30/2018 0744      I, Trixie Dredge, am acting as transcriptionist for Dennard Nip, MD I have reviewed the above documentation for accuracy and completeness, and I agree with the above. -Dennard Nip, MD

## 2019-10-25 ENCOUNTER — Telehealth (INDEPENDENT_AMBULATORY_CARE_PROVIDER_SITE_OTHER): Payer: 59 | Admitting: Family Medicine

## 2019-10-25 ENCOUNTER — Encounter (INDEPENDENT_AMBULATORY_CARE_PROVIDER_SITE_OTHER): Payer: Self-pay | Admitting: Family Medicine

## 2019-10-25 ENCOUNTER — Other Ambulatory Visit: Payer: Self-pay

## 2019-10-25 DIAGNOSIS — E8881 Metabolic syndrome: Secondary | ICD-10-CM

## 2019-10-25 DIAGNOSIS — Z6831 Body mass index (BMI) 31.0-31.9, adult: Secondary | ICD-10-CM | POA: Diagnosis not present

## 2019-10-25 DIAGNOSIS — F418 Other specified anxiety disorders: Secondary | ICD-10-CM

## 2019-10-25 DIAGNOSIS — E669 Obesity, unspecified: Secondary | ICD-10-CM

## 2019-10-30 NOTE — Progress Notes (Signed)
Office: [phone]  /  Fax: [phone] TeleHealth Visit:  Yvonne [name] has verbally consented to this TeleHealth visit today. The patient is located at home, the provider is located at the [facility] office. The participants in this visit include the listed provider and patient. Yvonne [name] was unable to use realtime audiovisual technology today and the telehealth visit was conducted via telephone.   HPI:   Chief Complaint: OBESITY Yvonne [name] is here to discuss her progress with her obesity treatment plan. She is on the Category 3 plan and is following her eating plan approximately 70 % of the time. She states she is walking for 30 minutes 5 times per week. Yvonne [name] has done well maintaining her weight. She has done better with decreasing emotional eating and is working on meal planning. 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  We were unable to weigh the patient today for this TeleHealth visit. She feels as if she has maintained her weight since her last visit. She has lost 15-18 lbs since starting treatment with [place].  Insulin Resistance Yvonne [name] has a diagnosis of insulin resistance based on her elevated fasting insulin level >5. Although Yvonne [name]'s blood glucose readings are still under good control, insulin resistance puts her at greater risk of metabolic syndrome and diabetes. She notes decreased polyphagia on Rybelsus. She feels she gets full faster and stays full longer. She continues to work on diet and exercise to decrease risk of diabetes.  Depression with Anxiety Yvonne [name] notes depression with anxiety. She increased her does of Lexapro and feels her mood has improved significantly. She is not stressing about things she cannot control at work, and she is sleeping better. She denies suicidal ideas or homicidal ideas.  ASSESSMENT AND PLAN:  Insulin resistance  Depression with anxiety  Class 1 obesity with serious comorbidity and body mass index (BMI)  of 31.0 to 31.9 in adult, unspecified obesity type  PLAN:  Insulin Resistance Yvonne [name] will continue to work on weight loss, diet, exercise, and decreasing simple carbohydrates in her diet to help decrease the risk of diabetes. We dicussed metformin including benefits and risks. She was informed that eating too many simple carbohydrates or too many calories at one sitting increases the likelihood of GI side effects. Yvonne [name] agrees to continue taking Rybelsus, and we will get labs at her next visit in 3 weeks. Yvonne [name] agrees to follow up with [place] as directed to monitor her progress.  Depression with Anxiety We discussed behavior modification techniques today to help Yvonne [name] deal with her depression and anxiety. Yvonne [name] agrees to continue taking Lexapro at 20 mg, and she agrees to follow up with our clinic in 3 weeks.  I spent > than 50% of the 25 minute visit on counseling as documented in the note.  Obesity Yvonne [name] is currently in the action stage of change. As such, her goal is to continue with weight loss efforts She has agreed to follow the Category 3 plan Yvonne [name] has been instructed to work up to a goal of 150 minutes of combined cardio and strengthening exercise per week for weight loss and overall health benefits. We discussed the following Behavioral Modification Strategies today: work on meal planning and easy cooking plans and holiday eating strategies    Yvonne [name] has agreed to follow up with our clinic in 3 weeks. She was informed of the importance of frequent follow up visits to maximize her success with intensive lifestyle modifications for her multiple health conditions.  ALLERGIES:  No Known Allergies  MEDICATIONS: Current Outpatient Medications on File Prior to Visit  Medication Sig Dispense Refill  . Cholecalciferol (VITAMIN D3) 75 MCG (3000 UT) TABS Take 1 tablet by mouth daily. 30 tablet   . escitalopram (LEXAPRO) 20 MG tablet Take 1 tablet (20 mg total) by mouth daily. 30 tablet 0   . norethindrone-ethinyl estradiol (LOESTRIN FE) 1-20 MG-MCG tablet Take 1 tablet by mouth daily.    . ondansetron (ZOFRAN) 4 MG tablet Take 1 tablet (4 mg total) by mouth every 8 (eight) hours as needed for nausea or vomiting. 20 tablet 0  . polyethylene glycol powder (GLYCOLAX/MIRALAX) 17 GM/SCOOP powder DISSOLVE 8.5 TO 17 GM (SEE FILL LINE.) MIX IN LIQUID AND TAKE BY MOUTH DAILY 3350 g 0  . Semaglutide (RYBELSUS) 7 MG TABS Take 1 tablet by mouth daily. 30 tablet 0   Current Facility-Administered Medications on File Prior to Visit  Medication Dose Route Frequency Provider Last Rate Last Dose  . 0.9 %  sodium chloride infusion  500 mL Intravenous Once Pyrtle, Lajuan Lines, MD        PAST MEDICAL HISTORY: Past Medical History:  Diagnosis Date  . Anemia   . Arthritis   . High blood pressure   . Joint pain   . Kidney stones   . Microscopic hematuria   . Vitamin D deficiency     PAST SURGICAL HISTORY: Past Surgical History:  Procedure Laterality Date  . NO PAST SURGERIES     Denies surgical history    SOCIAL HISTORY: Social History   Tobacco Use  . Smoking status: Never Smoker  . Smokeless tobacco: Never Used  Substance Use Topics  . Alcohol use: No  . Drug use: No    FAMILY HISTORY: Family History  Problem Relation Age of Onset  . Healthy Father   . Congestive Heart Failure Father   . Heart disease Father   . Diabetes Father   . Other Mother        deceased from multiple myelomas  . Cancer Mother   . Colon cancer Neg Hx   . Esophageal cancer Neg Hx   . Liver cancer Neg Hx   . Pancreatic cancer Neg Hx   . Rectal cancer Neg Hx   . Stomach cancer Neg Hx     ROS: Review of Systems  Constitutional: Negative for weight loss.  Endo/Heme/Allergies:       Positive polyphagia  Psychiatric/Behavioral: Positive for depression. Negative for suicidal ideas.       + Anxiety    PHYSICAL EXAM: Pt in no acute distress  RECENT LABS AND TESTS: BMET    Component Value  Date/Time   NA 139 07/06/2019 0159   NA 138 12/26/2018 1219   K 3.9 07/06/2019 0159   CL 107 07/06/2019 0159   CO2 23 07/06/2019 0159   GLUCOSE 111 (H) 07/06/2019 0159   BUN 13 07/06/2019 0159   BUN 15 12/26/2018 1219   CREATININE 0.89 07/06/2019 0159   CREATININE 0.85 04/28/2019 1457   CALCIUM 9.0 07/06/2019 0159   GFRNONAA >60 07/06/2019 0159   GFRAA >60 07/06/2019 0159   Lab Results  Component Value Date   HGBA1C 5.9 (H) 12/26/2018   HGBA1C 5.6 02/15/2018   HGBA1C 5.7 (H) 09/13/2017   HGBA1C 6.2 (H) 05/17/2017   HGBA1C 5.8 10/12/2014   Lab Results  Component Value Date   INSULIN 9.3 12/26/2018   INSULIN 28.8 (H) 02/15/2018   INSULIN 35.7 (H) 09/13/2017   INSULIN  27.9 (H) 05/17/2017   CBC    Component Value Date/Time   WBC 11.6 (H) 07/06/2019 0159   RBC 4.34 07/06/2019 0159   HGB 13.6 07/06/2019 0159   HGB 14.3 12/26/2018 1219   HCT 42.1 07/06/2019 0159   HCT 41.7 12/26/2018 1219   PLT 306 07/06/2019 0159   PLT 340 12/26/2018 1219   MCV 97.0 07/06/2019 0159   MCV 91 12/26/2018 1219   MCH 31.3 07/06/2019 0159   MCHC 32.3 07/06/2019 0159   RDW 13.7 07/06/2019 0159   RDW 13.2 12/26/2018 1219   LYMPHSABS 3,754 04/28/2019 1457   LYMPHSABS 2.7 12/26/2018 1219   MONOABS 0.6 03/30/2018 0744   EOSABS 156 04/28/2019 1457   EOSABS 0.1 12/26/2018 1219   BASOSABS 62 04/28/2019 1457   BASOSABS 0.1 12/26/2018 1219   Iron/TIBC/Ferritin/ %Sat No results found for: IRON, TIBC, FERRITIN, IRONPCTSAT Lipid Panel     Component Value Date/Time   CHOL 210 (H) 04/28/2019 1457   CHOL 238 (H) 12/26/2018 1219   TRIG 116 04/28/2019 1457   HDL 61 04/28/2019 1457   HDL 74 12/26/2018 1219   CHOLHDL 3.4 04/28/2019 1457   VLDL 13.2 03/30/2018 0744   LDLCALC 127 (H) 04/28/2019 1457   Hepatic Function Panel     Component Value Date/Time   PROT 8.1 07/06/2019 0159   PROT 6.7 04/28/2019 1457   ALBUMIN 4.3 07/06/2019 0159   ALBUMIN 4.7 12/26/2018 1219   AST 19 07/06/2019 0159    ALT 18 07/06/2019 0159   ALKPHOS 79 07/06/2019 0159   BILITOT 0.9 07/06/2019 0159   BILITOT 0.5 12/26/2018 1219   BILIDIR 0.1 04/28/2019 1457   IBILI 0.3 04/28/2019 1457      Component Value Date/Time   TSH 0.77 04/28/2019 1457   TSH 1.070 12/26/2018 1219   TSH 1.32 03/30/2018 0744      I, Trixie Dredge, am acting as transcriptionist for Dennard Nip, MD I have reviewed the above documentation for accuracy and completeness, and I agree with the above. -Dennard Nip, MD

## 2019-11-09 ENCOUNTER — Other Ambulatory Visit: Payer: Self-pay

## 2019-11-09 ENCOUNTER — Ambulatory Visit (INDEPENDENT_AMBULATORY_CARE_PROVIDER_SITE_OTHER): Payer: 59 | Admitting: Family Medicine

## 2019-11-09 ENCOUNTER — Encounter (INDEPENDENT_AMBULATORY_CARE_PROVIDER_SITE_OTHER): Payer: Self-pay | Admitting: Family Medicine

## 2019-11-09 VITALS — BP 128/88 | HR 103 | Temp 98.7°F | Ht 65.0 in | Wt 193.0 lb

## 2019-11-09 DIAGNOSIS — Z9189 Other specified personal risk factors, not elsewhere classified: Secondary | ICD-10-CM | POA: Diagnosis not present

## 2019-11-09 DIAGNOSIS — E669 Obesity, unspecified: Secondary | ICD-10-CM | POA: Diagnosis not present

## 2019-11-09 DIAGNOSIS — Z6832 Body mass index (BMI) 32.0-32.9, adult: Secondary | ICD-10-CM

## 2019-11-09 DIAGNOSIS — F418 Other specified anxiety disorders: Secondary | ICD-10-CM

## 2019-11-09 DIAGNOSIS — R7303 Prediabetes: Secondary | ICD-10-CM | POA: Diagnosis not present

## 2019-11-09 MED ORDER — RYBELSUS 7 MG PO TABS: 1.0000 | ORAL_TABLET | Freq: Every day | ORAL | 0 refills | Status: DC

## 2019-11-09 MED ORDER — ESCITALOPRAM OXALATE 20 MG PO TABS: 20.0000 mg | ORAL_TABLET | Freq: Every day | ORAL | 0 refills | Status: DC

## 2019-11-09 MED FILL — JUNEL 1-20 TABLET: 1-20 | 84 days supply | Qty: 84 | Fill #1

## 2019-11-09 NOTE — Progress Notes (Signed)
Office: [phone]  /  Fax: [phone]   HPI:   Chief Complaint: OBESITY Yvonne [name] is here to discuss her progress with her obesity treatment plan. She is on the Category 3 plan and is following her eating plan approximately 40-50 % of the time. She states she is exercising 0 minutes 0 times per week. Yvonne [name] has not been concentrating on weight loss.  Her weight is 193 lb (87.5 kg) today and has gained 4 lbs since her last visit. She has lost 11 lbs since starting treatment with [place].  Pre-Diabetes Yvonne [name] has a diagnosis of pre-diabetes based on her elevated Hgb A1c and was informed this puts her at greater risk of developing diabetes. She is stable on Rybelsus, misses doses at times due to not planning breakfast. She denies nausea or vomiting. continues to work on diet and exercise to decrease risk of diabetes. She denies hypoglycemia.  Depression with Anxiety Yamilette states her anxiety has worsened due to worsening COVID19, and increased stress. She feels the Lexapro has helped but feels overwhelmed and unable to deal with work at times. She notes increased emotional eating and using food for comfort to the extent that it is negatively impacting her health. She often snacks when she is not hungry. Carrina sometimes feels she is out of control and then feels guilty that she made poor food choices. She has been working on behavior modification techniques to help reduce her emotional eating and has been somewhat successful. She shows no sign of suicidal or homicidal ideations.  At risk for cardiovascular disease Shekema is at a higher than average risk for cardiovascular disease due to obesity. She currently denies any chest pain.  ASSESSMENT AND PLAN:  Prediabetes - Plan: Comprehensive Metabolic Panel (CMET), HgB A1c, Insulin, random, Lipid Panel With LDL/HDL Ratio, Vitamin D (25 hydroxy), Semaglutide (RYBELSUS) 7 MG TABS  Depression with anxiety - Plan: escitalopram (LEXAPRO) 20 MG tablet   At risk for heart disease  Class 1 obesity with serious comorbidity and body mass index (BMI) of 32.0 to 32.9 in adult, unspecified obesity type  PLAN:  Pre-Diabetes [name] will continue to work on weight loss, exercise, and decreasing simple carbohydrates in her diet to help decrease the risk of diabetes. We dicussed metformin including benefits and risks. She was informed that eating too many simple carbohydrates or too many calories at one sitting increases the likelihood of GI side effects. Yvonne [name] agrees to continue taking Rybelsus 7 mg PO daily #30 and we will refill for 1 month. We will check labs today. Yvonne [name] agrees to follow up with our clinic in 4 weeks as directed to monitor her progress.  Depression with Anxiety We discussed behavior modification techniques today to help Yvonne [name] deal with her emotional eating behaviors. [name] agrees to continue taking Lexapro 20 mg PO daily #30 and we will refill for 1 month. We will refer to Psychiatry for further evaluation and treatment. [name] agrees to follow up with our clinic in 4 weeks.   Cardiovascular risk counseling [name] was given (~30 minutes) coronary artery disease prevention counseling today. She is 48 y.o. female and has risk factors for heart disease including obesity. We discussed intensive lifestyle modifications today with an emphasis on specific weight loss instructions and strategies.   Obesity Yvonne [name] is currently in the action stage of change. As such, her goal is to continue with weight loss efforts She has agreed to follow the Category 3 plan Yvonne [name] has been instructed to work up to a goal  of 150 minutes of combined cardio and strengthening exercise per week for weight loss and overall health benefits. We discussed the following Behavioral Modification Strategies today: increasing lean protein intake, work on meal planning and easy cooking plans and emotional eating strategies   Yvonne [name] has agreed to follow up with our  clinic in 4 weeks. She was informed of the importance of frequent follow up visits to maximize her success with intensive lifestyle modifications for her multiple health conditions.  ALLERGIES: No Known Allergies  MEDICATIONS: Current Outpatient Medications on File Prior to Visit  Medication Sig Dispense Refill  . Cholecalciferol (VITAMIN D3) 75 MCG (3000 UT) TABS Take 1 tablet by mouth daily. 30 tablet   . norethindrone-ethinyl estradiol (LOESTRIN FE) 1-20 MG-MCG tablet Take 1 tablet by mouth daily.    . ondansetron (ZOFRAN) 4 MG tablet Take 1 tablet (4 mg total) by mouth every 8 (eight) hours as needed for nausea or vomiting. 20 tablet 0  . polyethylene glycol powder (GLYCOLAX/MIRALAX) 17 GM/SCOOP powder DISSOLVE 8.5 TO 17 GM (SEE FILL LINE.) MIX IN LIQUID AND TAKE BY MOUTH DAILY 3350 g 0   Current Facility-Administered Medications on File Prior to Visit  Medication Dose Route Frequency Provider Last Rate Last Dose  . 0.9 %  sodium chloride infusion  500 mL Intravenous Once Yvonne [name] Lines, MD        PAST MEDICAL HISTORY: Past Medical History:  Diagnosis Date  . Anemia   . Arthritis   . High blood pressure   . Joint pain   . Kidney stones   . Microscopic hematuria   . Vitamin D deficiency     PAST SURGICAL HISTORY: Past Surgical History:  Procedure Laterality Date  . NO PAST SURGERIES     Denies surgical history    SOCIAL HISTORY: Social History   Tobacco Use  . Smoking status: Never Smoker  . Smokeless tobacco: Never Used  Substance Use Topics  . Alcohol use: No  . Drug use: No    FAMILY HISTORY: Family History  Problem Relation Age of Onset  . Healthy Father   . Congestive Heart Failure Father   . Heart disease Father   . Diabetes Father   . Other Mother        deceased from multiple myelomas  . Cancer Mother   . Colon cancer Neg Hx   . Esophageal cancer Neg Hx   . Liver cancer Neg Hx   . Pancreatic cancer Neg Hx   . Rectal cancer Neg Hx   . Stomach  cancer Neg Hx     ROS: Review of Systems  Constitutional: Negative for weight loss.  Cardiovascular: Negative for chest pain.  Gastrointestinal: Negative for nausea and vomiting.  Endo/Heme/Allergies:       Negative hypoglycemia  Psychiatric/Behavioral: Positive for depression. Negative for suicidal ideas.       + Anxiety    PHYSICAL EXAM: Blood pressure 128/88, pulse (!) 103, temperature 98.7 F (37.1 C), temperature source Oral, height 5\' 5"  (1.651 m), weight 193 lb (87.5 kg), SpO2 98 %. Body mass index is 32.12 kg/m. Physical Exam Vitals signs reviewed.  Constitutional:      Appearance: Normal appearance. She is obese.  Cardiovascular:     Rate and Rhythm: Normal rate.     Pulses: Normal pulses.  Pulmonary:     Effort: Pulmonary effort is normal.     Breath sounds: Normal breath sounds.  Musculoskeletal: Normal range of motion.  Skin:  General: Skin is warm and dry.  Neurological:     Mental Status: She is alert and oriented to person, place, and time.  Psychiatric:        Mood and Affect: Mood normal.        Behavior: Behavior normal.     RECENT LABS AND TESTS: BMET    Component Value Date/Time   NA 139 07/06/2019 0159   NA 138 12/26/2018 1219   K 3.9 07/06/2019 0159   CL 107 07/06/2019 0159   CO2 23 07/06/2019 0159   GLUCOSE 111 (H) 07/06/2019 0159   BUN 13 07/06/2019 0159   BUN 15 12/26/2018 1219   CREATININE 0.89 07/06/2019 0159   CREATININE 0.85 04/28/2019 1457   CALCIUM 9.0 07/06/2019 0159   GFRNONAA >60 07/06/2019 0159   GFRAA >60 07/06/2019 0159   Lab Results  Component Value Date   HGBA1C 5.9 (H) 12/26/2018   HGBA1C 5.6 02/15/2018   HGBA1C 5.7 (H) 09/13/2017   HGBA1C 6.2 (H) 05/17/2017   HGBA1C 5.8 10/12/2014   Lab Results  Component Value Date   INSULIN 9.3 12/26/2018   INSULIN 28.8 (H) 02/15/2018   INSULIN 35.7 (H) 09/13/2017   INSULIN 27.9 (H) 05/17/2017   CBC    Component Value Date/Time   WBC 11.6 (H) 07/06/2019 0159    RBC 4.34 07/06/2019 0159   HGB 13.6 07/06/2019 0159   HGB 14.3 12/26/2018 1219   HCT 42.1 07/06/2019 0159   HCT 41.7 12/26/2018 1219   PLT 306 07/06/2019 0159   PLT 340 12/26/2018 1219   MCV 97.0 07/06/2019 0159   MCV 91 12/26/2018 1219   MCH 31.3 07/06/2019 0159   MCHC 32.3 07/06/2019 0159   RDW 13.7 07/06/2019 0159   RDW 13.2 12/26/2018 1219   LYMPHSABS 3,754 04/28/2019 1457   LYMPHSABS 2.7 12/26/2018 1219   MONOABS 0.6 03/30/2018 0744   EOSABS 156 04/28/2019 1457   EOSABS 0.1 12/26/2018 1219   BASOSABS 62 04/28/2019 1457   BASOSABS 0.1 12/26/2018 1219   Iron/TIBC/Ferritin/ %Sat No results found for: IRON, TIBC, FERRITIN, IRONPCTSAT Lipid Panel     Component Value Date/Time   CHOL 210 (H) 04/28/2019 1457   CHOL 238 (H) 12/26/2018 1219   TRIG 116 04/28/2019 1457   HDL 61 04/28/2019 1457   HDL 74 12/26/2018 1219   CHOLHDL 3.4 04/28/2019 1457   VLDL 13.2 03/30/2018 0744   LDLCALC 127 (H) 04/28/2019 1457   Hepatic Function Panel     Component Value Date/Time   PROT 8.1 07/06/2019 0159   PROT 6.7 04/28/2019 1457   ALBUMIN 4.3 07/06/2019 0159   ALBUMIN 4.7 12/26/2018 1219   AST 19 07/06/2019 0159   ALT 18 07/06/2019 0159   ALKPHOS 79 07/06/2019 0159   BILITOT 0.9 07/06/2019 0159   BILITOT 0.5 12/26/2018 1219   BILIDIR 0.1 04/28/2019 1457   IBILI 0.3 04/28/2019 1457      Component Value Date/Time   TSH 0.77 04/28/2019 1457   TSH 1.070 12/26/2018 1219   TSH 1.32 03/30/2018 0744      OBESITY BEHAVIORAL INTERVENTION VISIT  Today's visit was # 32   Starting weight: 204 lbs Starting date: 05/17/17 Today's weight : 193 lbs Today's date: 11/09/2019 Total lbs lost to date: 11    ASK: We discussed the diagnosis of obesity with Aldona Lento today and Brayton Layman agreed to give [place] permission to discuss obesity behavioral modification therapy today.  ASSESS: Daureen has the diagnosis of obesity and her BMI  today is 32.12 Shaquisha is in the action stage of change    ADVISE: Saroya was educated on the multiple health risks of obesity as well as the benefit of weight loss to improve her health. She was advised of the need for long term treatment and the importance of lifestyle modifications to improve her current health and to decrease her risk of future health problems.  AGREE: Multiple dietary modification options and treatment options were discussed and  Sherrie agreed to follow the recommendations documented in the above note.  ARRANGE: Zipporah was educated on the importance of frequent visits to treat obesity as outlined per CMS and USPSTF guidelines and agreed to schedule her next follow up appointment today.  I, Trixie Dredge, am acting as transcriptionist for Dennard Nip, MD  I have reviewed the above documentation for accuracy and completeness, and I agree with the above. -Dennard Nip, MD

## 2019-11-10 LAB — COMPREHENSIVE METABOLIC PANEL
ALT: 15 IU/L (ref 0–32)
AST: 15 IU/L (ref 0–40)
Albumin/Globulin Ratio: 1.5 (ref 1.2–2.2)
Albumin: 4.3 g/dL (ref 3.8–4.8)
Alkaline Phosphatase: 87 IU/L (ref 39–117)
BUN/Creatinine Ratio: 12 (ref 9–23)
BUN: 10 mg/dL (ref 6–24)
Bilirubin Total: 0.3 mg/dL (ref 0.0–1.2)
CO2: 19 mmol/L — ABNORMAL LOW (ref 20–29)
Calcium: 9.6 mg/dL (ref 8.7–10.2)
Chloride: 105 mmol/L (ref 96–106)
Creatinine, Ser: 0.84 mg/dL (ref 0.57–1.00)
GFR calc Af Amer: 96 mL/min/{1.73_m2} (ref >59)
GFR calc non Af Amer: 83 mL/min/{1.73_m2} (ref >59)
Globulin, Total: 2.8 g/dL (ref 1.5–4.5)
Glucose: 91 mg/dL (ref 65–99)
Potassium: 4.6 mmol/L (ref 3.5–5.2)
Sodium: 140 mmol/L (ref 134–144)
Total Protein: 7.1 g/dL (ref 6.0–8.5)

## 2019-11-10 LAB — LIPID PANEL WITH LDL/HDL RATIO
Cholesterol, Total: 209 mg/dL — ABNORMAL HIGH (ref 100–199)
HDL: 62 mg/dL (ref >39)
LDL Chol Calc (NIH): 130 mg/dL — ABNORMAL HIGH (ref 0–99)
LDL/HDL Ratio: 2.1 ratio (ref 0.0–3.2)
Triglycerides: 93 mg/dL (ref 0–149)
VLDL Cholesterol Cal: 17 mg/dL (ref 5–40)

## 2019-11-10 LAB — INSULIN, RANDOM: INSULIN: 33.2 u[IU]/mL — ABNORMAL HIGH (ref 2.6–24.9)

## 2019-11-10 LAB — HEMOGLOBIN A1C
Est. average glucose Bld gHb Est-mCnc: 123 mg/dL
Hgb A1c MFr Bld: 5.9 % — ABNORMAL HIGH (ref 4.8–5.6)

## 2019-11-10 LAB — VITAMIN D 25 HYDROXY (VIT D DEFICIENCY, FRACTURES): Vit D, 25-Hydroxy: 44.2 ng/mL (ref 30.0–100.0)

## 2019-11-10 MED FILL — ESCITALOPRAM 20 MG TABLET: 20 | 30 days supply | Qty: 30 | Fill #0

## 2019-11-10 MED FILL — RYBELSUS 7 MG TABS: 7 | 30 days supply | Qty: 30 | Fill #0

## 2019-11-13 ENCOUNTER — Ambulatory Visit (INDEPENDENT_AMBULATORY_CARE_PROVIDER_SITE_OTHER): Payer: 59 | Admitting: Family Medicine

## 2019-12-19 ENCOUNTER — Other Ambulatory Visit: Payer: Self-pay

## 2019-12-19 ENCOUNTER — Ambulatory Visit (INDEPENDENT_AMBULATORY_CARE_PROVIDER_SITE_OTHER): Payer: 59 | Admitting: Family Medicine

## 2019-12-19 ENCOUNTER — Encounter (INDEPENDENT_AMBULATORY_CARE_PROVIDER_SITE_OTHER): Payer: Self-pay | Admitting: Family Medicine

## 2019-12-19 VITALS — BP 146/101 | HR 94 | Temp 98.0°F | Ht 65.0 in | Wt 197.0 lb

## 2019-12-19 DIAGNOSIS — F3289 Other specified depressive episodes: Secondary | ICD-10-CM | POA: Diagnosis not present

## 2019-12-19 DIAGNOSIS — E669 Obesity, unspecified: Secondary | ICD-10-CM

## 2019-12-19 DIAGNOSIS — Z9189 Other specified personal risk factors, not elsewhere classified: Secondary | ICD-10-CM

## 2019-12-19 DIAGNOSIS — E66811 Obesity, class 1: Secondary | ICD-10-CM

## 2019-12-19 DIAGNOSIS — Z6832 Body mass index (BMI) 32.0-32.9, adult: Secondary | ICD-10-CM | POA: Diagnosis not present

## 2019-12-19 DIAGNOSIS — R7303 Prediabetes: Secondary | ICD-10-CM

## 2019-12-19 DIAGNOSIS — I10 Essential (primary) hypertension: Secondary | ICD-10-CM

## 2019-12-19 MED ORDER — CONTRAVE 8-90 MG PO TB12: 2.0000 | ORAL_TABLET | Freq: Two times a day (BID) | ORAL | 0 refills | Status: DC

## 2019-12-19 MED ORDER — ESCITALOPRAM OXALATE 20 MG PO TABS: 20.0000 mg | ORAL_TABLET | Freq: Every day | ORAL | 0 refills | Status: DC

## 2019-12-19 MED FILL — ESCITALOPRAM 20 MG TABLET: 20 | 30 days supply | Qty: 30 | Fill #0

## 2019-12-20 ENCOUNTER — Encounter (INDEPENDENT_AMBULATORY_CARE_PROVIDER_SITE_OTHER): Payer: Self-pay

## 2019-12-20 NOTE — Progress Notes (Signed)
Chief Complaint:   Yvonne [name] is here to discuss her progress with her Yvonne treatment plan along with follow-up of her Yvonne related diagnoses. Yvonne [name] is on the Category 3 Plan and states she is following her eating plan approximately 50% of the time. Yvonne [name] states she is walking 30 minutes 5 times per week.  Today's visit was #: 52 Starting weight: 204 lbs Starting date: 05/17/17 Today's weight: 197 lbs Today's date: 12/19/2019 Total lbs lost to date: 7 Total lbs lost since last in-office visit: 0  Interim History: Yvonne [name] struggled to stay on track of her Category 3 plan. She did some celebration eating over the holidays and wants to get back to her Category 3 plan.  Subjective:   1. Prediabetes Medications reviewed. Yvonne [name] started to have increased GI symptoms with Rybelsus, so she stopped taking it. Her recent A1c was steady at 5.9 on 11/09/19.  Lab Results  Component Value Date   HGBA1C 5.9 (H) 11/09/2019   HGBA1C 5.9 (H) 12/26/2018   HGBA1C 5.6 02/15/2018   Lab Results  Component Value Date   LDLCALC 130 (H) 11/09/2019   CREATININE 0.84 11/09/2019   2. Essential hypertension Review: taking medications as instructed, no medication side effects noted, no TIAs, no chest pain on exertion, no dyspnea on exertion, no swelling of ankles. Yvonne [name]'s blood pressure was elevated today. She notes increased stress at work.  BP Readings from Last 3 Encounters:  12/19/19 (!) 146/101  11/09/19 128/88  07/25/19 138/67   3. Other depression, with emotional eating  Yvonne [name] states that her mood is stable on Lexapro. She feels that she is dealing with work stress better and she is sleeping better. Yvonne [name] is struggling with emotional eating and using food for comfort to the extent that it is negatively impacting her health. She often snacks when she is not hungry. Yvonne [name] sometimes feels she is out of control and then feels guilty that she made poor food choices. She has  been working on behavior modification techniques to help reduce her emotional eating and has been somewhat successful.   4. At risk for heart disease [name] is at a higher than average risk for cardiovascular disease due to hypertension and Yvonne. Reviewed: no chest pain on exertion, no dyspnea on exertion, and no swelling of ankles.  Assessment/Plan:   1. Prediabetes Yvonne [name] has been given diabetes education by myself today. Good blood sugar control is important to decrease the likelihood of diabetic complications such as nephropathy, neuropathy, limb loss, blindness, coronary artery disease, and death. Intensive lifestyle modification including diet, exercise and weight loss were discussed as the first line treatment for diabetes. It is ok for Yvonne [name] to hold Rybelsus. She agrees to get back on her diet and we will continue to follow.  2. Essential hypertension Yvonne [name] is working on healthy weight loss and exercise to improve blood pressure control. We will watch for signs of hypotension as she continues her lifestyle modifications. Yvonne [name] agrees to check her blood pressure at home and will mychart her readings to me.  3. Other depression, with emotional eating  Behavior modification techniques were discussed today to help [facility] deal with her emotional/non-hunger eating behaviors. Orders and follow up as documented in patient record.   - escitalopram (LEXAPRO) 20 MG tablet; Take 1 tablet (20 mg total) by mouth daily.  Dispense: 30 tablet; Refill: 0  4. At risk for heart disease Yvonne [name] was given approximately 15 minutes of coronary artery disease prevention counseling  today. She is 49 y.o. female and has risk factors for heart disease including hypertension and Yvonne. We discussed intensive lifestyle modifications today with an emphasis on specific weight loss instructions and strategies.   5. Class 1 Yvonne with serious comorbidity and body mass index (BMI) of 32.0 to 32.9 in adult,  unspecified Yvonne type  - Naltrexone-buPROPion HCl ER (CONTRAVE) 8-90 MG TB12; Take 2 tablets by mouth 2 (two) times daily.  Dispense: 120 tablet; Refill: 0  Yvonne [name] is currently in the action stage of change. As such, her goal is to continue with weight loss efforts. She has agreed to the Category 3 Plan.    For substantial health benefits, adults should do at least 150 minutes (2 hours and 30 minutes) a week of moderate-intensity, or 75 minutes (1 hour and 15 minutes) a week of vigorous-intensity aerobic physical activity, or an equivalent combination of moderate- and vigorous-intensity aerobic activity. Aerobic activity should be performed in episodes of at least 10 minutes, and preferably, it should be spread throughout the week. Adults should also include muscle-strengthening activities that involve all major muscle groups on 2 or more days a week.  We discussed the following behavioral modification strategies today: increasing lean protein intake and decreasing simple carbohydrates.  Yvonne [name] has agreed to follow-up with our clinic in 3 weeks. She was informed of the importance of frequent follow-up visits to maximize her success with intensive lifestyle modifications for her multiple health conditions.   Objective:   Blood pressure (!) 146/101, pulse 94, temperature 98 F (36.7 C), temperature source Oral, height 5\' 5"  (1.651 m), weight 197 lb (89.4 kg), SpO2 97 %. Body mass index is 32.78 kg/m.  General: Cooperative, alert, well developed, in no acute distress. HEENT: Conjunctivae and lids unremarkable. Neck: No thyromegaly.  Cardiovascular: Regular rhythm.  Lungs: Normal work of breathing. Extremities: No edema.  Neurologic: No focal deficits.   Lab Results  Component Value Date   CREATININE 0.84 11/09/2019   BUN 10 11/09/2019   NA 140 11/09/2019   K 4.6 11/09/2019   CL 105 11/09/2019   CO2 19 (L) 11/09/2019   Lab Results  Component Value Date   ALT 15  11/09/2019   AST 15 11/09/2019   ALKPHOS 87 11/09/2019   BILITOT 0.3 11/09/2019   Lab Results  Component Value Date   HGBA1C 5.9 (H) 11/09/2019   HGBA1C 5.9 (H) 12/26/2018   HGBA1C 5.6 02/15/2018   HGBA1C 5.7 (H) 09/13/2017   HGBA1C 6.2 (H) 05/17/2017   Lab Results  Component Value Date   INSULIN 33.2 (H) 11/09/2019   INSULIN 9.3 12/26/2018   INSULIN 28.8 (H) 02/15/2018   INSULIN 35.7 (H) 09/13/2017   INSULIN 27.9 (H) 05/17/2017   Lab Results  Component Value Date   TSH 0.77 04/28/2019   Lab Results  Component Value Date   CHOL 209 (H) 11/09/2019   HDL 62 11/09/2019   LDLCALC 130 (H) 11/09/2019   TRIG 93 11/09/2019   CHOLHDL 3.4 04/28/2019   Lab Results  Component Value Date   WBC 11.6 (H) 07/06/2019   HGB 13.6 07/06/2019   HCT 42.1 07/06/2019   MCV 97.0 07/06/2019   PLT 306 07/06/2019   No results found for: IRON, TIBC, FERRITIN   Attestation Statements:   Reviewed by clinician on day of visit: allergies, medications, problem list, medical history, surgical history, family history, social history, and previous encounter notes.  IMarcille Blanco, CMA, am acting as Location manager for [employer] D.  Leafy Ro, MD  I have reviewed the above documentation for accuracy and completeness, and I agree with the above. -  Dennard Nip, MD

## 2019-12-25 ENCOUNTER — Encounter (INDEPENDENT_AMBULATORY_CARE_PROVIDER_SITE_OTHER): Payer: Self-pay

## 2019-12-25 MED FILL — CONTRAVE ER 8-90 MG TABLET: 8-90 | 30 days supply | Qty: 120 | Fill #0

## 2019-12-27 NOTE — Telephone Encounter (Signed)
Please advise 

## 2020-01-15 ENCOUNTER — Encounter (INDEPENDENT_AMBULATORY_CARE_PROVIDER_SITE_OTHER): Payer: Self-pay | Admitting: Family Medicine

## 2020-01-15 ENCOUNTER — Other Ambulatory Visit: Payer: Self-pay

## 2020-01-15 ENCOUNTER — Ambulatory Visit (INDEPENDENT_AMBULATORY_CARE_PROVIDER_SITE_OTHER): Payer: 59 | Admitting: Family Medicine

## 2020-01-15 VITALS — BP 133/90 | HR 93 | Temp 98.3°F | Ht 65.0 in | Wt 197.0 lb

## 2020-01-15 DIAGNOSIS — E669 Obesity, unspecified: Secondary | ICD-10-CM

## 2020-01-15 DIAGNOSIS — I10 Essential (primary) hypertension: Secondary | ICD-10-CM | POA: Diagnosis not present

## 2020-01-15 DIAGNOSIS — F3289 Other specified depressive episodes: Secondary | ICD-10-CM

## 2020-01-15 DIAGNOSIS — Z6832 Body mass index (BMI) 32.0-32.9, adult: Secondary | ICD-10-CM | POA: Diagnosis not present

## 2020-01-15 DIAGNOSIS — Z9189 Other specified personal risk factors, not elsewhere classified: Secondary | ICD-10-CM | POA: Diagnosis not present

## 2020-01-15 MED ORDER — HYDROCHLOROTHIAZIDE 12.5 MG PO TABS: 12.5000 mg | ORAL_TABLET | Freq: Every day | ORAL | 0 refills | Status: DC

## 2020-01-15 MED ORDER — ESCITALOPRAM OXALATE 20 MG PO TABS: 20.0000 mg | ORAL_TABLET | Freq: Every day | ORAL | 0 refills | Status: DC

## 2020-01-15 MED ORDER — CONTRAVE 8-90 MG PO TB12: 2.0000 | ORAL_TABLET | Freq: Two times a day (BID) | ORAL | 0 refills | Status: DC

## 2020-01-15 MED FILL — ESCITALOPRAM 20 MG TABLET: 20 | 30 days supply | Qty: 30 | Fill #0

## 2020-01-15 MED FILL — HYDROCHLOROTHIAZIDE 12.5 MG: 12.5 | 30 days supply | Qty: 30 | Fill #0

## 2020-01-15 NOTE — Progress Notes (Signed)
Chief Complaint:   OBESITY Yvonne [name] is here to discuss her progress with her obesity treatment plan along with follow-up of her obesity related diagnoses. Yvonne [name] is on the Category 3 Plan and states she is following her eating plan approximately 80% of the time. Yvonne [name] states she is walking for 30 minutes 4 times per week.  Today's visit was #: 36 Starting weight: 204 lbs Starting date: 05/17/17 Today's weight: 197 lbs Today's date: 01/15/2020 Total lbs lost to date: 7 Total lbs lost since last in-office visit: 0  Interim History: Yvonne [name] continues to do well with maintaining her weight. She is working extra hours for the [place] [employer] clinic, but she is still trying to stay in a routine for meal planning and self-care. She is tolerating Contrave well and denies insomnia.  Subjective:   1. Essential hypertension Larrisha's blood pressures have been running borderline, 120-145/80-95. She had been on blood pressure medications in the past.  2. Other depression, with emotional eating  Yvonne [name]'s mood has improved on Lexapro. She is still dealing with work stress, but she seems to have found a better work life balance.  3. At risk for heart disease Yvonne [name] is at a higher than average risk for cardiovascular disease due to obesity. Reviewed: no chest pain on exertion, no dyspnea on exertion, and no swelling of ankles.  Assessment/Plan:   1. Essential hypertension Yvonne [name] is working on healthy weight loss and exercise to improve blood pressure control. We will watch for signs of hypotension as she continues her lifestyle modifications. Yvonne [name] agreed to start hydrochlorothiazide 12.5 mg daily with no refill. We will recheck her blood pressure in 4-6 weeks.  - hydrochlorothiazide (HYDRODIURIL) 12.5 MG tablet; Take 1 tablet (12.5 mg total) by mouth daily.  Dispense: 30 tablet; Refill: 0  2. Other depression, with emotional eating  Behavior modification techniques were discussed today to help [name] deal  with her emotional/non-hunger eating behaviors. We will refill Lexapro for 1 month with no refill. Orders and follow up as documented in patient record.   - escitalopram (LEXAPRO) 20 MG tablet; Take 1 tablet (20 mg total) by mouth daily.  Dispense: 30 tablet; Refill: 0  3. At risk for heart disease Yvonne [name] was given approximately 15 minutes of coronary artery disease prevention counseling today. She is 49 y.o. female and has risk factors for heart disease including obesity. We discussed intensive lifestyle modifications today with an emphasis on specific weight loss instructions and strategies.   Repetitive spaced learning was employed today to elicit superior memory formation and behavioral change.  4. Class 1 obesity with serious comorbidity and body mass index (BMI) of 32.0 to 32.9 in adult, unspecified obesity type Yvonne [name] is currently in the action stage of change. As such, her goal is to continue with weight loss efforts. She has agreed to the Category 3 Plan.   We discussed various medication options to help Yvonne [name] with her weight loss efforts and we both agreed to continue Contrave and we will refill for 1 month.  Behavioral modification strategies: increasing lean protein intake and decreasing simple carbohydrates.  Yvonne [name] has agreed to follow-up with our clinic in 3 weeks. She was informed of the importance of frequent follow-up visits to maximize her success with intensive lifestyle modifications for her multiple health conditions.   Objective:   Blood pressure 133/90, pulse 93, temperature 98.3 F (36.8 C), temperature source Oral, height 5\' 5"  (1.651 m), weight 197 lb (89.4 kg), SpO2 96 %. Body mass index is  32.78 kg/m.  General: Cooperative, alert, well developed, in no acute distress. HEENT: Conjunctivae and lids unremarkable. Cardiovascular: Regular rhythm.  Lungs: Normal work of breathing. Neurologic: No focal deficits.   Lab Results  Component Value Date   CREATININE  0.84 11/09/2019   BUN 10 11/09/2019   NA 140 11/09/2019   K 4.6 11/09/2019   CL 105 11/09/2019   CO2 19 (L) 11/09/2019   Lab Results  Component Value Date   ALT 15 11/09/2019   AST 15 11/09/2019   ALKPHOS 87 11/09/2019   BILITOT 0.3 11/09/2019   Lab Results  Component Value Date   HGBA1C 5.9 (H) 11/09/2019   HGBA1C 5.9 (H) 12/26/2018   HGBA1C 5.6 02/15/2018   HGBA1C 5.7 (H) 09/13/2017   HGBA1C 6.2 (H) 05/17/2017   Lab Results  Component Value Date   INSULIN 33.2 (H) 11/09/2019   INSULIN 9.3 12/26/2018   INSULIN 28.8 (H) 02/15/2018   INSULIN 35.7 (H) 09/13/2017   INSULIN 27.9 (H) 05/17/2017   Lab Results  Component Value Date   TSH 0.77 04/28/2019   Lab Results  Component Value Date   CHOL 209 (H) 11/09/2019   HDL 62 11/09/2019   LDLCALC 130 (H) 11/09/2019   TRIG 93 11/09/2019   CHOLHDL 3.4 04/28/2019   Lab Results  Component Value Date   WBC 11.6 (H) 07/06/2019   HGB 13.6 07/06/2019   HCT 42.1 07/06/2019   MCV 97.0 07/06/2019   PLT 306 07/06/2019   No results found for: IRON, TIBC, FERRITIN  Attestation Statements:   Reviewed by clinician on day of visit: allergies, medications, problem list, medical history, surgical history, family history, social history, and previous encounter notes.   I, Trixie Dredge, am acting as transcriptionist for Dennard Nip, MD.  I have reviewed the above documentation for accuracy and completeness, and I agree with the above. -  Dennard Nip, MD

## 2020-01-29 MED FILL — JUNEL 1-20 TABLET: 1-20 | 84 days supply | Qty: 84 | Fill #2

## 2020-01-29 MED FILL — CONTRAVE ER 8-90 MG TABLET: 8-90 | 30 days supply | Qty: 120 | Fill #0

## 2020-02-07 ENCOUNTER — Encounter (INDEPENDENT_AMBULATORY_CARE_PROVIDER_SITE_OTHER): Payer: Self-pay | Admitting: Family Medicine

## 2020-02-07 ENCOUNTER — Telehealth (INDEPENDENT_AMBULATORY_CARE_PROVIDER_SITE_OTHER): Payer: 59 | Admitting: Family Medicine

## 2020-02-07 ENCOUNTER — Other Ambulatory Visit: Payer: Self-pay

## 2020-02-07 DIAGNOSIS — F418 Other specified anxiety disorders: Secondary | ICD-10-CM

## 2020-02-07 DIAGNOSIS — E669 Obesity, unspecified: Secondary | ICD-10-CM | POA: Diagnosis not present

## 2020-02-07 DIAGNOSIS — Z6832 Body mass index (BMI) 32.0-32.9, adult: Secondary | ICD-10-CM | POA: Diagnosis not present

## 2020-02-07 DIAGNOSIS — I10 Essential (primary) hypertension: Secondary | ICD-10-CM

## 2020-02-07 MED ORDER — HYDROCHLOROTHIAZIDE 12.5 MG PO TABS: 12.5000 mg | ORAL_TABLET | Freq: Every day | ORAL | 0 refills | Status: DC

## 2020-02-07 MED ORDER — CONTRAVE 8-90 MG PO TB12: 2.0000 | ORAL_TABLET | Freq: Two times a day (BID) | ORAL | 0 refills | Status: DC

## 2020-02-07 MED ORDER — ESCITALOPRAM OXALATE 20 MG PO TABS: 20.0000 mg | ORAL_TABLET | Freq: Every day | ORAL | 0 refills | Status: DC

## 2020-02-07 NOTE — Progress Notes (Signed)
TeleHealth Visit:  Due to the COVID-19 pandemic, this visit was completed with telemedicine (audio/video) technology to reduce patient and provider exposure as well as to preserve personal protective equipment.   Aby has verbally consented to this TeleHealth visit. The patient is located at home, the provider is located at the [facility] office. The participants in this visit include the listed provider and patient. The visit was conducted today via face time.   Chief Complaint: OBESITY Wendi is here to discuss her progress with her obesity treatment plan along with follow-up of her obesity related diagnoses. Beautifull is on the Category 3 Plan and states she is following her eating plan approximately 80% of the time. Yeilyn states she is walking for 30 minutes 5 times per week.  Today's visit was #: 23 Starting weight: 204 lbs Starting date: 05/17/2017  Interim History: Veronika continues to do well with weight loss and she thinks she has lost 2 more pounds. She states her weight at home was 195. She is on Category 3 approximately 80% of the time. She is on Contrave 1 tablet PO BID, and feels she is doing well on this dose.  Subjective:   1. Depression with anxiety Siria notes her mood has improved on Lexapro, and she feels her anxiety has calmed down. She denies problems with insomnia.  2. Essential hypertension Francely's blood pressure has done well with medications and weight loss. She denies lightheadedness or dizziness.  Assessment/Plan:   1. Depression with anxiety Behavior modification techniques were discussed today to help [facility] deal with her emotional/non-hunger eating behaviors. We will refill Lexapro with a 90 days supply with no refills. Orders and follow up as documented in patient record.   - escitalopram (LEXAPRO) 20 MG tablet; Take 1 tablet (20 mg total) by mouth daily.  Dispense: 30 tablet; Refill: 0  2. Essential hypertension Taejah is working  on healthy weight loss and exercise to improve blood pressure control. We will watch for signs of hypotension as she continues her lifestyle modifications. We will refill hydrochlorothiazide with a 90 day supply with no refills.  - hydrochlorothiazide (HYDRODIURIL) 12.5 MG tablet; Take 1 tablet (12.5 mg total) by mouth daily.  Dispense: 30 tablet; Refill: 0  3. Class 1 obesity with serious comorbidity and body mass index (BMI) of 32.0 to 32.9 in adult, unspecified obesity type Harinder is currently in the action stage of change. As such, her goal is to continue with weight loss efforts. She has agreed to the Category 3 Plan.   We discussed various medication options to help [facility] with her weight loss efforts and we both agreed to continue Contrave, and we will refill for 1 month (she is to continue at 1 tablet BID for now).  - Naltrexone-buPROPion HCl ER (CONTRAVE) 8-90 MG TB12; Take 2 tablets by mouth 2 (two) times daily.  Dispense: 120 tablet; Refill: 0  Exercise goals: Madolynn is to continue her current exercise as is.  Behavioral modification strategies: meal planning and cooking strategies and emotional eating strategies.  Dallyn has agreed to follow-up with our clinic in 3 weeks. She was informed of the importance of frequent follow-up visits to maximize her success with intensive lifestyle modifications for her multiple health conditions.  Objective:   VITALS: Per patient if applicable, see vitals. GENERAL: Alert and in no acute distress. CARDIOPULMONARY: No increased WOB. Speaking in clear sentences.  PSYCH: Pleasant and cooperative. Speech normal rate and rhythm. Affect is appropriate. Insight and judgement  are appropriate. Attention is focused, linear, and appropriate.  NEURO: Oriented as arrived to appointment on time with no prompting.   Lab Results  Component Value Date   CREATININE 0.84 11/09/2019   BUN 10 11/09/2019   NA 140 11/09/2019   K 4.6 11/09/2019   CL 105  11/09/2019   CO2 19 (L) 11/09/2019   Lab Results  Component Value Date   ALT 15 11/09/2019   AST 15 11/09/2019   ALKPHOS 87 11/09/2019   BILITOT 0.3 11/09/2019   Lab Results  Component Value Date   HGBA1C 5.9 (H) 11/09/2019   HGBA1C 5.9 (H) 12/26/2018   HGBA1C 5.6 02/15/2018   HGBA1C 5.7 (H) 09/13/2017   HGBA1C 6.2 (H) 05/17/2017   Lab Results  Component Value Date   INSULIN 33.2 (H) 11/09/2019   INSULIN 9.3 12/26/2018   INSULIN 28.8 (H) 02/15/2018   INSULIN 35.7 (H) 09/13/2017   INSULIN 27.9 (H) 05/17/2017   Lab Results  Component Value Date   TSH 0.77 04/28/2019   Lab Results  Component Value Date   CHOL 209 (H) 11/09/2019   HDL 62 11/09/2019   LDLCALC 130 (H) 11/09/2019   TRIG 93 11/09/2019   CHOLHDL 3.4 04/28/2019   Lab Results  Component Value Date   WBC 11.6 (H) 07/06/2019   HGB 13.6 07/06/2019   HCT 42.1 07/06/2019   MCV 97.0 07/06/2019   PLT 306 07/06/2019   No results found for: IRON, TIBC, FERRITIN  Attestation Statements:   Reviewed by clinician on day of visit: allergies, medications, problem list, medical history, surgical history, family history, social history, and previous encounter notes.   I, Trixie Dredge, am acting as transcriptionist for Dennard Nip, MD.  I have reviewed the above documentation for accuracy and completeness, and I agree with the above. - Dennard Nip, MD

## 2020-02-08 ENCOUNTER — Encounter: Payer: Self-pay | Admitting: Family

## 2020-02-08 MED FILL — ESCITALOPRAM 20 MG TABLET: 20 | 90 days supply | Qty: 90 | Fill #0

## 2020-02-08 MED FILL — HYDROCHLOROTHIAZIDE 12.5 MG: 12.5 | 90 days supply | Qty: 90 | Fill #0

## 2020-02-08 NOTE — Telephone Encounter (Signed)
Please check this

## 2020-02-23 MED FILL — JUNEL 1-20 TABLET: 1-20 | 63 days supply | Qty: 84 | Fill #3

## 2020-02-27 MED FILL — CONTRAVE ER 8-90 MG TABLET: 8-90 | 30 days supply | Qty: 120 | Fill #0

## 2020-03-06 ENCOUNTER — Telehealth (INDEPENDENT_AMBULATORY_CARE_PROVIDER_SITE_OTHER): Payer: 59 | Admitting: Family Medicine

## 2020-05-08 LAB — HM MAMMOGRAPHY

## 2020-05-24 ENCOUNTER — Encounter: Payer: 59 | Admitting: Family

## 2020-11-09 ENCOUNTER — Ambulatory Visit: Payer: Self-pay

## 2020-11-14 ENCOUNTER — Encounter (INDEPENDENT_AMBULATORY_CARE_PROVIDER_SITE_OTHER): Payer: Self-pay | Admitting: Family Medicine

## 2020-11-15 NOTE — Telephone Encounter (Signed)
Review this one directly with Dr Leafy Ro to see if she wants to follow protocol which is after 6 months they have to restart the program. She may want something different.

## 2020-12-07 DIAGNOSIS — N83519 Torsion of ovary and ovarian pedicle, unspecified side: Secondary | ICD-10-CM

## 2020-12-07 HISTORY — DX: Torsion of ovary and ovarian pedicle, unspecified side: N83.519

## 2020-12-13 ENCOUNTER — Inpatient Hospital Stay: Payer: 59 | Attending: Internal Medicine

## 2020-12-13 ENCOUNTER — Other Ambulatory Visit: Payer: Self-pay

## 2020-12-18 ENCOUNTER — Encounter (INDEPENDENT_AMBULATORY_CARE_PROVIDER_SITE_OTHER): Payer: Self-pay

## 2020-12-23 ENCOUNTER — Encounter (INDEPENDENT_AMBULATORY_CARE_PROVIDER_SITE_OTHER): Payer: Self-pay | Admitting: Family Medicine

## 2020-12-23 ENCOUNTER — Other Ambulatory Visit (INDEPENDENT_AMBULATORY_CARE_PROVIDER_SITE_OTHER): Payer: Self-pay | Admitting: Family Medicine

## 2020-12-23 ENCOUNTER — Telehealth (INDEPENDENT_AMBULATORY_CARE_PROVIDER_SITE_OTHER): Payer: 59 | Admitting: Family Medicine

## 2020-12-23 ENCOUNTER — Encounter (INDEPENDENT_AMBULATORY_CARE_PROVIDER_SITE_OTHER): Payer: Self-pay

## 2020-12-23 ENCOUNTER — Other Ambulatory Visit: Payer: Self-pay

## 2020-12-23 DIAGNOSIS — Z6832 Body mass index (BMI) 32.0-32.9, adult: Secondary | ICD-10-CM

## 2020-12-23 DIAGNOSIS — E669 Obesity, unspecified: Secondary | ICD-10-CM | POA: Diagnosis not present

## 2020-12-23 DIAGNOSIS — I10 Essential (primary) hypertension: Secondary | ICD-10-CM

## 2020-12-23 DIAGNOSIS — Z9189 Other specified personal risk factors, not elsewhere classified: Secondary | ICD-10-CM

## 2020-12-23 DIAGNOSIS — F418 Other specified anxiety disorders: Secondary | ICD-10-CM

## 2020-12-23 DIAGNOSIS — F3289 Other specified depressive episodes: Secondary | ICD-10-CM | POA: Diagnosis not present

## 2020-12-23 MED ORDER — ESCITALOPRAM OXALATE 20 MG PO TABS: 20.0000 mg | ORAL_TABLET | Freq: Every day | ORAL | 0 refills | Status: DC

## 2020-12-23 MED ORDER — HYDROCHLOROTHIAZIDE 12.5 MG PO TABS: 12.5000 mg | ORAL_TABLET | Freq: Every day | ORAL | 0 refills | Status: DC

## 2020-12-23 MED FILL — ESCITALOPRAM 20 MG TABLET: 20 | 30 days supply | Qty: 30 | Fill #0

## 2020-12-23 MED FILL — HYDROCHLOROTHIAZIDE 12.5 MG: 12.5 | 30 days supply | Qty: 30 | Fill #0

## 2020-12-24 ENCOUNTER — Encounter: Payer: Self-pay | Admitting: Family

## 2020-12-25 NOTE — Progress Notes (Signed)
TeleHealth Visit:  Due to the COVID-19 pandemic, this visit was completed with telemedicine (audio/video) technology to reduce patient and provider exposure as well as to preserve personal protective equipment.   Yvonne [name] has verbally consented to this TeleHealth visit. The patient is located at home, the provider is located at the [facility] office. The participants in this visit include the listed provider and patient. The visit was conducted today via MyChart video.   Chief Complaint: OBESITY Yvonne [name] is here to discuss her progress with her obesity treatment plan along with follow-up of her obesity related diagnoses. Yvonne [name] is on the Category 3 Plan and states she is following her eating plan approximately 0% of the time. Yvonne [name] states she is doing 0 minutes 0 times per week.  Today's visit was #: 88 Starting weight: 204 lbs Starting date: 05/17/2017  Interim History: Yvonne [name] last visit was approximately 1 year ago. She has moved to [place] to care for her sick father. She has had a lot of stress during this time. She hasn't been able to concentrate on weight loss during this time, but she has moved her father and herself back to [place].  Subjective:   1. Essential hypertension Cambre has been stable on hydrochlorothiazide previously, and she is due to have labs done with her primary care provider tomorrow. She denies signs of hypotension, and she is not checking her blood pressure at home currently.  2. Other depression, with emotional eating Yvonne [name] has been stable on Lexapro and she requests a refill today. She has been under a lot of personal, family, and work stress, but she is doing well now.  3. At risk for diabetes mellitus Yvonne [name] is at higher than average risk for developing diabetes due to obesity.   Assessment/Plan:   1. Essential hypertension Xinyi is working on healthy weight loss and exercise to improve blood pressure control. We will watch  for signs of hypotension as she continues her lifestyle modifications. We will refill hydrochlorothiazide for 1 month. She is to get labs and blood pressure checked at her primary care provider tomorrow.  - hydrochlorothiazide (HYDRODIURIL) 12.5 MG tablet; Take 1 tablet (12.5 mg total) by mouth daily.  Dispense: 30 tablet; Refill: 0  2. Other depression, with emotional eating Behavior modification techniques were discussed today to help Yvonne [name] deal with her emotional/non-hunger eating behaviors. We will refill Lexapro for 1 month, and will continue to monitor closely. Orders and follow up as documented in patient record.   - escitalopram (LEXAPRO) 20 MG tablet; Take 1 tablet (20 mg total) by mouth daily.  Dispense: 30 tablet; Refill: 0  3. At risk for diabetes mellitus Yvonne [name] was given approximately 15 minutes of diabetes education and counseling today. We discussed intensive lifestyle modifications today with an emphasis on weight loss as well as increasing exercise and decreasing simple carbohydrates in her diet. We also reviewed medication options with an emphasis on risk versus benefit of those discussed.   Repetitive spaced learning was employed today to elicit superior memory formation and behavioral change.  4. Class 1 obesity with serious comorbidity and body mass index (BMI) of 32.0 to 32.9 in adult, unspecified obesity type Yvonne [name] is currently in the action stage of change. As such, her goal is to continue with weight loss efforts. She has agreed to change to following a lower carbohydrate, vegetable and lean protein rich diet plan.   Behavioral modification strategies: meal planning and cooking strategies and better snacking choices.  [employer]  has agreed to follow-up with our clinic in 2 weeks. She was informed of the importance of frequent follow-up visits to maximize her success with intensive lifestyle modifications for her multiple health conditions.  Objective:   VITALS: Per  patient if applicable, see vitals. GENERAL: Alert and in no acute distress. CARDIOPULMONARY: No increased WOB. Speaking in clear sentences.  PSYCH: Pleasant and cooperative. Speech normal rate and rhythm. Affect is appropriate. Insight and judgement are appropriate. Attention is focused, linear, and appropriate.  NEURO: Oriented as arrived to appointment on time with no prompting.   Lab Results  Component Value Date   CREATININE 0.84 11/09/2019   BUN 10 11/09/2019   NA 140 11/09/2019   K 4.6 11/09/2019   CL 105 11/09/2019   CO2 19 (L) 11/09/2019   Lab Results  Component Value Date   ALT 15 11/09/2019   AST 15 11/09/2019   ALKPHOS 87 11/09/2019   BILITOT 0.3 11/09/2019   Lab Results  Component Value Date   HGBA1C 5.9 (H) 11/09/2019   HGBA1C 5.9 (H) 12/26/2018   HGBA1C 5.6 02/15/2018   HGBA1C 5.7 (H) 09/13/2017   HGBA1C 6.2 (H) 05/17/2017   Lab Results  Component Value Date   INSULIN 33.2 (H) 11/09/2019   INSULIN 9.3 12/26/2018   INSULIN 28.8 (H) 02/15/2018   INSULIN 35.7 (H) 09/13/2017   INSULIN 27.9 (H) 05/17/2017   Lab Results  Component Value Date   TSH 0.77 04/28/2019   Lab Results  Component Value Date   CHOL 209 (H) 11/09/2019   HDL 62 11/09/2019   LDLCALC 130 (H) 11/09/2019   TRIG 93 11/09/2019   CHOLHDL 3.4 04/28/2019   Lab Results  Component Value Date   WBC 11.6 (H) 07/06/2019   HGB 13.6 07/06/2019   HCT 42.1 07/06/2019   MCV 97.0 07/06/2019   PLT 306 07/06/2019   No results found for: IRON, TIBC, FERRITIN  Attestation Statements:   Reviewed by clinician on day of visit: allergies, medications, problem list, medical history, surgical history, family history, social history, and previous encounter notes.   I, Trixie Dredge, am acting as transcriptionist for Dennard Nip, MD.  I have reviewed the above documentation for accuracy and completeness, and I agree with the above. - Dennard Nip, MD

## 2020-12-31 ENCOUNTER — Other Ambulatory Visit (HOSPITAL_BASED_OUTPATIENT_CLINIC_OR_DEPARTMENT_OTHER): Payer: Self-pay | Admitting: Obstetrics and Gynecology

## 2020-12-31 DIAGNOSIS — Z3041 Encounter for surveillance of contraceptive pills: Secondary | ICD-10-CM | POA: Diagnosis not present

## 2020-12-31 DIAGNOSIS — Z1231 Encounter for screening mammogram for malignant neoplasm of breast: Secondary | ICD-10-CM | POA: Diagnosis not present

## 2020-12-31 DIAGNOSIS — Z01419 Encounter for gynecological examination (general) (routine) without abnormal findings: Secondary | ICD-10-CM | POA: Diagnosis not present

## 2020-12-31 DIAGNOSIS — Z124 Encounter for screening for malignant neoplasm of cervix: Secondary | ICD-10-CM | POA: Diagnosis not present

## 2020-12-31 LAB — HM PAP SMEAR: HM Pap smear: NORMAL

## 2020-12-31 MED FILL — JUNEL 1-20 TABLET: 1-20 | 84 days supply | Qty: 63 | Fill #0

## 2021-01-08 ENCOUNTER — Other Ambulatory Visit: Payer: Self-pay

## 2021-01-08 ENCOUNTER — Other Ambulatory Visit (INDEPENDENT_AMBULATORY_CARE_PROVIDER_SITE_OTHER): Payer: Self-pay | Admitting: Family Medicine

## 2021-01-08 ENCOUNTER — Ambulatory Visit (INDEPENDENT_AMBULATORY_CARE_PROVIDER_SITE_OTHER): Payer: 59 | Admitting: Family Medicine

## 2021-01-08 ENCOUNTER — Encounter (INDEPENDENT_AMBULATORY_CARE_PROVIDER_SITE_OTHER): Payer: Self-pay | Admitting: Family Medicine

## 2021-01-08 VITALS — BP 133/85 | HR 89 | Temp 97.9°F | Ht 65.0 in | Wt 214.0 lb

## 2021-01-08 DIAGNOSIS — Z6835 Body mass index (BMI) 35.0-35.9, adult: Secondary | ICD-10-CM | POA: Diagnosis not present

## 2021-01-08 DIAGNOSIS — I10 Essential (primary) hypertension: Secondary | ICD-10-CM

## 2021-01-08 DIAGNOSIS — Z9189 Other specified personal risk factors, not elsewhere classified: Secondary | ICD-10-CM

## 2021-01-08 DIAGNOSIS — E559 Vitamin D deficiency, unspecified: Secondary | ICD-10-CM | POA: Diagnosis not present

## 2021-01-08 DIAGNOSIS — R7303 Prediabetes: Secondary | ICD-10-CM

## 2021-01-08 DIAGNOSIS — E785 Hyperlipidemia, unspecified: Secondary | ICD-10-CM | POA: Diagnosis not present

## 2021-01-08 MED ORDER — RYBELSUS 3 MG PO TABS: ORAL_TABLET | ORAL | 0 refills | Status: DC

## 2021-01-08 MED FILL — RYBELSUS 3 MG TABS: 3 | 30 days supply | Qty: 30 | Fill #0

## 2021-01-09 LAB — COMPREHENSIVE METABOLIC PANEL
ALT: 16 IU/L (ref 0–32)
AST: 15 IU/L (ref 0–40)
Albumin/Globulin Ratio: 1.5 (ref 1.2–2.2)
Albumin: 4.1 g/dL (ref 3.8–4.8)
Alkaline Phosphatase: 90 IU/L (ref 44–121)
BUN/Creatinine Ratio: 14 (ref 9–23)
BUN: 12 mg/dL (ref 6–24)
Bilirubin Total: <0.2 mg/dL (ref 0.0–1.2)
CO2: 21 mmol/L (ref 20–29)
Calcium: 10.1 mg/dL (ref 8.7–10.2)
Chloride: 103 mmol/L (ref 96–106)
Creatinine, Ser: 0.84 mg/dL (ref 0.57–1.00)
GFR calc Af Amer: 95 mL/min/{1.73_m2} (ref >59)
GFR calc non Af Amer: 82 mL/min/{1.73_m2} (ref >59)
Globulin, Total: 2.8 g/dL (ref 1.5–4.5)
Glucose: 101 mg/dL — ABNORMAL HIGH (ref 65–99)
Potassium: 4.7 mmol/L (ref 3.5–5.2)
Sodium: 141 mmol/L (ref 134–144)
Total Protein: 6.9 g/dL (ref 6.0–8.5)

## 2021-01-09 LAB — LIPID PANEL WITH LDL/HDL RATIO
Cholesterol, Total: 190 mg/dL (ref 100–199)
HDL: 60 mg/dL (ref >39)
LDL Chol Calc (NIH): 112 mg/dL — ABNORMAL HIGH (ref 0–99)
LDL/HDL Ratio: 1.9 ratio (ref 0.0–3.2)
Triglycerides: 100 mg/dL (ref 0–149)
VLDL Cholesterol Cal: 18 mg/dL (ref 5–40)

## 2021-01-09 LAB — INSULIN, RANDOM: INSULIN: 37.3 u[IU]/mL — ABNORMAL HIGH (ref 2.6–24.9)

## 2021-01-09 LAB — HEMOGLOBIN A1C
Est. average glucose Bld gHb Est-mCnc: 128 mg/dL
Hgb A1c MFr Bld: 6.1 % — ABNORMAL HIGH (ref 4.8–5.6)

## 2021-01-09 LAB — VITAMIN D 25 HYDROXY (VIT D DEFICIENCY, FRACTURES): Vit D, 25-Hydroxy: 21.8 ng/mL — ABNORMAL LOW (ref 30.0–100.0)

## 2021-01-09 NOTE — Progress Notes (Signed)
Chief Complaint:   OBESITY Perline is here to discuss her progress with her obesity treatment plan along with follow-up of her obesity related diagnoses. Arminda is on following a lower carbohydrate, vegetable and lean protein rich diet plan and states she is following her eating plan approximately 20% of the time. 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   Today's visit was #: 29 Starting weight: 204 lbs Starting date: 05/17/2017 Today's weight: 214 lbs Today's date: 01/08/2021 Total lbs lost to date: 0 Total lbs lost since last in-office visit: 0  Interim History: Tanny's last office  Visit was 1 year ago. She is back in town and she is ready to get back on track. She is struggling with increased cravings and stress eating, and she is drinking a lot of liquid calories. She is open to looking at medications options.  Subjective:   1. Pre-diabetes Carolle is due to have labs checked. She notes increased polyphagia. She is not on metformin.  2. Vitamin D deficiency Saundra is on Vit D, and she is due for labs.  3. Essential hypertension Africa's blood pressure is well controlled, and she is due for labs. She is on hydrochlorothiazide so she is at high risk of electrolyte imbalance.  4. Hyperlipidemia, unspecified hyperlipidemia type Keaundra has a history of elevated LDL, and she is working on diet and weight loss. She is not on a statin, and she denies chest pain.  5. At risk for diabetes mellitus Deshia is at higher than average risk for developing diabetes due to obesity.   Assessment/Plan:   1. Pre-diabetes Davette will continue to work on weight loss, exercise, and decreasing simple carbohydrates to help decrease the risk of diabetes. We will check labs today. Elene agreed to start Rybelsus 3 mg q AM with no refills.  - Hemoglobin A1c - Insulin, random - Semaglutide (RYBELSUS) 3 MG TABS; Take 1 tablet by mouth once daily in the morning with 4oz. Water 30  minutes prior to other meds or food.  Dispense: 30 tablet; Refill: 0  2. Vitamin D deficiency Low Vitamin D level contributes to fatigue and are associated with obesity, breast, and colon cancer. We will check labs today. Adiel will follow-up for routine testing of Vitamin D, at least 2-3 times per year to avoid over-replacement.  - VITAMIN D 25 Hydroxy (Vit-D Deficiency, Fractures)  3. Essential hypertension Fannie is working on healthy weight loss and exercise to improve blood pressure control. We will watch for signs of hypotension as she continues her lifestyle modifications. We will check labs today.  - Comprehensive metabolic panel  4. Hyperlipidemia, unspecified hyperlipidemia type Cardiovascular risk and specific lipid/LDL goals reviewed. We discussed several lifestyle modifications today. We will check labs today. Kennya will continue to work on diet, exercise and weight loss efforts. Orders and follow up as documented in patient record.   - Lipid Panel With LDL/HDL Ratio  5. At risk for diabetes mellitus Hampton was given approximately 15 minutes of diabetes education and counseling today. We discussed intensive lifestyle modifications today with an emphasis on weight loss as well as increasing exercise and decreasing simple carbohydrates in her diet. We also reviewed medication options with an emphasis on risk versus benefit of those discussed.   Repetitive spaced learning was employed today to elicit superior memory formation and behavioral change.  6. Class 2 severe obesity with serious comorbidity and body mass index (BMI) of 35.0 to 35.9 in adult, unspecified obesity  type [facility]) Emiline is currently in the action stage of change. As such, her goal is to get back to weightloss efforts . She has agreed to following a lower carbohydrate, vegetable and lean protein rich diet plan.   We discussed various medication options to help [facility] with her weight loss efforts and we both  agreed that we will start a GLP-1 for her pre-diabetes which should also help with weight loss.  Exercise goals: As is.  Behavioral modification strategies: increasing lean protein intake.  Dedria has agreed to follow-up with our clinic in 2 weeks. She was informed of the importance of frequent follow-up visits to maximize her success with intensive lifestyle modifications for her multiple health conditions.   Ladaysha was informed we would discuss her lab results at her next visit unless there is a critical issue that needs to be addressed sooner. Quinnetta agreed to keep her next visit at the agreed upon time to discuss these results.  Objective:   Blood pressure 133/85, pulse 89, temperature 97.9 F (36.6 C), height 5\' 5"  (1.651 m), weight 214 lb (97.1 kg), SpO2 97 %. Body mass index is 35.61 kg/m.  General: Cooperative, alert, well developed, in no acute distress. HEENT: Conjunctivae and lids unremarkable. Cardiovascular: Regular rhythm.  Lungs: Normal work of breathing. Neurologic: No focal deficits.   Lab Results  Component Value Date   CREATININE 0.84 01/08/2021   BUN 12 01/08/2021   NA 141 01/08/2021   K 4.7 01/08/2021   CL 103 01/08/2021   CO2 21 01/08/2021   Lab Results  Component Value Date   ALT 16 01/08/2021   AST 15 01/08/2021   ALKPHOS 90 01/08/2021   BILITOT <0.2 01/08/2021   Lab Results  Component Value Date   HGBA1C 6.1 (H) 01/08/2021   HGBA1C 5.9 (H) 11/09/2019   HGBA1C 5.9 (H) 12/26/2018   HGBA1C 5.6 02/15/2018   HGBA1C 5.7 (H) 09/13/2017   Lab Results  Component Value Date   INSULIN 37.3 (H) 01/08/2021   INSULIN 33.2 (H) 11/09/2019   INSULIN 9.3 12/26/2018   INSULIN 28.8 (H) 02/15/2018   INSULIN 35.7 (H) 09/13/2017   Lab Results  Component Value Date   TSH 0.77 04/28/2019   Lab Results  Component Value Date   CHOL 190 01/08/2021   HDL 60 01/08/2021   LDLCALC 112 (H) 01/08/2021   TRIG 100 01/08/2021   CHOLHDL 3.4 04/28/2019   Lab  Results  Component Value Date   WBC 11.6 (H) 07/06/2019   HGB 13.6 07/06/2019   HCT 42.1 07/06/2019   MCV 97.0 07/06/2019   PLT 306 07/06/2019   No results found for: IRON, TIBC, FERRITIN  Attestation Statements:   Reviewed by clinician on day of visit: allergies, medications, problem list, medical history, surgical history, family history, social history, and previous encounter notes.   I, Trixie Dredge, am acting as transcriptionist for Dennard Nip, MD.  I have reviewed the above documentation for accuracy and completeness, and I agree with the above. -  Dennard Nip, MD

## 2021-01-10 ENCOUNTER — Other Ambulatory Visit: Payer: Self-pay

## 2021-01-10 ENCOUNTER — Encounter: Payer: Self-pay | Admitting: Family

## 2021-01-10 ENCOUNTER — Telehealth: Payer: Self-pay | Admitting: Family

## 2021-01-10 ENCOUNTER — Ambulatory Visit (INDEPENDENT_AMBULATORY_CARE_PROVIDER_SITE_OTHER): Payer: 59 | Admitting: Family

## 2021-01-10 VITALS — BP 112/81 | HR 101 | Temp 98.8°F | Resp 16 | Ht 65.0 in | Wt 217.0 lb

## 2021-01-10 DIAGNOSIS — Z807 Family history of other malignant neoplasms of lymphoid, hematopoietic and related tissues: Secondary | ICD-10-CM | POA: Diagnosis not present

## 2021-01-10 DIAGNOSIS — Z0000 Encounter for general adult medical examination without abnormal findings: Secondary | ICD-10-CM | POA: Diagnosis not present

## 2021-01-10 NOTE — Telephone Encounter (Signed)
Please call Dr. Harvie Bridge office and request copy of pap and solis to request copy of pap.

## 2021-01-10 NOTE — Progress Notes (Signed)
Subjective:    Patient ID: Yvonne [name], female    DOB: Aug 24, 1971, 50 y.o.   MRN: [id number]  HPI  Patient presents today for complete physical.  Immunizations:  10/21 flu shot, completed pfizer x 3 tdap 2015 Diet: she re-established with Dr. Leafy Ro at [facility] weight and wellness Wt Readings from Last 3 Encounters:  01/10/21 217 lb (98.4 kg)  01/08/21 214 lb (97.1 kg)  01/15/20 197 lb (89.4 kg)  Exercise: walks at work Colonoscopy: 2019 Pap Smear: 2020 Mammogram: 6/21 Vision: up to date Dental: scheduled    Review of Systems  Constitutional: Negative for unexpected weight change.  HENT: Negative for hearing loss and rhinorrhea.   Eyes: Negative for visual disturbance.  Respiratory: Negative for cough and shortness of breath.   Cardiovascular: Negative for chest pain.  Gastrointestinal: Negative for constipation and diarrhea.  Genitourinary: Negative for dysuria, frequency and menstrual problem.  Musculoskeletal: Negative for arthralgias and myalgias.  Skin: Negative for rash.  Neurological: Negative for headaches.  Hematological: Negative for adenopathy.  Psychiatric/Behavioral:       Denies depression/anxiety       Past Medical History:  Diagnosis Date  . Anemia   . Arthritis   . High blood pressure   . Joint pain   . Kidney stones   . Microscopic hematuria   . Vitamin D deficiency      Social History   Socioeconomic History  . Marital status: Married    Spouse name: Not on file  . Number of children: 3  . Years of education: Not on file  . Highest education level: Not on file  Occupational History  . 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 Smoking status: Never Smoker  . Smokeless tobacco: Never Used  Vaping Use  . Vaping Use: Never used  Substance and Sexual Activity  . Alcohol use: No  . Drug use: No  . Sexual activity: Not on file  Other Topics Concern  . 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 NO PAST SURGERIES     Denies surgical history    Family History  Problem Relation Age of Onset  . Healthy Father   . Congestive Heart Failure Father   . Heart disease Father   . Diabetes Father   . Other Mother        deceased from multiple myelomas  . Cancer Mother   . Colon cancer Neg Hx   . Esophageal cancer Neg Hx   . Liver cancer Neg Hx   . Pancreatic cancer Neg Hx   . Rectal cancer Neg Hx   . Stomach cancer Neg Hx     No Known Allergies  Current Outpatient Medications on File Prior to Visit  Medication Sig Dispense Refill  . escitalopram (LEXAPRO) 20 MG tablet Take 1 tablet (20 mg total) by mouth daily. 30 tablet 0  . hydrochlorothiazide (HYDRODIURIL) 12.5 MG  tablet Take 1 tablet (12.5 mg total) by mouth daily. 30 tablet 0  . norethindrone-ethinyl estradiol (LOESTRIN FE) 1-20 MG-MCG tablet Take 1 tablet by mouth daily.    . Semaglutide (RYBELSUS) 3 MG TABS Take 1 tablet by mouth once daily in the morning with 4oz. Water 30 minutes prior to other meds or food. 30 tablet 0   Current Facility-Administered Medications on File Prior to Visit  Medication Dose Route Frequency Provider Last Rate Last Admin  . 0.9 %  sodium chloride infusion  500 mL Intravenous Once Pyrtle, Lajuan Lines, MD        BP 112/81 (BP Location: Right Arm, Patient Position: Sitting, Cuff Size: Large)   Pulse (!) 101   Temp 98.8 F (37.1 C) (Oral)   Resp 16   Ht $R'5\' 5"'ci$  (1.651 m)   Wt 217 lb (98.4 kg)   SpO2 98%   BMI 36.11 kg/m    Objective:    Physical Exam Physical Exam  Constitutional: She is oriented to person, place, and time. She appears well-developed and well-nourished. No distress.  HENT:  Head: Normocephalic and atraumatic.  Right Ear: Tympanic membrane and ear canal normal.  Left Ear: Tympanic membrane and ear canal normal.  Mouth/Throat: not examined, pt wearing mask Eyes: Pupils are equal, round, and reactive to light. No scleral icterus.  Neck: Normal range of motion. No thyromegaly present.  Cardiovascular: Normal rate and regular rhythm.   No murmur heard. Pulmonary/Chest: Effort normal and breath sounds normal. No respiratory distress. He has no wheezes. She has no rales. She exhibits no tenderness.  Abdominal: Soft. Bowel sounds are normal. She exhibits no distension and no mass. There is no tenderness. There is no rebound and no guarding.  Musculoskeletal: She exhibits no edema.  Lymphadenopathy:    She has no cervical adenopathy.  Neurological: She is alert and oriented to person, place, and time. She has normal patellar reflexes. She exhibits normal muscle tone. Coordination normal.  Skin: Skin is warm and dry.  Psychiatric: She has a normal mood and affect. Her behavior is normal. Judgment and thought content normal.  Breast/pelvic:deferred           Assessment & Plan:   Preventative care- She is very motivated to lose weight. We discussed adding 30 minutes of CV exercise 5 days a week.  Pap/mammo up to date. Dental/vision:  Up to date.  Immunizations reviewed and up to date.  Family history of multiple myeloma- obtain SPEP at pt request.  This visit occurred during the SARS-CoV-2 public health emergency.  Safety protocols were in place, including screening questions prior to the visit, additional usage of staff PPE, and extensive cleaning of exam room while observing appropriate contact time as indicated for disinfecting solutions.         Assessment & Plan:

## 2021-01-10 NOTE — Patient Instructions (Signed)
Preventive Care 49-49 Years Old, Female Preventive care refers to lifestyle choices and visits with your health care provider that can promote health and wellness. This includes:  A yearly physical exam. This is also called an annual wellness visit.  Regular dental and eye exams.  Immunizations.  Screening for certain conditions.  Healthy lifestyle choices, such as: ? Eating a healthy diet. ? Getting regular exercise. ? Not using drugs or products that contain nicotine and tobacco. ? Limiting alcohol use. What can I expect for my preventive care visit? Physical exam Your health care provider will check your:  Height and weight. These may be used to calculate your BMI (body mass index). BMI is a measurement that tells if you are at a healthy weight.  Heart rate and blood pressure.  Body temperature.  Skin for abnormal spots. Counseling Your health care provider may ask you questions about your:  Past medical problems.  Family's medical history.  Alcohol, tobacco, and drug use.  Emotional well-being.  Home life and relationship well-being.  Sexual activity.  Diet, exercise, and sleep habits.  Work and work Statistician.  Access to firearms.  Method of birth control.  Menstrual cycle.  Pregnancy history. What immunizations do I need? Vaccines are usually given at various ages, according to a schedule. Your health care provider will recommend vaccines for you based on your age, medical history, and lifestyle or other factors, such as travel or where you work.   What tests do I need? Blood tests  Lipid and cholesterol levels. These may be checked every 5 years, or more often if you are over 49 years old.  Hepatitis C test.  Hepatitis B test. Screening  Lung cancer screening. You may have this screening every year starting at age 49 if you have a 30-pack-year history of smoking and currently smoke or have quit within the past 15 years.  Colorectal cancer  screening. ? All adults should have this screening starting at age 49 and continuing until age 17. ? Your health care provider may recommend screening at age 49 if you are at increased risk. ? You will have tests every 1-10 years, depending on your results and the type of screening test.  Diabetes screening. ? This is done by checking your blood sugar (glucose) after you have not eaten for a while (fasting). ? You may have this done every 1-3 years.  Mammogram. ? This may be done every 1-2 years. ? Talk with your health care provider about when you should start having regular mammograms. This may depend on whether you have a family history of breast cancer.  BRCA-related cancer screening. This may be done if you have a family history of breast, ovarian, tubal, or peritoneal cancers.  Pelvic exam and Pap test. ? This may be done every 3 years starting at age 10. ? Starting at age 11, this may be done every 5 years if you have a Pap test in combination with an HPV test. Other tests  STD (sexually transmitted disease) testing, if you are at risk.  Bone density scan. This is done to screen for osteoporosis. You may have this scan if you are at high risk for osteoporosis. Talk with your health care provider about your test results, treatment options, and if necessary, the need for more tests. Follow these instructions at home: Eating and drinking  Eat a diet that includes fresh fruits and vegetables, whole grains, lean protein, and low-fat dairy products.  Take vitamin and mineral supplements  as recommended by your health care provider.  Do not drink alcohol if: ? Your health care provider tells you not to drink. ? You are pregnant, may be pregnant, or are planning to become pregnant.  If you drink alcohol: ? Limit how much you have to 0-1 drink a day. ? Be aware of how much alcohol is in your drink. In the [place] one drink equals one 12 oz bottle of beer (355 mL), one 5 oz glass of  wine (148 mL), or one 1 oz glass of hard liquor (44 mL).   Lifestyle  Take daily care of your teeth and gums. Brush your teeth every morning and night with fluoride toothpaste. Floss one time each day.  Stay active. Exercise for at least 30 minutes 5 or more days each week.  Do not use any products that contain nicotine or tobacco, such as cigarettes, e-cigarettes, and chewing tobacco. If you need help quitting, ask your health care provider.  Do not use drugs.  If you are sexually active, practice safe sex. Use a condom or other form of protection to prevent STIs (sexually transmitted infections).  If you do not wish to become pregnant, use a form of birth control. If you plan to become pregnant, see your health care provider for a prepregnancy visit.  If told by your health care provider, take low-dose aspirin daily starting at age 50.  Find healthy ways to cope with stress, such as: ? Meditation, yoga, or listening to music. ? Journaling. ? Talking to a trusted person. ? Spending time with friends and family. Safety  Always wear your seat belt while driving or riding in a vehicle.  Do not drive: ? If you have been drinking alcohol. Do not ride with someone who has been drinking. ? When you are tired or distracted. ? While texting.  Wear a helmet and other protective equipment during sports activities.  If you have firearms in your house, make sure you follow all gun safety procedures. What's next?  Visit your health care provider once a year for an annual wellness visit.  Ask your health care provider how often you should have your eyes and teeth checked.  Stay up to date on all vaccines. This information is not intended to replace advice given to you by your health care provider. Make sure you discuss any questions you have with your health care provider. Document Revised: 08/27/2020 Document Reviewed: 08/04/2018 Elsevier Patient Education  2021 Elsevier Inc.  

## 2021-01-10 NOTE — Telephone Encounter (Signed)
Requests faxed

## 2021-01-13 ENCOUNTER — Other Ambulatory Visit (HOSPITAL_BASED_OUTPATIENT_CLINIC_OR_DEPARTMENT_OTHER): Payer: Self-pay | Admitting: Obstetrics and Gynecology

## 2021-01-13 LAB — PROTEIN ELECTROPHORESIS, SERUM
Albumin ELP: 3.9 g/dL (ref 3.8–4.8)
Alpha 1: 0.4 g/dL — ABNORMAL HIGH (ref 0.2–0.3)
Alpha 2: 0.8 g/dL (ref 0.5–0.9)
Beta 2: 0.4 g/dL (ref 0.2–0.5)
Beta Globulin: 0.5 g/dL (ref 0.4–0.6)
Gamma Globulin: 1.2 g/dL (ref 0.8–1.7)
Total Protein: 7.2 g/dL (ref 6.1–8.1)

## 2021-01-13 MED FILL — JUNEL 1-20 TABLET: 1-20 | 84 days supply | Qty: 84 | Fill #0

## 2021-01-17 ENCOUNTER — Encounter: Payer: Self-pay | Admitting: *Deleted

## 2021-01-22 ENCOUNTER — Encounter (INDEPENDENT_AMBULATORY_CARE_PROVIDER_SITE_OTHER): Payer: Self-pay | Admitting: Adult Health

## 2021-01-22 ENCOUNTER — Ambulatory Visit (INDEPENDENT_AMBULATORY_CARE_PROVIDER_SITE_OTHER): Payer: 59 | Admitting: Adult Health

## 2021-01-22 ENCOUNTER — Other Ambulatory Visit (INDEPENDENT_AMBULATORY_CARE_PROVIDER_SITE_OTHER): Payer: Self-pay | Admitting: Adult Health

## 2021-01-22 ENCOUNTER — Other Ambulatory Visit: Payer: Self-pay

## 2021-01-22 VITALS — BP 100/71 | HR 105 | Temp 97.9°F | Ht 65.0 in | Wt 206.0 lb

## 2021-01-22 DIAGNOSIS — R7303 Prediabetes: Secondary | ICD-10-CM

## 2021-01-22 DIAGNOSIS — E559 Vitamin D deficiency, unspecified: Secondary | ICD-10-CM | POA: Diagnosis not present

## 2021-01-22 DIAGNOSIS — E669 Obesity, unspecified: Secondary | ICD-10-CM

## 2021-01-22 DIAGNOSIS — E7800 Pure hypercholesterolemia, unspecified: Secondary | ICD-10-CM | POA: Diagnosis not present

## 2021-01-22 DIAGNOSIS — Z6834 Body mass index (BMI) 34.0-34.9, adult: Secondary | ICD-10-CM

## 2021-01-22 DIAGNOSIS — I10 Essential (primary) hypertension: Secondary | ICD-10-CM

## 2021-01-22 MED ORDER — VITAMIN D (ERGOCALCIFEROL) 1.25 MG (50000 UNIT) PO CAPS: 50000.0000 [IU] | ORAL_CAPSULE | ORAL | 0 refills | Status: DC

## 2021-01-22 MED FILL — VIT D2 1.25 MG (50,000 UNIT: 1.25 MG | 28 days supply | Qty: 4 | Fill #0

## 2021-01-23 NOTE — Progress Notes (Addendum)
Chief Complaint:   OBESITY Yvonne [name] is here to discuss her progress with her obesity treatment plan along with follow-up of her obesity related diagnoses. Yvonne [name] is on following a lower carbohydrate, vegetable and lean protein rich diet plan and states she is following her eating plan approximately 85% of the time. Yvonne [name] states she is walking 15-30 minutes 5 times per week.  Today's visit was #: 45 Starting weight: 204 lbs Starting date: 05/17/2017 Today's weight: 206 lbs Today's date: 01/22/2021 Total lbs lost to date: 0 Total lbs lost since last in-office visit: 8 lbs  Interim History: Yvonne [name] was started on Rybelsus 3 mg on 01/10/2021. Pt denies mass in neck, dysphagia, dyspepsia, persistent hoarseness, or constipation. She continues to enjoy foods and structure of low carb meal plan. She packs all food when she works- [employer] and snack- cucumber/tomato.  Subjective:   1. Pre-diabetes Worsening. Discussed labs with patient today. 01/08/2021 A1c 6.1 and insulin level of 37.3 with elevated blood glucose. Pt was started on Rybelsus 3 mg daily on 01/08/2021.   Lab Results  Component Value Date   HGBA1C 6.1 (H) 01/08/2021   Lab Results  Component Value Date   INSULIN 37.3 (H) 01/08/2021   INSULIN 33.2 (H) 11/09/2019   INSULIN 9.3 12/26/2018   INSULIN 28.8 (H) 02/15/2018   INSULIN 35.7 (H) 09/13/2017    2. Vitamin D deficiency Discussed labs with patient today. Yvonne [name]'s Vitamin D level was 21.8 on 01/08/2021, which is below goal of 50.    Ref. Range 01/08/2021 07:40  Vitamin D, 25-Hydroxy Latest Ref Range: 30.0 - 100.0 ng/mL 21.8 (L)   3. Pure hypercholesterolemia 01/08/2021 Lipid panel resulted an improved total and HDL. However, pt's LDL is still above goal at 112 (decrease from 130 on 11/08/2020).  Lab Results  Component Value Date   ALT 16 01/08/2021   AST 15 01/08/2021   ALKPHOS 90 01/08/2021   BILITOT <0.2 01/08/2021   Lab Results  Component Value Date   CHOL 190  01/08/2021   HDL 60 01/08/2021   LDLCALC 112 (H) 01/08/2021   TRIG 100 01/08/2021   CHOLHDL 3.4 04/28/2019    4. Essential hypertension 01/08/2021 CMP is stable and electrolytes are within normal limits. Pt is on HCTZ 12.5 mg daily.  BP Readings from Last 3 Encounters:  01/22/21 100/71  01/10/21 112/81  01/08/21 133/85    Assessment/Plan:   1. Pre-diabetes Yvonne [name] will continue to work on weight loss, exercise, and decreasing simple carbohydrates to help decrease the risk of diabetes. Continue Rybelsus 3 mg daily. No refill needed today.  2. Vitamin D deficiency Low Vitamin D level contributes to fatigue and are associated with obesity, breast, and colon cancer. She agrees to start to take prescription Vitamin D @50 ,000 IU every week and will follow-up for routine testing of Vitamin D, at least 2-3 times per year to avoid over-replacement.  3. Pure hypercholesterolemia Cardiovascular risk and specific lipid/LDL goals reviewed.  We discussed several lifestyle modifications today and Yvonne [name] will continue to work on diet, exercise and weight loss efforts. Orders and follow up as documented in patient record. Continue to decrease saturated fats and regular walking.  Counseling Intensive lifestyle modifications are the first line treatment for this issue. . Dietary changes: Increase soluble fiber. Decrease simple carbohydrates. . Exercise changes: Moderate to vigorous-intensity aerobic activity 150 minutes per week if tolerated. . Lipid-lowering medications: see documented in medical record.  4. Essential hypertension Yvonne [name] is working on healthy weight loss  and exercise to improve blood pressure control. We will watch for signs of hypotension as she continues her lifestyle modifications. Continue HCTZ 12.5 mg daily. No refill needed today.  5. Class 1 obesity without serious comorbidity with body mass index (BMI) of 34.0 to 34.9 in adult, unspecified obesity type Yvonne [name] is currently in  the action stage of change. As such, her goal is to continue with weight loss efforts. She has agreed to following a lower carbohydrate, vegetable and lean protein rich diet plan.   Exercise goals: As is  Behavioral modification strategies: increasing lean protein intake, meal planning and cooking strategies and planning for success.  Yvonne [name] has agreed to follow-up with our clinic in 2 weeks. She was informed of the importance of frequent follow-up visits to maximize her success with intensive lifestyle modifications for her multiple health conditions.   Objective:   Blood pressure 100/71, pulse (!) 105, temperature 97.9 F (36.6 C), height 5\' 5"  (1.651 m), weight 206 lb (93.4 kg), SpO2 96 %. Body mass index is 34.28 kg/m.  General: Cooperative, alert, well developed, in no acute distress. HEENT: Conjunctivae and lids unremarkable. Cardiovascular: Regular rhythm.  Lungs: Normal work of breathing. Neurologic: No focal deficits.   Lab Results  Component Value Date   CREATININE 0.84 01/08/2021   BUN 12 01/08/2021   NA 141 01/08/2021   K 4.7 01/08/2021   CL 103 01/08/2021   CO2 21 01/08/2021   Lab Results  Component Value Date   ALT 16 01/08/2021   AST 15 01/08/2021   ALKPHOS 90 01/08/2021   BILITOT <0.2 01/08/2021   Lab Results  Component Value Date   HGBA1C 6.1 (H) 01/08/2021   HGBA1C 5.9 (H) 11/09/2019   HGBA1C 5.9 (H) 12/26/2018   HGBA1C 5.6 02/15/2018   HGBA1C 5.7 (H) 09/13/2017   Lab Results  Component Value Date   INSULIN 37.3 (H) 01/08/2021   INSULIN 33.2 (H) 11/09/2019   INSULIN 9.3 12/26/2018   INSULIN 28.8 (H) 02/15/2018   INSULIN 35.7 (H) 09/13/2017   Lab Results  Component Value Date   TSH 0.77 04/28/2019   Lab Results  Component Value Date   CHOL 190 01/08/2021   HDL 60 01/08/2021   LDLCALC 112 (H) 01/08/2021   TRIG 100 01/08/2021   CHOLHDL 3.4 04/28/2019   Lab Results  Component Value Date   WBC 11.6 (H) 07/06/2019   HGB 13.6 07/06/2019    HCT 42.1 07/06/2019   MCV 97.0 07/06/2019   PLT 306 07/06/2019    Attestation Statements:   Reviewed by clinician on day of visit: allergies, medications, problem list, medical history, surgical history, family history, social history, and previous encounter notes.  Time spent on visit including pre-visit chart review and post-visit care and charting was 32 minutes.   Coral Ceo, am acting as Location manager for Mina Marble, NP.  I have reviewed the above documentation for accuracy and completeness, and I agree with the above. -  Julia Kulzer d. Josedaniel Haye, NP-C

## 2021-01-27 ENCOUNTER — Ambulatory Visit (INDEPENDENT_AMBULATORY_CARE_PROVIDER_SITE_OTHER): Payer: Self-pay | Admitting: Family Medicine

## 2021-02-05 ENCOUNTER — Other Ambulatory Visit: Payer: Self-pay

## 2021-02-05 ENCOUNTER — Other Ambulatory Visit (INDEPENDENT_AMBULATORY_CARE_PROVIDER_SITE_OTHER): Payer: Self-pay | Admitting: Family Medicine

## 2021-02-05 ENCOUNTER — Ambulatory Visit (INDEPENDENT_AMBULATORY_CARE_PROVIDER_SITE_OTHER): Payer: 59 | Admitting: Family Medicine

## 2021-02-05 ENCOUNTER — Encounter (INDEPENDENT_AMBULATORY_CARE_PROVIDER_SITE_OTHER): Payer: Self-pay | Admitting: Family Medicine

## 2021-02-05 ENCOUNTER — Encounter (INDEPENDENT_AMBULATORY_CARE_PROVIDER_SITE_OTHER): Payer: Self-pay

## 2021-02-05 VITALS — BP 115/74 | HR 94 | Temp 98.1°F | Ht 65.0 in | Wt 206.0 lb

## 2021-02-05 DIAGNOSIS — Z6834 Body mass index (BMI) 34.0-34.9, adult: Secondary | ICD-10-CM

## 2021-02-05 DIAGNOSIS — F3289 Other specified depressive episodes: Secondary | ICD-10-CM | POA: Diagnosis not present

## 2021-02-05 DIAGNOSIS — E669 Obesity, unspecified: Secondary | ICD-10-CM

## 2021-02-05 DIAGNOSIS — E559 Vitamin D deficiency, unspecified: Secondary | ICD-10-CM | POA: Diagnosis not present

## 2021-02-05 DIAGNOSIS — Z9189 Other specified personal risk factors, not elsewhere classified: Secondary | ICD-10-CM

## 2021-02-05 DIAGNOSIS — R7303 Prediabetes: Secondary | ICD-10-CM

## 2021-02-05 MED ORDER — RYBELSUS 3 MG PO TABS
ORAL_TABLET | ORAL | 0 refills | Status: DC
Start: 2021-02-05 — End: 2021-02-19

## 2021-02-05 MED ORDER — ESCITALOPRAM OXALATE 20 MG PO TABS: 20.0000 mg | ORAL_TABLET | Freq: Every day | ORAL | 0 refills | Status: DC

## 2021-02-05 MED ORDER — VITAMIN D (ERGOCALCIFEROL) 1.25 MG (50000 UNIT) PO CAPS: 50000.0000 [IU] | ORAL_CAPSULE | ORAL | 0 refills | Status: DC

## 2021-02-05 MED FILL — RYBELSUS 3 MG TABS: 3 | 30 days supply | Qty: 30 | Fill #0

## 2021-02-05 MED FILL — ESCITALOPRAM 20 MG TABLET: 20 | 30 days supply | Qty: 30 | Fill #0

## 2021-02-06 ENCOUNTER — Other Ambulatory Visit (INDEPENDENT_AMBULATORY_CARE_PROVIDER_SITE_OTHER): Payer: Self-pay | Admitting: Family Medicine

## 2021-02-06 ENCOUNTER — Encounter (INDEPENDENT_AMBULATORY_CARE_PROVIDER_SITE_OTHER): Payer: Self-pay

## 2021-02-06 DIAGNOSIS — I10 Essential (primary) hypertension: Secondary | ICD-10-CM

## 2021-02-06 NOTE — Telephone Encounter (Signed)
MyChart message sent to pt to find out if they have enough medication to get them through until next appt.   

## 2021-02-10 ENCOUNTER — Telehealth (INDEPENDENT_AMBULATORY_CARE_PROVIDER_SITE_OTHER): Payer: Self-pay | Admitting: Family Medicine

## 2021-02-10 ENCOUNTER — Encounter (INDEPENDENT_AMBULATORY_CARE_PROVIDER_SITE_OTHER): Payer: Self-pay | Admitting: Family Medicine

## 2021-02-10 NOTE — Telephone Encounter (Signed)
Patient need refill on blood pressure medication. Sent Olen Cordial a message in [place] that she will run out before 3/16 appointment.

## 2021-02-10 NOTE — Telephone Encounter (Signed)
See my chart message

## 2021-02-10 NOTE — Telephone Encounter (Signed)
Patient called, she is out of her Blood Pressure meds.  Please advise.

## 2021-02-10 NOTE — Telephone Encounter (Signed)
Refill please.

## 2021-02-11 ENCOUNTER — Other Ambulatory Visit (INDEPENDENT_AMBULATORY_CARE_PROVIDER_SITE_OTHER): Payer: Self-pay

## 2021-02-11 ENCOUNTER — Other Ambulatory Visit (INDEPENDENT_AMBULATORY_CARE_PROVIDER_SITE_OTHER): Payer: Self-pay | Admitting: Family Medicine

## 2021-02-11 DIAGNOSIS — I10 Essential (primary) hypertension: Secondary | ICD-10-CM

## 2021-02-11 MED ORDER — HYDROCHLOROTHIAZIDE 12.5 MG PO TABS: 12.5000 mg | ORAL_TABLET | Freq: Every day | ORAL | 0 refills | Status: DC

## 2021-02-11 MED FILL — HYDROCHLOROTHIAZIDE 12.5 MG: 12.5 | 30 days supply | Qty: 30 | Fill #0

## 2021-02-19 ENCOUNTER — Ambulatory Visit (INDEPENDENT_AMBULATORY_CARE_PROVIDER_SITE_OTHER): Payer: 59 | Admitting: Adult Health

## 2021-02-19 ENCOUNTER — Encounter (INDEPENDENT_AMBULATORY_CARE_PROVIDER_SITE_OTHER): Payer: Self-pay | Admitting: Adult Health

## 2021-02-19 ENCOUNTER — Other Ambulatory Visit: Payer: Self-pay

## 2021-02-19 VITALS — BP 118/79 | HR 102 | Temp 97.9°F | Ht 65.0 in | Wt 205.0 lb

## 2021-02-19 DIAGNOSIS — R7303 Prediabetes: Secondary | ICD-10-CM

## 2021-02-19 DIAGNOSIS — F3289 Other specified depressive episodes: Secondary | ICD-10-CM | POA: Diagnosis not present

## 2021-02-19 DIAGNOSIS — E669 Obesity, unspecified: Secondary | ICD-10-CM | POA: Diagnosis not present

## 2021-02-19 DIAGNOSIS — Z6834 Body mass index (BMI) 34.0-34.9, adult: Secondary | ICD-10-CM

## 2021-02-20 NOTE — Progress Notes (Signed)
Chief Complaint:   OBESITY Yvonne [name] is here to discuss her progress with her obesity treatment plan along with follow-up of her obesity related diagnoses. Yvonne [name] is on following a lower carbohydrate, vegetable and lean protein rich diet plan and states she is following her eating plan approximately 90% of the time. Yvonne [name] states she is walking for 30 minutes 5 times per week.  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  Subjective:   1. Pre-diabetes Yvonne [name] is stable on Rybelsus. She notes decreased polyphagia, and she is working on diet.  2. Vitamin D deficiency Yvonne [name] is stable on Vit D. Her level had dropped but this has improved now.  3. Other depression, with emotional eating Yvonne [name]'s mood is stable on her medications. She is working on [employer] and doing well. She denies worsening insomnia.  4. At risk for diabetes mellitus Yvonne [name] is at higher than average risk for developing diabetes due to obesity.   Assessment/Plan:   1. Pre-diabetes Yvonne [name] will continue to work on weight loss, exercise, and decreasing simple carbohydrates to help decrease the risk of diabetes. We will refill Rybelsus 3 mg q AM #30 with no refills.  2. Vitamin D deficiency Low Vitamin D level contributes to fatigue and are associated with obesity, breast, and colon cancer. We will refill prescription Vitamin D for 1 month. Yvonne [name] will follow-up for routine testing of Vitamin D, at least 2-3 times per year to avoid over-replacement.  - Vitamin D, Ergocalciferol, (DRISDOL) 1.25 MG (50000 UNIT) CAPS capsule; Take 1 capsule (50,000 Units total) by mouth every 7 (seven) days.   Dispense: 4 capsule; Refill: 0  3. Other depression, with emotional eating Behavior modification techniques were discussed today to help Yvonne [name] deal with her emotional/non-hunger eating behaviors. We will refill Lexapro for 1 month. Orders and follow up as documented in patient record.   - escitalopram (LEXAPRO) 20 MG tablet; Take 1 tablet (20 mg total) by mouth daily.  Dispense: 30 tablet; Refill: 0  4. At risk for diabetes mellitus Yvonne [name] was given approximately 15 minutes of diabetes education and counseling today. We discussed intensive lifestyle modifications today with an emphasis on weight loss as well as increasing exercise and decreasing simple carbohydrates in her diet. We also reviewed medication options with an emphasis on risk versus benefit of those discussed.   Repetitive spaced learning was employed today to elicit superior memory formation and behavioral change.  5. Class 1 obesity with serious comorbidity and body mass index (BMI) of 34.0 to 34.9 in adult, unspecified obesity type Yvonne [name] is currently in the action stage of change. As such, her goal is to continue with weight loss efforts. She has agreed to following a lower carbohydrate, vegetable and lean protein rich diet plan.   Exercise goals: As is.  Behavioral modification strategies: increasing lean protein intake and meal planning and cooking strategies.  Yvonne [name] has agreed to follow-up with our clinic in 2 weeks. She was informed of the importance of frequent follow-up visits to maximize her success with intensive lifestyle modifications for her multiple health conditions.   Objective:   Blood pressure 115/74, pulse 94, temperature 98.1 F (36.7 C), height  5\' 5"  (1.651 m), weight 206 lb (93.4 kg), SpO2 98 %. Body mass index is 34.28 kg/m.  General: Cooperative, alert, well developed, in no acute distress. HEENT: Conjunctivae and lids unremarkable. Cardiovascular: Regular rhythm.  Lungs: Normal work of  breathing. Neurologic: No focal deficits.   Lab Results  Component Value Date   CREATININE 0.84 01/08/2021   BUN 12 01/08/2021   NA 141 01/08/2021   K 4.7 01/08/2021   CL 103 01/08/2021   CO2 21 01/08/2021   Lab Results  Component Value Date   ALT 16 01/08/2021   AST 15 01/08/2021   ALKPHOS 90 01/08/2021   BILITOT <0.2 01/08/2021   Lab Results  Component Value Date   HGBA1C 6.1 (H) 01/08/2021   HGBA1C 5.9 (H) 11/09/2019   HGBA1C 5.9 (H) 12/26/2018   HGBA1C 5.6 02/15/2018   HGBA1C 5.7 (H) 09/13/2017   Lab Results  Component Value Date   INSULIN 37.3 (H) 01/08/2021   INSULIN 33.2 (H) 11/09/2019   INSULIN 9.3 12/26/2018   INSULIN 28.8 (H) 02/15/2018   INSULIN 35.7 (H) 09/13/2017   Lab Results  Component Value Date   TSH 0.77 04/28/2019   Lab Results  Component Value Date   CHOL 190 01/08/2021   HDL 60 01/08/2021   LDLCALC 112 (H) 01/08/2021   TRIG 100 01/08/2021   CHOLHDL 3.4 04/28/2019   Lab Results  Component Value Date   WBC 11.6 (H) 07/06/2019   HGB 13.6 07/06/2019   HCT 42.1 07/06/2019   MCV 97.0 07/06/2019   PLT 306 07/06/2019   No results found for: IRON, TIBC, FERRITIN  Attestation Statements:   Reviewed by clinician on day of visit: allergies, medications, problem list, medical history, surgical history, family history, social history, and previous encounter notes.   I, Trixie Dredge, am acting as transcriptionist for Dennard Nip, MD.  I have reviewed the above documentation for accuracy and completeness, and I agree with the above. -  Dennard Nip, MD

## 2021-02-20 NOTE — Progress Notes (Signed)
Chief Complaint:   OBESITY Yvonne [name] is here to discuss her progress with her obesity treatment plan along with follow-up of her obesity related diagnoses. Yvonne [name] is on following a lower carbohydrate, vegetable and lean protein rich diet plan and states she is following her eating plan approximately 80% of the time. Yvonne [name] states she is walking 30 minutes 5 times per week.  Today's visit was #: 65 Starting weight: 204 lbs Starting date: 05/17/2017 Today's weight: 205 lbs Today's date: 02/19/2021 Total lbs lost to date: 0 Total lbs lost since last in-office visit: 1 lb  Interim History: Yvonne [name] was started on Rybelsus 08/2019. She was on it for 2 months then discontinued due to a move to [name].  She was re-started on Rybelsus 01/2021. She was on Rybelsus 3 mg for about 6 weeks, and over the last week, she experienced abdominal pain. She stopped Rybelsus >5 days and abdominal pain stopped. She was previously on Saxenda and discontinued due to abdominal pain. She is not following journaling.    Subjective:   1. Pre-diabetes Yvonne [name] is unable to tolerate oral or injectable GLP- Saxenda and Rybelsus- both caused abdominal pain. She has been off Rybelsus 3 mg for >5 days. She is not interested in restarting prescription.  Lab Results  Component Value Date   HGBA1C 6.1 (H) 01/08/2021   Lab Results  Component Value Date   INSULIN 37.3 (H) 01/08/2021   INSULIN 33.2 (H) 11/09/2019   INSULIN 9.3 12/26/2018   INSULIN 28.8 (H) 02/15/2018   INSULIN 35.7 (H) 09/13/2017    2. Other depression, with emotional eating Yvonne [name] reports stable mood and denies suicidal or homicidal ideations. She is on Lexapro 20 mg QD. She states, "I'm in a good space."  Assessment/Plan:   1. Pre-diabetes Yvonne [name] will continue to work on weight loss, exercise, and decreasing simple carbohydrates to help decrease the risk of diabetes. Increase protein.  2. Other depression, with emotional eating Behavior  modification techniques were discussed today to help Yvonne [name] deal with her emotional/non-hunger eating behaviors.  Orders and follow up as documented in patient record. Continue Lexapro.  3. Class 1 obesity without serious comorbidity with body mass index (BMI) of 34.0 to 34.9 in adult, unspecified obesity type Yvonne [name] is currently in the action stage of change. As such, her goal is to continue with weight loss efforts. She has agreed to following a lower carbohydrate, vegetable and lean protein rich diet plan.   Exercise goals: As is  Behavioral modification strategies: increasing lean protein intake, decreasing simple carbohydrates, no skipping meals, meal planning and cooking strategies and planning for success.  Yvonne [name] has agreed to follow-up with our clinic in 4 weeks. She was informed of the importance of frequent follow-up visits to maximize her success with intensive lifestyle modifications for her multiple health conditions.   Objective:   Blood pressure 118/79, pulse (!) 102, temperature 97.9 F (36.6 C), height 5\' 5"  (1.651 m), weight 205 lb (93 kg), SpO2 98 %. Body mass index is 34.11 kg/m.  General: Cooperative, alert, well developed, in no acute distress. HEENT: Conjunctivae and lids unremarkable. Cardiovascular: Regular rhythm.  Lungs: Normal work of breathing. Neurologic: No focal deficits.   Lab Results  Component Value Date   CREATININE 0.84 01/08/2021   BUN 12 01/08/2021   NA 141 01/08/2021   K 4.7 01/08/2021   CL 103 01/08/2021   CO2 21 01/08/2021   Lab Results  Component Value Date   ALT 16 01/08/2021  AST 15 01/08/2021   ALKPHOS 90 01/08/2021   BILITOT <0.2 01/08/2021   Lab Results  Component Value Date   HGBA1C 6.1 (H) 01/08/2021   HGBA1C 5.9 (H) 11/09/2019   HGBA1C 5.9 (H) 12/26/2018   HGBA1C 5.6 02/15/2018   HGBA1C 5.7 (H) 09/13/2017   Lab Results  Component Value Date   INSULIN 37.3 (H) 01/08/2021   INSULIN 33.2 (H) 11/09/2019   INSULIN 9.3  12/26/2018   INSULIN 28.8 (H) 02/15/2018   INSULIN 35.7 (H) 09/13/2017   Lab Results  Component Value Date   TSH 0.77 04/28/2019   Lab Results  Component Value Date   CHOL 190 01/08/2021   HDL 60 01/08/2021   LDLCALC 112 (H) 01/08/2021   TRIG 100 01/08/2021   CHOLHDL 3.4 04/28/2019   Lab Results  Component Value Date   WBC 11.6 (H) 07/06/2019   HGB 13.6 07/06/2019   HCT 42.1 07/06/2019   MCV 97.0 07/06/2019   PLT 306 07/06/2019   No results found for: IRON, TIBC, FERRITIN  Attestation Statements:   Reviewed by clinician on day of visit: allergies, medications, problem list, medical history, surgical history, family history, social history, and previous encounter notes.  Time spent on visit including pre-visit chart review and post-visit care and charting was 33 minutes.   Coral Ceo, am acting as Location manager for Mina Marble, NP.  I have reviewed the above documentation for accuracy and completeness, and I agree with the above. -  Jennine Peddy d. Midas Daughety, NP-C

## 2021-03-11 ENCOUNTER — Ambulatory Visit (INDEPENDENT_AMBULATORY_CARE_PROVIDER_SITE_OTHER): Payer: 59 | Admitting: Family Medicine

## 2021-03-14 ENCOUNTER — Other Ambulatory Visit (HOSPITAL_BASED_OUTPATIENT_CLINIC_OR_DEPARTMENT_OTHER): Payer: Self-pay

## 2021-03-17 ENCOUNTER — Other Ambulatory Visit (HOSPITAL_COMMUNITY): Payer: Self-pay

## 2021-03-17 ENCOUNTER — Ambulatory Visit (INDEPENDENT_AMBULATORY_CARE_PROVIDER_SITE_OTHER): Payer: 59 | Admitting: Family Medicine

## 2021-03-17 ENCOUNTER — Other Ambulatory Visit: Payer: Self-pay

## 2021-03-17 ENCOUNTER — Encounter (INDEPENDENT_AMBULATORY_CARE_PROVIDER_SITE_OTHER): Payer: Self-pay | Admitting: Family Medicine

## 2021-03-17 VITALS — BP 117/72 | HR 85 | Temp 98.3°F | Ht 65.0 in | Wt 208.0 lb

## 2021-03-17 DIAGNOSIS — Z9189 Other specified personal risk factors, not elsewhere classified: Secondary | ICD-10-CM

## 2021-03-17 DIAGNOSIS — F3289 Other specified depressive episodes: Secondary | ICD-10-CM

## 2021-03-17 DIAGNOSIS — I10 Essential (primary) hypertension: Secondary | ICD-10-CM | POA: Diagnosis not present

## 2021-03-17 DIAGNOSIS — Z6833 Body mass index (BMI) 33.0-33.9, adult: Secondary | ICD-10-CM

## 2021-03-17 DIAGNOSIS — R7303 Prediabetes: Secondary | ICD-10-CM

## 2021-03-17 DIAGNOSIS — E669 Obesity, unspecified: Secondary | ICD-10-CM

## 2021-03-17 DIAGNOSIS — E559 Vitamin D deficiency, unspecified: Secondary | ICD-10-CM

## 2021-03-17 MED ORDER — HYDROCHLOROTHIAZIDE 12.5 MG PO TABS
ORAL_TABLET | Freq: Every day | ORAL | 0 refills | Status: DC
  Filled 2021-03-17: qty 30, 30d supply, fill #0

## 2021-03-17 MED ORDER — VITAMIN D (ERGOCALCIFEROL) 1.25 MG (50000 UNIT) PO CAPS
ORAL_CAPSULE | ORAL | 0 refills | Status: DC
  Filled 2021-03-17: qty 4, 28d supply, fill #0

## 2021-03-17 MED ORDER — ESCITALOPRAM OXALATE 20 MG PO TABS
ORAL_TABLET | Freq: Every day | ORAL | 0 refills | Status: DC
  Filled 2021-03-17: qty 30, 30d supply, fill #0

## 2021-03-18 ENCOUNTER — Other Ambulatory Visit (HOSPITAL_COMMUNITY): Payer: Self-pay

## 2021-03-24 DIAGNOSIS — H524 Presbyopia: Secondary | ICD-10-CM | POA: Diagnosis not present

## 2021-03-25 NOTE — Progress Notes (Signed)
Chief Complaint:   OBESITY Yvonne [name] is here to discuss her progress with her obesity treatment plan along with follow-up of her obesity related diagnoses. Yvonne [name] is on following a lower carbohydrate, vegetable and lean protein rich diet plan and states she is following her eating plan approximately 50% of the time. Yvonne [name] states she is walking for 30 minutes 5 times per week.  Today's visit was #: 63 Starting weight: 204 lbs Starting date: 05/17/2017 Today's weight: 208 lbs Today's date: 03/17/2021 Total lbs lost to date: 0 Total lbs lost since last in-office visit: 0  Interim History: Yvonne [name] has been struggling to follow her low carbohydrate plan closely. She notes increased work stress with working longer hours and decreased ability to meal plan and prep.  Subjective:   1. Pre-diabetes Yvonne [name] has GI upset with Rybelsus and had stopped taking it and her abdominal pain is gone. She still notes some polyphagia. Her abdominal pain did not appear to be from pancreatitis.  2. Essential hypertension Catharine's blood pressure is stable on her medications, and she denies signs of hypotension.  3. Vitamin D deficiency Yvonne [name] is stable on Vit D, and she requests a refill today.  4. Other depression, with emotional eating Yvonne [name] is struggling with longer work hours which makes meal planning difficult. She feels the Lexapro is helping her mood and helping decrease some of her emotional eating behaviors.  5. At risk for heart disease Nettie is at a higher than average risk for cardiovascular disease due to obesity.   Assessment/Plan:   1. Pre-diabetes Vyctoria is ok to try to take Rybelsus every other day and make sure to eat approximately 30 minutes after taking it. No refill is needed today. She will continue to work on weight loss, exercise, and decreasing simple carbohydrates to help decrease the risk of diabetes.   2. Essential hypertension Yvonne [name] is working on healthy weight loss and  exercise to improve blood pressure control. We will watch for signs of hypotension as she continues her lifestyle modifications. We will refill hydrochlorothiazide for 1 month.  - hydrochlorothiazide (HYDRODIURIL) 12.5 MG tablet; TAKE 1 TABLET BY MOUTH ONCE A DAY  Dispense: 30 tablet; Refill: 0  3. Vitamin D deficiency Low Vitamin D level contributes to fatigue and are associated with obesity, breast, and colon cancer. We will refill prescription Vitamin D for 1 month. Yvonne [name] will follow-up for routine testing of Vitamin D, at least 2-3 times per year to avoid over-replacement.  - Vitamin D, Ergocalciferol, (DRISDOL) 1.25 MG (50000 UNIT) CAPS capsule; TAKE 1 CAPSULE BY MOUTH EVERY 7 DAYS.  Dispense: 4 capsule; Refill: 0  4. Other depression, with emotional eating Behavior modification techniques were discussed today to help [facility] deal with her emotional/non-hunger eating behaviors. We will refill Lexapro for 1 month. Orders and follow up as documented in patient record.   - escitalopram (LEXAPRO) 20 MG tablet; TAKE 1 TABLET BY MOUTH DAILY.  Dispense: 30 tablet; Refill: 0  5. At risk for heart disease Yvonne [name] was given approximately 15 minutes of coronary artery disease prevention counseling today. She is 50 y.o. female and has risk factors for heart disease including obesity. We discussed intensive lifestyle modifications today with an emphasis on specific weight loss instructions and strategies.   Repetitive spaced learning was employed today to elicit superior memory formation and behavioral change.  6. Obesity with currnt BMI of 34.7 Yvonne [name] is currently in the action stage of change. As such, her goal is to continue with  weight loss efforts. She has agreed to change to the Category 2 Plan.   Exercise goals: As is.  Behavioral modification strategies: increasing lean protein intake, meal planning and cooking strategies and emotional eating strategies.  Yvonne [name] has agreed to follow-up with  our clinic in 3 to 4 weeks. She was informed of the importance of frequent follow-up visits to maximize her success with intensive lifestyle modifications for her multiple health conditions.   Objective:   Blood pressure 117/72, pulse 85, temperature 98.3 F (36.8 C), height 5\' 5"  (1.651 m), weight 208 lb (94.3 kg), SpO2 99 %. Body mass index is 34.61 kg/m.  General: Cooperative, alert, well developed, in no acute distress. HEENT: Conjunctivae and lids unremarkable. Cardiovascular: Regular rhythm.  Lungs: Normal work of breathing. Neurologic: No focal deficits.   Lab Results  Component Value Date   CREATININE 0.84 01/08/2021   BUN 12 01/08/2021   NA 141 01/08/2021   K 4.7 01/08/2021   CL 103 01/08/2021   CO2 21 01/08/2021   Lab Results  Component Value Date   ALT 16 01/08/2021   AST 15 01/08/2021   ALKPHOS 90 01/08/2021   BILITOT <0.2 01/08/2021   Lab Results  Component Value Date   HGBA1C 6.1 (H) 01/08/2021   HGBA1C 5.9 (H) 11/09/2019   HGBA1C 5.9 (H) 12/26/2018   HGBA1C 5.6 02/15/2018   HGBA1C 5.7 (H) 09/13/2017   Lab Results  Component Value Date   INSULIN 37.3 (H) 01/08/2021   INSULIN 33.2 (H) 11/09/2019   INSULIN 9.3 12/26/2018   INSULIN 28.8 (H) 02/15/2018   INSULIN 35.7 (H) 09/13/2017   Lab Results  Component Value Date   TSH 0.77 04/28/2019   Lab Results  Component Value Date   CHOL 190 01/08/2021   HDL 60 01/08/2021   LDLCALC 112 (H) 01/08/2021   TRIG 100 01/08/2021   CHOLHDL 3.4 04/28/2019   Lab Results  Component Value Date   WBC 11.6 (H) 07/06/2019   HGB 13.6 07/06/2019   HCT 42.1 07/06/2019   MCV 97.0 07/06/2019   PLT 306 07/06/2019   No results found for: IRON, TIBC, FERRITIN  Attestation Statements:   Reviewed by clinician on day of visit: allergies, medications, problem list, medical history, surgical history, family history, social history, and previous encounter notes.   I, Trixie Dredge, am acting as transcriptionist for  Dennard Nip, MD.  I have reviewed the above documentation for accuracy and completeness, and I agree with the above. -  Dennard Nip, MD

## 2021-03-31 ENCOUNTER — Ambulatory Visit (INDEPENDENT_AMBULATORY_CARE_PROVIDER_SITE_OTHER): Payer: 59 | Admitting: Family Medicine

## 2021-03-31 ENCOUNTER — Other Ambulatory Visit: Payer: Self-pay

## 2021-03-31 ENCOUNTER — Encounter (INDEPENDENT_AMBULATORY_CARE_PROVIDER_SITE_OTHER): Payer: Self-pay | Admitting: Family Medicine

## 2021-03-31 VITALS — BP 135/88 | HR 80 | Temp 97.8°F | Ht 65.0 in | Wt 210.0 lb

## 2021-03-31 DIAGNOSIS — R7303 Prediabetes: Secondary | ICD-10-CM

## 2021-03-31 DIAGNOSIS — Z9189 Other specified personal risk factors, not elsewhere classified: Secondary | ICD-10-CM | POA: Diagnosis not present

## 2021-03-31 DIAGNOSIS — E669 Obesity, unspecified: Secondary | ICD-10-CM

## 2021-03-31 DIAGNOSIS — E66811 Obesity, class 1: Secondary | ICD-10-CM

## 2021-03-31 DIAGNOSIS — Z6834 Body mass index (BMI) 34.0-34.9, adult: Secondary | ICD-10-CM

## 2021-04-01 NOTE — Progress Notes (Signed)
Chief Complaint:   OBESITY Yvonne [name] is here to discuss her progress with her obesity treatment plan along with follow-up of her obesity related diagnoses. Yvonne [name] is on the Category 2 Plan and states she is following her eating plan approximately 50% of the time. Yvonne [name] states she is walking for 30-45 minutes 5 times per week.  Today's visit was #: 47 Starting weight: 204 lbs Starting date: 05/17/2017 Today's weight: 210 lbs Today's date: 03/31/2021 Total lbs lost to date: 0 Total lbs lost since last in-office visit: 0  Interim History: Yvonne [name] is already working on following her Category 2 plan. Things have been stressful for her with changing jobs and she is still getting organized but her weight gain appears to be some water weight. She is walking more at home, but this is likely to decrease in the near future.  Subjective:   1. Pre-diabetes Yvonne [name] is stable on Rybelsus. She feels it has helped to decrease her polyphagia. She denies nausea or vomiting.  2. At risk for activity intolerance Yvonne [name] is at risk of exercise intolerance due to likely decreased activity at work.  Assessment/Plan:   1. Pre-diabetes Yvonne [name] will continue Rybelsus at 3 mg q daily, and will continue to work on weight loss, diet exercise, and decreasing simple carbohydrates to help decrease the risk of diabetes. Will continue to monitor closely  2. At risk for activity intolerance Yvonne [name] was given approximately 15 minutes of exercise intolerance counseling today. She is 50 y.o. female and has risk factors exercise intolerance including obesity. We discussed intensive lifestyle modifications today with an emphasis on specific weight loss instructions and strategies. Yvonne [name] will slowly increase activity as tolerated.  Repetitive spaced learning was employed today to elicit superior memory formation and behavioral change.  3. Class 1 obesity without serious comorbidity with body mass index (BMI) of 34.0 to 34.9  in adult, unspecified obesity type Yvonne [name] is currently in the action stage of change. As such, her goal is to continue with weight loss efforts. She has agreed to the Category 2 Plan.   Exercise goals: All adults should avoid inactivity. Some physical activity is better than none, and adults who participate in any amount of physical activity gain some health benefits.  Behavioral modification strategies: increasing lean protein intake.  Yvonne [name] has agreed to follow-up with our clinic in 3 weeks. She was informed of the importance of frequent follow-up visits to maximize her success with intensive lifestyle modifications for her multiple health conditions.   Objective:   Blood pressure 135/88, pulse 80, temperature 97.8 F (36.6 C), height 5\' 5"  (1.651 m), weight 210 lb (95.3 kg), SpO2 97 %. Body mass index is 34.95 kg/m.  General: Cooperative, alert, well developed, in no acute distress. HEENT: Conjunctivae and lids unremarkable. Cardiovascular: Regular rhythm.  Lungs: Normal work of breathing. Neurologic: No focal deficits.   Lab Results  Component Value Date   CREATININE 0.84 01/08/2021   BUN 12 01/08/2021   NA 141 01/08/2021   K 4.7 01/08/2021   CL 103 01/08/2021   CO2 21 01/08/2021   Lab Results  Component Value Date   ALT 16 01/08/2021   AST 15 01/08/2021   ALKPHOS 90 01/08/2021   BILITOT <0.2 01/08/2021   Lab Results  Component Value Date   HGBA1C 6.1 (H) 01/08/2021   HGBA1C 5.9 (H) 11/09/2019   HGBA1C 5.9 (H) 12/26/2018   HGBA1C 5.6 02/15/2018   HGBA1C 5.7 (H) 09/13/2017   Lab Results  Component Value  Date   INSULIN 37.3 (H) 01/08/2021   INSULIN 33.2 (H) 11/09/2019   INSULIN 9.3 12/26/2018   INSULIN 28.8 (H) 02/15/2018   INSULIN 35.7 (H) 09/13/2017   Lab Results  Component Value Date   TSH 0.77 04/28/2019   Lab Results  Component Value Date   CHOL 190 01/08/2021   HDL 60 01/08/2021   LDLCALC 112 (H) 01/08/2021   TRIG 100 01/08/2021   CHOLHDL 3.4  04/28/2019   Lab Results  Component Value Date   WBC 11.6 (H) 07/06/2019   HGB 13.6 07/06/2019   HCT 42.1 07/06/2019   MCV 97.0 07/06/2019   PLT 306 07/06/2019   No results found for: IRON, TIBC, FERRITIN  Attestation Statements:   Reviewed by clinician on day of visit: allergies, medications, problem list, medical history, surgical history, family history, social history, and previous encounter notes.   I, Trixie Dredge, am acting as transcriptionist for Dennard Nip, MD.  I have reviewed the above documentation for accuracy and completeness, and I agree with the above. -  Dennard Nip, MD

## 2021-04-10 ENCOUNTER — Other Ambulatory Visit (HOSPITAL_BASED_OUTPATIENT_CLINIC_OR_DEPARTMENT_OTHER): Payer: Self-pay

## 2021-04-10 MED FILL — Norethindrone Ace & Ethinyl Estradiol Tab 1 MG-20 MCG: ORAL | 84 days supply | Qty: 84 | Fill #0 | Status: AC

## 2021-04-11 ENCOUNTER — Other Ambulatory Visit (HOSPITAL_BASED_OUTPATIENT_CLINIC_OR_DEPARTMENT_OTHER): Payer: Self-pay

## 2021-04-21 ENCOUNTER — Encounter (INDEPENDENT_AMBULATORY_CARE_PROVIDER_SITE_OTHER): Payer: Self-pay | Admitting: Family Medicine

## 2021-04-21 ENCOUNTER — Other Ambulatory Visit: Payer: Self-pay

## 2021-04-21 ENCOUNTER — Other Ambulatory Visit (HOSPITAL_COMMUNITY): Payer: Self-pay

## 2021-04-21 ENCOUNTER — Ambulatory Visit (INDEPENDENT_AMBULATORY_CARE_PROVIDER_SITE_OTHER): Payer: 59 | Admitting: Family Medicine

## 2021-04-21 VITALS — BP 121/81 | HR 84 | Temp 98.1°F | Ht 65.0 in | Wt 210.0 lb

## 2021-04-21 DIAGNOSIS — E559 Vitamin D deficiency, unspecified: Secondary | ICD-10-CM

## 2021-04-21 DIAGNOSIS — F3289 Other specified depressive episodes: Secondary | ICD-10-CM

## 2021-04-21 DIAGNOSIS — I10 Essential (primary) hypertension: Secondary | ICD-10-CM | POA: Diagnosis not present

## 2021-04-21 DIAGNOSIS — R7303 Prediabetes: Secondary | ICD-10-CM | POA: Diagnosis not present

## 2021-04-21 DIAGNOSIS — Z6833 Body mass index (BMI) 33.0-33.9, adult: Secondary | ICD-10-CM

## 2021-04-21 DIAGNOSIS — E669 Obesity, unspecified: Secondary | ICD-10-CM | POA: Diagnosis not present

## 2021-04-21 DIAGNOSIS — Z9189 Other specified personal risk factors, not elsewhere classified: Secondary | ICD-10-CM | POA: Diagnosis not present

## 2021-04-21 MED ORDER — CONTRAVE 8-90 MG PO TB12
ORAL_TABLET | ORAL | 0 refills | Status: DC
  Filled 2021-04-21 – 2021-04-29 (×2): qty 120, 30d supply, fill #0

## 2021-04-21 MED ORDER — ESCITALOPRAM OXALATE 20 MG PO TABS
ORAL_TABLET | Freq: Every day | ORAL | 0 refills | Status: DC
  Filled 2021-04-21: qty 30, 30d supply, fill #0

## 2021-04-21 MED ORDER — HYDROCHLOROTHIAZIDE 12.5 MG PO TABS
ORAL_TABLET | Freq: Every day | ORAL | 0 refills | Status: DC
  Filled 2021-04-21: qty 30, 30d supply, fill #0

## 2021-04-21 MED ORDER — VITAMIN D (ERGOCALCIFEROL) 1.25 MG (50000 UNIT) PO CAPS
ORAL_CAPSULE | ORAL | 0 refills | Status: DC
  Filled 2021-04-21: qty 4, 28d supply, fill #0

## 2021-04-21 NOTE — Progress Notes (Signed)
Chief Complaint:   OBESITY Yvonne [name] is here to discuss her progress with her obesity treatment plan along with follow-up of her obesity related diagnoses. Yvonne [name] is on the Category 2 Plan and states she is following her eating plan approximately 50% of the time. Yvonne [name] states she is walking for 30 minutes 3 times per week.  Today's visit was #: 71 Starting weight: 204 lbs Starting date: 05/17/2017 Today's weight: 210 lbs Today's date: 04/21/2021 Total lbs lost to date: 0 Total lbs lost since last in-office visit: 0  Interim History: Yvonne [name] has done well maintaining her weight. She is changing jobs next week and there has been some increased stress, but she has done well avoiding weight gain. She still struggles with cravings and would like to look at medications which could help.  Subjective:   1. Pre-diabetes Lilah had abdominal pain with Rybelsus, even with the 3 mg dose every other day so she stopped. She also has abdominal pain with Saxenda. She has questions about Mancel Parsons but there is a medication shortage.  2. Vitamin D deficiency Yvonne [name] is stable on Vit D, and she requests a refill today.  3. Essential hypertension Yvonne [name]'s blood pressure is stable on her medications. She requests a refill. She continues to work on diet, exercise, and weight loss.  4. Other depression, with emotional eating Yvonne [name] is struggling with increased emotional eating behaviors, and she will be starting Contrave for weight loss. She is changing jobs which will hopefully decrease her stress.  5. At risk for nausea Yvonne [name] is at risk for nausea due to Contrave.  Assessment/Plan:   1. Pre-diabetes Yvonne [name] agreed to discontinue Rybelsus, and will continue to work on diet and weight loss to help decrease the risk of diabetes. She declined metformin.  2. Vitamin D deficiency Low Vitamin D level contributes to fatigue and are associated with obesity, breast, and colon cancer. We will refill  prescription Vitamin D for 1 month. Yvonne [name] will follow-up for routine testing of Vitamin D, at least 2-3 times per year to avoid over-replacement.  - Vitamin D, Ergocalciferol, (DRISDOL) 1.25 MG (50000 UNIT) CAPS capsule; TAKE 1 CAPSULE BY MOUTH EVERY 7 DAYS.  Dispense: 4 capsule; Refill: 0  3. Essential hypertension Yvonne [name] is working on healthy weight loss and exercise to improve blood pressure control. We will watch for signs of hypotension as she continues her lifestyle modifications. We will refill hydrochlorothiazide for 1 month.  - hydrochlorothiazide (HYDRODIURIL) 12.5 MG tablet; TAKE 1 TABLET BY MOUTH ONCE A DAY  Dispense: 30 tablet; Refill: 0  4. Other depression, with emotional eating Behavior modification techniques were discussed today to help Yvonne [name] deal with her emotional/non-hunger eating behaviors. We will refill Lexapro for 1 month. Orders and follow up as documented in patient record.   - escitalopram (LEXAPRO) 20 MG tablet; TAKE 1 TABLET BY MOUTH DAILY.  Dispense: 30 tablet; Refill: 0  5. At risk for nausea Yvonne [name] was given approximately 15 minutes of nausea prevention counseling today. Yvonne [name] is at risk for nausea due to her new or current medication. She was encouraged to titrate her medication slowly, make sure to stay hydrated, eat smaller portions throughout the day, and avoid high fat meals.   6. Obesity with current BMI 35.0 Yvonne [name] is currently in the action stage of change. As such, her goal is to continue with weight loss efforts. She has agreed to the Category 3 Plan.   We discussed various medication options to help Yvonne [name] with  her weight loss efforts and we both agreed to start Contrave 8-90 mg 2 tablets PO BID with no refills (she is to increase her dose slowly, 1 tablet PO a daily for 2 weeks).  - Naltrexone-buPROPion HCl ER (CONTRAVE) 8-90 MG TB12; Take 1 tablet by mouth twice daily and increase up to 2 tablets twice daily as directed.  Dispense: 120  tablet; Refill: 0  Exercise goals: As is.  Behavioral modification strategies: increasing lean protein intake and no skipping meals.  Yvonne [name] has agreed to follow-up with our clinic in 2 to 3 weeks. She was informed of the importance of frequent follow-up visits to maximize her success with intensive lifestyle modifications for her multiple health conditions.   Objective:   Blood pressure 121/81, pulse 84, temperature 98.1 F (36.7 C), height 5\' 5"  (1.651 m), weight 210 lb (95.3 kg), SpO2 98 %. Body mass index is 34.95 kg/m.  General: Cooperative, alert, well developed, in no acute distress. HEENT: Conjunctivae and lids unremarkable. Cardiovascular: Regular rhythm.  Lungs: Normal work of breathing. Neurologic: No focal deficits.   Lab Results  Component Value Date   CREATININE 0.84 01/08/2021   BUN 12 01/08/2021   NA 141 01/08/2021   K 4.7 01/08/2021   CL 103 01/08/2021   CO2 21 01/08/2021   Lab Results  Component Value Date   ALT 16 01/08/2021   AST 15 01/08/2021   ALKPHOS 90 01/08/2021   BILITOT <0.2 01/08/2021   Lab Results  Component Value Date   HGBA1C 6.1 (H) 01/08/2021   HGBA1C 5.9 (H) 11/09/2019   HGBA1C 5.9 (H) 12/26/2018   HGBA1C 5.6 02/15/2018   HGBA1C 5.7 (H) 09/13/2017   Lab Results  Component Value Date   INSULIN 37.3 (H) 01/08/2021   INSULIN 33.2 (H) 11/09/2019   INSULIN 9.3 12/26/2018   INSULIN 28.8 (H) 02/15/2018   INSULIN 35.7 (H) 09/13/2017   Lab Results  Component Value Date   TSH 0.77 04/28/2019   Lab Results  Component Value Date   CHOL 190 01/08/2021   HDL 60 01/08/2021   LDLCALC 112 (H) 01/08/2021   TRIG 100 01/08/2021   CHOLHDL 3.4 04/28/2019   Lab Results  Component Value Date   WBC 11.6 (H) 07/06/2019   HGB 13.6 07/06/2019   HCT 42.1 07/06/2019   MCV 97.0 07/06/2019   PLT 306 07/06/2019   No results found for: IRON, TIBC, FERRITIN  Attestation Statements:   Reviewed by clinician on day of visit: allergies,  medications, problem list, medical history, surgical history, family history, social history, and previous encounter notes.   I, Trixie Dredge, am acting as transcriptionist for Dennard Nip, MD.  I have reviewed the above documentation for accuracy and completeness, and I agree with the above. -  Dennard Nip, MD

## 2021-04-23 ENCOUNTER — Telehealth (INDEPENDENT_AMBULATORY_CARE_PROVIDER_SITE_OTHER): Payer: Self-pay | Admitting: Emergency Medicine

## 2021-04-23 NOTE — Telephone Encounter (Signed)
Prior Auth : Initiated: Contrave   Key: [id number] - PA Case ID: [id number]

## 2021-04-26 ENCOUNTER — Other Ambulatory Visit (HOSPITAL_COMMUNITY): Payer: Self-pay

## 2021-04-29 ENCOUNTER — Other Ambulatory Visit (HOSPITAL_COMMUNITY): Payer: Self-pay

## 2021-04-30 ENCOUNTER — Other Ambulatory Visit (HOSPITAL_COMMUNITY): Payer: Self-pay

## 2021-05-01 ENCOUNTER — Telehealth (INDEPENDENT_AMBULATORY_CARE_PROVIDER_SITE_OTHER): Payer: Self-pay | Admitting: Emergency Medicine

## 2021-05-01 ENCOUNTER — Other Ambulatory Visit (HOSPITAL_COMMUNITY): Payer: Self-pay

## 2021-05-01 NOTE — Telephone Encounter (Signed)
Kailin Broadus   Prior auth:   (Key: [id number]) - [id number] Contrave 8-90MG  er tablets     Status: PA Response - Approved

## 2021-05-15 ENCOUNTER — Other Ambulatory Visit (HOSPITAL_COMMUNITY): Payer: Self-pay

## 2021-05-15 ENCOUNTER — Encounter (INDEPENDENT_AMBULATORY_CARE_PROVIDER_SITE_OTHER): Payer: Self-pay | Admitting: Family Medicine

## 2021-05-15 ENCOUNTER — Other Ambulatory Visit: Payer: Self-pay

## 2021-05-15 ENCOUNTER — Ambulatory Visit (INDEPENDENT_AMBULATORY_CARE_PROVIDER_SITE_OTHER): Payer: 59 | Admitting: Family Medicine

## 2021-05-15 VITALS — BP 131/82 | HR 69 | Temp 98.0°F | Ht 65.0 in | Wt 211.0 lb

## 2021-05-15 DIAGNOSIS — E559 Vitamin D deficiency, unspecified: Secondary | ICD-10-CM | POA: Diagnosis not present

## 2021-05-15 DIAGNOSIS — E669 Obesity, unspecified: Secondary | ICD-10-CM | POA: Diagnosis not present

## 2021-05-15 DIAGNOSIS — Z9189 Other specified personal risk factors, not elsewhere classified: Secondary | ICD-10-CM | POA: Diagnosis not present

## 2021-05-15 DIAGNOSIS — R7303 Prediabetes: Secondary | ICD-10-CM

## 2021-05-15 DIAGNOSIS — F3289 Other specified depressive episodes: Secondary | ICD-10-CM | POA: Diagnosis not present

## 2021-05-15 DIAGNOSIS — I10 Essential (primary) hypertension: Secondary | ICD-10-CM | POA: Diagnosis not present

## 2021-05-15 DIAGNOSIS — Z6833 Body mass index (BMI) 33.0-33.9, adult: Secondary | ICD-10-CM

## 2021-05-15 MED ORDER — VITAMIN D (ERGOCALCIFEROL) 1.25 MG (50000 UNIT) PO CAPS
ORAL_CAPSULE | ORAL | 0 refills | Status: DC
  Filled 2021-05-15: qty 4, 28d supply, fill #0

## 2021-05-15 MED ORDER — ESCITALOPRAM OXALATE 20 MG PO TABS
ORAL_TABLET | Freq: Every day | ORAL | 0 refills | Status: DC
Start: 2021-05-15 — End: 2021-06-10
  Filled 2021-05-15: qty 30, 30d supply, fill #0

## 2021-05-15 MED ORDER — HYDROCHLOROTHIAZIDE 12.5 MG PO TABS
ORAL_TABLET | Freq: Every day | ORAL | 0 refills | Status: DC
  Filled 2021-05-15: qty 30, 30d supply, fill #0

## 2021-05-15 MED ORDER — OZEMPIC (0.25 OR 0.5 MG/DOSE) 2 MG/1.5ML ~~LOC~~ SOPN
0.5000 mg | PEN_INJECTOR | SUBCUTANEOUS | 0 refills | Status: DC
  Filled 2021-05-15: qty 1.5, 28d supply, fill #0

## 2021-05-16 ENCOUNTER — Other Ambulatory Visit (HOSPITAL_COMMUNITY): Payer: Self-pay

## 2021-05-22 NOTE — Progress Notes (Signed)
Chief Complaint:   OBESITY Yvonne [name] is here to discuss her progress with her obesity treatment plan along with follow-up of her obesity related diagnoses. Yvonne [name] is on the Category 3 Plan and states she is following her eating plan approximately 55% of the time. Yvonne [name] states she is walking for 15-20 minutes 7 times per week.  Today's visit was #: 0 Starting weight: 204 lbs Starting date: 05/17/2017 Today's weight: 211 lbs Today's date: 05/15/2021 Total lbs lost to date: 0 Total lbs lost since last in-office visit: 0  Interim History: Yvonne [name] continues to work on diet and weight loss. She is in a new job which starts earlier and she struggles to eat breakfast. She feels changing to a low carbohydrate plan will fit her better for now.  Subjective:   1. Pre-diabetes Yvonne [name] notes polyphagia and she is working on diet and weight loss. She had some GI upset with Saxenda in the past. She denies pancreatitis.   2. Vitamin D deficiency Yvonne [name] is stable on Vit D, and she requests a refill today. She has no signs of over-replacement.  3. Essential hypertension Yvonne [name]'s blood pressure is stable on her medications. She requests a refill of hydrochlorothiazide today. She continues to work on diet and exercise.  4. Other depression, with emotional eating Yvonne [name] is stable on Lexapro, and she is working on decreasing emotional eating behaviors. She requests a refill today.  5. At risk for diabetes mellitus Yvonne [name] is at higher than average risk for developing diabetes due to obesity.   Assessment/Plan:   1. Pre-diabetes Yvonne [name] agreed to start Ozempic 0.25 mg q weekly, and we will continue to monitor. She will continue to work on weight loss, exercise, and decreasing simple carbohydrates to help decrease the risk of diabetes.   - Semaglutide,0.25 or 0.5MG /DOS, (OZEMPIC, 0.25 OR 0.5 MG/DOSE,) 2 MG/1.5ML SOPN; Inject 0.5 mg into the skin once a week.  Dispense: 1.5 mL; Refill: 0  2. Vitamin D  deficiency Low Vitamin D level contributes to fatigue and are associated with obesity, breast, and colon cancer. We will refill prescription Vitamin D for 1 month. Yvonne [name] will follow-up for routine testing of Vitamin D, at least 2-3 times per year to avoid over-replacement.  - Vitamin D, Ergocalciferol, (DRISDOL) 1.25 MG (50000 UNIT) CAPS capsule; TAKE 1 CAPSULE BY MOUTH EVERY 7 DAYS.  Dispense: 4 capsule; Refill: 0  3. Essential hypertension Yvonne [name] is working on healthy weight loss and exercise to improve blood pressure control. We will watch for signs of hypotension as she continues her lifestyle modifications. We will refill hydrochlorothiazide for 1 month.  - hydrochlorothiazide (HYDRODIURIL) 12.5 MG tablet; TAKE 1 TABLET BY MOUTH ONCE A DAY  Dispense: 30 tablet; Refill: 0  4. Other depression, with emotional eating Behavior modification techniques were discussed today to help Yvonne [name] deal with her emotional/non-hunger eating behaviors. We will refill Lexapro for 1 month. Orders and follow up as documented in patient record.   - escitalopram (LEXAPRO) 20 MG tablet; TAKE 1 TABLET BY MOUTH DAILY.  Dispense: 30 tablet; Refill: 0  5. At risk for diabetes mellitus Yvonne [name] was given approximately 15 minutes of diabetes education and counseling today. We discussed intensive lifestyle modifications today with an emphasis on weight loss as well as increasing exercise and decreasing simple carbohydrates in her diet. We also reviewed medication options with an emphasis on risk versus benefit of those discussed.   Repetitive spaced learning was employed today to elicit superior memory formation and behavioral change.  6. Obesity with current BMI 35.2 Yvonne [name] is currently in the action stage of change. As such, her goal is to continue with weight loss efforts. She has agreed to change to following a lower carbohydrate, vegetable and lean protein rich diet plan.   We discussed various medication options to  help [facility] with her weight loss efforts and we both agreed to discontinue Contrave, and she will start Ozempic.  Exercise goals: As is.  Behavioral modification strategies: increasing lean protein intake, increasing vegetables, and increasing water intake.  Yvonne [name] has agreed to follow-up with our clinic in 2 to 3 weeks. She was informed of the importance of frequent follow-up visits to maximize her success with intensive lifestyle modifications for her multiple health conditions.   Objective:   Blood pressure 131/82, pulse 69, temperature 98 F (36.7 C), height 5\' 5"  (1.651 m), weight 211 lb (95.7 kg), SpO2 99 %. Body mass index is 35.11 kg/m.  General: Cooperative, alert, well developed, in no acute distress. HEENT: Conjunctivae and lids unremarkable. Cardiovascular: Regular rhythm.  Lungs: Normal work of breathing. Neurologic: No focal deficits.   Lab Results  Component Value Date   CREATININE 0.84 01/08/2021   BUN 12 01/08/2021   NA 141 01/08/2021   K 4.7 01/08/2021   CL 103 01/08/2021   CO2 21 01/08/2021   Lab Results  Component Value Date   ALT 16 01/08/2021   AST 15 01/08/2021   ALKPHOS 90 01/08/2021   BILITOT <0.2 01/08/2021   Lab Results  Component Value Date   HGBA1C 6.1 (H) 01/08/2021   HGBA1C 5.9 (H) 11/09/2019   HGBA1C 5.9 (H) 12/26/2018   HGBA1C 5.6 02/15/2018   HGBA1C 5.7 (H) 09/13/2017   Lab Results  Component Value Date   INSULIN 37.3 (H) 01/08/2021   INSULIN 33.2 (H) 11/09/2019   INSULIN 9.3 12/26/2018   INSULIN 28.8 (H) 02/15/2018   INSULIN 35.7 (H) 09/13/2017   Lab Results  Component Value Date   TSH 0.77 04/28/2019   Lab Results  Component Value Date   CHOL 190 01/08/2021   HDL 60 01/08/2021   LDLCALC 112 (H) 01/08/2021   TRIG 100 01/08/2021   CHOLHDL 3.4 04/28/2019   Lab Results  Component Value Date   WBC 11.6 (H) 07/06/2019   HGB 13.6 07/06/2019   HCT 42.1 07/06/2019   MCV 97.0 07/06/2019   PLT 306 07/06/2019   No  results found for: IRON, TIBC, FERRITIN  Attestation Statements:   Reviewed by clinician on day of visit: allergies, medications, problem list, medical history, surgical history, family history, social history, and previous encounter notes.   I, Trixie Dredge, am acting as transcriptionist for Dennard Nip, MD.  I have reviewed the above documentation for accuracy and completeness, and I agree with the above. -  Dennard Nip, MD

## 2021-06-10 ENCOUNTER — Other Ambulatory Visit (HOSPITAL_COMMUNITY): Payer: Self-pay

## 2021-06-10 ENCOUNTER — Other Ambulatory Visit: Payer: Self-pay

## 2021-06-10 ENCOUNTER — Other Ambulatory Visit (INDEPENDENT_AMBULATORY_CARE_PROVIDER_SITE_OTHER): Payer: Self-pay | Admitting: Family Medicine

## 2021-06-10 ENCOUNTER — Ambulatory Visit (INDEPENDENT_AMBULATORY_CARE_PROVIDER_SITE_OTHER): Payer: 59 | Admitting: Family Medicine

## 2021-06-10 ENCOUNTER — Encounter (INDEPENDENT_AMBULATORY_CARE_PROVIDER_SITE_OTHER): Payer: Self-pay | Admitting: Family Medicine

## 2021-06-10 VITALS — BP 103/71 | HR 93 | Temp 98.0°F | Ht 65.0 in | Wt 201.0 lb

## 2021-06-10 DIAGNOSIS — Z9189 Other specified personal risk factors, not elsewhere classified: Secondary | ICD-10-CM

## 2021-06-10 DIAGNOSIS — E669 Obesity, unspecified: Secondary | ICD-10-CM | POA: Diagnosis not present

## 2021-06-10 DIAGNOSIS — E1169 Type 2 diabetes mellitus with other specified complication: Secondary | ICD-10-CM | POA: Diagnosis not present

## 2021-06-10 DIAGNOSIS — I10 Essential (primary) hypertension: Secondary | ICD-10-CM

## 2021-06-10 DIAGNOSIS — Z6833 Body mass index (BMI) 33.0-33.9, adult: Secondary | ICD-10-CM | POA: Diagnosis not present

## 2021-06-10 DIAGNOSIS — E559 Vitamin D deficiency, unspecified: Secondary | ICD-10-CM

## 2021-06-10 DIAGNOSIS — F3289 Other specified depressive episodes: Secondary | ICD-10-CM

## 2021-06-10 MED ORDER — OZEMPIC (0.25 OR 0.5 MG/DOSE) 2 MG/1.5ML ~~LOC~~ SOPN
0.5000 mg | PEN_INJECTOR | SUBCUTANEOUS | 0 refills | Status: DC
  Filled 2021-06-10: qty 1.5, 28d supply, fill #0

## 2021-06-10 MED ORDER — ESCITALOPRAM OXALATE 20 MG PO TABS
ORAL_TABLET | Freq: Every day | ORAL | 0 refills | Status: DC
  Filled 2021-06-10: qty 30, 30d supply, fill #0

## 2021-06-10 MED ORDER — VITAMIN D (ERGOCALCIFEROL) 1.25 MG (50000 UNIT) PO CAPS
ORAL_CAPSULE | ORAL | 0 refills | Status: DC
  Filled 2021-06-10: qty 4, 28d supply, fill #0

## 2021-06-10 MED ORDER — HYDROCHLOROTHIAZIDE 12.5 MG PO TABS
ORAL_TABLET | Freq: Every day | ORAL | 0 refills | Status: DC
  Filled 2021-06-10: qty 30, 30d supply, fill #0

## 2021-06-11 ENCOUNTER — Other Ambulatory Visit (HOSPITAL_COMMUNITY): Payer: Self-pay

## 2021-06-11 MED ORDER — OZEMPIC (0.25 OR 0.5 MG/DOSE) 2 MG/1.5ML ~~LOC~~ SOPN
0.5000 mg | PEN_INJECTOR | SUBCUTANEOUS | 0 refills | Status: DC
  Filled 2021-06-11: qty 4.5, 84d supply, fill #0

## 2021-06-11 NOTE — Telephone Encounter (Signed)
Dr.Beasley 

## 2021-06-11 NOTE — Telephone Encounter (Signed)
Pt request 90 day supply, ok ?

## 2021-06-11 NOTE — Telephone Encounter (Signed)
ok 

## 2021-06-12 ENCOUNTER — Ambulatory Visit (INDEPENDENT_AMBULATORY_CARE_PROVIDER_SITE_OTHER): Payer: 59 | Admitting: Family Medicine

## 2021-06-16 NOTE — Progress Notes (Signed)
Chief Complaint:   OBESITY Damica is here to discuss her progress with her obesity treatment plan along with follow-up of her obesity related diagnoses. Tichina is on following a lower carbohydrate, vegetable and lean protein rich diet plan and states she is following her eating plan approximately 95% of the time. Meeah states she is walking the dog for 30 minutes 7 times per week.  Today's visit was #: 58 Starting weight: 204 lbs Starting date: 05/17/2017 Today's weight: 201 lbs Today's date: 06/10/2021 Total lbs lost to date: 3 Total lbs lost since last in-office visit: 10  Interim History: Seniya has done very well with weight loss on her Low carbohydrate plan. Her hunger is controlled and she is working on meeting her protein and vegetable goals. She would like to add lower sugar fruit to her plan.  Subjective:   1. Type 2 diabetes mellitus with other specified complication, without long-term current use of insulin ([place]) Delcenia started on Ozempic and she notes decreased polyphagia. She denies nausea or vomiting. She is doing very well with diet and exercise.  2. Essential hypertension Aren's blood pressure is well controlled on her medications. She is working on hydrating.  3. Vitamin D deficiency Lavelle is stable on Vit D, and she denies signs of over-replacement.  4. Other depression, with emotional eating Lacretia's mood is stable on Lexapro, and she is doing well with decreasing emotional eating behaviors.  5. At risk for dehydration Tattianna is at risk for dehydration due to inadequate water intake.  Assessment/Plan:   1. Type 2 diabetes mellitus with other specified complication, without long-term current use of insulin ([facility]) We will refill Ozempic 0.25 mg, and will continue at the current dose for now. Good blood sugar control is important to decrease the likelihood of diabetic complications such as nephropathy, neuropathy, limb loss, blindness, coronary artery  disease, and death. Intensive lifestyle modification including diet, exercise and weight loss are the first line of treatment for diabetes.   2. Essential hypertension Aeriana will continue working on healthy weight loss and exercise to improve blood pressure control. We will refill hydrochlorothiazide for 1 month, and will watch for signs of hypotension as she continues her lifestyle modifications.  - hydrochlorothiazide (HYDRODIURIL) 12.5 MG tablet; TAKE 1 TABLET BY MOUTH ONCE A DAY  Dispense: 30 tablet; Refill: 0  3. Vitamin D deficiency Low Vitamin D level contributes to fatigue and are associated with obesity, breast, and colon cancer. We will refill prescription Vitamin D for 1 month. Rosaura will follow-up for routine testing of Vitamin D, at least 2-3 times per year to avoid over-replacement.  - Vitamin D, Ergocalciferol, (DRISDOL) 1.25 MG (50000 UNIT) CAPS capsule; TAKE 1 CAPSULE BY MOUTH EVERY 7 DAYS.  Dispense: 4 capsule; Refill: 0  4. Other depression, with emotional eating Behavior modification techniques were discussed today to help [facility] deal with her emotional/non-hunger eating behaviors. We will refill Lexapro for 1 month. Orders and follow up as documented in patient record.   - escitalopram (LEXAPRO) 20 MG tablet; TAKE 1 TABLET BY MOUTH DAILY.  Dispense: 30 tablet; Refill: 0  5. At risk for dehydration Jaidyn was given approximately 15 minutes dehydration prevention counseling today. Milany is at risk for dehydration due to weight loss and current medication(s). She was encouraged to hydrate and monitor fluid status to avoid dehydration as well as weight loss plateaus.   6. Obesity with current BMI of 33.4 Gittel is currently in the action stage of change. As  such, her goal is to continue with weight loss efforts. She has agreed to following a lower carbohydrate, vegetable and lean protein rich diet plan.   Low sugar fruit options were discussed.  Exercise goals: As  is.  Behavioral modification strategies: increasing water intake.  Italia has agreed to follow-up with our clinic in 2 to 3 weeks. She was informed of the importance of frequent follow-up visits to maximize her success with intensive lifestyle modifications for her multiple health conditions.   Objective:   Blood pressure 103/71, pulse 93, temperature 98 F (36.7 C), height 5\' 5"  (1.651 m), weight 201 lb (91.2 kg), SpO2 98 %. Body mass index is 33.45 kg/m.  General: Cooperative, alert, well developed, in no acute distress. HEENT: Conjunctivae and lids unremarkable. Cardiovascular: Regular rhythm.  Lungs: Normal work of breathing. Neurologic: No focal deficits.   Lab Results  Component Value Date   CREATININE 0.84 01/08/2021   BUN 12 01/08/2021   NA 141 01/08/2021   K 4.7 01/08/2021   CL 103 01/08/2021   CO2 21 01/08/2021   Lab Results  Component Value Date   ALT 16 01/08/2021   AST 15 01/08/2021   ALKPHOS 90 01/08/2021   BILITOT <0.2 01/08/2021   Lab Results  Component Value Date   HGBA1C 6.1 (H) 01/08/2021   HGBA1C 5.9 (H) 11/09/2019   HGBA1C 5.9 (H) 12/26/2018   HGBA1C 5.6 02/15/2018   HGBA1C 5.7 (H) 09/13/2017   Lab Results  Component Value Date   INSULIN 37.3 (H) 01/08/2021   INSULIN 33.2 (H) 11/09/2019   INSULIN 9.3 12/26/2018   INSULIN 28.8 (H) 02/15/2018   INSULIN 35.7 (H) 09/13/2017   Lab Results  Component Value Date   TSH 0.77 04/28/2019   Lab Results  Component Value Date   CHOL 190 01/08/2021   HDL 60 01/08/2021   LDLCALC 112 (H) 01/08/2021   TRIG 100 01/08/2021   CHOLHDL 3.4 04/28/2019   Lab Results  Component Value Date   VD25OH 21.8 (L) 01/08/2021   VD25OH 44.2 11/09/2019   VD25OH 35.9 12/26/2018   Lab Results  Component Value Date   WBC 11.6 (H) 07/06/2019   HGB 13.6 07/06/2019   HCT 42.1 07/06/2019   MCV 97.0 07/06/2019   PLT 306 07/06/2019   No results found for: IRON, TIBC, FERRITIN  Attestation Statements:   Reviewed  by clinician on day of visit: allergies, medications, problem list, medical history, surgical history, family history, social history, and previous encounter notes.   I, Trixie Dredge, am acting as transcriptionist for Dennard Nip, MD.  I have reviewed the above documentation for accuracy and completeness, and I agree with the above. -  Dennard Nip, MD

## 2021-06-23 ENCOUNTER — Other Ambulatory Visit (HOSPITAL_COMMUNITY): Payer: Self-pay

## 2021-06-30 ENCOUNTER — Other Ambulatory Visit (HOSPITAL_BASED_OUTPATIENT_CLINIC_OR_DEPARTMENT_OTHER): Payer: Self-pay

## 2021-06-30 MED FILL — Norethindrone Ace & Ethinyl Estradiol Tab 1 MG-20 MCG: ORAL | 84 days supply | Qty: 84 | Fill #1 | Status: AC

## 2021-07-01 ENCOUNTER — Other Ambulatory Visit (HOSPITAL_BASED_OUTPATIENT_CLINIC_OR_DEPARTMENT_OTHER): Payer: Self-pay

## 2021-07-08 ENCOUNTER — Other Ambulatory Visit (HOSPITAL_COMMUNITY): Payer: Self-pay

## 2021-07-08 ENCOUNTER — Other Ambulatory Visit: Payer: Self-pay

## 2021-07-08 ENCOUNTER — Encounter (INDEPENDENT_AMBULATORY_CARE_PROVIDER_SITE_OTHER): Payer: Self-pay | Admitting: Family Medicine

## 2021-07-08 ENCOUNTER — Ambulatory Visit (INDEPENDENT_AMBULATORY_CARE_PROVIDER_SITE_OTHER): Payer: 59 | Admitting: Family Medicine

## 2021-07-08 VITALS — BP 111/74 | HR 90 | Temp 98.1°F | Ht 65.0 in | Wt 204.0 lb

## 2021-07-08 DIAGNOSIS — E669 Obesity, unspecified: Secondary | ICD-10-CM

## 2021-07-08 DIAGNOSIS — R7303 Prediabetes: Secondary | ICD-10-CM | POA: Diagnosis not present

## 2021-07-08 DIAGNOSIS — I10 Essential (primary) hypertension: Secondary | ICD-10-CM | POA: Diagnosis not present

## 2021-07-08 DIAGNOSIS — Z6833 Body mass index (BMI) 33.0-33.9, adult: Secondary | ICD-10-CM

## 2021-07-08 DIAGNOSIS — F3289 Other specified depressive episodes: Secondary | ICD-10-CM

## 2021-07-08 DIAGNOSIS — E559 Vitamin D deficiency, unspecified: Secondary | ICD-10-CM | POA: Diagnosis not present

## 2021-07-08 DIAGNOSIS — Z9189 Other specified personal risk factors, not elsewhere classified: Secondary | ICD-10-CM | POA: Diagnosis not present

## 2021-07-08 MED ORDER — ESCITALOPRAM OXALATE 20 MG PO TABS
ORAL_TABLET | Freq: Every day | ORAL | 0 refills | Status: DC
  Filled 2021-07-08: qty 30, 30d supply, fill #0

## 2021-07-08 MED ORDER — VITAMIN D (ERGOCALCIFEROL) 1.25 MG (50000 UNIT) PO CAPS
ORAL_CAPSULE | ORAL | 0 refills | Status: DC
  Filled 2021-07-08: qty 4, 28d supply, fill #0

## 2021-07-08 MED ORDER — HYDROCHLOROTHIAZIDE 12.5 MG PO TABS
ORAL_TABLET | Freq: Every day | ORAL | 0 refills | Status: DC
  Filled 2021-07-08: qty 30, 30d supply, fill #0

## 2021-07-14 NOTE — Progress Notes (Signed)
Chief Complaint:   OBESITY Yvonne [name] is here to discuss her progress with her obesity treatment plan along with follow-up of her obesity related diagnoses. Yvonne [name] is on following a lower carbohydrate, vegetable and lean protein rich diet plan and states she is following her eating plan approximately 70% of the time. Yvonne [name] states she is walking for 30 minutes 5 times per week.  Today's visit was #: 67 Starting weight: 204 lbs Starting date: 05/17/2017 Today's weight: 204 lbs Today's date: 07/08/2021 Total lbs lost to date: 0 Total lbs lost since last in-office visit: 0  Interim History: Yvonne [name] has had increased stress with her daughter changing schools, and she hasn't been quite as strict on her eating plan.  Subjective:   1. Pre-diabetes Yvonne [name] is stable on Ozempic but she notes polyphagia is starting to increase.  2. Essential hypertension Yvonne [name]'s blood pressure is stable on her medications. She denies chest pain.  3. Vitamin D deficiency Yvonne [name] is on Vit D, but her level is not yet at goal.  4. Other depression, with emotional eating Yvonne [name] has had increased stress and she notes some increased traveling, but she is still eating what she should.  5. At risk for heart disease Yvonne [name] is at a higher than average risk for cardiovascular disease due to obesity.   Assessment/Plan:   1. Pre-diabetes Yvonne [name] agreed to increase Ozempic to 0.5 mg q weekly (no refill needed). We will follow up at her next visit. She will continue to work on weight loss, exercise, and decreasing simple carbohydrates to help decrease the risk of diabetes.   2. Essential hypertension Yvonne [name] will continue working on healthy weight loss and exercise to improve blood pressure control. We will watch for signs of hypotension as she continues her lifestyle modifications. We will refill hydrochlorothiazide for 1 month.  - hydrochlorothiazide (HYDRODIURIL) 12.5 MG tablet; TAKE 1 TABLET BY MOUTH ONCE A DAY   Dispense: 30 tablet; Refill: 0  3. Vitamin D deficiency Low Vitamin D level contributes to fatigue and are associated with obesity, breast, and colon cancer. We will refill prescription Vitamin D for 1 month. Yvonne [name] will follow-up for routine testing of Vitamin D, at least 2-3 times per year to avoid over-replacement.  - Vitamin D, Ergocalciferol, (DRISDOL) 1.25 MG (50000 UNIT) CAPS capsule; TAKE 1 CAPSULE BY MOUTH EVERY 7 DAYS.  Dispense: 4 capsule; Refill: 0  4. Other depression, with emotional eating Behavior modification techniques were discussed today to help Yvonne [name] deal with her emotional/non-hunger eating behaviors. We will refill Yvonne [name] for 1 month. Orders and follow up as documented in patient record.   - escitalopram (Yvonne [name]) 20 MG tablet; TAKE 1 TABLET BY MOUTH DAILY.  Dispense: 30 tablet; Refill: 0  5. At risk for heart disease Yvonne [name] was given approximately 15 minutes of coronary artery disease prevention counseling today. She is 50 y.o. female and has risk factors for heart disease including obesity. We discussed intensive lifestyle modifications today with an emphasis on specific weight loss instructions and strategies.   Repetitive spaced learning was employed today to elicit superior memory formation and behavioral change.  6. Obesity with current BMI 33.95 Yvonne [name] is currently in the action stage of change. As such, her goal is to continue with weight loss efforts. She has agreed to following a lower carbohydrate, vegetable and lean protein rich diet plan.   Exercise goals: As is.  Behavioral modification strategies: increasing lean protein intake and decreasing simple carbohydrates.  Yvonne [name] has agreed to follow-up with  our clinic in 3 weeks. She was informed of the importance of frequent follow-up visits to maximize her success with intensive lifestyle modifications for her multiple health conditions.   Objective:   Blood pressure 111/74, pulse 90, temperature 98.1 F  (36.7 C), height '5\' 5"'$  (1.651 m), weight 204 lb (92.5 kg), SpO2 97 %. Body mass index is 33.95 kg/m.  General: Cooperative, alert, well developed, in no acute distress. HEENT: Conjunctivae and lids unremarkable. Cardiovascular: Regular rhythm.  Lungs: Normal work of breathing. Neurologic: No focal deficits.   Lab Results  Component Value Date   CREATININE 0.84 01/08/2021   BUN 12 01/08/2021   NA 141 01/08/2021   K 4.7 01/08/2021   CL 103 01/08/2021   CO2 21 01/08/2021   Lab Results  Component Value Date   ALT 16 01/08/2021   AST 15 01/08/2021   ALKPHOS 90 01/08/2021   BILITOT <0.2 01/08/2021   Lab Results  Component Value Date   HGBA1C 6.1 (H) 01/08/2021   HGBA1C 5.9 (H) 11/09/2019   HGBA1C 5.9 (H) 12/26/2018   HGBA1C 5.6 02/15/2018   HGBA1C 5.7 (H) 09/13/2017   Lab Results  Component Value Date   INSULIN 37.3 (H) 01/08/2021   INSULIN 33.2 (H) 11/09/2019   INSULIN 9.3 12/26/2018   INSULIN 28.8 (H) 02/15/2018   INSULIN 35.7 (H) 09/13/2017   Lab Results  Component Value Date   TSH 0.77 04/28/2019   Lab Results  Component Value Date   CHOL 190 01/08/2021   HDL 60 01/08/2021   LDLCALC 112 (H) 01/08/2021   TRIG 100 01/08/2021   CHOLHDL 3.4 04/28/2019   Lab Results  Component Value Date   VD25OH 21.8 (L) 01/08/2021   VD25OH 44.2 11/09/2019   VD25OH 35.9 12/26/2018   Lab Results  Component Value Date   WBC 11.6 (H) 07/06/2019   HGB 13.6 07/06/2019   HCT 42.1 07/06/2019   MCV 97.0 07/06/2019   PLT 306 07/06/2019   No results found for: IRON, TIBC, FERRITIN  Attestation Statements:   Reviewed by clinician on day of visit: allergies, medications, problem list, medical history, surgical history, family history, social history, and previous encounter notes.   I, Trixie Dredge, am acting as transcriptionist for Dennard Nip, MD.  I have reviewed the above documentation for accuracy and completeness, and I agree with the above. -  Dennard Nip,  MD

## 2021-07-29 ENCOUNTER — Ambulatory Visit (INDEPENDENT_AMBULATORY_CARE_PROVIDER_SITE_OTHER): Payer: 59 | Admitting: Family Medicine

## 2021-07-29 ENCOUNTER — Encounter (INDEPENDENT_AMBULATORY_CARE_PROVIDER_SITE_OTHER): Payer: Self-pay | Admitting: Family Medicine

## 2021-07-29 ENCOUNTER — Other Ambulatory Visit: Payer: Self-pay

## 2021-07-29 VITALS — BP 112/79 | HR 100 | Temp 98.1°F | Ht 65.0 in | Wt 198.0 lb

## 2021-07-29 DIAGNOSIS — I10 Essential (primary) hypertension: Secondary | ICD-10-CM | POA: Diagnosis not present

## 2021-07-29 DIAGNOSIS — Z6833 Body mass index (BMI) 33.0-33.9, adult: Secondary | ICD-10-CM

## 2021-07-29 DIAGNOSIS — E669 Obesity, unspecified: Secondary | ICD-10-CM | POA: Diagnosis not present

## 2021-07-29 NOTE — Progress Notes (Signed)
Chief Complaint:   OBESITY Yvonne [name] is here to discuss her progress with her obesity treatment plan along with follow-up of her obesity related diagnoses. Yvonne [name] is on following a lower carbohydrate, vegetable and lean protein rich diet plan and states she is following her eating plan approximately 90% of the time. Yvonne [name] states she is walking for 30 minutes 7 times per week.  Today's visit was #: 29 Starting weight: 204 lbs Starting date: 05/17/2017 Today's weight: 198 lbs Today's date: 07/29/2021 Total lbs lost to date: 6 Total lbs lost since last in-office visit: 6  Interim History: Yvonne [name] continues to do well with weight loss. She notes her hunger is controlled, and she is doing well with meal planning and prepping.  Subjective:   1. Hypertension, unspecified type Yvonne [name]'s blood pressure is well controlled. She is doing well with diet, exercise, and weight loss. She denies signs of hypotension.  Assessment/Plan:   1. Hypertension, unspecified type Yvonne [name] will continue with diet and healthy weight loss to improve blood pressure control. She will continue to follow up as directed, and will watch for signs of hypotension as she continues her lifestyle modifications.  2. Obesity with current BMI 32.9 Yvonne [name] is currently in the action stage of change. As such, her goal is to continue with weight loss efforts. She has agreed to following a lower carbohydrate, vegetable and lean protein rich diet plan.   Exercise goals: As is.  Behavioral modification strategies: increasing water intake and no skipping meals.  Yvonne [name] has agreed to follow-up with our clinic in 3 weeks. She was informed of the importance of frequent follow-up visits to maximize her success with intensive lifestyle modifications for her multiple health conditions.   Objective:   Blood pressure 112/79, pulse 100, temperature 98.1 F (36.7 C), height '5\' 5"'$  (1.651 m), weight 198 lb (89.8 kg), SpO2 98 %. Body mass  index is 32.95 kg/m.  General: Cooperative, alert, well developed, in no acute distress. HEENT: Conjunctivae and lids unremarkable. Cardiovascular: Regular rhythm.  Lungs: Normal work of breathing. Neurologic: No focal deficits.   Lab Results  Component Value Date   CREATININE 0.84 01/08/2021   BUN 12 01/08/2021   NA 141 01/08/2021   K 4.7 01/08/2021   CL 103 01/08/2021   CO2 21 01/08/2021   Lab Results  Component Value Date   ALT 16 01/08/2021   AST 15 01/08/2021   ALKPHOS 90 01/08/2021   BILITOT <0.2 01/08/2021   Lab Results  Component Value Date   HGBA1C 6.1 (H) 01/08/2021   HGBA1C 5.9 (H) 11/09/2019   HGBA1C 5.9 (H) 12/26/2018   HGBA1C 5.6 02/15/2018   HGBA1C 5.7 (H) 09/13/2017   Lab Results  Component Value Date   INSULIN 37.3 (H) 01/08/2021   INSULIN 33.2 (H) 11/09/2019   INSULIN 9.3 12/26/2018   INSULIN 28.8 (H) 02/15/2018   INSULIN 35.7 (H) 09/13/2017   Lab Results  Component Value Date   TSH 0.77 04/28/2019   Lab Results  Component Value Date   CHOL 190 01/08/2021   HDL 60 01/08/2021   LDLCALC 112 (H) 01/08/2021   TRIG 100 01/08/2021   CHOLHDL 3.4 04/28/2019   Lab Results  Component Value Date   VD25OH 21.8 (L) 01/08/2021   VD25OH 44.2 11/09/2019   VD25OH 35.9 12/26/2018   Lab Results  Component Value Date   WBC 11.6 (H) 07/06/2019   HGB 13.6 07/06/2019   HCT 42.1 07/06/2019   MCV 97.0 07/06/2019   PLT  306 07/06/2019   No results found for: IRON, TIBC, FERRITIN  Attestation Statements:   Reviewed by clinician on day of visit: allergies, medications, problem list, medical history, surgical history, family history, social history, and previous encounter notes.  Time spent on visit including pre-visit chart review and post-visit care and charting was 30 minutes.    I, Trixie Dredge, am acting as transcriptionist for Dennard Nip, MD.  I have reviewed the above documentation for accuracy and completeness, and I agree with the above.  -  Dennard Nip, MD

## 2021-08-19 ENCOUNTER — Encounter (INDEPENDENT_AMBULATORY_CARE_PROVIDER_SITE_OTHER): Payer: Self-pay | Admitting: Family Medicine

## 2021-08-19 ENCOUNTER — Other Ambulatory Visit: Payer: Self-pay

## 2021-08-19 ENCOUNTER — Other Ambulatory Visit (HOSPITAL_COMMUNITY): Payer: Self-pay

## 2021-08-19 ENCOUNTER — Ambulatory Visit (INDEPENDENT_AMBULATORY_CARE_PROVIDER_SITE_OTHER): Payer: 59 | Admitting: Family Medicine

## 2021-08-19 VITALS — BP 112/82 | HR 93 | Temp 98.1°F | Ht 65.0 in | Wt 196.0 lb

## 2021-08-19 DIAGNOSIS — Z6833 Body mass index (BMI) 33.0-33.9, adult: Secondary | ICD-10-CM | POA: Diagnosis not present

## 2021-08-19 DIAGNOSIS — F3289 Other specified depressive episodes: Secondary | ICD-10-CM | POA: Diagnosis not present

## 2021-08-19 DIAGNOSIS — E559 Vitamin D deficiency, unspecified: Secondary | ICD-10-CM

## 2021-08-19 DIAGNOSIS — Z9189 Other specified personal risk factors, not elsewhere classified: Secondary | ICD-10-CM

## 2021-08-19 DIAGNOSIS — I10 Essential (primary) hypertension: Secondary | ICD-10-CM

## 2021-08-19 DIAGNOSIS — E66811 Obesity, class 1: Secondary | ICD-10-CM

## 2021-08-19 DIAGNOSIS — E669 Obesity, unspecified: Secondary | ICD-10-CM | POA: Diagnosis not present

## 2021-08-19 DIAGNOSIS — E1169 Type 2 diabetes mellitus with other specified complication: Secondary | ICD-10-CM | POA: Diagnosis not present

## 2021-08-19 MED ORDER — SEMAGLUTIDE (1 MG/DOSE) 4 MG/3ML ~~LOC~~ SOPN
1.0000 mg | PEN_INJECTOR | SUBCUTANEOUS | 0 refills | Status: DC
  Filled 2021-08-19: qty 9, 84d supply, fill #0

## 2021-08-19 MED ORDER — VITAMIN D (ERGOCALCIFEROL) 1.25 MG (50000 UNIT) PO CAPS
50000.0000 [IU] | ORAL_CAPSULE | ORAL | 0 refills | Status: DC
  Filled 2021-08-19: qty 4, 28d supply, fill #0

## 2021-08-19 MED ORDER — HYDROCHLOROTHIAZIDE 12.5 MG PO TABS
12.5000 mg | ORAL_TABLET | Freq: Every day | ORAL | 0 refills | Status: DC
Start: 2021-08-19 — End: 2021-09-09
  Filled 2021-08-19: qty 30, 30d supply, fill #0

## 2021-08-19 MED ORDER — ESCITALOPRAM OXALATE 20 MG PO TABS
ORAL_TABLET | Freq: Every day | ORAL | 0 refills | Status: DC
  Filled 2021-08-19: qty 30, 30d supply, fill #0

## 2021-08-20 NOTE — Progress Notes (Signed)
Chief Complaint:   OBESITY Yvonne [name] is here to discuss her progress with her obesity treatment plan along with follow-up of her obesity related diagnoses. Yvonne [name] is on following a lower carbohydrate, vegetable and lean protein rich diet plan and states she is following her eating plan approximately 85% of the time. Yvonne [name] states she is walking for 30 minutes 7 times per week.  Today's visit was #: 85 Starting weight: 204 lbs Starting date: 05/17/2017 Today's weight: 196 lbs Today's date: 08/19/2021 Total lbs lost to date: 8 Total lbs lost since last in-office visit: 2  Interim History: Yvonne [name] continues to do well with weight loss. She has been working from home and she feels this helps with her weight loss.  Subjective:   1. Vitamin D deficiency Yvonne [name] is stable on Vit D, and she denies nausea, vomiting, or muscle weakness.  2. Type 2 diabetes mellitus with other specified complication, without long-term current use of insulin (Yvonne [name]) Yvonne [name] is doing well on Ozempic, and she denies nausea or vomiting. She notes decreased polyphagia but she feels she could do better on a higher dose.  3. Essential hypertension Yvonne [name]'s blood pressure is stable on hydrochlorothiazide, and she denies signs of hypotension.  4. Other depression, with emotional eating Yvonne [name] is stable on Lexapro. Her mood has improved and she is dealing with her stress in a healthier way.  5. At risk for impaired metabolic function Yvonne [name] is at increased risk for impaired metabolic function if calories or protein decreases.  Assessment/Plan:   1. Vitamin D deficiency Low Vitamin D level contributes to fatigue and are associated with obesity, breast, and colon cancer. We will refill prescription Vitamin D for 1 month. Yvonne [name] will follow-up for routine testing of Vitamin D, at least 2-3 times per year to avoid over-replacement.  - Vitamin D, Ergocalciferol, (DRISDOL) 1.25 MG (50000 UNIT) CAPS capsule; Take 1 capsule  (50,000 Units total) by mouth every 7 (seven) days.  Dispense: 4 capsule; Refill: 0  2. Type 2 diabetes mellitus with other specified complication, without long-term current use of insulin (Yvonne [name]) Yvonne [name] agreed to increase Ozempic to 1 mg q weekly, and we will refill for 90 days with no refills. Good blood sugar control is important to decrease the likelihood of diabetic complications such as nephropathy, neuropathy, limb loss, blindness, coronary artery disease, and death. Intensive lifestyle modification including diet, exercise and weight loss are the first line of treatment for diabetes.   - Semaglutide, 1 MG/DOSE, 4 MG/3ML SOPN; Inject 1 mg as directed once a week.  Dispense: 9 mL; Refill: 0  3. Essential hypertension Yvonne [name] is working on healthy weight loss and exercise to improve blood pressure control. We will refill hydrochlorothiazide for 1 month, and will watch for signs of hypotension as she continues her lifestyle modifications.  - hydrochlorothiazide (HYDRODIURIL) 12.5 MG tablet; Take 1 tablet (12.5 mg total) by mouth daily.  Dispense: 30 tablet; Refill: 0  4. Other depression, with emotional eating Behavior modification techniques were discussed today to help Yvonne [name] deal with her emotional/non-hunger eating behaviors. We will refill Lexapro for 1 month. Orders and follow up as documented in patient record.   - escitalopram (LEXAPRO) 20 MG tablet; TAKE 1 TABLET BY MOUTH DAILY.  Dispense: 30 tablet; Refill: 0  5. At risk for impaired metabolic function Yvonne [name] was given approximately 15 minutes of impaired  metabolic function prevention counseling today. We discussed intensive lifestyle modifications today with an emphasis on specific nutrition and exercise instructions and strategies.  Repetitive spaced learning was employed today to elicit superior memory formation and behavioral change.  6. Obesity with current BMI of 32.7 Yvonne [name] is currently in the action stage of change. As  such, her goal is to continue with weight loss efforts. She has agreed to following a lower carbohydrate, vegetable and lean protein rich diet plan.   Exercise goals: As is.  Behavioral modification strategies: increasing lean protein intake.  Yvonne [name] has agreed to follow-up with our clinic in 3 to 4 weeks. She was informed of the importance of frequent follow-up visits to maximize her success with intensive lifestyle modifications for her multiple health conditions.   Objective:   Blood pressure 112/82, pulse 93, temperature 98.1 F (36.7 C), height '5\' 5"'$  (1.651 m), weight 196 lb (88.9 kg), SpO2 98 %. Body mass index is 32.62 kg/m.  General: Cooperative, alert, well developed, in no acute distress. HEENT: Conjunctivae and lids unremarkable. Cardiovascular: Regular rhythm.  Lungs: Normal work of breathing. Neurologic: No focal deficits.   Lab Results  Component Value Date   CREATININE 0.84 01/08/2021   BUN 12 01/08/2021   NA 141 01/08/2021   K 4.7 01/08/2021   CL 103 01/08/2021   CO2 21 01/08/2021   Lab Results  Component Value Date   ALT 16 01/08/2021   AST 15 01/08/2021   ALKPHOS 90 01/08/2021   BILITOT <0.2 01/08/2021   Lab Results  Component Value Date   HGBA1C 6.1 (H) 01/08/2021   HGBA1C 5.9 (H) 11/09/2019   HGBA1C 5.9 (H) 12/26/2018   HGBA1C 5.6 02/15/2018   HGBA1C 5.7 (H) 09/13/2017   Lab Results  Component Value Date   INSULIN 37.3 (H) 01/08/2021   INSULIN 33.2 (H) 11/09/2019   INSULIN 9.3 12/26/2018   INSULIN 28.8 (H) 02/15/2018   INSULIN 35.7 (H) 09/13/2017   Lab Results  Component Value Date   TSH 0.77 04/28/2019   Lab Results  Component Value Date   CHOL 190 01/08/2021   HDL 60 01/08/2021   LDLCALC 112 (H) 01/08/2021   TRIG 100 01/08/2021   CHOLHDL 3.4 04/28/2019   Lab Results  Component Value Date   VD25OH 21.8 (L) 01/08/2021   VD25OH 44.2 11/09/2019   VD25OH 35.9 12/26/2018   Lab Results  Component Value Date   WBC 11.6 (H)  07/06/2019   HGB 13.6 07/06/2019   HCT 42.1 07/06/2019   MCV 97.0 07/06/2019   PLT 306 07/06/2019   No results found for: IRON, TIBC, FERRITIN  Attestation Statements:   Reviewed by clinician on day of visit: allergies, medications, problem list, medical history, surgical history, family history, social history, and previous encounter notes.   I, Trixie Dredge, am acting as transcriptionist for Dennard Nip, MD.  I have reviewed the above documentation for accuracy and completeness, and I agree with the above. -  Dennard Nip, MD

## 2021-09-09 ENCOUNTER — Encounter (INDEPENDENT_AMBULATORY_CARE_PROVIDER_SITE_OTHER): Payer: Self-pay | Admitting: Family Medicine

## 2021-09-09 ENCOUNTER — Other Ambulatory Visit: Payer: Self-pay

## 2021-09-09 ENCOUNTER — Ambulatory Visit (INDEPENDENT_AMBULATORY_CARE_PROVIDER_SITE_OTHER): Payer: 59 | Admitting: Family Medicine

## 2021-09-09 ENCOUNTER — Other Ambulatory Visit (HOSPITAL_COMMUNITY): Payer: Self-pay

## 2021-09-09 VITALS — BP 121/81 | HR 108 | Temp 98.1°F | Ht 65.0 in

## 2021-09-09 DIAGNOSIS — Z9189 Other specified personal risk factors, not elsewhere classified: Secondary | ICD-10-CM | POA: Diagnosis not present

## 2021-09-09 DIAGNOSIS — I10 Essential (primary) hypertension: Secondary | ICD-10-CM

## 2021-09-09 DIAGNOSIS — E669 Obesity, unspecified: Secondary | ICD-10-CM | POA: Diagnosis not present

## 2021-09-09 DIAGNOSIS — E559 Vitamin D deficiency, unspecified: Secondary | ICD-10-CM | POA: Diagnosis not present

## 2021-09-09 DIAGNOSIS — E66811 Obesity, class 1: Secondary | ICD-10-CM

## 2021-09-09 DIAGNOSIS — Z6833 Body mass index (BMI) 33.0-33.9, adult: Secondary | ICD-10-CM

## 2021-09-09 DIAGNOSIS — F3289 Other specified depressive episodes: Secondary | ICD-10-CM

## 2021-09-09 MED ORDER — VITAMIN D (ERGOCALCIFEROL) 1.25 MG (50000 UNIT) PO CAPS
50000.0000 [IU] | ORAL_CAPSULE | ORAL | 0 refills | Status: DC
  Filled 2021-09-09: qty 4, 28d supply, fill #0

## 2021-09-09 MED ORDER — ESCITALOPRAM OXALATE 20 MG PO TABS
ORAL_TABLET | Freq: Every day | ORAL | 0 refills | Status: DC
  Filled 2021-09-09 – 2021-09-10 (×2): qty 30, 30d supply, fill #0

## 2021-09-09 MED ORDER — HYDROCHLOROTHIAZIDE 12.5 MG PO TABS
12.5000 mg | ORAL_TABLET | Freq: Every day | ORAL | 0 refills | Status: DC
  Filled 2021-09-09: qty 30, 30d supply, fill #0

## 2021-09-09 NOTE — Progress Notes (Signed)
Chief Complaint:   OBESITY Yvonne [name] [name] here to discuss her progress with her obesity treatment plan along with follow-up of her obesity related diagnoses. Yvonne [name] [name] on following a lower carbohydrate, vegetable and lean protein rich diet plan and states she [name] following her eating plan approximately 90% of the time. Yvonne [name] states she [name] walking for 30 minutes 5 times per week.  Today's visit was #: 66 Starting weight: 204 lbs Starting date: 05/17/2017 Today's weight: 194 lbs Today's date: 09/09/2021 Total lbs lost to date: 10 Total lbs lost since last in-office visit: 2  Interim History: Yvonne [name] continues to work on diet and weight loss. She does best with a lower carbohydrate plan with decreased saturated fat. She [name] doing well eating vegetable but may not be meeting her protein goals.  Subjective:   1. Essential hypertension Yvonne [name]'s blood pressure [name] well controlled on her medications. She [name] denies signs of hypotension.  2. Vitamin D deficiency Yvonne [name] [name] stable on Vit D, and she denies signs of over-replacement.  3. Other depression, with emotional eating Yvonne [name] [name] stable on Lexapro, and she [name] dealing with stress reasonably well. She [name] doing well with decreasing emotional eating behaviors.  4. At risk for impaired metabolic function Yvonne [name] [name] at increased risk for impaired metabolic function if protein decreases.  Assessment/Plan:   1. Essential hypertension Yvonne [name] will continue working on healthy weight loss and exercise to improve blood pressure control. We will refill hydrochlorothiazide for 1 month, and will watch for signs of hypotension Yvonne she continues her lifestyle modifications.  - hydrochlorothiazide (HYDRODIURIL) 12.5 MG tablet; Take 1 tablet (12.5 mg total) by mouth daily.  Dispense: 30 tablet; Refill: 0  2. Vitamin D deficiency Low Vitamin D level contributes to fatigue and are associated with obesity, breast, and colon cancer. We will refill prescription  Vitamin D for 1 month. Yvonne [name] will follow-up for routine testing of Vitamin D, at least 2-3 times per year to avoid over-replacement.  - Vitamin D, Ergocalciferol, (DRISDOL) 1.25 MG (50000 UNIT) CAPS capsule; Take 1 capsule by mouth every 7 (seven) days.  Dispense: 4 capsule; Refill: 0  3. Other depression, with emotional eating Behavior modification techniques were discussed today to help Yvonne [name] deal with her emotional/non-hunger eating behaviors. We will refill Lexapro for 1 month. Orders and follow up Yvonne documented in patient record.   - escitalopram (LEXAPRO) 20 MG tablet; TAKE 1 TABLET BY MOUTH DAILY.  Dispense: 30 tablet; Refill: 0  4. At risk for impaired metabolic function Yvonne [name] was given approximately 30 minutes of impaired  metabolic function prevention counseling today. We discussed intensive lifestyle modifications today with an emphasis on specific nutrition and exercise instructions and strategies.   Repetitive spaced learning was employed today to elicit superior memory formation and behavioral change.  5. Obesity with current BMI of 32.4 Yvonne [name] [name] currently in the action stage of change. Yvonne such, her goal [name] to continue with weight loss efforts. She has agreed to following a lower carbohydrate, vegetable and lean protein rich diet plan.   Low sugar fruit list was given today.  Exercise goals: Yvonne [name].  Behavioral modification strategies: increasing lean protein intake.  Yvonne [name] has agreed to follow-up with our clinic in 3 weeks. She was informed of the importance of frequent follow-up visits to maximize her success with intensive lifestyle modifications for her multiple health conditions.   Objective:   Blood pressure 121/81, pulse (!) 108, temperature 98.1 F (36.7 C), height 5\' 5"  (1.651  m), SpO2 97 %. Body mass index [name] 32.62 kg/m.  General: Cooperative, alert, well developed, in no acute distress. HEENT: Conjunctivae and lids unremarkable. Cardiovascular: Regular  rhythm.  Lungs: Normal work of breathing. Neurologic: No focal deficits.   Lab Results  Component Value Date   CREATININE 0.84 01/08/2021   BUN 12 01/08/2021   NA 141 01/08/2021   K 4.7 01/08/2021   CL 103 01/08/2021   CO2 21 01/08/2021   Lab Results  Component Value Date   ALT 16 01/08/2021   AST 15 01/08/2021   ALKPHOS 90 01/08/2021   BILITOT <0.2 01/08/2021   Lab Results  Component Value Date   HGBA1C 6.1 (H) 01/08/2021   HGBA1C 5.9 (H) 11/09/2019   HGBA1C 5.9 (H) 12/26/2018   HGBA1C 5.6 02/15/2018   HGBA1C 5.7 (H) 09/13/2017   Lab Results  Component Value Date   INSULIN 37.3 (H) 01/08/2021   INSULIN 33.2 (H) 11/09/2019   INSULIN 9.3 12/26/2018   INSULIN 28.8 (H) 02/15/2018   INSULIN 35.7 (H) 09/13/2017   Lab Results  Component Value Date   TSH 0.77 04/28/2019   Lab Results  Component Value Date   CHOL 190 01/08/2021   HDL 60 01/08/2021   LDLCALC 112 (H) 01/08/2021   TRIG 100 01/08/2021   CHOLHDL 3.4 04/28/2019   Lab Results  Component Value Date   VD25OH 21.8 (L) 01/08/2021   VD25OH 44.2 11/09/2019   VD25OH 35.9 12/26/2018   Lab Results  Component Value Date   WBC 11.6 (H) 07/06/2019   HGB 13.6 07/06/2019   HCT 42.1 07/06/2019   MCV 97.0 07/06/2019   PLT 306 07/06/2019   No results found for: IRON, TIBC, FERRITIN  Attestation Statements:   Reviewed by clinician on day of visit: allergies, medications, problem list, medical history, surgical history, family history, social history, and previous encounter notes.   I, Trixie Dredge, am acting Yvonne transcriptionist for Dennard Nip, MD.  I have reviewed the above documentation for accuracy and completeness, and I agree with the above. -  Dennard Nip, MD

## 2021-09-10 ENCOUNTER — Other Ambulatory Visit (HOSPITAL_COMMUNITY): Payer: Self-pay

## 2021-09-11 ENCOUNTER — Other Ambulatory Visit (HOSPITAL_COMMUNITY): Payer: Self-pay

## 2021-09-12 ENCOUNTER — Other Ambulatory Visit (HOSPITAL_COMMUNITY): Payer: Self-pay

## 2021-09-25 ENCOUNTER — Other Ambulatory Visit (HOSPITAL_BASED_OUTPATIENT_CLINIC_OR_DEPARTMENT_OTHER): Payer: Self-pay

## 2021-09-25 MED FILL — Norethindrone Ace & Ethinyl Estradiol Tab 1 MG-20 MCG: ORAL | 84 days supply | Qty: 84 | Fill #2 | Status: AC

## 2021-09-26 ENCOUNTER — Other Ambulatory Visit (HOSPITAL_BASED_OUTPATIENT_CLINIC_OR_DEPARTMENT_OTHER): Payer: Self-pay

## 2021-10-07 ENCOUNTER — Other Ambulatory Visit (HOSPITAL_COMMUNITY): Payer: Self-pay

## 2021-10-07 ENCOUNTER — Other Ambulatory Visit: Payer: Self-pay

## 2021-10-07 ENCOUNTER — Ambulatory Visit (INDEPENDENT_AMBULATORY_CARE_PROVIDER_SITE_OTHER): Payer: 59 | Admitting: Family Medicine

## 2021-10-07 ENCOUNTER — Encounter (INDEPENDENT_AMBULATORY_CARE_PROVIDER_SITE_OTHER): Payer: Self-pay | Admitting: Family Medicine

## 2021-10-07 VITALS — BP 110/77 | HR 112 | Ht 65.0 in | Wt 193.0 lb

## 2021-10-07 DIAGNOSIS — E669 Obesity, unspecified: Secondary | ICD-10-CM

## 2021-10-07 DIAGNOSIS — E559 Vitamin D deficiency, unspecified: Secondary | ICD-10-CM | POA: Diagnosis not present

## 2021-10-07 DIAGNOSIS — Z6834 Body mass index (BMI) 34.0-34.9, adult: Secondary | ICD-10-CM | POA: Diagnosis not present

## 2021-10-07 DIAGNOSIS — E1169 Type 2 diabetes mellitus with other specified complication: Secondary | ICD-10-CM

## 2021-10-07 DIAGNOSIS — Z9189 Other specified personal risk factors, not elsewhere classified: Secondary | ICD-10-CM | POA: Diagnosis not present

## 2021-10-07 DIAGNOSIS — F3289 Other specified depressive episodes: Secondary | ICD-10-CM

## 2021-10-07 MED ORDER — VITAMIN D (ERGOCALCIFEROL) 1.25 MG (50000 UNIT) PO CAPS
50000.0000 [IU] | ORAL_CAPSULE | ORAL | 0 refills | Status: DC
  Filled 2021-10-07: qty 4, 28d supply, fill #0

## 2021-10-07 MED ORDER — ESCITALOPRAM OXALATE 20 MG PO TABS
ORAL_TABLET | Freq: Every day | ORAL | 0 refills | Status: DC
  Filled 2021-10-07: qty 30, 30d supply, fill #0

## 2021-10-07 MED ORDER — SEMAGLUTIDE (1 MG/DOSE) 4 MG/3ML ~~LOC~~ SOPN
1.0000 mg | PEN_INJECTOR | SUBCUTANEOUS | 0 refills | Status: DC
  Filled 2021-10-07: qty 3, 28d supply, fill #0

## 2021-10-07 NOTE — Progress Notes (Signed)
Chief Complaint:   OBESITY Yvonne [name] is here to discuss her progress with her obesity treatment plan along with follow-up of her obesity related diagnoses. Yvonne [name] is on following a lower carbohydrate, vegetable and lean protein rich diet plan and states she is following her eating plan approximately 85% of the time. Yvonne [name] states she is walking for 30 minutes 5 times per week.  Today's visit was #: 46 Starting weight: 204 lbs Starting date: 05/17/2017 Today's weight: 193 lbs Today's date: 10/07/2021 Total lbs lost to date: 11 Total lbs lost since last in-office visit: 1  Interim History: Yvonne [name] has done well maintaining her weight loss. Her hunger is controlled and she is working on decreasing simple carbohydrates and increasing protein. She feels she is retaining a bit of fluid today.  Subjective:   1. Type 2 diabetes mellitus with other specified complication, without long-term current use of insulin ([place]) Yvonne [name] is doing well overall on Ozempic. She notes some mild queasiness on the 1 mg dose, but this is better when she takes is on a full stomach.  2. Vitamin D deficiency Yvonne [name] is on Vit D. She is due for labs and she may be at risk of over-replacement.  3. Other depression, with emotional eating Yvonne [name] is stable on Lexapro. She feels like her job and her family are stable with limited drama. No side effects were noted.  4. At risk for nausea Yvonne [name] is at risk for nausea due to Ozempic.  Assessment/Plan:   1. Type 2 diabetes mellitus with other specified complication, without long-term current use of insulin (HCC) We will check labs today, and we will refill Ozempic for 1 month. Good blood sugar control is important to decrease the likelihood of diabetic complications such as nephropathy, neuropathy, limb loss, blindness, coronary artery disease, and death. Intensive lifestyle modification including diet, exercise and weight loss are the first line of treatment for diabetes.    - Semaglutide, 1 MG/DOSE, 4 MG/3ML SOPN; Inject 1 mg as directed once a week.  Dispense: 9 mL; Refill: 0 - CMP14+EGFR - Lipid Panel With LDL/HDL Ratio - Insulin, random - Hemoglobin A1c - TSH - Vitamin B12  2. Vitamin D deficiency Low Vitamin D level contributes to fatigue and are associated with obesity, breast, and colon cancer. We will check labs today, and we will refill prescription Vitamin D for 1 month. Yvonne [name] will follow-up for routine testing of Vitamin D, at least 2-3 times per year to avoid over-replacement.  - Vitamin D, Ergocalciferol, (DRISDOL) 1.25 MG (50000 UNIT) CAPS capsule; Take 1 capsule by mouth every 7  days.  Dispense: 4 capsule; Refill: 0 - VITAMIN D 25 Hydroxy (Vit-D Deficiency, Fractures)  3. Other depression, with emotional eating Behavior modification techniques were discussed today to help Yvonne [name] deal with her emotional/non-hunger eating behaviors. We will refill Lexapro for 1 month. Orders and follow up as documented in patient record.   - escitalopram (LEXAPRO) 20 MG tablet; TAKE 1 TABLET BY MOUTH DAILY.  Dispense: 30 tablet; Refill: 0  4. At risk for nausea Yvonne [name] was given approximately 15 minutes of nausea prevention counseling today. Yvonne [name] is at risk for nausea due to her new or current medication. She was encouraged to titrate her medication slowly, make sure to stay hydrated, eat smaller portions throughout the day, and avoid high fat meals.   5. Obesity with current BMI of 32.2 Yvonne [name] is currently in the action stage of change. As such, her goal is to continue  with weight loss efforts. She has agreed to following a lower carbohydrate, vegetable and lean protein rich diet plan.   Exercise goals: As is.  Behavioral modification strategies: increasing lean protein intake and no skipping meals.  Yvonne [name] has agreed to follow-up with our clinic in 3 to 4 weeks. She was informed of the importance of frequent follow-up visits to maximize her  success with intensive lifestyle modifications for her multiple health conditions.   Yvonne [name] was informed we would discuss her lab results at her next visit unless there is a critical issue that needs to be addressed sooner. Yvonne [name] agreed to keep her next visit at the agreed upon time to discuss these results.  Objective:   Blood pressure 110/77, pulse (!) 112, height $RemoveBef'5\' 5"'vWVMgnBxoc$  (1.651 m), weight 193 lb (87.5 kg), SpO2 97 %. Body mass index is 32.12 kg/m.  General: Cooperative, alert, well developed, in no acute distress. HEENT: Conjunctivae and lids unremarkable. Cardiovascular: Regular rhythm.  Lungs: Normal work of breathing. Neurologic: No focal deficits.   Lab Results  Component Value Date   CREATININE 0.84 01/08/2021   BUN 12 01/08/2021   NA 141 01/08/2021   K 4.7 01/08/2021   CL 103 01/08/2021   CO2 21 01/08/2021   Lab Results  Component Value Date   ALT 16 01/08/2021   AST 15 01/08/2021   ALKPHOS 90 01/08/2021   BILITOT <0.2 01/08/2021   Lab Results  Component Value Date   HGBA1C 6.1 (H) 01/08/2021   HGBA1C 5.9 (H) 11/09/2019   HGBA1C 5.9 (H) 12/26/2018   HGBA1C 5.6 02/15/2018   HGBA1C 5.7 (H) 09/13/2017   Lab Results  Component Value Date   INSULIN 37.3 (H) 01/08/2021   INSULIN 33.2 (H) 11/09/2019   INSULIN 9.3 12/26/2018   INSULIN 28.8 (H) 02/15/2018   INSULIN 35.7 (H) 09/13/2017   Lab Results  Component Value Date   TSH 0.77 04/28/2019   Lab Results  Component Value Date   CHOL 190 01/08/2021   HDL 60 01/08/2021   LDLCALC 112 (H) 01/08/2021   TRIG 100 01/08/2021   CHOLHDL 3.4 04/28/2019   Lab Results  Component Value Date   VD25OH 21.8 (L) 01/08/2021   VD25OH 44.2 11/09/2019   VD25OH 35.9 12/26/2018   Lab Results  Component Value Date   WBC 11.6 (H) 07/06/2019   HGB 13.6 07/06/2019   HCT 42.1 07/06/2019   MCV 97.0 07/06/2019   PLT 306 07/06/2019   No results found for: IRON, TIBC, FERRITIN  Attestation Statements:   Reviewed by  clinician on day of visit: allergies, medications, problem list, medical history, surgical history, family history, social history, and previous encounter notes.    I, Trixie Dredge, am acting as transcriptionist for Dennard Nip, MD.  I have reviewed the above documentation for accuracy and completeness, and I agree with the above. -  Dennard Nip, MD

## 2021-10-08 LAB — CMP14+EGFR
ALT: 176 IU/L — ABNORMAL HIGH (ref 0–32)
AST: 69 IU/L — ABNORMAL HIGH (ref 0–40)
Albumin/Globulin Ratio: 1.8 (ref 1.2–2.2)
Albumin: 4.5 g/dL (ref 3.8–4.8)
Alkaline Phosphatase: 82 IU/L (ref 44–121)
BUN/Creatinine Ratio: 13 (ref 9–23)
BUN: 14 mg/dL (ref 6–24)
Bilirubin Total: 0.2 mg/dL (ref 0.0–1.2)
CO2: 22 mmol/L (ref 20–29)
Calcium: 11 mg/dL — ABNORMAL HIGH (ref 8.7–10.2)
Chloride: 104 mmol/L (ref 96–106)
Creatinine, Ser: 1.04 mg/dL — ABNORMAL HIGH (ref 0.57–1.00)
Globulin, Total: 2.5 g/dL (ref 1.5–4.5)
Glucose: 103 mg/dL — ABNORMAL HIGH (ref 70–99)
Potassium: 4.7 mmol/L (ref 3.5–5.2)
Sodium: 140 mmol/L (ref 134–144)
Total Protein: 7 g/dL (ref 6.0–8.5)
eGFR: 66 mL/min/{1.73_m2} (ref >59)

## 2021-10-08 LAB — LIPID PANEL WITH LDL/HDL RATIO
Cholesterol, Total: 234 mg/dL — ABNORMAL HIGH (ref 100–199)
HDL: 67 mg/dL (ref >39)
LDL Chol Calc (NIH): 148 mg/dL — ABNORMAL HIGH (ref 0–99)
LDL/HDL Ratio: 2.2 ratio (ref 0.0–3.2)
Triglycerides: 107 mg/dL (ref 0–149)
VLDL Cholesterol Cal: 19 mg/dL (ref 5–40)

## 2021-10-08 LAB — INSULIN, RANDOM: INSULIN: 49.9 u[IU]/mL — ABNORMAL HIGH (ref 2.6–24.9)

## 2021-10-08 LAB — VITAMIN D 25 HYDROXY (VIT D DEFICIENCY, FRACTURES): Vit D, 25-Hydroxy: 71.9 ng/mL (ref 30.0–100.0)

## 2021-10-08 LAB — TSH: TSH: 1.98 u[IU]/mL (ref 0.450–4.500)

## 2021-10-08 LAB — HEMOGLOBIN A1C
Est. average glucose Bld gHb Est-mCnc: 120 mg/dL
Hgb A1c MFr Bld: 5.8 % — ABNORMAL HIGH (ref 4.8–5.6)

## 2021-10-08 LAB — VITAMIN B12: Vitamin B-12: 317 pg/mL (ref 232–1245)

## 2021-10-13 ENCOUNTER — Other Ambulatory Visit (HOSPITAL_COMMUNITY): Payer: Self-pay

## 2021-10-20 ENCOUNTER — Other Ambulatory Visit (HOSPITAL_COMMUNITY): Payer: Self-pay

## 2021-10-24 ENCOUNTER — Other Ambulatory Visit (HOSPITAL_COMMUNITY): Payer: Self-pay

## 2021-10-24 ENCOUNTER — Other Ambulatory Visit (INDEPENDENT_AMBULATORY_CARE_PROVIDER_SITE_OTHER): Payer: Self-pay | Admitting: Family Medicine

## 2021-10-24 DIAGNOSIS — I10 Essential (primary) hypertension: Secondary | ICD-10-CM

## 2021-10-27 ENCOUNTER — Other Ambulatory Visit (HOSPITAL_COMMUNITY): Payer: Self-pay

## 2021-10-27 NOTE — Telephone Encounter (Signed)
Last OV with Dr. Beasley 

## 2021-10-27 NOTE — Telephone Encounter (Signed)
LAST APPOINTMENT DATE: 10/07/21 NEXT APPOINTMENT DATE: 11/10/21   [place] [address] Phone: [phone] Fax: [phone]  Patient is requesting a refill of the following medications: Requested Prescriptions   Pending Prescriptions Disp Refills   hydrochlorothiazide (HYDRODIURIL) 12.5 MG tablet 30 tablet 0    Sig: Take 1 tablet (12.5 mg total) by mouth daily.    Date last filled: 09/09/21 Previously prescribed by Dr Leafy Ro  Lab Results  Component Value Date   HGBA1C 5.8 (H) 10/07/2021   HGBA1C 6.1 (H) 01/08/2021   HGBA1C 5.9 (H) 11/09/2019   Lab Results  Component Value Date   LDLCALC 148 (H) 10/07/2021   CREATININE 1.04 (H) 10/07/2021   Lab Results  Component Value Date   VD25OH 71.9 10/07/2021   VD25OH 21.8 (L) 01/08/2021   VD25OH 44.2 11/09/2019    BP Readings from Last 3 Encounters:  10/07/21 110/77  09/09/21 121/81  08/19/21 112/82

## 2021-10-28 ENCOUNTER — Other Ambulatory Visit (INDEPENDENT_AMBULATORY_CARE_PROVIDER_SITE_OTHER): Payer: Self-pay | Admitting: Family Medicine

## 2021-10-28 ENCOUNTER — Other Ambulatory Visit (HOSPITAL_COMMUNITY): Payer: Self-pay

## 2021-10-28 DIAGNOSIS — I10 Essential (primary) hypertension: Secondary | ICD-10-CM

## 2021-10-29 ENCOUNTER — Other Ambulatory Visit (HOSPITAL_COMMUNITY): Payer: Self-pay

## 2021-10-29 MED ORDER — HYDROCHLOROTHIAZIDE 12.5 MG PO TABS
12.5000 mg | ORAL_TABLET | Freq: Every day | ORAL | 0 refills | Status: DC
  Filled 2021-10-29: qty 30, 30d supply, fill #0

## 2021-10-29 NOTE — Telephone Encounter (Signed)
LAST APPOINTMENT DATE: 10/07/21 NEXT APPOINTMENT DATE: 11/10/21     [place] [address] Phone: [phone] Fax: [phone]   Patient is requesting a refill of the following medications: Requested Prescriptions          Pending Prescriptions Disp Refills   hydrochlorothiazide (HYDRODIURIL) 12.5 MG tablet 30 tablet 0      Sig: Take 1 tablet (12.5 mg total) by mouth daily.      Date last filled: 09/09/21 Previously prescribed by Dr Leafy Ro   Recent Labs       Lab Results  Component Value Date    HGBA1C 5.8 (H) 10/07/2021    HGBA1C 6.1 (H) 01/08/2021    HGBA1C 5.9 (H) 11/09/2019      Recent Labs       Lab Results  Component Value Date    LDLCALC 148 (H) 10/07/2021    CREATININE 1.04 (H) 10/07/2021      Recent Labs       Lab Results  Component Value Date    VD25OH 71.9 10/07/2021    VD25OH 21.8 (L) 01/08/2021    VD25OH 44.2 11/09/2019           BP Readings from Last 3 Encounters:  10/07/21 110/77  09/09/21 121/81  08/19/21 112/82

## 2021-11-04 ENCOUNTER — Other Ambulatory Visit (HOSPITAL_COMMUNITY): Payer: Self-pay

## 2021-11-10 ENCOUNTER — Ambulatory Visit (INDEPENDENT_AMBULATORY_CARE_PROVIDER_SITE_OTHER): Payer: 59 | Admitting: Family Medicine

## 2021-11-10 ENCOUNTER — Other Ambulatory Visit (HOSPITAL_COMMUNITY): Payer: Self-pay

## 2021-11-10 ENCOUNTER — Other Ambulatory Visit (HOSPITAL_BASED_OUTPATIENT_CLINIC_OR_DEPARTMENT_OTHER): Payer: Self-pay

## 2021-11-10 ENCOUNTER — Other Ambulatory Visit: Payer: Self-pay

## 2021-11-10 ENCOUNTER — Encounter (INDEPENDENT_AMBULATORY_CARE_PROVIDER_SITE_OTHER): Payer: Self-pay | Admitting: Family Medicine

## 2021-11-10 VITALS — BP 125/84 | HR 98 | Temp 98.2°F | Ht 65.0 in | Wt 194.0 lb

## 2021-11-10 DIAGNOSIS — Z6834 Body mass index (BMI) 34.0-34.9, adult: Secondary | ICD-10-CM | POA: Diagnosis not present

## 2021-11-10 DIAGNOSIS — R7989 Other specified abnormal findings of blood chemistry: Secondary | ICD-10-CM | POA: Diagnosis not present

## 2021-11-10 DIAGNOSIS — F3289 Other specified depressive episodes: Secondary | ICD-10-CM

## 2021-11-10 DIAGNOSIS — E669 Obesity, unspecified: Secondary | ICD-10-CM | POA: Diagnosis not present

## 2021-11-10 DIAGNOSIS — E559 Vitamin D deficiency, unspecified: Secondary | ICD-10-CM

## 2021-11-10 DIAGNOSIS — Z9189 Other specified personal risk factors, not elsewhere classified: Secondary | ICD-10-CM | POA: Diagnosis not present

## 2021-11-10 DIAGNOSIS — E1169 Type 2 diabetes mellitus with other specified complication: Secondary | ICD-10-CM

## 2021-11-10 MED ORDER — ESCITALOPRAM OXALATE 20 MG PO TABS
ORAL_TABLET | Freq: Every day | ORAL | 0 refills | Status: DC
  Filled 2021-11-10: qty 30, 30d supply, fill #0

## 2021-11-10 MED ORDER — VITAMIN D (ERGOCALCIFEROL) 1.25 MG (50000 UNIT) PO CAPS
50000.0000 [IU] | ORAL_CAPSULE | ORAL | 0 refills | Status: DC
  Filled 2021-11-10: qty 4, 28d supply, fill #0

## 2021-11-10 MED ORDER — SEMAGLUTIDE (2 MG/DOSE) 8 MG/3ML ~~LOC~~ SOPN
2.0000 mg | PEN_INJECTOR | SUBCUTANEOUS | 0 refills | Status: DC
  Filled 2021-11-10: qty 9, 84d supply, fill #0
  Filled 2021-11-10: qty 3, 28d supply, fill #0

## 2021-11-10 NOTE — Progress Notes (Signed)
Chief Complaint:   OBESITY Yvonne [name] is here to discuss her progress with her obesity treatment plan along with follow-up of her obesity related diagnoses. Yvonne [name] is on following a lower carbohydrate, vegetable and lean protein rich diet plan and states she is following her eating plan approximately 80% of the time. Yvonne [name] states she is walking for 30 minutes 5 times per week.  Today's visit was #: 50 Starting weight: 204 lbs Starting date: 05/17/2017 Today's weight: 194 lbs Today's date: 11/10/2021 Total lbs lost to date: 10 Total lbs lost since last in-office visit: 0  Interim History: Yvonne [name] has done well minimizing holiday weight gain. She is working on diet and exercise and her hunger is controlled most of the time, but it has started to increase.  Subjective:   1. Type 2 diabetes mellitus with other specified complication, without long-term current use of insulin ([place]) Yvonne [name] has had episodes of feeling hot and lightheaded in the last month. She had a blood sugar of 74, and she drank orange juice and her glucose increased to >200. She notes her episodes have been in the evening.  2. Vitamin D deficiency Yvonne [name] is on Vit D, and her labs have improved and her level is now at goal.  3. Elevated LFTs Yvonne [name]'s LFTs are mildly-moderately elevated. She denies recent illness. She is not on a statin and she denies abdominal pain.  4. Other depression, with emotional eating Yvonne [name] is stable on Lexapro.   5. At risk for heart disease Yvonne [name] is at a higher than average risk for cardiovascular disease due to obesity.   Assessment/Plan:   1. Type 2 diabetes mellitus with other specified complication, without long-term current use of insulin ([place]) Yvonne [name] agreed to increase Ozempic to 2 mg pens, with a 90 day supply. Good blood sugar control is important to decrease the likelihood of diabetic complications such as nephropathy, neuropathy, limb loss, blindness, coronary artery disease, and  death. Intensive lifestyle modification including diet, exercise and weight loss are the first line of treatment for diabetes.   2. Vitamin D deficiency We will refill prescription Vitamin D 50,000 IU every week #4 for 1 month. [place] will follow-up for routine testing of Vitamin D, at least 2-3 times per year to avoid over-replacement.  3. Elevated LFTs We discussed the likely diagnosis of non-alcoholic fatty liver disease today and how this condition is obesity related. Yvonne [name] is to avoid Tylenol uses, and we will recheck labs in 2 weeks.   4. Other depression, with emotional eating Behavior modification techniques were discussed today to help Yvonne [name] deal with her emotional/non-hunger eating behaviors. We will refill Lexapro 20 mg q daily #30 for 1 month. Orders and follow up as documented in patient record.   5. At risk for heart disease Yvonne [name] was given approximately 30 minutes of coronary artery disease prevention counseling today. She is 50 y.o. female and has risk factors for heart disease including obesity. We discussed intensive lifestyle modifications today with an emphasis on specific weight loss instructions and strategies.   Repetitive spaced learning was employed today to elicit superior memory formation and behavioral change.  6. Obesity BMI today is Yvonne [name] is currently in the action stage of change. As such, her goal is to continue with weight loss efforts. She has agreed to following a lower carbohydrate, vegetable and lean protein rich diet plan.   Exercise goals: As is.  Behavioral modification strategies: increasing lean protein intake and meal planning and cooking strategies.  Yvonne [name] has agreed to follow-up with our clinic in 2 weeks. She was informed of the importance of frequent follow-up visits to maximize her success with intensive lifestyle modifications for her multiple health conditions.   Objective:   Blood pressure 125/84, pulse 98, temperature 98.2 F  (36.8 C), height 5\' 5"  (1.651 m), weight 194 lb (88 kg), SpO2 93 %. Body mass index is 32.28 kg/m.  General: Cooperative, alert, well developed, in no acute distress. HEENT: Conjunctivae and lids unremarkable. Cardiovascular: Regular rhythm.  Lungs: Normal work of breathing. Neurologic: No focal deficits.   Lab Results  Component Value Date   CREATININE 1.04 (H) 10/07/2021   BUN 14 10/07/2021   NA 140 10/07/2021   K 4.7 10/07/2021   CL 104 10/07/2021   CO2 22 10/07/2021   Lab Results  Component Value Date   ALT 176 (H) 10/07/2021   AST 69 (H) 10/07/2021   ALKPHOS 82 10/07/2021   BILITOT 0.2 10/07/2021   Lab Results  Component Value Date   HGBA1C 5.8 (H) 10/07/2021   HGBA1C 6.1 (H) 01/08/2021   HGBA1C 5.9 (H) 11/09/2019   HGBA1C 5.9 (H) 12/26/2018   HGBA1C 5.6 02/15/2018   Lab Results  Component Value Date   INSULIN 49.9 (H) 10/07/2021   INSULIN 37.3 (H) 01/08/2021   INSULIN 33.2 (H) 11/09/2019   INSULIN 9.3 12/26/2018   INSULIN 28.8 (H) 02/15/2018   Lab Results  Component Value Date   TSH 1.980 10/07/2021   Lab Results  Component Value Date   CHOL 234 (H) 10/07/2021   HDL 67 10/07/2021   LDLCALC 148 (H) 10/07/2021   TRIG 107 10/07/2021   CHOLHDL 3.4 04/28/2019   Lab Results  Component Value Date   VD25OH 71.9 10/07/2021   VD25OH 21.8 (L) 01/08/2021   VD25OH 44.2 11/09/2019   Lab Results  Component Value Date   WBC 11.6 (H) 07/06/2019   HGB 13.6 07/06/2019   HCT 42.1 07/06/2019   MCV 97.0 07/06/2019   PLT 306 07/06/2019   No results found for: IRON, TIBC, FERRITIN  Attestation Statements:   Reviewed by clinician on day of visit: allergies, medications, problem list, medical history, surgical history, family history, social history, and previous encounter notes.   I, Trixie Dredge, am acting as transcriptionist for Dennard Nip, MD.  I have reviewed the above documentation for accuracy and completeness, and I agree with the above. -  Dennard Nip, MD

## 2021-11-26 ENCOUNTER — Other Ambulatory Visit: Payer: Self-pay

## 2021-11-26 ENCOUNTER — Encounter (INDEPENDENT_AMBULATORY_CARE_PROVIDER_SITE_OTHER): Payer: Self-pay | Admitting: Family Medicine

## 2021-11-26 ENCOUNTER — Other Ambulatory Visit (HOSPITAL_COMMUNITY): Payer: Self-pay

## 2021-11-26 ENCOUNTER — Ambulatory Visit (INDEPENDENT_AMBULATORY_CARE_PROVIDER_SITE_OTHER): Payer: 59 | Admitting: Family Medicine

## 2021-11-26 VITALS — BP 119/86 | HR 97 | Temp 97.7°F | Ht 65.0 in | Wt 193.0 lb

## 2021-11-26 DIAGNOSIS — Z6834 Body mass index (BMI) 34.0-34.9, adult: Secondary | ICD-10-CM

## 2021-11-26 DIAGNOSIS — E1169 Type 2 diabetes mellitus with other specified complication: Secondary | ICD-10-CM

## 2021-11-26 DIAGNOSIS — E669 Obesity, unspecified: Secondary | ICD-10-CM

## 2021-11-26 DIAGNOSIS — F3289 Other specified depressive episodes: Secondary | ICD-10-CM

## 2021-11-26 DIAGNOSIS — Z9189 Other specified personal risk factors, not elsewhere classified: Secondary | ICD-10-CM

## 2021-11-26 DIAGNOSIS — E559 Vitamin D deficiency, unspecified: Secondary | ICD-10-CM

## 2021-11-26 DIAGNOSIS — I10 Essential (primary) hypertension: Secondary | ICD-10-CM

## 2021-11-26 MED ORDER — SEMAGLUTIDE (2 MG/DOSE) 8 MG/3ML ~~LOC~~ SOPN
2.0000 mg | PEN_INJECTOR | SUBCUTANEOUS | 0 refills | Status: DC
  Filled 2021-11-26: qty 9, 84d supply, fill #0

## 2021-11-26 MED ORDER — ESCITALOPRAM OXALATE 20 MG PO TABS
ORAL_TABLET | Freq: Every day | ORAL | 0 refills | Status: DC
  Filled 2021-11-26: qty 30, 30d supply, fill #0

## 2021-11-26 MED ORDER — VITAMIN D (ERGOCALCIFEROL) 1.25 MG (50000 UNIT) PO CAPS
50000.0000 [IU] | ORAL_CAPSULE | ORAL | 0 refills | Status: DC
  Filled 2021-11-26 – 2022-01-22 (×2): qty 4, 28d supply, fill #0

## 2021-11-26 MED ORDER — HYDROCHLOROTHIAZIDE 12.5 MG PO TABS
12.5000 mg | ORAL_TABLET | Freq: Every day | ORAL | 0 refills | Status: DC
  Filled 2021-11-26: qty 30, 30d supply, fill #0

## 2021-11-26 NOTE — Progress Notes (Signed)
Chief Complaint:   OBESITY Yvonne [name] is here to discuss her progress with her obesity treatment plan along with follow-up of her obesity related diagnoses. Yvonne [name] is on following a lower carbohydrate, vegetable and lean protein rich diet plan and states she is following her eating plan approximately 80% of the time. Yvonne [name] states she is walking for 30-45 minutes 5 times per week.  Today's visit was #: 88 Starting weight: 204 lbs Starting date: 05/17/2017 Today's weight: 193 lbs Today's date: 11/26/2021 Total lbs lost to date: 11 Total lbs lost since last in-office visit: 1  Interim History: Yvonne [name] has done well with weight loss despite extra challenges in her life. She is doing well with decreasing simple carbohydrates and trying to portion control.  Subjective:   1. Essential hypertension Yvonne [name]'s blood pressure is well controlled on hydrochlorothiazide, and she is due for labs.  2. Type 2 diabetes mellitus with other specified complication, without long-term current use of insulin ([place]) Yvonne [name] is working on diet and exercise. She is stable on Ozempic at 1.5 mg dose. She si due to have labs.  3. Vitamin D deficiency Yvonne [name] is on Vit D, and she is due for labs.  4. Other depression, with emotional eating Yvonne [name] has done well with dealing with her emotional eating behaviors, even with extra work and family stressors. She feels her Lexapro has helped.  5. At risk for heart disease Yvonne [name] is at a higher than average risk for cardiovascular disease due to obesity.   Assessment/Plan:   1. Essential hypertension We will check labs today, and we will refill hydrochlorothiazide for 1 month. Yvonne [name] will continue working on healthy weight loss and exercise to improve blood pressure control. We will watch for signs of hypotension as she continues her lifestyle modifications.  - hydrochlorothiazide (HYDRODIURIL) 12.5 MG tablet; Take 1 tablet (12.5 mg total) by mouth daily.  Dispense: 30  tablet; Refill: 0  2. Type 2 diabetes mellitus with other specified complication, without long-term current use of insulin ([facility]) We will check labs today. Yvonne [name] will continue Ozempic, and we will refill for 1 month. Good blood sugar control is important to decrease the likelihood of diabetic complications such as nephropathy, neuropathy, limb loss, blindness, coronary artery disease, and death. Intensive lifestyle modification including diet, exercise and weight loss are the first line of treatment for diabetes.   - Semaglutide, 2 MG/DOSE, 8 MG/3ML SOPN; Inject 2 mg as directed once a week.  Dispense: 9 mL; Refill: 0 - CMP14+EGFR - Lipid Panel With LDL/HDL Ratio - Insulin, random - Hemoglobin A1c  3. Vitamin D deficiency We will check labs today, and we will refill prescription Vitamin D for 1 month. Yvonne [name] will follow-up for routine testing of Vitamin D, at least 2-3 times per year to avoid over-replacement.  - Vitamin D, Ergocalciferol, (DRISDOL) 1.25 MG (50000 UNIT) CAPS capsule; Take 1 capsule by mouth every 7  days.  Dispense: 4 capsule; Refill: 0 - VITAMIN D 25 Hydroxy (Vit-D Deficiency, Fractures)  4. Other depression, with emotional eating Behavior modification techniques were discussed today to help [name] deal with her emotional/non-hunger eating behaviors. We will refill Lexapro for 1 month. Orders and follow up as documented in patient record.   - escitalopram (LEXAPRO) 20 MG tablet; TAKE 1 TABLET BY MOUTH DAILY.  Dispense: 30 tablet; Refill: 0  5. At risk for heart disease Yvonne [name] was given approximately 15 minutes of coronary artery disease prevention counseling today. She is 50 y.o. female and has  risk factors for heart disease including obesity. We discussed intensive lifestyle modifications today with an emphasis on specific weight loss instructions and strategies.   Repetitive spaced learning was employed today to elicit superior memory formation and behavioral  change.  6. Obesity BMI today is Yvonne [name] is currently in the action stage of change. As such, her goal is to continue with weight loss efforts. She has agreed to following a lower carbohydrate, vegetable and lean protein rich diet plan.   Exercise goals: As is.  Behavioral modification strategies: increasing lean protein intake.  Yvonne [name] has agreed to follow-up with our clinic in 2 to 3 weeks. She was informed of the importance of frequent follow-up visits to maximize her success with intensive lifestyle modifications for her multiple health conditions.   Yvonne [name] was informed we would discuss her lab results at her next visit unless there is a critical issue that needs to be addressed sooner. Yvonne [name] agreed to keep her next visit at the agreed upon time to discuss these results.  Objective:   Blood pressure 119/86, pulse 97, temperature 97.7 F (36.5 C), height $RemoveBe'5\' 5"'bONnkLdLv$  (1.651 m), weight 193 lb (87.5 kg), SpO2 97 %. Body mass index is 32.12 kg/m.  General: Cooperative, alert, well developed, in no acute distress. HEENT: Conjunctivae and lids unremarkable. Cardiovascular: Regular rhythm.  Lungs: Normal work of breathing. Neurologic: No focal deficits.   Lab Results  Component Value Date   CREATININE 1.04 (H) 10/07/2021   BUN 14 10/07/2021   NA 140 10/07/2021   K 4.7 10/07/2021   CL 104 10/07/2021   CO2 22 10/07/2021   Lab Results  Component Value Date   ALT 176 (H) 10/07/2021   AST 69 (H) 10/07/2021   ALKPHOS 82 10/07/2021   BILITOT 0.2 10/07/2021   Lab Results  Component Value Date   HGBA1C 5.8 (H) 10/07/2021   HGBA1C 6.1 (H) 01/08/2021   HGBA1C 5.9 (H) 11/09/2019   HGBA1C 5.9 (H) 12/26/2018   HGBA1C 5.6 02/15/2018   Lab Results  Component Value Date   INSULIN 49.9 (H) 10/07/2021   INSULIN 37.3 (H) 01/08/2021   INSULIN 33.2 (H) 11/09/2019   INSULIN 9.3 12/26/2018   INSULIN 28.8 (H) 02/15/2018   Lab Results  Component Value Date   TSH 1.980 10/07/2021   Lab  Results  Component Value Date   CHOL 234 (H) 10/07/2021   HDL 67 10/07/2021   LDLCALC 148 (H) 10/07/2021   TRIG 107 10/07/2021   CHOLHDL 3.4 04/28/2019   Lab Results  Component Value Date   VD25OH 71.9 10/07/2021   VD25OH 21.8 (L) 01/08/2021   VD25OH 44.2 11/09/2019   Lab Results  Component Value Date   WBC 11.6 (H) 07/06/2019   HGB 13.6 07/06/2019   HCT 42.1 07/06/2019   MCV 97.0 07/06/2019   PLT 306 07/06/2019   No results found for: IRON, TIBC, FERRITIN  Attestation Statements:   Reviewed by clinician on day of visit: allergies, medications, problem list, medical history, surgical history, family history, social history, and previous encounter notes.   I, Trixie Dredge, am acting as transcriptionist for Dennard Nip, MD.  I have reviewed the above documentation for accuracy and completeness, and I agree with the above. -  Dennard Nip, MD

## 2021-11-27 LAB — INSULIN, RANDOM: INSULIN: 55.8 u[IU]/mL — ABNORMAL HIGH (ref 2.6–24.9)

## 2021-11-27 LAB — CMP14+EGFR
ALT: 138 IU/L — ABNORMAL HIGH (ref 0–32)
AST: 57 IU/L — ABNORMAL HIGH (ref 0–40)
Albumin/Globulin Ratio: 1.8 (ref 1.2–2.2)
Albumin: 4.2 g/dL (ref 3.8–4.8)
Alkaline Phosphatase: 78 IU/L (ref 44–121)
BUN/Creatinine Ratio: 14 (ref 9–23)
BUN: 14 mg/dL (ref 6–24)
Bilirubin Total: 0.3 mg/dL (ref 0.0–1.2)
CO2: 19 mmol/L — ABNORMAL LOW (ref 20–29)
Calcium: 10 mg/dL (ref 8.7–10.2)
Chloride: 106 mmol/L (ref 96–106)
Creatinine, Ser: 0.98 mg/dL (ref 0.57–1.00)
Globulin, Total: 2.4 g/dL (ref 1.5–4.5)
Glucose: 93 mg/dL (ref 70–99)
Potassium: 4.1 mmol/L (ref 3.5–5.2)
Sodium: 140 mmol/L (ref 134–144)
Total Protein: 6.6 g/dL (ref 6.0–8.5)
eGFR: 71 mL/min/{1.73_m2} (ref >59)

## 2021-11-27 LAB — LIPID PANEL WITH LDL/HDL RATIO
Cholesterol, Total: 206 mg/dL — ABNORMAL HIGH (ref 100–199)
HDL: 65 mg/dL (ref >39)
LDL Chol Calc (NIH): 122 mg/dL — ABNORMAL HIGH (ref 0–99)
LDL/HDL Ratio: 1.9 ratio (ref 0.0–3.2)
Triglycerides: 106 mg/dL (ref 0–149)
VLDL Cholesterol Cal: 19 mg/dL (ref 5–40)

## 2021-11-27 LAB — VITAMIN D 25 HYDROXY (VIT D DEFICIENCY, FRACTURES): Vit D, 25-Hydroxy: 56 ng/mL (ref 30.0–100.0)

## 2021-11-27 LAB — HEMOGLOBIN A1C
Est. average glucose Bld gHb Est-mCnc: 117 mg/dL
Hgb A1c MFr Bld: 5.7 % — ABNORMAL HIGH (ref 4.8–5.6)

## 2021-12-01 ENCOUNTER — Other Ambulatory Visit: Payer: Self-pay

## 2021-12-01 ENCOUNTER — Emergency Department (HOSPITAL_COMMUNITY): Payer: 59

## 2021-12-01 ENCOUNTER — Inpatient Hospital Stay (HOSPITAL_COMMUNITY)
Admission: EM | Admit: 2021-12-01 | Discharge: 2021-12-07 | DRG: 742 | Disposition: A | Discharge status: Home / Self Care | Payer: 59 | Attending: Gynecologic Oncology | Admitting: Gynecologic Oncology

## 2021-12-01 ENCOUNTER — Encounter (HOSPITAL_COMMUNITY): Payer: Self-pay | Admitting: Emergency Medicine

## 2021-12-01 DIAGNOSIS — E785 Hyperlipidemia, unspecified: Secondary | ICD-10-CM | POA: Diagnosis present

## 2021-12-01 DIAGNOSIS — K56699 Other intestinal obstruction unspecified as to partial versus complete obstruction: Secondary | ICD-10-CM | POA: Diagnosis not present

## 2021-12-01 DIAGNOSIS — U071 COVID-19: Secondary | ICD-10-CM | POA: Diagnosis present

## 2021-12-01 DIAGNOSIS — K9189 Other postprocedural complications and disorders of digestive system: Secondary | ICD-10-CM | POA: Diagnosis not present

## 2021-12-01 DIAGNOSIS — K56609 Unspecified intestinal obstruction, unspecified as to partial versus complete obstruction: Secondary | ICD-10-CM | POA: Diagnosis not present

## 2021-12-01 DIAGNOSIS — K567 Ileus, unspecified: Secondary | ICD-10-CM | POA: Diagnosis not present

## 2021-12-01 DIAGNOSIS — D72829 Elevated white blood cell count, unspecified: Secondary | ICD-10-CM | POA: Diagnosis present

## 2021-12-01 DIAGNOSIS — I10 Essential (primary) hypertension: Secondary | ICD-10-CM | POA: Diagnosis present

## 2021-12-01 DIAGNOSIS — M1990 Unspecified osteoarthritis, unspecified site: Secondary | ICD-10-CM | POA: Diagnosis present

## 2021-12-01 DIAGNOSIS — Z0189 Encounter for other specified special examinations: Secondary | ICD-10-CM

## 2021-12-01 DIAGNOSIS — N83521 Torsion of right fallopian tube: Secondary | ICD-10-CM | POA: Diagnosis not present

## 2021-12-01 DIAGNOSIS — E669 Obesity, unspecified: Secondary | ICD-10-CM | POA: Diagnosis present

## 2021-12-01 DIAGNOSIS — D259 Leiomyoma of uterus, unspecified: Secondary | ICD-10-CM | POA: Diagnosis not present

## 2021-12-01 DIAGNOSIS — I96 Gangrene, not elsewhere classified: Secondary | ICD-10-CM | POA: Diagnosis present

## 2021-12-01 DIAGNOSIS — F3289 Other specified depressive episodes: Secondary | ICD-10-CM | POA: Diagnosis not present

## 2021-12-01 DIAGNOSIS — R109 Unspecified abdominal pain: Secondary | ICD-10-CM | POA: Diagnosis not present

## 2021-12-01 DIAGNOSIS — E876 Hypokalemia: Secondary | ICD-10-CM | POA: Diagnosis not present

## 2021-12-01 DIAGNOSIS — K219 Gastro-esophageal reflux disease without esophagitis: Secondary | ICD-10-CM

## 2021-12-01 DIAGNOSIS — E861 Hypovolemia: Secondary | ICD-10-CM | POA: Diagnosis not present

## 2021-12-01 DIAGNOSIS — I959 Hypotension, unspecified: Secondary | ICD-10-CM | POA: Diagnosis not present

## 2021-12-01 DIAGNOSIS — Z807 Family history of other malignant neoplasms of lymphoid, hematopoietic and related tissues: Secondary | ICD-10-CM

## 2021-12-01 DIAGNOSIS — N7092 Oophoritis, unspecified: Secondary | ICD-10-CM | POA: Diagnosis not present

## 2021-12-01 DIAGNOSIS — Z6834 Body mass index (BMI) 34.0-34.9, adult: Secondary | ICD-10-CM

## 2021-12-01 DIAGNOSIS — N924 Excessive bleeding in the premenopausal period: Secondary | ICD-10-CM | POA: Diagnosis not present

## 2021-12-01 DIAGNOSIS — Z4682 Encounter for fitting and adjustment of non-vascular catheter: Secondary | ICD-10-CM | POA: Diagnosis not present

## 2021-12-01 DIAGNOSIS — I878 Other specified disorders of veins: Secondary | ICD-10-CM | POA: Diagnosis not present

## 2021-12-01 DIAGNOSIS — Z79899 Other long term (current) drug therapy: Secondary | ICD-10-CM

## 2021-12-01 DIAGNOSIS — N179 Acute kidney failure, unspecified: Secondary | ICD-10-CM | POA: Diagnosis not present

## 2021-12-01 DIAGNOSIS — E871 Hypo-osmolality and hyponatremia: Secondary | ICD-10-CM | POA: Diagnosis not present

## 2021-12-01 DIAGNOSIS — N920 Excessive and frequent menstruation with regular cycle: Secondary | ICD-10-CM | POA: Diagnosis present

## 2021-12-01 DIAGNOSIS — N83519 Torsion of ovary and ovarian pedicle, unspecified side: Secondary | ICD-10-CM | POA: Diagnosis not present

## 2021-12-01 DIAGNOSIS — E860 Dehydration: Secondary | ICD-10-CM | POA: Diagnosis present

## 2021-12-01 DIAGNOSIS — N83511 Torsion of right ovary and ovarian pedicle: Secondary | ICD-10-CM | POA: Diagnosis not present

## 2021-12-01 DIAGNOSIS — E872 Acidosis, unspecified: Secondary | ICD-10-CM | POA: Diagnosis not present

## 2021-12-01 DIAGNOSIS — R1030 Lower abdominal pain, unspecified: Secondary | ICD-10-CM | POA: Diagnosis not present

## 2021-12-01 DIAGNOSIS — K661 Hemoperitoneum: Secondary | ICD-10-CM | POA: Diagnosis not present

## 2021-12-01 DIAGNOSIS — R102 Pelvic and perineal pain: Secondary | ICD-10-CM | POA: Diagnosis not present

## 2021-12-01 DIAGNOSIS — F32A Depression, unspecified: Secondary | ICD-10-CM | POA: Diagnosis present

## 2021-12-01 DIAGNOSIS — A419 Sepsis, unspecified organism: Secondary | ICD-10-CM | POA: Diagnosis not present

## 2021-12-01 DIAGNOSIS — R110 Nausea: Secondary | ICD-10-CM | POA: Diagnosis not present

## 2021-12-01 DIAGNOSIS — Z833 Family history of diabetes mellitus: Secondary | ICD-10-CM | POA: Diagnosis not present

## 2021-12-01 DIAGNOSIS — D270 Benign neoplasm of right ovary: Secondary | ICD-10-CM | POA: Diagnosis present

## 2021-12-01 DIAGNOSIS — N838 Other noninflammatory disorders of ovary, fallopian tube and broad ligament: Secondary | ICD-10-CM | POA: Diagnosis present

## 2021-12-01 DIAGNOSIS — K6389 Other specified diseases of intestine: Secondary | ICD-10-CM | POA: Diagnosis not present

## 2021-12-01 DIAGNOSIS — Z8249 Family history of ischemic heart disease and other diseases of the circulatory system: Secondary | ICD-10-CM

## 2021-12-01 DIAGNOSIS — D271 Benign neoplasm of left ovary: Secondary | ICD-10-CM | POA: Diagnosis not present

## 2021-12-01 DIAGNOSIS — R1900 Intra-abdominal and pelvic swelling, mass and lump, unspecified site: Secondary | ICD-10-CM | POA: Diagnosis present

## 2021-12-01 DIAGNOSIS — K5939 Other megacolon: Secondary | ICD-10-CM | POA: Diagnosis not present

## 2021-12-01 DIAGNOSIS — R1084 Generalized abdominal pain: Secondary | ICD-10-CM | POA: Diagnosis not present

## 2021-12-01 LAB — CBC WITH DIFFERENTIAL/PLATELET
Abs Immature Granulocytes: 0.13 10*3/uL — ABNORMAL HIGH (ref 0.00–0.07)
Basophils Absolute: 0.1 10*3/uL (ref 0.0–0.1)
Basophils Relative: 0 %
Eosinophils Absolute: 0 10*3/uL (ref 0.0–0.5)
Eosinophils Relative: 0 %
HCT: 43.1 % (ref 36.0–46.0)
Hemoglobin: 14.4 g/dL (ref 12.0–15.0)
Immature Granulocytes: 1 %
Lymphocytes Relative: 7 %
Lymphs Abs: 1.4 10*3/uL (ref 0.7–4.0)
MCH: 31.7 pg (ref 26.0–34.0)
MCHC: 33.4 g/dL (ref 30.0–36.0)
MCV: 94.9 fL (ref 80.0–100.0)
Monocytes Absolute: 1.4 10*3/uL — ABNORMAL HIGH (ref 0.1–1.0)
Monocytes Relative: 7 %
Neutro Abs: 16.9 10*3/uL — ABNORMAL HIGH (ref 1.7–7.7)
Neutrophils Relative %: 85 %
Platelets: 283 10*3/uL (ref 150–400)
RBC: 4.54 MIL/uL (ref 3.87–5.11)
RDW: 14.4 % (ref 11.5–15.5)
WBC: 19.9 10*3/uL — ABNORMAL HIGH (ref 4.0–10.5)
nRBC: 0 % (ref 0.0–0.2)

## 2021-12-01 LAB — COMPREHENSIVE METABOLIC PANEL
ALT: 71 U/L — ABNORMAL HIGH (ref 0–44)
AST: 25 U/L (ref 15–41)
Albumin: 3.9 g/dL (ref 3.5–5.0)
Alkaline Phosphatase: 61 U/L (ref 38–126)
Anion gap: 9 (ref 5–15)
BUN: 11 mg/dL (ref 6–20)
CO2: 21 mmol/L — ABNORMAL LOW (ref 22–32)
Calcium: 9.9 mg/dL (ref 8.9–10.3)
Chloride: 105 mmol/L (ref 98–111)
Creatinine, Ser: 0.84 mg/dL (ref 0.44–1.00)
GFR, Estimated: >60 mL/min (ref >60)
Glucose, Bld: 125 mg/dL — ABNORMAL HIGH (ref 70–99)
Potassium: 3.9 mmol/L (ref 3.5–5.1)
Sodium: 135 mmol/L (ref 135–145)
Total Bilirubin: 0.9 mg/dL (ref 0.3–1.2)
Total Protein: 7.4 g/dL (ref 6.5–8.1)

## 2021-12-01 LAB — I-STAT BETA HCG BLOOD, ED (MC, WL, AP ONLY): I-stat hCG, quantitative: <5 m[IU]/mL (ref <5)

## 2021-12-01 LAB — LIPASE, BLOOD: Lipase: 33 U/L (ref 11–51)

## 2021-12-01 LAB — RESP PANEL BY RT-PCR (FLU A&B, COVID) ARPGX2
Influenza A by PCR: NEGATIVE
Influenza B by PCR: NEGATIVE
SARS Coronavirus 2 by RT PCR: POSITIVE — AB

## 2021-12-01 LAB — LACTIC ACID, PLASMA: Lactic Acid, Venous: 1.5 mmol/L (ref 0.5–1.9)

## 2021-12-01 MED ORDER — MORPHINE SULFATE (PF) 4 MG/ML IV SOLN
4.0000 mg | Freq: Once | INTRAVENOUS | Status: AC
  Administered 2021-12-01: 13:00:00 4 mg via INTRAVENOUS
  Filled 2021-12-01: qty 1

## 2021-12-01 MED ORDER — HYDROMORPHONE HCL 1 MG/ML IJ SOLN
1.0000 mg | INTRAMUSCULAR | Status: AC | PRN
  Administered 2021-12-01 – 2021-12-02 (×5): 1 mg via INTRAVENOUS
  Filled 2021-12-01 (×5): qty 1

## 2021-12-01 MED ORDER — HYDROMORPHONE HCL 1 MG/ML IJ SOLN
1.0000 mg | Freq: Once | INTRAMUSCULAR | Status: AC
  Administered 2021-12-01: 18:00:00 1 mg via INTRAVENOUS
  Filled 2021-12-01: qty 1

## 2021-12-01 MED ORDER — NIRMATRELVIR/RITONAVIR (PAXLOVID)TABLET
3.0000 | ORAL_TABLET | Freq: Two times a day (BID) | ORAL | Status: AC
  Administered 2021-12-01 – 2021-12-03 (×3): 3 via ORAL
  Filled 2021-12-01: qty 30

## 2021-12-01 MED ORDER — FENTANYL CITRATE PF 50 MCG/ML IJ SOSY
100.0000 ug | PREFILLED_SYRINGE | Freq: Once | INTRAMUSCULAR | Status: AC
  Administered 2021-12-01: 16:00:00 100 ug via INTRAVENOUS
  Filled 2021-12-01: qty 2

## 2021-12-01 MED ORDER — NORETHINDRONE ACET-ETHINYL EST 1-20 MG-MCG PO TABS: 1.0000 | ORAL_TABLET | Freq: Every day | ORAL | Status: DC

## 2021-12-01 MED ORDER — HYDROCHLOROTHIAZIDE 12.5 MG PO TABS: 12.5000 mg | ORAL_TABLET | Freq: Every day | ORAL | Status: DC

## 2021-12-01 MED ORDER — ONDANSETRON HCL 4 MG/2ML IJ SOLN
4.0000 mg | Freq: Once | INTRAMUSCULAR | Status: AC
  Administered 2021-12-01: 18:00:00 4 mg via INTRAVENOUS
  Filled 2021-12-01: qty 2

## 2021-12-01 MED ORDER — IOHEXOL 350 MG/ML SOLN
100.0000 mL | Freq: Once | INTRAVENOUS | Status: AC | PRN
  Administered 2021-12-01: 16:00:00 100 mL via INTRAVENOUS

## 2021-12-01 MED ORDER — OXYCODONE-ACETAMINOPHEN 5-325 MG PO TABS
1.0000 | ORAL_TABLET | Freq: Four times a day (QID) | ORAL | Status: DC
  Administered 2021-12-01 – 2021-12-02 (×4): 1 via ORAL
  Filled 2021-12-01 (×4): qty 1

## 2021-12-01 MED ORDER — HYDROMORPHONE HCL 1 MG/ML IJ SOLN
1.0000 mg | Freq: Once | INTRAMUSCULAR | Status: AC
  Administered 2021-12-01: 20:00:00 1 mg via INTRAVENOUS
  Filled 2021-12-01: qty 1

## 2021-12-01 MED ORDER — ENOXAPARIN SODIUM 40 MG/0.4ML IJ SOSY
40.0000 mg | PREFILLED_SYRINGE | INTRAMUSCULAR | Status: DC
  Administered 2021-12-01 – 2021-12-06 (×5): 40 mg via SUBCUTANEOUS
  Filled 2021-12-01 (×5): qty 0.4

## 2021-12-01 MED ORDER — ESCITALOPRAM OXALATE 20 MG PO TABS
20.0000 mg | ORAL_TABLET | Freq: Every day | ORAL | Status: DC
  Administered 2021-12-02: 11:00:00 20 mg via ORAL
  Filled 2021-12-01: qty 1

## 2021-12-01 MED ORDER — HYDROMORPHONE HCL 1 MG/ML IJ SOLN
1.0000 mg | Freq: Once | INTRAMUSCULAR | Status: AC
  Administered 2021-12-01: 14:00:00 1 mg via INTRAVENOUS
  Filled 2021-12-01: qty 1

## 2021-12-01 NOTE — ED Notes (Signed)
ED TO INPATIENT HANDOFF REPORT  ED Nurse Name and Phone #: Erick Colace, RN [phone]  S Name/Age/Gender Yvonne [name] 50 y.o. female Room/Bed: WA01/WA01  Code Status   Code Status: Full Code  Home/SNF/Other Home Patient oriented to: self, place, time, and situation Is this baseline? Yes   Triage Complete: Triage complete  Chief Complaint Intractable abdominal pain [R10.9]  Triage Note BIBA Per EMS: Pt coming from home w/ c/o lower abd pain. Progressively worse since last night. No distention, no vomiting. Feels nauseous. Clammy in triage.   Vitals:  128/70 98HR  18RR 98% RA 130 CBG  98.4 temp     Allergies No Known Allergies  Level of Care/Admitting Diagnosis ED Disposition     ED Disposition  Admit   Condition  --   Comment  Hospital Area: [place] [[id number]]  Level of Care: Telemetry [5]  Admit to tele based on following criteria: Other see comments  Comments: rate  May admit patient to [facility] or [facility] if equivalent level of care is available:: No  Covid Evaluation: Confirmed COVID Positive  Diagnosis: Intractable abdominal pain [063016]  Admitting Physician: Orene Desanctis [[id number]]  Attending Physician: Orene Desanctis [[id number]]  Estimated length of stay: past midnight tomorrow  Certification:: I certify this patient will need inpatient services for at least 2 midnights          B Medical/Surgery History Past Medical History:  Diagnosis Date   Anemia    Arthritis    High blood pressure    Joint pain    Kidney stones    Microscopic hematuria    Vitamin D deficiency    Past Surgical History:  Procedure Laterality Date   NO PAST SURGERIES     Denies surgical history     A IV Location/Drains/Wounds Patient Lines/Drains/Airways Status     Active Line/Drains/Airways     Name Placement date Placement time Site Days   Peripheral IV 12/01/21 20 G 1" Right Antecubital 12/01/21  1125  Antecubital  less than 1             Intake/Output Last 24 hours No intake or output data in the 24 hours ending 12/01/21 2346  Labs/Imaging Results for orders placed or performed during the hospital encounter of 12/01/21 (from the past 48 hour(s))  CBC with Differential     Status: Abnormal   Collection Time: 12/01/21 12:43 PM  Result Value Ref Range   WBC 19.9 (H) 4.0 - 10.5 K/uL   RBC 4.54 3.87 - 5.11 MIL/uL   Hemoglobin 14.4 12.0 - 15.0 g/dL   HCT 43.1 36.0 - 46.0 %   MCV 94.9 80.0 - 100.0 fL   MCH 31.7 26.0 - 34.0 pg   MCHC 33.4 30.0 - 36.0 g/dL   RDW 14.4 11.5 - 15.5 %   Platelets 283 150 - 400 K/uL   nRBC 0.0 0.0 - 0.2 %   Neutrophils Relative % 85 %   Neutro Abs 16.9 (H) 1.7 - 7.7 K/uL   Lymphocytes Relative 7 %   Lymphs Abs 1.4 0.7 - 4.0 K/uL   Monocytes Relative 7 %   Monocytes Absolute 1.4 (H) 0.1 - 1.0 K/uL   Eosinophils Relative 0 %   Eosinophils Absolute 0.0 0.0 - 0.5 K/uL   Basophils Relative 0 %   Basophils Absolute 0.1 0.0 - 0.1 K/uL   Immature Granulocytes 1 %   Abs Immature Granulocytes 0.13 (H) 0.00 - 0.07 K/uL  Comment: Performed at [facility], [place] [address] [place], [place] [address]  Comprehensive metabolic panel     Status: Abnormal   Collection Time: 12/01/21 12:43 PM  Result Value Ref Range   Sodium 135 135 - 145 mmol/L   Potassium 3.9 3.5 - 5.1 mmol/L   Chloride 105 98 - 111 mmol/L   CO2 21 (L) 22 - 32 mmol/L   Glucose, Bld 125 (H) 70 - 99 mg/dL    Comment: Glucose reference range applies only to samples taken after fasting for at least 8 hours.   BUN 11 6 - 20 mg/dL   Creatinine, Ser 0.84 0.44 - 1.00 mg/dL   Calcium 9.9 8.9 - 10.3 mg/dL   Total Protein 7.4 6.5 - 8.1 g/dL   Albumin 3.9 3.5 - 5.0 g/dL   AST 25 15 - 41 U/L   ALT 71 (H) 0 - 44 U/L   Alkaline Phosphatase 61 38 - 126 U/L   Total Bilirubin 0.9 0.3 - 1.2 mg/dL   GFR, Estimated >60 >60 mL/min    Comment: (NOTE) Calculated using the CKD-EPI Creatinine Equation (2021)     Anion gap 9 5 - 15    Comment: Performed at [facility], [place] [address] [place], [place] [address]  Lipase, blood     Status: None   Collection Time: 12/01/21 12:43 PM  Result Value Ref Range   Lipase 33 11 - 51 U/L    Comment: Performed at [facility], [place] [address] [place], [place] [address]  Lactic acid, plasma     Status: None   Collection Time: 12/01/21 12:43 PM  Result Value Ref Range   Lactic Acid, Venous 1.5 0.5 - 1.9 mmol/L    Comment: Performed at [facility], [place] [address], [place], [place] [address]  I-Stat beta hCG blood, ED     Status: None   Collection Time: 12/01/21 12:52 PM  Result Value Ref Range   I-stat hCG, quantitative <5.0 <5 mIU/mL   Comment 3            Comment:   GEST. AGE      CONC.  (mIU/mL)   <=1 WEEK        5 - 50     2 WEEKS       50 - 500     3 WEEKS       100 - 10,000     4 WEEKS     1,000 - 30,000        FEMALE AND NON-PREGNANT FEMALE:     LESS THAN 5 mIU/mL   Resp Panel by RT-PCR (Flu A&B, Covid) Nasopharyngeal Swab     Status: Abnormal   Collection Time: 12/01/21  5:25 PM   Specimen: Nasopharyngeal Swab; Nasopharyngeal(NP) swabs in vial transport medium  Result Value Ref Range   SARS Coronavirus 2 by RT PCR POSITIVE (A) NEGATIVE    Comment: (NOTE) SARS-CoV-2 target nucleic acids are DETECTED.  The SARS-CoV-2 RNA is generally detectable in upper respiratory specimens during the acute phase of infection. Positive results are indicative of the presence of the identified virus, but do not rule out bacterial infection or co-infection with other pathogens not detected by the test. Clinical correlation with patient history and other diagnostic information is necessary to determine patient infection status. The expected result is Negative.  Fact Sheet for Patients: EntrepreneurPulse.com.au  Fact Sheet for Healthcare  Providers: IncredibleEmployment.be  This test is not  yet approved or cleared by the [place] and  has been authorized for detection and/or diagnosis of SARS-CoV-2 by FDA under an Emergency Use Authorization (EUA).  This EUA will remain in effect (meaning this test can be used) for the duration of  the COVID-19 declaration under Section 564(b)(1) of the A ct, 21 U.S.C. section 360bbb-3(b)(1), unless the authorization is terminated or revoked sooner.     Influenza A by PCR NEGATIVE NEGATIVE   Influenza B by PCR NEGATIVE NEGATIVE    Comment: (NOTE) The Xpert Xpress SARS-CoV-2/FLU/RSV plus assay is intended as an aid in the diagnosis of influenza from Nasopharyngeal swab specimens and should not be used as a sole basis for treatment. Nasal washings and aspirates are unacceptable for Xpert Xpress SARS-CoV-2/FLU/RSV testing.  Fact Sheet for Patients: EntrepreneurPulse.com.au  Fact Sheet for Healthcare Providers: IncredibleEmployment.be  This test is not yet approved or cleared by the [place] FDA and has been authorized for detection and/or diagnosis of SARS-CoV-2 by FDA under an Emergency Use Authorization (EUA). This EUA will remain in effect (meaning this test can be used) for the duration of the COVID-19 declaration under Section 564(b)(1) of the Act, 21 U.S.C. section 360bbb-3(b)(1), unless the authorization is terminated or revoked.  Performed at [facility], [place] [address] [place], [place] [address]    US Pelvis Complete  Result Date: 12/01/2021 CLINICAL DATA:  Pelvic pain with ovarian mass EXAM: TRANSABDOMINAL ULTRASOUND OF PELVIS DOPPLER ULTRASOUND OF OVARIES TECHNIQUE: Transabdominal ultrasound examination of the pelvis was performed including evaluation of the uterus, ovaries, adnexal regions, and pelvic cul-de-sac. Color and duplex Doppler ultrasound was utilized to evaluate blood  flow to the ovaries. COMPARISON:  CT 12/01/2021 FINDINGS: Uterus Measurements: 13 x 5.6 x 7.1 cm = volume: 270.9 mL. Possible small fibroid within the subserosal anterior uterine fundus measuring 7 mm. Endometrium Thickness: 3.5 mm.  No focal abnormality visualized. Right ovary Measurements: 2.3 x 1.7 x 1.8 cm = volume: 3.7 mL. Solid adnexal mass adjacent to or a exophytic to right ovary measuring 5.5 x 4.3 x 4.8 cm. Left ovary Measurements: 2.2 x 1.5 x 2.4 cm = volume: 4.5 mL. Normal appearance/no adnexal mass. Pulsed Doppler evaluation demonstrates normal low-resistance arterial and venous waveforms in both ovaries. Other: Small free fluid. Large cystic and solid left mid abdominal mass measuring 15.9 x 8.8 x 11.6 cm, corresponding to the large left abdominal mass on CT visualized at the level of umbilicus. IMPRESSION: 1. Solid 5.5 cm right adnexal mass adjacent to or exophytic from right ovary. On CT, this appears contiguous with a large cystic and solid mass within the left mid abdomen; on ultrasound this mass measures up to 15.9 cm. Findings are suspicious for adnexal or ovarian neoplasm. Surgical consultation is recommended. A small amount of normal right ovarian tissue is imaged and does not demonstrate evidence for torsion. Negative for left ovarian torsion. 2. Small uterine fibroid. Electronically Signed   By: Donavan Foil M.D.   On: 12/01/2021 20:21   [place] Art/Ven Flow Abd Pelv Doppler  Result Date: 12/01/2021 CLINICAL DATA:  Pelvic pain with ovarian mass EXAM: TRANSABDOMINAL ULTRASOUND OF PELVIS DOPPLER ULTRASOUND OF OVARIES TECHNIQUE: Transabdominal ultrasound examination of the pelvis was performed including evaluation of the uterus, ovaries, adnexal regions, and pelvic cul-de-sac. Color and duplex Doppler ultrasound was utilized to evaluate blood flow to the ovaries. COMPARISON:  CT 12/01/2021 FINDINGS: Uterus Measurements: 13 x 5.6 x 7.1 cm = volume: 270.9 mL. Possible small fibroid within  the  subserosal anterior uterine fundus measuring 7 mm. Endometrium Thickness: 3.5 mm.  No focal abnormality visualized. Right ovary Measurements: 2.3 x 1.7 x 1.8 cm = volume: 3.7 mL. Solid adnexal mass adjacent to or a exophytic to right ovary measuring 5.5 x 4.3 x 4.8 cm. Left ovary Measurements: 2.2 x 1.5 x 2.4 cm = volume: 4.5 mL. Normal appearance/no adnexal mass. Pulsed Doppler evaluation demonstrates normal low-resistance arterial and venous waveforms in both ovaries. Other: Small free fluid. Large cystic and solid left mid abdominal mass measuring 15.9 x 8.8 x 11.6 cm, corresponding to the large left abdominal mass on CT visualized at the level of umbilicus. IMPRESSION: 1. Solid 5.5 cm right adnexal mass adjacent to or exophytic from right ovary. On CT, this appears contiguous with a large cystic and solid mass within the left mid abdomen; on ultrasound this mass measures up to 15.9 cm. Findings are suspicious for adnexal or ovarian neoplasm. Surgical consultation is recommended. A small amount of normal right ovarian tissue is imaged and does not demonstrate evidence for torsion. Negative for left ovarian torsion. 2. Small uterine fibroid. Electronically Signed   By: Donavan Foil M.D.   On: 12/01/2021 20:21   CT Angio Abd/Pel W and/or Wo Contrast  Result Date: 12/01/2021 CLINICAL DATA:  Lower abdominal pain and nausea, evaluate for mesenteric ischemia EXAM: CTA ABDOMEN AND PELVIS WITHOUT AND WITH CONTRAST TECHNIQUE: Multidetector CT imaging of the abdomen and pelvis was performed using the standard protocol during bolus administration of intravenous contrast. Multiplanar reconstructed images and MIPs were obtained and reviewed to evaluate the vascular anatomy. CONTRAST:  186mL OMNIPAQUE IOHEXOL 350 MG/ML SOLN COMPARISON:  09/16/2005 FINDINGS: VASCULAR Normal contour and caliber of the abdominal aorta. No evidence of aneurysm, dissection, or other acute aortic pathology. No significant atherosclerosis.  Standard branching pattern of the abdominal aorta, with solitary bilateral renal arteries. The branch vessel origins are patent, with specific attention to the mesenteric arteries. Review of the MIP images confirms the above findings. NON-VASCULAR Lower chest: No acute abnormality. Hepatobiliary: No focal liver abnormality is seen. No gallstones, gallbladder wall thickening, or biliary dilatation. Pancreas: Unremarkable. No pancreatic ductal dilatation or surrounding inflammatory changes. Spleen: Normal in size without focal abnormality. Adrenals/Urinary Tract: Adrenal glands are unremarkable. Kidneys are normal, without renal calculi, focal lesion, or hydronephrosis. Bladder is unremarkable. Stomach/Bowel: Stomach is within normal limits. Appendix appears normal. No evidence of bowel wall thickening, distention, or inflammatory changes. Lymphatic: No significant vascular findings are present. No enlarged abdominal or pelvic lymph nodes. Reproductive: There is a large, multicystic septated mass which although primarily located in the left hemiabdomen, appears to arise from the right ovary, measuring 17.5 x 11.1 x 9.4 cm (series 7, image 65, series 4, image 130). The left ovary and adjacent ovarian vessels appear to be distinctly separate from this mass. Normal uterus. Other: No abdominal wall hernia or abnormality. No abdominopelvic ascites. Musculoskeletal: No acute or significant osseous findings. IMPRESSION: 1. There is a large, multicystic septated mass which although primarily located in the left hemiabdomen, appears to arise from the right ovary, measuring 17.5 x 11.1 x 9.4 cm. This is highly concerning for primary ovarian malignancy. 2. No evidence of lymphadenopathy or metastatic disease in the abdomen or pelvis. 3. Normal contour and caliber of the abdominal aorta. No evidence of aneurysm, dissection, or other acute aortic pathology. No significant atherosclerosis. The branch vessel origins are patent, with  specific attention to the mesenteric arteries. Electronically Signed   By: Cristie Hem  Cherly Beach M.D.   On: 12/01/2021 16:15    Pending Labs Unresulted Labs (From admission, onward)     Start     Ordered   12/02/21 7619  Basic metabolic panel  Tomorrow morning,   R        12/01/21 2230   12/02/21 0500  CBC  Tomorrow morning,   R        12/01/21 2230   12/01/21 2230  HIV Antibody (routine testing w rflx)  (HIV Antibody (Routine testing w reflex) panel)  Once,   R        12/01/21 2230   12/01/21 2148  CEA  Once,   R        12/01/21 2147   12/01/21 2148  Cancer antigen 19-9  Once,   R        12/01/21 2147   12/01/21 2057  CA 125  Once,   STAT        12/01/21 2056   12/01/21 1203  Urinalysis, Routine w reflex microscopic Urine, Clean Catch  Once,   STAT        12/01/21 1202            Vitals/Pain Today's Vitals   12/01/21 2045 12/01/21 2100 12/01/21 2115 12/01/21 2345  BP: (!) 124/102  110/83 109/78  Pulse: (!) 126  (!) 105 100  Resp: 18  19 11   Temp:    98.2 F (36.8 C)  TempSrc:    Oral  SpO2: 100%  97% 97%  PainSc:  6       Isolation Precautions No active isolations  Medications Medications  HYDROmorphone (DILAUDID) injection 1 mg (1 mg Intravenous Given 12/01/21 2330)  oxyCODONE-acetaminophen (PERCOCET/ROXICET) 5-325 MG per tablet 1 tablet (1 tablet Oral Given 12/01/21 2329)  enoxaparin (LOVENOX) injection 40 mg (40 mg Subcutaneous Given 12/01/21 2329)  nirmatrelvir/ritonavir EUA (PAXLOVID) 3 tablet (3 tablets Oral Given 12/01/21 2328)  hydrochlorothiazide (HYDRODIURIL) tablet 12.5 mg (has no administration in time range)  escitalopram (LEXAPRO) tablet 20 mg (has no administration in time range)  norethindrone-ethinyl estradiol (LOESTRIN) 1-20 MG-MCG tablet 1 tablet (has no administration in time range)  morphine 4 MG/ML injection 4 mg (4 mg Intravenous Given 12/01/21 1328)  HYDROmorphone (DILAUDID) injection 1 mg (1 mg Intravenous Given 12/01/21 1427)  iohexol  (OMNIPAQUE) 350 MG/ML injection 100 mL (100 mLs Intravenous Contrast Given 12/01/21 1535)  fentaNYL (SUBLIMAZE) injection 100 mcg (100 mcg Intravenous Given 12/01/21 1621)  HYDROmorphone (DILAUDID) injection 1 mg (1 mg Intravenous Given 12/01/21 1823)  ondansetron (ZOFRAN) injection 4 mg (4 mg Intravenous Given 12/01/21 1823)  HYDROmorphone (DILAUDID) injection 1 mg (1 mg Intravenous Given 12/01/21 2003)    Mobility walks with person assist Low fall risk   Focused Assessments    R Recommendations: See Admitting Provider Note  Report given to:   Additional Notes:

## 2021-12-01 NOTE — ED Triage Notes (Signed)
BIBA Per EMS: Pt coming from home w/ c/o lower abd pain. Progressively worse since last night. No distention, no vomiting. Feels nauseous. Clammy in triage.   Vitals:  128/70 98HR  18RR 98% RA 130 CBG  98.4 temp

## 2021-12-01 NOTE — ED Provider Notes (Signed)
°[facility]-EMERGENCY DEPT °Provider Note ° ° °CSN: [id number] °Arrival date & time: 12/01/21  1048 ° °  ° °History °Chief Complaint  °Patient presents with  ° Abdominal Pain  ° ° °Yvonne [name] is a 50 y.o. female. ° °HPI ° °50-year-old female with past medical history of HTN, HLD, kidney stones presents to the emergency department with concern for lower abdominal pain, episode of nausea/vomiting.  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  At the end of her shift she developed lower abdominal pain that she describes as sharp shooting, extending from the left to right side.  The pain is persistent, waxing and waning in severity but never resolves.  She had 1 episode of nonbloody nausea/vomiting, denies any diarrhea.  She is still passing gas.  She has no urinary symptoms.  Felt feverish this morning but denies any active chills.  No previous surgeries in the abdomen. ° °Past Medical History:  °Diagnosis Date  ° Anemia   ° Arthritis   ° High blood pressure   ° Joint pain   ° Kidney stones   ° Microscopic hematuria   ° Vitamin D deficiency   ° ° °Patient Active Problem List  ° Diagnosis Date Noted  ° Depression 05/23/2019  ° Plantar fasciitis 08/23/2017  ° Insulin resistance 08/23/2017  ° Bilateral plantar fasciitis 07/07/2017  ° Hyperlipidemia 06/01/2017  ° At risk for diabetes mellitus 06/01/2017  ° Prediabetes 06/01/2017  ° Class 1 obesity with serious comorbidity and body mass index (BMI) of 30.0 to 30.9 in adult 06/01/2017  ° Class 1 obesity without serious comorbidity with body mass index (BMI) of 34.0 to 34.9 in adult 05/17/2017  ° Other fatigue 05/17/2017  ° Hypertension 06/30/2015  ° Family history of multiple myeloma 10/13/2013  ° Nevus 10/07/2012  ° Seasonal and perennial allergic rhinitis 04/23/2012  ° Knee pain 03/06/2012  ° Snoring 03/06/2012  ° Preventative health care 09/27/2011  ° Abnormal liver enzymes 09/27/2011  ° GRIEF REACTION 09/02/2009  ° MICROSCOPIC HEMATURIA  09/02/2009  ° Vitamin D deficiency 02/20/2009  ° ° °Past Surgical History:  °Procedure Laterality Date  ° NO PAST SURGERIES    ° Denies surgical history  °  ° °OB History   ° ° Gravida  °4  ° Para  °3  ° Term  °3  ° Preterm  °   ° AB  °   ° Living  °   °  ° ° SAB  °   ° IAB  °   ° Ectopic  °   ° Multiple  °   ° Live Births  °   °   °  °  ° ° °Family History  °Problem Relation Age of Onset  ° Healthy Father   ° Congestive Heart Failure Father   ° Heart disease Father   ° Diabetes Father   ° Other Mother   °     deceased from multiple myelomas  ° Cancer Mother   ° Cancer Maternal Grandmother   ° Multiple myeloma Maternal Grandmother   ° Cancer Maternal Grandfather   °     unsure what type of cancer  ° Prostate cancer Paternal Grandmother   ° Colon cancer Neg Hx   ° Esophageal cancer Neg Hx   ° Liver cancer Neg Hx   ° Pancreatic cancer Neg Hx   ° Rectal cancer Neg Hx   ° Stomach cancer Neg Hx   ° ° °Social History  ° °Tobacco   Use  ° Smoking status: Never  ° Smokeless tobacco: Never  °Vaping Use  ° Vaping Use: Never used  °Substance Use Topics  ° Alcohol use: No  ° Drug use: No  ° ° °Home Medications °Prior to Admission medications   °Medication Sig Start Date End Date Taking? Authorizing Provider  °escitalopram (LEXAPRO) 20 MG tablet TAKE 1 TABLET BY MOUTH DAILY. 11/26/21 12/26/21  Beasley, Caren D, MD  °hydrochlorothiazide (HYDRODIURIL) 12.5 MG tablet Take 1 tablet (12.5 mg total) by mouth daily. 11/26/21 12/28/21  Beasley, Caren D, MD  °JUNEL 1/20 1-20 MG-MCG tablet TAKE 1 TABLET BY MOUTH ONCE DAILY 12/31/20 12/31/21  Cousins, Sheronette, MD  °norethindrone-ethinyl estradiol (LOESTRIN) 1-20 MG-MCG tablet TAKE 1 TABLET BY MOUTH ONCE DAILY *CONTINUOUS TABLETS* 01/13/21 01/13/22  Cousins, Sheronette, MD  °Semaglutide, 2 MG/DOSE, 8 MG/3ML SOPN Inject 2 mg as directed once a week. 11/26/21   Beasley, Caren D, MD  °Vitamin D, Ergocalciferol, (DRISDOL) 1.25 MG (50000 UNIT) CAPS capsule Take 1 capsule by mouth every 7  days.  11/26/21 12/26/21  Beasley, Caren D, MD  ° ° °Allergies    °Patient has no known allergies. ° °Review of Systems   °Review of Systems  °Constitutional:  Positive for fatigue and fever. Negative for chills.  °HENT:  Negative for congestion.   °Eyes:  Negative for visual disturbance.  °Respiratory:  Negative for shortness of breath.   °Cardiovascular:  Negative for chest pain.  °Gastrointestinal:  Positive for abdominal pain, nausea and vomiting. Negative for abdominal distention, constipation and diarrhea.  °Genitourinary:  Negative for difficulty urinating, dysuria and pelvic pain.  °Skin:  Negative for rash.  °Neurological:  Negative for headaches.  ° °Physical Exam °Updated Vital Signs °BP (!) 129/93    Pulse (!) 104    Temp 98.3 °F (36.8 °C) (Oral)    Resp 12    SpO2 100%  ° °Physical Exam °Vitals and nursing note reviewed.  °Constitutional:   °   Appearance: Normal appearance.  °HENT:  °   Head: Normocephalic.  °   Mouth/Throat:  °   Mouth: Mucous membranes are moist.  °Cardiovascular:  °   Rate and Rhythm: Normal rate.  °Pulmonary:  °   Effort: Pulmonary effort is normal. No respiratory distress.  °Abdominal:  °   General: Bowel sounds are normal. There is no distension.  °   Palpations: Abdomen is soft.  °   Tenderness: There is abdominal tenderness in the right lower quadrant, suprapubic area and left lower quadrant. There is no guarding or rebound. Negative signs include Murphy's sign and McBurney's sign.  °Skin: °   General: Skin is warm.  °Neurological:  °   Mental Status: She is alert and oriented to person, place, and time. Mental status is at baseline.  °Psychiatric:     °   Mood and Affect: Mood normal.  ° ° °ED Results / Procedures / Treatments   °Labs °(all labs ordered are listed, but only abnormal results are displayed) °Labs Reviewed  °CBC WITH DIFFERENTIAL/PLATELET  °COMPREHENSIVE METABOLIC PANEL  °LIPASE, BLOOD  °URINALYSIS, ROUTINE W REFLEX MICROSCOPIC  °I-STAT BETA HCG BLOOD, ED ([facility], [facility], [facility]  ONLY)  ° ° °EKG °None ° °Radiology °No results found. ° °Procedures °Procedures  ° °Medications Ordered in ED °Medications - No data to display ° °ED Course  °I have reviewed the triage vital signs and the nursing notes. ° °Pertinent labs & imaging results that were available during my care of the patient   were reviewed by me and considered in my medical decision making (see chart for details). ° °  °MDM Rules/Calculators/A&P °                        ° °50-year-old female presents emergency department with sudden onset lower abdominal pain.  Patient is tender in the lower quadrants, the pain is not unilateral.  No fever.  Blood work shows a leukocytosis of 20.  She is requiring multiple rounds of IV medication for pain control. ° °CT of the abdomen pelvis shows new large hemiabdomen mass that seems to be extending from the right ovary.  No other findings of ovarian torsion however given the sudden onset of pain we will rule out torsion with ultrasound.  Spoke with Dr. Cousins who is the patient's primary OB/GYN.  They agree with ultrasound to rule out torsion, if this is negative recommend medical admission for pain control and Yvonne [name] evaluation.  Patient signed out to Dr. Gray pending ultrasound.  Patient was updated on these findings and plan. ° ° ° ° °Final Clinical Impression(s) / ED Diagnoses °Final diagnoses:  °None  ° ° °Rx / DC Orders °ED Discharge Orders   ° ° None  ° °  ° °  °Horton, Kristie M, DO °12/01/21 1735 ° °

## 2021-12-01 NOTE — H&P (Signed)
History and Physical    Yvonne [name] JKD:[id number] DOB: Nov 14, 1971 DOA: 12/01/2021  PCP: Debbrah Alar, NP  Patient coming from: Home  I have personally briefly reviewed patient's old medical records in [place]  Chief Complaint: Abdominal pain  HPI: Yvonne [name] is a 50 y.o. female with medical history significant for hypertension and hyperlipidemia who presents with concerns of severe abdominal pain.  Patient got off from work here at [employer] last night and began to have acute lower abdominal pain/pressure.  She was still able to eat and go to sleep afterwards although having a full bladder also gave her more abdominal pressure.  She woke up this morning around 6 and again felt severe lower abdominal pain.  States its the worst that she has ever felt.  No relief with Tylenol or ibuprofen.  It was so severe that she was unable to walk and had her husband call EMS. In the ED, she had CT angio abdomen and pelvis with large multicystic septated mass in the left hemiabdomen that appears to arise from the right ovary concerning for primary ovarian malignancy.  Patient reports that she has had intentional gradual weight loss since she is in a weight loss program and taking Ozempic.  She denies any history of tobacco use.  No family history of any breast or ovarian cancer.  She is up-to-date on her Pap smear and mammogram for this year.  She had a routine colonoscopy in 2019 with multiple polyps with [facility] and was told to follow up in 5 years.  Her primary OB/GYN Dr. Garwin Brothers was consulted by ED physician and a pelvic Doppler ultrasound was recommended which did not show concerns for ovarian torsion.  However it does show solid 5.5 cm right adnexal mass adjacent to or exophytic from right ovary.  This is contiguous with a large cystic and solid mass within the left mid abdomen measuring up to 15.9 cm.  She was afebrile, tachycardic up to 120s, mildly hypertensive to  systolic of 099I over 338 on room air. Leukocytosis of 19.9K, hemoglobin of 14.4, lactate of 1.5 Sodium of 135, K of 3.9, BG of 125, creatinine of 0.84, LFTs with only mildly elevated ALT of 71.  COVID test was also incidentally positive.  She denies any upper respiratory viral symptoms.  She was put on IV Dilaudid $RemoveBef'1mg'vSVktcUUsT$  q2hr with some relief.  Hospitalist then called for admission for further management of abdominal pain and malignancy work-up.  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  Her other full-time job is with [employer].  She is married with 3 children at the age of 69, 87 and 41.   Past Medical History:  Diagnosis Date   Anemia    Arthritis    High blood pressure    Joint pain    Kidney stones    Microscopic hematuria    Vitamin D deficiency     Past Surgical History:  Procedure Laterality Date  NO PAST SURGERIES     Denies surgical history     reports that she has never smoked. She has never used smokeless tobacco. She reports that she does not drink alcohol and does not use drugs. Social History  No Known Allergies  Family History  Problem Relation Age of Onset   Healthy Father    Congestive Heart Failure Father    Heart disease Father    Diabetes Father    Other Mother        deceased from multiple myelomas   Cancer Mother    Cancer Maternal Grandmother     Multiple myeloma Maternal Grandmother    Cancer Maternal Grandfather        unsure what type of cancer   Prostate cancer Paternal Grandmother    Colon cancer Neg Hx    Esophageal cancer Neg Hx    Liver cancer Neg Hx    Pancreatic cancer Neg Hx    Rectal cancer Neg Hx    Stomach cancer Neg Hx      Prior to Admission medications   Medication Sig Start Date End Date Taking? Authorizing Provider  escitalopram (LEXAPRO) 20 MG tablet TAKE 1 TABLET BY MOUTH DAILY. 11/26/21 12/26/21 Yes Beasley, Caren D, MD  hydrochlorothiazide (HYDRODIURIL) 12.5 MG tablet Take 1 tablet (12.5 mg total) by mouth daily. 11/26/21 12/28/21 Yes Beasley, Caren D, MD  ibuprofen (ADVIL) 200 MG tablet Take 400 mg by mouth every 6 (six) hours as needed for moderate pain.   Yes [provider]  JUNEL 1/20 1-20 MG-MCG tablet TAKE 1 TABLET BY MOUTH ONCE DAILY 12/31/20 12/31/21 Yes Cousins, Alanda Slim, MD  Semaglutide, 2 MG/DOSE, 8 MG/3ML SOPN Inject 2 mg as directed once a week. 11/26/21  Yes Beasley, Caren D, MD  Vitamin D, Ergocalciferol, (DRISDOL) 1.25 MG (50000 UNIT) CAPS capsule Take 1 capsule by mouth every 7  days. 11/26/21 12/26/21 Yes Dennard Nip D, MD  norethindrone-ethinyl estradiol (LOESTRIN) 1-20 MG-MCG tablet TAKE 1 TABLET BY MOUTH ONCE DAILY *CONTINUOUS TABLETS* Patient not taking: Reported on 12/01/2021 01/13/21 01/13/22  Servando Salina, MD    Physical Exam: Vitals:   12/01/21 1945 12/01/21 2015 12/01/21 2045 12/01/21 2115  BP: (!) 131/94 (!) 134/95 (!) 124/102 110/83  Pulse: 99 (!) 105 (!) 126 (!) 105  Resp: $Remo'20 19 18 19  'wQrHI$ Temp:      TempSrc:      SpO2: 99% 98% 100% 97%    Constitutional: NAD, calm, comfortable, middle-aged female appearing younger than her stated age lying flat in bed Vitals:   12/01/21 1945 12/01/21 2015 12/01/21 2045 12/01/21 2115  BP: (!) 131/94 (!) 134/95 (!) 124/102 110/83  Pulse: 99 (!) 105 (!) 126 (!) 105  Resp: $Remo'20 19 18 19  'VvlNe$ Temp:      TempSrc:      SpO2: 99%  98% 100% 97%   Eyes: lids and conjunctivae normal ENMT: Mucous membranes are moist.  Neck: normal, supple Respiratory: clear to auscultation bilaterally, no wheezing, no crackles. Normal respiratory effort. No accessory muscle use.  Cardiovascular: Regular rate and rhythm, no murmurs / rubs / gallops. No extremity edema.  Abdomen: no tenderness after receiving IV Dilaudid, no masses palpated.  No rebound tenderness, guarding or rigidity.  Bowel sounds positive.  Musculoskeletal: no clubbing / cyanosis. No joint deformity upper and lower extremities. Good ROM, no contractures. Normal muscle tone.  Skin: no rashes, lesions, ulcers. No induration Neurologic: CN 2-12 grossly intact.  Strength 5/5 in all 4.  Psychiatric:  Normal judgment and insight. Alert and oriented x 3. Normal mood and positive outlook   Labs on Admission: I have personally reviewed following labs and imaging studies  CBC: Recent Labs  Lab 12/01/21 1243  WBC 19.9*  NEUTROABS 16.9*  HGB 14.4  HCT 43.1  MCV 94.9  PLT 161   Basic Metabolic Panel: Recent Labs  Lab 11/26/21 0739 12/01/21 1243  NA 140 135  K 4.1 3.9  CL 106 105  CO2 19* 21*  GLUCOSE 93 125*  BUN 14 11  CREATININE 0.98 0.84  CALCIUM 10.0 9.9   GFR: Estimated Creatinine Clearance: 88.5 mL/min (by C-G formula based on SCr of 0.84 mg/dL). Liver Function Tests: Recent Labs  Lab 11/26/21 0739 12/01/21 1243  AST 57* 25  ALT 138* 71*  ALKPHOS 78 61  BILITOT 0.3 0.9  PROT 6.6 7.4  ALBUMIN 4.2 3.9   Recent Labs  Lab 12/01/21 1243  LIPASE 33   No results for input(s): AMMONIA in the last 168 hours. Coagulation Profile: No results for input(s): INR, PROTIME in the last 168 hours. Cardiac Enzymes: No results for input(s): CKTOTAL, CKMB, CKMBINDEX, TROPONINI in the last 168 hours. BNP (last 3 results) No results for input(s): PROBNP in the last 8760 hours. HbA1C: No results for input(s): HGBA1C in the last 72 hours. CBG: No results for  input(s): GLUCAP in the last 168 hours. Lipid Profile: No results for input(s): CHOL, HDL, LDLCALC, TRIG, CHOLHDL, LDLDIRECT in the last 72 hours. Thyroid Function Tests: No results for input(s): TSH, T4TOTAL, FREET4, T3FREE, THYROIDAB in the last 72 hours. Anemia Panel: No results for input(s): VITAMINB12, FOLATE, FERRITIN, TIBC, IRON, RETICCTPCT in the last 72 hours. Urine analysis:    Component Value Date/Time   COLORURINE YELLOW 07/06/2019 0134   APPEARANCEUR CLEAR 07/06/2019 0134   LABSPEC 1.011 07/06/2019 0134   PHURINE 6.0 07/06/2019 0134   GLUCOSEU NEGATIVE 07/06/2019 0134   GLUCOSEU NEGATIVE 03/30/2018 0744   HGBUR MODERATE (A) 07/06/2019 0134   BILIRUBINUR NEGATIVE 07/06/2019 0134   KETONESUR NEGATIVE 07/06/2019 0134   PROTEINUR NEGATIVE 07/06/2019 0134   UROBILINOGEN 0.2 03/30/2018 0744   NITRITE NEGATIVE 07/06/2019 0134   LEUKOCYTESUR NEGATIVE 07/06/2019 0134    Radiological Exams on Admission: US Pelvis Complete  Result Date: 12/01/2021 CLINICAL DATA:  Pelvic pain with ovarian mass EXAM: TRANSABDOMINAL ULTRASOUND OF PELVIS DOPPLER ULTRASOUND OF OVARIES TECHNIQUE: Transabdominal ultrasound examination of the pelvis was performed including evaluation of the uterus, ovaries, adnexal regions, and pelvic cul-de-sac. Color and duplex Doppler ultrasound was utilized to evaluate blood flow to the ovaries. COMPARISON:  CT 12/01/2021 FINDINGS: Uterus Measurements: 13 x 5.6 x 7.1 cm = volume: 270.9 mL. Possible small fibroid within the subserosal anterior uterine fundus measuring 7 mm. Endometrium Thickness: 3.5 mm.  No focal abnormality visualized. Right ovary Measurements: 2.3 x 1.7 x 1.8 cm = volume: 3.7 mL. Solid adnexal mass adjacent to or a exophytic to right ovary measuring 5.5 x 4.3 x 4.8 cm. Left ovary Measurements: 2.2 x 1.5 x 2.4 cm = volume: 4.5 mL. Normal appearance/no adnexal mass. Pulsed Doppler evaluation demonstrates normal low-resistance arterial and venous waveforms  in both ovaries. Other: Small free fluid. Large cystic and solid left mid abdominal mass measuring 15.9 x 8.8 x 11.6 cm, corresponding to the large left abdominal mass on CT visualized at the level of umbilicus. IMPRESSION: 1. Solid 5.5 cm right adnexal mass adjacent to or exophytic from right ovary. On CT, this appears contiguous with a large cystic  and solid mass within the left mid abdomen; on ultrasound this mass measures up to 15.9 cm. Findings are suspicious for adnexal or ovarian neoplasm. Surgical consultation is recommended. A small amount of normal right ovarian tissue is imaged and does not demonstrate evidence for torsion. Negative for left ovarian torsion. 2. Small uterine fibroid. Electronically Signed   By: Donavan Foil M.D.   On: 12/01/2021 20:21   [place] Art/Ven Flow Abd Pelv Doppler  Result Date: 12/01/2021 CLINICAL DATA:  Pelvic pain with ovarian mass EXAM: TRANSABDOMINAL ULTRASOUND OF PELVIS DOPPLER ULTRASOUND OF OVARIES TECHNIQUE: Transabdominal ultrasound examination of the pelvis was performed including evaluation of the uterus, ovaries, adnexal regions, and pelvic cul-de-sac. Color and duplex Doppler ultrasound was utilized to evaluate blood flow to the ovaries. COMPARISON:  CT 12/01/2021 FINDINGS: Uterus Measurements: 13 x 5.6 x 7.1 cm = volume: 270.9 mL. Possible small fibroid within the subserosal anterior uterine fundus measuring 7 mm. Endometrium Thickness: 3.5 mm.  No focal abnormality visualized. Right ovary Measurements: 2.3 x 1.7 x 1.8 cm = volume: 3.7 mL. Solid adnexal mass adjacent to or a exophytic to right ovary measuring 5.5 x 4.3 x 4.8 cm. Left ovary Measurements: 2.2 x 1.5 x 2.4 cm = volume: 4.5 mL. Normal appearance/no adnexal mass. Pulsed Doppler evaluation demonstrates normal low-resistance arterial and venous waveforms in both ovaries. Other: Small free fluid. Large cystic and solid left mid abdominal mass measuring 15.9 x 8.8 x 11.6 cm, corresponding to the large left  abdominal mass on CT visualized at the level of umbilicus. IMPRESSION: 1. Solid 5.5 cm right adnexal mass adjacent to or exophytic from right ovary. On CT, this appears contiguous with a large cystic and solid mass within the left mid abdomen; on ultrasound this mass measures up to 15.9 cm. Findings are suspicious for adnexal or ovarian neoplasm. Surgical consultation is recommended. A small amount of normal right ovarian tissue is imaged and does not demonstrate evidence for torsion. Negative for left ovarian torsion. 2. Small uterine fibroid. Electronically Signed   By: Donavan Foil M.D.   On: 12/01/2021 20:21   CT Angio Abd/Pel W and/or Wo Contrast  Result Date: 12/01/2021 CLINICAL DATA:  Lower abdominal pain and nausea, evaluate for mesenteric ischemia EXAM: CTA ABDOMEN AND PELVIS WITHOUT AND WITH CONTRAST TECHNIQUE: Multidetector CT imaging of the abdomen and pelvis was performed using the standard protocol during bolus administration of intravenous contrast. Multiplanar reconstructed images and MIPs were obtained and reviewed to evaluate the vascular anatomy. CONTRAST:  140mL OMNIPAQUE IOHEXOL 350 MG/ML SOLN COMPARISON:  09/16/2005 FINDINGS: VASCULAR Normal contour and caliber of the abdominal aorta. No evidence of aneurysm, dissection, or other acute aortic pathology. No significant atherosclerosis. Standard branching pattern of the abdominal aorta, with solitary bilateral renal arteries. The branch vessel origins are patent, with specific attention to the mesenteric arteries. Review of the MIP images confirms the above findings. NON-VASCULAR Lower chest: No acute abnormality. Hepatobiliary: No focal liver abnormality is seen. No gallstones, gallbladder wall thickening, or biliary dilatation. Pancreas: Unremarkable. No pancreatic ductal dilatation or surrounding inflammatory changes. Spleen: Normal in size without focal abnormality. Adrenals/Urinary Tract: Adrenal glands are unremarkable. Kidneys are  normal, without renal calculi, focal lesion, or hydronephrosis. Bladder is unremarkable. Stomach/Bowel: Stomach is within normal limits. Appendix appears normal. No evidence of bowel wall thickening, distention, or inflammatory changes. Lymphatic: No significant vascular findings are present. No enlarged abdominal or pelvic lymph nodes. Reproductive: There is a large, multicystic septated mass which although primarily located in  the left hemiabdomen, appears to arise from the right ovary, measuring 17.5 x 11.1 x 9.4 cm (series 7, image 65, series 4, image 130). The left ovary and adjacent ovarian vessels appear to be distinctly separate from this mass. Normal uterus. Other: No abdominal wall hernia or abnormality. No abdominopelvic ascites. Musculoskeletal: No acute or significant osseous findings. IMPRESSION: 1. There is a large, multicystic septated mass which although primarily located in the left hemiabdomen, appears to arise from the right ovary, measuring 17.5 x 11.1 x 9.4 cm. This is highly concerning for primary ovarian malignancy. 2. No evidence of lymphadenopathy or metastatic disease in the abdomen or pelvis. 3. Normal contour and caliber of the abdominal aorta. No evidence of aneurysm, dissection, or other acute aortic pathology. No significant atherosclerosis. The branch vessel origins are patent, with specific attention to the mesenteric arteries. Electronically Signed   By: Delanna Ahmadi M.D.   On: 12/01/2021 16:15      Assessment/Plan  Large abdominal/right ovarian mass concerning for malignancy -will obtain tumor marker with CA 19-9, CA125, CEA -no concerns for torsion on transabdominal ultrasound -scheduled Percocet q4hr with PRN IV Dilaudid $RemoveBef'1mg'NQqiUvBaGY$  q2hr PRN for moderate and severe pain  -needs GYN-ONC consult in the morning  COVID-viral illness incidental finding but given new mass and potential immunocompromise state- start Paxlovid   Leukocytosis Likely reactive to potential  malignancy  HTN Continue HCTZ  History of heavy menses Continue OCP  Depression Continue Lexapro  DVT prophylaxis:.Lovenox Code Status: Full Family Communication: Plan discussed with patient at bedside  disposition Plan: Home with at least 2 midnight stays  Consults called:  Admission status: inpatient  Level of care: Telemetry  Status is: Inpatient  Remains inpatient appropriate because: Admit - It is my clinical opinion that admission to INPATIENT is reasonable and necessary because this patient will require at least 2 midnights in the hospital to treat this condition based on the medical complexity of the problems presented.  Given the aforementioned information, the predictability of an adverse outcome is felt to be significant.         Orene Desanctis DO [facility]   If 7PM-7AM, please contact night-coverage [email or web address]   12/01/2021, 10:31 PM

## 2021-12-01 NOTE — ED Provider Notes (Signed)
°  Provider Note MRN:  [id number]  Arrival date & time: 12/02/21    ED Course and Medical Decision Making  Assumed care from Dr Dina Rich at shift change.  See not from prior team for complete details, in brief:   Pt with abnormal CT, [place] is pending to r/o torsion or ovarian mass, eventually admit to gyn/onc for eval (Dr Garwin Brothers is her primary OBGYN); intractable abd pain.    [place] completed, finds as below 1. Solid 5.5 cm right adnexal mass adjacent to or exophytic from  right ovary. On CT, this appears contiguous with a large cystic and  solid mass within the left mid abdomen; on ultrasound this mass  measures up to 15.9 cm. Findings are suspicious for adnexal or  ovarian neoplasm. Surgical consultation is recommended. A small  amount of normal right ovarian tissue is imaged and does not  demonstrate evidence for torsion. Negative for left ovarian torsion.    Large abdominal mass, concern for ovarian neoplasm, pt also with intractable abdominal pain that has required multiple doses of IV analgesics. Gyn-onc consult placed.   D/w her primary OB who agrees with plan  Pt also found to have [place], she has no respiratory complaints and is breathing comfortably on room air. She is vaccinated.   Recommend admission to hospitalist service pending gyn-onc evaluation, pain control for likely neoplastic process. D/w hospitalist who accepts patient for admission.     .Critical Care Performed by: Jeanell Sparrow, DO Authorized by: Jeanell Sparrow, DO   Critical care provider statement:    Critical care time (minutes):  30   Critical care time was exclusive of:  Separately billable procedures and treating other patients   Critical care was time spent personally by me on the following activities:  Development of treatment plan with patient or surrogate, discussions with consultants, evaluation of patient's response to treatment, examination of patient, ordering and review of laboratory studies,  ordering and review of radiographic studies, ordering and performing treatments and interventions, pulse oximetry, re-evaluation of patient's condition and review of old charts   Care discussed with: admitting provider   Comments:     Intractable pain requiring multiple doses of IV analgesics   Final Clinical Impressions(s) / ED Diagnoses     ICD-10-CM   1. COVID-19  U07.1     2. Ovarian mass  N83.8     3. Intractable abdominal pain  R10.9       ED Discharge Orders     None       Discharge Instructions   None        Jeanell Sparrow, DO 12/02/21 0106

## 2021-12-02 ENCOUNTER — Inpatient Hospital Stay (HOSPITAL_COMMUNITY): Payer: 59 | Admitting: Certified Registered Nurse Anesthetist

## 2021-12-02 ENCOUNTER — Encounter (HOSPITAL_COMMUNITY): Payer: Self-pay | Admitting: Family Medicine

## 2021-12-02 ENCOUNTER — Encounter (HOSPITAL_COMMUNITY)
Admission: EM | Disposition: A | Discharge status: Home / Self Care | Payer: Self-pay | Source: Home / Self Care | Attending: Gynecologic Oncology

## 2021-12-02 ENCOUNTER — Inpatient Hospital Stay (HOSPITAL_COMMUNITY): Payer: 59

## 2021-12-02 DIAGNOSIS — K661 Hemoperitoneum: Secondary | ICD-10-CM | POA: Diagnosis not present

## 2021-12-02 DIAGNOSIS — N83521 Torsion of right fallopian tube: Secondary | ICD-10-CM | POA: Diagnosis not present

## 2021-12-02 DIAGNOSIS — N83511 Torsion of right ovary and ovarian pedicle: Secondary | ICD-10-CM | POA: Diagnosis not present

## 2021-12-02 DIAGNOSIS — U071 COVID-19: Secondary | ICD-10-CM | POA: Diagnosis not present

## 2021-12-02 HISTORY — PX: LAPAROTOMY: SHX154

## 2021-12-02 LAB — URINALYSIS, ROUTINE W REFLEX MICROSCOPIC
Bacteria, UA: NONE SEEN
Bilirubin Urine: NEGATIVE
Bilirubin Urine: NEGATIVE
Glucose, UA: NEGATIVE mg/dL
Glucose, UA: NEGATIVE mg/dL
Ketones, ur: 40 mg/dL — AB
Ketones, ur: 5 mg/dL — AB
Leukocytes,Ua: NEGATIVE
Leukocytes,Ua: NEGATIVE
Nitrite: NEGATIVE
Nitrite: NEGATIVE
Protein, ur: 30 mg/dL — AB
Specific Gravity, Urine: 1.015 (ref 1.005–1.030)
Specific Gravity, Urine: 1.02 (ref 1.005–1.030)
pH: 6 (ref 5.0–8.0)
pH: 6 (ref 5.0–8.0)

## 2021-12-02 LAB — CBC WITH DIFFERENTIAL/PLATELET
Abs Immature Granulocytes: 0.15 10*3/uL — ABNORMAL HIGH (ref 0.00–0.07)
Basophils Absolute: 0 10*3/uL (ref 0.0–0.1)
Basophils Relative: 0 %
Eosinophils Absolute: 0 10*3/uL (ref 0.0–0.5)
Eosinophils Relative: 0 %
HCT: 30.2 % — ABNORMAL LOW (ref 36.0–46.0)
Hemoglobin: 10.3 g/dL — ABNORMAL LOW (ref 12.0–15.0)
Immature Granulocytes: 1 %
Lymphocytes Relative: 9 %
Lymphs Abs: 2 10*3/uL (ref 0.7–4.0)
MCH: 32.2 pg (ref 26.0–34.0)
MCHC: 34.1 g/dL (ref 30.0–36.0)
MCV: 94.4 fL (ref 80.0–100.0)
Monocytes Absolute: 2 10*3/uL — ABNORMAL HIGH (ref 0.1–1.0)
Monocytes Relative: 9 %
Neutro Abs: 18.4 10*3/uL — ABNORMAL HIGH (ref 1.7–7.7)
Neutrophils Relative %: 81 %
Platelets: 263 10*3/uL (ref 150–400)
RBC: 3.2 MIL/uL — ABNORMAL LOW (ref 3.87–5.11)
RDW: 14.5 % (ref 11.5–15.5)
WBC: 22.6 10*3/uL — ABNORMAL HIGH (ref 4.0–10.5)
nRBC: 0 % (ref 0.0–0.2)

## 2021-12-02 LAB — PROCALCITONIN: Procalcitonin: 0.55 ng/mL

## 2021-12-02 LAB — BASIC METABOLIC PANEL
Anion gap: 10 (ref 5–15)
BUN: 18 mg/dL (ref 6–20)
CO2: 21 mmol/L — ABNORMAL LOW (ref 22–32)
Calcium: 9.2 mg/dL (ref 8.9–10.3)
Chloride: 105 mmol/L (ref 98–111)
Creatinine, Ser: 1.57 mg/dL — ABNORMAL HIGH (ref 0.44–1.00)
GFR, Estimated: 40 mL/min — ABNORMAL LOW (ref >60)
Glucose, Bld: 168 mg/dL — ABNORMAL HIGH (ref 70–99)
Potassium: 4.2 mmol/L (ref 3.5–5.1)
Sodium: 136 mmol/L (ref 135–145)

## 2021-12-02 LAB — CBC
HCT: 38.1 % (ref 36.0–46.0)
Hemoglobin: 12.7 g/dL (ref 12.0–15.0)
MCH: 31.4 pg (ref 26.0–34.0)
MCHC: 33.3 g/dL (ref 30.0–36.0)
MCV: 94.3 fL (ref 80.0–100.0)
Platelets: 307 10*3/uL (ref 150–400)
RBC: 4.04 MIL/uL (ref 3.87–5.11)
RDW: 14.5 % (ref 11.5–15.5)
WBC: 24.4 10*3/uL — ABNORMAL HIGH (ref 4.0–10.5)
nRBC: 0 % (ref 0.0–0.2)

## 2021-12-02 LAB — LACTIC ACID, PLASMA: Lactic Acid, Venous: 1.6 mmol/L (ref 0.5–1.9)

## 2021-12-02 LAB — HIV ANTIBODY (ROUTINE TESTING W REFLEX): HIV Screen 4th Generation wRfx: NONREACTIVE

## 2021-12-02 SURGERY — LAPAROTOMY, EXPLORATORY
Anesthesia: General | Site: Abdomen

## 2021-12-02 MED ORDER — MIDAZOLAM HCL 2 MG/2ML IJ SOLN
INTRAMUSCULAR | Status: AC
  Filled 2021-12-02: qty 2

## 2021-12-02 MED ORDER — FENTANYL CITRATE (PF) 250 MCG/5ML IJ SOLN
INTRAMUSCULAR | Status: AC
  Filled 2021-12-02: qty 5

## 2021-12-02 MED ORDER — LABETALOL HCL 5 MG/ML IV SOLN
INTRAVENOUS | Status: DC | PRN
  Administered 2021-12-02: 2.5 mg via INTRAVENOUS
  Administered 2021-12-02: 5 mg via INTRAVENOUS

## 2021-12-02 MED ORDER — SUCCINYLCHOLINE CHLORIDE 200 MG/10ML IV SOSY
PREFILLED_SYRINGE | INTRAVENOUS | Status: DC | PRN
  Administered 2021-12-02: 140 mg via INTRAVENOUS

## 2021-12-02 MED ORDER — SUCCINYLCHOLINE CHLORIDE 200 MG/10ML IV SOSY
PREFILLED_SYRINGE | INTRAVENOUS | Status: AC
  Filled 2021-12-02: qty 10

## 2021-12-02 MED ORDER — PROPOFOL 10 MG/ML IV BOLUS
INTRAVENOUS | Status: DC | PRN
  Administered 2021-12-02: 200 mg via INTRAVENOUS
  Administered 2021-12-02: 40 mg via INTRAVENOUS

## 2021-12-02 MED ORDER — SODIUM CHLORIDE (PF) 0.9 % IJ SOLN
INTRAMUSCULAR | Status: AC
  Filled 2021-12-02: qty 20

## 2021-12-02 MED ORDER — DEXAMETHASONE SODIUM PHOSPHATE 10 MG/ML IJ SOLN
INTRAMUSCULAR | Status: DC | PRN
  Administered 2021-12-02: 10 mg via INTRAVENOUS

## 2021-12-02 MED ORDER — BUPIVACAINE HCL 0.25 % IJ SOLN
INTRAMUSCULAR | Status: DC | PRN
  Administered 2021-12-02: 20 mL

## 2021-12-02 MED ORDER — MIDAZOLAM HCL 5 MG/5ML IJ SOLN
INTRAMUSCULAR | Status: DC | PRN
  Administered 2021-12-02: 2 mg via INTRAVENOUS

## 2021-12-02 MED ORDER — ROCURONIUM BROMIDE 10 MG/ML (PF) SYRINGE
PREFILLED_SYRINGE | INTRAVENOUS | Status: DC | PRN
  Administered 2021-12-02: 60 mg via INTRAVENOUS

## 2021-12-02 MED ORDER — LIDOCAINE 2% (20 MG/ML) 5 ML SYRINGE
INTRAMUSCULAR | Status: DC | PRN
  Administered 2021-12-02: 60 mg via INTRAVENOUS

## 2021-12-02 MED ORDER — PROMETHAZINE HCL 25 MG/ML IJ SOLN
6.2500 mg | INTRAMUSCULAR | Status: DC | PRN
Start: 2021-12-02 — End: 2021-12-03

## 2021-12-02 MED ORDER — LACTATED RINGERS IV SOLN: INTRAVENOUS | Status: DC | PRN

## 2021-12-02 MED ORDER — SODIUM CHLORIDE 0.9 % IV BOLUS
1000.0000 mL | Freq: Once | INTRAVENOUS | Status: AC
  Administered 2021-12-02: 16:00:00 1000 mL via INTRAVENOUS

## 2021-12-02 MED ORDER — FENTANYL CITRATE (PF) 250 MCG/5ML IJ SOLN: INTRAMUSCULAR | Status: DC | PRN

## 2021-12-02 MED ORDER — OXYCODONE HCL 5 MG/5ML PO SOLN: 5.0000 mg | Freq: Once | ORAL | Status: DC | PRN

## 2021-12-02 MED ORDER — SODIUM CHLORIDE 0.9 % IV SOLN: INTRAVENOUS | Status: DC

## 2021-12-02 MED ORDER — BUPIVACAINE LIPOSOME 1.3 % IJ SUSP
INTRAMUSCULAR | Status: DC | PRN
  Administered 2021-12-02: 20 mL

## 2021-12-02 MED ORDER — LIDOCAINE HCL (PF) 2 % IJ SOLN
INTRAMUSCULAR | Status: AC
  Filled 2021-12-02: qty 5

## 2021-12-02 MED ORDER — FENTANYL CITRATE (PF) 100 MCG/2ML IJ SOLN
INTRAMUSCULAR | Status: AC
  Filled 2021-12-02: qty 2

## 2021-12-02 MED ORDER — SODIUM CHLORIDE 0.9 % IV SOLN
1.0000 g | INTRAVENOUS | Status: DC
  Administered 2021-12-02: 20:00:00 1 g via INTRAVENOUS
  Filled 2021-12-02 (×2): qty 10

## 2021-12-02 MED ORDER — PHENYLEPHRINE HCL (PRESSORS) 10 MG/ML IV SOLN
INTRAVENOUS | Status: DC | PRN
  Administered 2021-12-02: 120 ug via INTRAVENOUS
  Administered 2021-12-02: 80 ug via INTRAVENOUS

## 2021-12-02 MED ORDER — ACETAMINOPHEN 325 MG PO TABS: 650.0000 mg | ORAL_TABLET | Freq: Once | ORAL | Status: DC

## 2021-12-02 MED ORDER — ONDANSETRON HCL 4 MG/2ML IJ SOLN
INTRAMUSCULAR | Status: DC | PRN
  Administered 2021-12-02: 4 mg via INTRAVENOUS

## 2021-12-02 MED ORDER — OXYCODONE HCL 5 MG PO TABS: 5.0000 mg | ORAL_TABLET | Freq: Once | ORAL | Status: DC | PRN

## 2021-12-02 MED ORDER — HYDROMORPHONE HCL 2 MG/ML IJ SOLN
INTRAMUSCULAR | Status: AC
  Filled 2021-12-02: qty 1

## 2021-12-02 MED ORDER — PROPOFOL 10 MG/ML IV BOLUS
INTRAVENOUS | Status: AC
  Filled 2021-12-02: qty 20

## 2021-12-02 MED ORDER — CEFAZOLIN SODIUM-DEXTROSE 2-4 GM/100ML-% IV SOLN
2.0000 g | Freq: Once | INTRAVENOUS | Status: AC
  Administered 2021-12-02: 22:00:00 2 g via INTRAVENOUS

## 2021-12-02 MED ORDER — FENTANYL CITRATE (PF) 100 MCG/2ML IJ SOLN
INTRAMUSCULAR | Status: DC | PRN
  Administered 2021-12-02: 50 ug via INTRAVENOUS
  Administered 2021-12-02 (×2): 100 ug via INTRAVENOUS
  Administered 2021-12-02 (×2): 50 ug via INTRAVENOUS

## 2021-12-02 MED ORDER — SUGAMMADEX SODIUM 500 MG/5ML IV SOLN
INTRAVENOUS | Status: DC | PRN
  Administered 2021-12-02: 250 mg via INTRAVENOUS

## 2021-12-02 MED ORDER — BUPIVACAINE-EPINEPHRINE (PF) 0.25% -1:200000 IJ SOLN
INTRAMUSCULAR | Status: AC
  Filled 2021-12-02: qty 30

## 2021-12-02 MED ORDER — SODIUM CHLORIDE (PF) 0.9 % IJ SOLN
INTRAMUSCULAR | Status: DC | PRN
  Administered 2021-12-02: 20 mL

## 2021-12-02 MED ORDER — BUPIVACAINE LIPOSOME 1.3 % IJ SUSP
INTRAMUSCULAR | Status: AC
  Filled 2021-12-02: qty 20

## 2021-12-02 MED ORDER — ALBUMIN HUMAN 5 % IV SOLN: INTRAVENOUS | Status: DC | PRN

## 2021-12-02 MED ORDER — FENTANYL CITRATE PF 50 MCG/ML IJ SOSY
25.0000 ug | PREFILLED_SYRINGE | INTRAMUSCULAR | Status: DC | PRN
  Administered 2021-12-03 (×2): 25 ug via INTRAVENOUS

## 2021-12-02 MED ORDER — CEFAZOLIN SODIUM-DEXTROSE 2-4 GM/100ML-% IV SOLN
INTRAVENOUS | Status: AC
  Filled 2021-12-02: qty 100

## 2021-12-02 MED ORDER — SODIUM CHLORIDE 0.9 % IV BOLUS
1000.0000 mL | Freq: Once | INTRAVENOUS | Status: AC
  Administered 2021-12-02: 11:00:00 1000 mL via INTRAVENOUS

## 2021-12-02 MED ORDER — ROCURONIUM BROMIDE 10 MG/ML (PF) SYRINGE
PREFILLED_SYRINGE | INTRAVENOUS | Status: AC
  Filled 2021-12-02: qty 10

## 2021-12-02 MED ORDER — LACTATED RINGERS IV BOLUS
500.0000 mL | Freq: Once | INTRAVENOUS | Status: DC
Start: 2021-12-02 — End: 2021-12-02

## 2021-12-02 MED ORDER — LACTATED RINGERS IV BOLUS
500.0000 mL | Freq: Once | INTRAVENOUS | Status: AC
  Administered 2021-12-02: 05:00:00 500 mL via INTRAVENOUS

## 2021-12-02 SURGICAL SUPPLY — 75 items
ADH SKN CLS APL DERMABOND .7 (GAUZE/BANDAGES/DRESSINGS)
AGENT HMST KT MTR STRL THRMB (HEMOSTASIS) ×1
APL PRP STRL LF DISP 70% ISPRP (MISCELLANEOUS) ×1
APPLIER CLIP 13 LRG OPEN (CLIP) ×2
APR CLP LRG 13 20 CLIP (CLIP) ×1
ATTRACTOMAT 16X20 MAGNETIC DRP (DRAPES) ×1 IMPLANT
BAG COUNTER SPONGE SURGICOUNT (BAG) IMPLANT
BAG SPNG CNTER NS LX DISP (BAG)
BLADE EXTENDED COATED 6.5IN (ELECTRODE) ×2 IMPLANT
CELLS DAT CNTRL 66122 CELL SVR (MISCELLANEOUS) IMPLANT
CHLORAPREP W/TINT 26 (MISCELLANEOUS) ×2 IMPLANT
CLIP APPLIE 13 LRG OPEN (CLIP) IMPLANT
CLIP TI LARGE 6 (CLIP) ×1 IMPLANT
CLIP TI MEDIUM 6 (CLIP) ×1 IMPLANT
CLIP TI MEDIUM LARGE 6 (CLIP) ×1 IMPLANT
CNTNR URN SCR LID CUP LEK RST (MISCELLANEOUS) IMPLANT
CONT SPEC 4OZ STRL OR WHT (MISCELLANEOUS)
DERMABOND ADVANCED (GAUZE/BANDAGES/DRESSINGS)
DERMABOND ADVANCED .7 DNX12 (GAUZE/BANDAGES/DRESSINGS) IMPLANT
DRAPE INCISE IOBAN 66X45 STRL (DRAPES) IMPLANT
DRAPE SURG IRRIG POUCH 19X23 (DRAPES) ×1 IMPLANT
DRAPE WARM FLUID 44X44 (DRAPES) ×2 IMPLANT
DRSG OPSITE POSTOP 4X10 (GAUZE/BANDAGES/DRESSINGS) ×1 IMPLANT
DRSG OPSITE POSTOP 4X6 (GAUZE/BANDAGES/DRESSINGS) IMPLANT
DRSG OPSITE POSTOP 4X8 (GAUZE/BANDAGES/DRESSINGS) IMPLANT
ELECT REM PT RETURN 15FT ADLT (MISCELLANEOUS) ×2 IMPLANT
GAUZE 4X4 16PLY ~~LOC~~+RFID DBL (SPONGE) IMPLANT
GLOVE SURG ENC MOIS LTX SZ6 (GLOVE) ×4 IMPLANT
GLOVE SURG ENC MOIS LTX SZ6.5 (GLOVE) ×4 IMPLANT
GOWN STRL REUS W/ TWL LRG LVL3 (GOWN DISPOSABLE) ×2 IMPLANT
GOWN STRL REUS W/TWL LRG LVL3 (GOWN DISPOSABLE) ×8
HEMOSTAT ARISTA ABSORB 3G PWDR (HEMOSTASIS) IMPLANT
KIT BASIN OR (CUSTOM PROCEDURE TRAY) ×2 IMPLANT
KIT TURNOVER KIT A (KITS) ×1 IMPLANT
LIGASURE IMPACT 36 18CM CVD LR (INSTRUMENTS) ×1 IMPLANT
LOOP VESSEL MAXI BLUE (MISCELLANEOUS) IMPLANT
NDL HYPO 21X1.5 SAFETY (NEEDLE) ×2 IMPLANT
NEEDLE HYPO 21X1.5 SAFETY (NEEDLE) ×4 IMPLANT
NS IRRIG 1000ML POUR BTL (IV SOLUTION) ×4 IMPLANT
PACK GENERAL/GYN (CUSTOM PROCEDURE TRAY) ×2 IMPLANT
RELOAD PROXIMATE 75MM BLUE (ENDOMECHANICALS) IMPLANT
RELOAD PROXIMATE TA60MM BLUE (ENDOMECHANICALS) IMPLANT
RELOAD STAPLE 60 BLU REG PROX (ENDOMECHANICALS) IMPLANT
RELOAD STAPLE 75 3.8 BLU REG (ENDOMECHANICALS) IMPLANT
RETRACTOR WND ALEXIS 18 MED (MISCELLANEOUS) IMPLANT
RETRACTOR WND ALEXIS 25 LRG (MISCELLANEOUS) IMPLANT
RTRCTR WOUND ALEXIS 18CM MED (MISCELLANEOUS)
RTRCTR WOUND ALEXIS 25CM LRG (MISCELLANEOUS)
SCRUB EXIDINE 4% CHG 4OZ (MISCELLANEOUS) ×2 IMPLANT
SHEET LAVH (DRAPES) ×2 IMPLANT
SLEEVE SUCTION CATH 165 (SLEEVE) ×2 IMPLANT
SPONGE T-LAP 18X18 ~~LOC~~+RFID (SPONGE) ×2 IMPLANT
STAPLER GUN LINEAR PROX 60 (STAPLE) IMPLANT
STAPLER PROXIMATE 75MM BLUE (STAPLE) IMPLANT
STAPLER VISISTAT 35W (STAPLE) ×1 IMPLANT
SURGIFLO W/THROMBIN 8M KIT (HEMOSTASIS) ×1 IMPLANT
SUT MNCRL AB 4-0 PS2 18 (SUTURE) ×3 IMPLANT
SUT PDS AB 1 TP1 96 (SUTURE) ×4 IMPLANT
SUT SILK 3 0 SH CR/8 (SUTURE) IMPLANT
SUT VIC AB 0 CT1 36 (SUTURE) ×4 IMPLANT
SUT VIC AB 2-0 CT1 27 (SUTURE) ×4
SUT VIC AB 2-0 CT1 36 (SUTURE) ×4 IMPLANT
SUT VIC AB 2-0 CT1 TAPERPNT 27 (SUTURE) ×2 IMPLANT
SUT VIC AB 2-0 CT2 27 (SUTURE) ×6 IMPLANT
SUT VIC AB 2-0 SH 27 (SUTURE)
SUT VIC AB 2-0 SH 27X BRD (SUTURE) IMPLANT
SUT VIC AB 3-0 CTX 36 (SUTURE) IMPLANT
SUT VIC AB 3-0 SH 18 (SUTURE) IMPLANT
SUT VIC AB 3-0 SH 27 (SUTURE)
SUT VIC AB 3-0 SH 27X BRD (SUTURE) ×1 IMPLANT
SYR 30ML LL (SYRINGE) ×4 IMPLANT
TOWEL OR 17X26 10 PK STRL BLUE (TOWEL DISPOSABLE) ×2 IMPLANT
TOWEL OR NON WOVEN STRL DISP B (DISPOSABLE) ×2 IMPLANT
TRAY FOLEY MTR SLVR 16FR STAT (SET/KITS/TRAYS/PACK) ×2 IMPLANT
UNDERPAD 30X36 HEAVY ABSORB (UNDERPADS AND DIAPERS) ×2 IMPLANT

## 2021-12-02 NOTE — Assessment & Plan Note (Addendum)
And hypotension this morning.  At this time, think this is dehydration, do not suspect sepsis. - IV fluids - Check CXR, blood cultures, urine   ADDENDUM: CXR equivocal, urine unremarkable.  But Procal 0.55.  Will start empiric rocephin, follow cultures, procal - Low threshold to de-escalate Abx

## 2021-12-02 NOTE — Progress Notes (Signed)
°  Progress Note   Patient: Yvonne [name] YQI:[id number] DOB: [birth year] DOA: 12/01/2021     1 DOS: the patient was seen and examined on 12/02/2021       Brief hospital course: Yvonne [name] is a 50 y.o. F with HTN, obesity who presented with lower abdominal pain, vomiting, feeling clammy.  In the ER, CTA abdomen and pelvis showed a large cystic mass associated with the right adnexa.  Incidentally COVID+.       Assessment and Plan * COVID-19 virus infection- (present on admission) -Continue Paxlovid  Leukocytosis- (present on admission) And hypotension this morning.  At this time, think this is dehydration, do not suspect sepsis. - IV fluids - Check CXR, blood cultures, urine   ADDENDUM: CXR equivocal, urine unremarkable.  But Procal 0.55.  Will start empiric rocephin, follow cultures, procal - Low threshold to de-escalate Abx  Ovarian mass, right- (present on admission) Discussed with patients' Gyn Dr. Garwin Brothers and Dr. Delsa Sale  Gyn-Onc.  They will discuss and one will come evaluate the patient. - Follow CA 19-9, CA125 and CEA  At present, patient unable to tolerate PO - Continue IV fluids - Clears - ADAT   Hypertension- (present on admission) -Hold HCTZ  Class 1 obesity without serious comorbidity with body mass index (BMI) of 34.0 to 34.9 in adult BMI 32  Depression- (present on admission) -Continue escitalopram     Subjective: Patient still with a lot of pain this afternoon.  Unable to tolerate anything except Clears.  No cough, dyspnea.  No sore throat, congestion.  No urinary irritative symptoms.  Objective Vital signs reviewed and remarkable for low blood pressure, afebrile. General appearance: Adult female, lying in bed, no acute distress,     HEENT: Oropharynx moist, no oral lesions, hearing normal Skin: Normal without suspicious rashes or lesions Cardiac: RRR, no murmurs, no lower extremity edema Respiratory: Normal respiratory rate and  rhythm, lungs clear without rales or wheezes Abdomen: Abdomen soft, marked lower abdominal tenderness, radiating from the top when I press on the top of the abdomen, voluntary guarding noted, no rigidity or rebound MSK: Normal muscle bulk and tone Neuro: Awake and alert, extraocular movements intact, moves all extremities normal strength and coordination, speech fluent Psych: Attention normal, affect normal, judgment insight appear normal    Data Reviewed: Electrolytes, blood count, CT abdomen and pelvis angiogram, pelvic ultrasound reviewed and summarized above.  Family Communication: Husband by phone  Disposition: Status is: Inpatient  Remains inpatient appropriate because: Patient unable to tolerate oral intake at present.  GYN or GYN oncology will evaluate the patient tomorrow and review her CA125.  From the [place] standpoint, she could be discharged.  From the standpoint of oral intake, she would need to tolerate solid food before she could be discharged.  GYN or GYN oncology will discuss the timing of surgery             Author: Edwin Dada 12/02/2021 7:06 PM  For on call review [email or web address].

## 2021-12-02 NOTE — Assessment & Plan Note (Addendum)
Discussed with patients' Gyn Dr. Garwin Brothers and Dr. Delsa Sale  Gyn-Onc.  They will discuss and one will come evaluate the patient. - Follow CA 19-9, CA125 and CEA  At present, patient unable to tolerate PO - Continue IV fluids - Clears - ADAT

## 2021-12-02 NOTE — Hospital Course (Signed)
Yvonne [name] is a 50 y.o. F with HTN, obesity who presented with lower abdominal pain, vomiting, feeling clammy.  In the ER, CTA abdomen and pelvis showed a large cystic mass associated with the right adnexa.  Incidentally COVID+.

## 2021-12-02 NOTE — TOC Progression Note (Signed)
Transition of Care [facility]) - Progression Note    Patient Details  Name: JAYLE SOLARZ MRN: [id number] Date of Birth: Aug 25, 1971  [facility]) CM/SW Contact  Purcell Mouton, RN Phone Number: 12/02/2021, 1:04 PM  Clinical Narrative:    Pt from home. TOC and [facility] will continue to follow for discharge needs.    Expected Discharge Plan: Home/Self Care Barriers to Discharge: No Barriers Identified  Expected Discharge Plan and Services Expected Discharge Plan: Home/Self Care       Living arrangements for the past 2 months: Single Family Home                                       Social Determinants of Health (SDOH) Interventions    Readmission Risk Interventions No flowsheet data found.

## 2021-12-02 NOTE — Consult Note (Signed)
Reason for Consult: Acute abdominal pain, pelvic mass Referring Physician: Lesle Reek, MD  Yvonne [name] is an 50 y.o. female.  HPI: She was in her USOH until several days ago.  She reports the onset of severe abdominal pain radiating from left to right and the suprapubic region.  She has associated anorexia.  The pain is relieved by emptying her bladder.  She denies any vaginal bleeding, abnormal discharge, fever, N/V/D.  She COVID positive; no URI sxs.  She is UTD w/routine screenings including MMG, colonoscopy.  No significant family h/o malignancies.   Past Medical History:  Diagnosis Date   Anemia    Arthritis    High blood pressure    Joint pain    Kidney stones    Microscopic hematuria    Vitamin D deficiency     Past Surgical History:  Procedure Laterality Date   NO PAST SURGERIES     Denies surgical history    Family History  Problem Relation Age of Onset   Healthy Father    Congestive Heart Failure Father    Heart disease Father    Diabetes Father    Other Mother        deceased from multiple myelomas   Cancer Mother    Cancer Maternal Grandmother    Multiple myeloma Maternal Grandmother    Cancer Maternal Grandfather        unsure what type of cancer   Prostate cancer Paternal Grandmother    Colon cancer Neg Hx    Esophageal cancer Neg Hx    Liver cancer Neg Hx    Pancreatic cancer Neg Hx    Rectal cancer Neg Hx    Stomach cancer Neg Hx     Social History:  reports that she has never smoked. She has never used smokeless tobacco. She reports that she does not drink alcohol and does not use drugs.  Allergies: No Known Allergies  Medications: I have reviewed the patient's current medications.  Results for orders placed or performed during the hospital encounter of 12/01/21 (from the past 48 hour(s))  CBC with Differential     Status: Abnormal   Collection Time: 12/01/21 12:43 PM  Result Value Ref Range   WBC 19.9 (H) 4.0 - 10.5 K/uL   RBC  4.54 3.87 - 5.11 MIL/uL   Hemoglobin 14.4 12.0 - 15.0 g/dL   HCT 43.1 36.0 - 46.0 %   MCV 94.9 80.0 - 100.0 fL   MCH 31.7 26.0 - 34.0 pg   MCHC 33.4 30.0 - 36.0 g/dL   RDW 14.4 11.5 - 15.5 %   Platelets 283 150 - 400 K/uL   nRBC 0.0 0.0 - 0.2 %   Neutrophils Relative % 85 %   Neutro Abs 16.9 (H) 1.7 - 7.7 K/uL   Lymphocytes Relative 7 %   Lymphs Abs 1.4 0.7 - 4.0 K/uL   Monocytes Relative 7 %   Monocytes Absolute 1.4 (H) 0.1 - 1.0 K/uL   Eosinophils Relative 0 %   Eosinophils Absolute 0.0 0.0 - 0.5 K/uL   Basophils Relative 0 %   Basophils Absolute 0.1 0.0 - 0.1 K/uL   Immature Granulocytes 1 %   Abs Immature Granulocytes 0.13 (H) 0.00 - 0.07 K/uL    Comment: Performed at [facility], [place] [address] [place], [place] [address]  Comprehensive metabolic panel     Status: Abnormal   Collection Time: 12/01/21 12:43 PM  Result Value Ref Range   Sodium 135  135 - 145 mmol/L   Potassium 3.9 3.5 - 5.1 mmol/L   Chloride 105 98 - 111 mmol/L   CO2 21 (L) 22 - 32 mmol/L   Glucose, Bld 125 (H) 70 - 99 mg/dL    Comment: Glucose reference range applies only to samples taken after fasting for at least 8 hours.   BUN 11 6 - 20 mg/dL   Creatinine, Ser 0.84 0.44 - 1.00 mg/dL   Calcium 9.9 8.9 - 10.3 mg/dL   Total Protein 7.4 6.5 - 8.1 g/dL   Albumin 3.9 3.5 - 5.0 g/dL   AST 25 15 - 41 U/L   ALT 71 (H) 0 - 44 U/L   Alkaline Phosphatase 61 38 - 126 U/L   Total Bilirubin 0.9 0.3 - 1.2 mg/dL   GFR, Estimated >60 >60 mL/min    Comment: (NOTE) Calculated using the CKD-EPI Creatinine Equation (2021)    Anion gap 9 5 - 15    Comment: Performed at [facility], [place] [address] [place], [place] [address]  Lipase, blood     Status: None   Collection Time: 12/01/21 12:43 PM  Result Value Ref Range   Lipase 33 11 - 51 U/L    Comment: Performed at [facility], [place] [address] [place], [place] [address]  Lactic acid, plasma     Status: None    Collection Time: 12/01/21 12:43 PM  Result Value Ref Range   Lactic Acid, Venous 1.5 0.5 - 1.9 mmol/L    Comment: Performed at [facility], [place] [address], [place], [place] [address]  I-Stat beta hCG blood, ED     Status: None   Collection Time: 12/01/21 12:52 PM  Result Value Ref Range   I-stat hCG, quantitative <5.0 <5 mIU/mL   Comment 3            Comment:   GEST. AGE      CONC.  (mIU/mL)   <=1 WEEK        5 - 50     2 WEEKS       50 - 500     3 WEEKS       100 - 10,000     4 WEEKS     1,000 - 30,000        FEMALE AND NON-PREGNANT FEMALE:     LESS THAN 5 mIU/mL   Resp Panel by RT-PCR (Flu A&B, Covid) Nasopharyngeal Swab     Status: Abnormal   Collection Time: 12/01/21  5:25 PM   Specimen: Nasopharyngeal Swab; Nasopharyngeal(NP) swabs in vial transport medium  Result Value Ref Range   SARS Coronavirus 2 by RT PCR POSITIVE (A) NEGATIVE    Comment: (NOTE) SARS-CoV-2 target nucleic acids are DETECTED.  The SARS-CoV-2 RNA is generally detectable in upper respiratory specimens during the acute phase of infection. Positive results are indicative of the presence of the identified virus, but do not rule out bacterial infection or co-infection with other pathogens not detected by the test. Clinical correlation with patient history and other diagnostic information is necessary to determine patient infection status. The expected result is Negative.  Fact Sheet for Patients: EntrepreneurPulse.com.au  Fact Sheet for Healthcare Providers: IncredibleEmployment.be  This test is not yet approved or cleared by the [place] FDA and  has been authorized for detection and/or diagnosis of SARS-CoV-2 by FDA under an Emergency Use Authorization (EUA).  This EUA will remain in effect (meaning this test can be used)  for the duration of  the COVID-19 declaration under Section 564(b)(1) of the A ct, 21 U.S.C. section 360bbb-3(b)(1),  unless the authorization is terminated or revoked sooner.     Influenza A by PCR NEGATIVE NEGATIVE   Influenza B by PCR NEGATIVE NEGATIVE    Comment: (NOTE) The Xpert Xpress SARS-CoV-2/FLU/RSV plus assay is intended as an aid in the diagnosis of influenza from Nasopharyngeal swab specimens and should not be used as a sole basis for treatment. Nasal washings and aspirates are unacceptable for Xpert Xpress SARS-CoV-2/FLU/RSV testing.  Fact Sheet for Patients: EntrepreneurPulse.com.au  Fact Sheet for Healthcare Providers: IncredibleEmployment.be  This test is not yet approved or cleared by the [place] FDA and has been authorized for detection and/or diagnosis of SARS-CoV-2 by FDA under an Emergency Use Authorization (EUA). This EUA will remain in effect (meaning this test can be used) for the duration of the COVID-19 declaration under Section 564(b)(1) of the Act, 21 U.S.C. section 360bbb-3(b)(1), unless the authorization is terminated or revoked.  Performed at [facility], [place] [address], [place], [place] [address]   HIV Antibody (routine testing w rflx)     Status: None   Collection Time: 12/01/21 11:25 PM  Result Value Ref Range   HIV Screen 4th Generation wRfx Non Reactive Non Reactive    Comment: Performed at [facility], [place] [address] [place], [place] [address]  Urinalysis, Routine w reflex microscopic Urine, Clean Catch     Status: Abnormal   Collection Time: 12/01/21 11:45 PM  Result Value Ref Range   Color, Urine YELLOW (A) YELLOW   APPearance CLEAR (A) CLEAR   Specific Gravity, Urine 1.015 1.005 - 1.030   pH 6.0 5.0 - 8.0   Glucose, UA NEGATIVE NEGATIVE mg/dL   Hgb urine dipstick MODERATE (A) NEGATIVE   Bilirubin Urine NEGATIVE NEGATIVE   Ketones, ur 40 (A) NEGATIVE mg/dL   Protein, ur TRACE (A) NEGATIVE mg/dL   Nitrite NEGATIVE NEGATIVE   Leukocytes,Ua NEGATIVE NEGATIVE   RBC / HPF 11-20 0 -  5 RBC/hpf   WBC, UA 0-5 0 - 5 WBC/hpf   Bacteria, UA NONE SEEN NONE SEEN   Squamous Epithelial / LPF 0-5 0 - 5   Mucus PRESENT    Hyaline Casts, UA PRESENT     Comment: Performed at [facility], [place] [address] [place], [place] [address]  Basic metabolic panel     Status: Abnormal   Collection Time: 12/02/21  3:40 AM  Result Value Ref Range   Sodium 136 135 - 145 mmol/L   Potassium 4.2 3.5 - 5.1 mmol/L   Chloride 105 98 - 111 mmol/L   CO2 21 (L) 22 - 32 mmol/L   Glucose, Bld 168 (H) 70 - 99 mg/dL    Comment: Glucose reference range applies only to samples taken after fasting for at least 8 hours.   BUN 18 6 - 20 mg/dL   Creatinine, Ser 1.57 (H) 0.44 - 1.00 mg/dL   Calcium 9.2 8.9 - 10.3 mg/dL   GFR, Estimated 40 (L) >60 mL/min    Comment: (NOTE) Calculated using the CKD-EPI Creatinine Equation (2021)    Anion gap 10 5 - 15    Comment: Performed at [facility], [place] [address] [place], [place] [address]  CBC     Status: Abnormal   Collection Time: 12/02/21  3:40 AM  Result Value Ref Range   WBC 24.4 (H) 4.0 - 10.5 K/uL  RBC 4.04 3.87 - 5.11 MIL/uL   Hemoglobin 12.7 12.0 - 15.0 g/dL   HCT 38.1 36.0 - 46.0 %   MCV 94.3 80.0 - 100.0 fL   MCH 31.4 26.0 - 34.0 pg   MCHC 33.3 30.0 - 36.0 g/dL   RDW 14.5 11.5 - 15.5 %   Platelets 307 150 - 400 K/uL   nRBC 0.0 0.0 - 0.2 %    Comment: Performed at [facility], [place] [address] [place], [place] [address]  Procalcitonin - Baseline     Status: None   Collection Time: 12/02/21  3:40 AM  Result Value Ref Range   Procalcitonin 0.55 ng/mL    Comment:        Interpretation: PCT > 0.5 ng/mL and <= 2 ng/mL: Systemic infection (sepsis) is possible, but other conditions are known to elevate PCT as well. (NOTE)       Sepsis PCT Algorithm           Lower Respiratory Tract                                      Infection PCT Algorithm    ----------------------------      ----------------------------         PCT < 0.25 ng/mL                PCT < 0.10 ng/mL          Strongly encourage             Strongly discourage   discontinuation of antibiotics    initiation of antibiotics    ----------------------------     -----------------------------       PCT 0.25 - 0.50 ng/mL            PCT 0.10 - 0.25 ng/mL               OR       >80% decrease in PCT            Discourage initiation of                                            antibiotics      Encourage discontinuation           of antibiotics    ----------------------------     -----------------------------         PCT >= 0.50 ng/mL              PCT 0.26 - 0.50 ng/mL                AND       <80% decrease in PCT             Encourage initiation of                                             antibiotics       Encourage continuation           of antibiotics    ----------------------------     -----------------------------        PCT >= 0.50 ng/mL  PCT > 0.50 ng/mL               AND         increase in PCT                  Strongly encourage                                      initiation of antibiotics    Strongly encourage escalation           of antibiotics                                     -----------------------------                                           PCT <= 0.25 ng/mL                                                 OR                                        > 80% decrease in PCT                                      Discontinue / Do not initiate                                             antibiotics  Performed at [place] [address] [place], [place] [address]   Lactic acid, plasma     Status: None   Collection Time: 12/02/21  9:28 AM  Result Value Ref Range   Lactic Acid, Venous 1.6 0.5 - 1.9 mmol/L    Comment: Performed at [facility], [place] [address] [place], [place] [address]  Urinalysis, Routine w reflex microscopic  Urine, Catheterized     Status: Abnormal   Collection Time: 12/02/21 10:58 AM  Result Value Ref Range   Color, Urine AMBER (A) YELLOW    Comment: BIOCHEMICALS MAY BE AFFECTED BY COLOR   APPearance CLOUDY (A) CLEAR   Specific Gravity, Urine 1.020 1.005 - 1.030   pH 6.0 5.0 - 8.0   Glucose, UA NEGATIVE NEGATIVE mg/dL   Hgb urine dipstick SMALL (A) NEGATIVE   Bilirubin Urine NEGATIVE NEGATIVE   Ketones, ur 5 (A) NEGATIVE mg/dL   Protein, ur 30 (A) NEGATIVE mg/dL   Nitrite NEGATIVE NEGATIVE   Leukocytes,Ua NEGATIVE NEGATIVE   RBC / HPF 6-10 0 - 5 RBC/hpf   WBC, UA 6-10 0 - 5 WBC/hpf   Bacteria, UA RARE (A) NONE SEEN   Squamous Epithelial / LPF 0-5 0 - 5   Mucus PRESENT    Hyaline Casts, UA PRESENT  Comment: Performed at [facility], [place] [address] [place], [place] [address]  CBC with Differential/Platelet     Status: Abnormal   Collection Time: 12/02/21  7:49 PM  Result Value Ref Range   WBC 22.6 (H) 4.0 - 10.5 K/uL   RBC 3.20 (L) 3.87 - 5.11 MIL/uL   Hemoglobin 10.3 (L) 12.0 - 15.0 g/dL   HCT 30.2 (L) 36.0 - 46.0 %   MCV 94.4 80.0 - 100.0 fL   MCH 32.2 26.0 - 34.0 pg   MCHC 34.1 30.0 - 36.0 g/dL   RDW 14.5 11.5 - 15.5 %   Platelets 263 150 - 400 K/uL   nRBC 0.0 0.0 - 0.2 %   Neutrophils Relative % 81 %   Neutro Abs 18.4 (H) 1.7 - 7.7 K/uL   Lymphocytes Relative 9 %   Lymphs Abs 2.0 0.7 - 4.0 K/uL   Monocytes Relative 9 %   Monocytes Absolute 2.0 (H) 0.1 - 1.0 K/uL   Eosinophils Relative 0 %   Eosinophils Absolute 0.0 0.0 - 0.5 K/uL   Basophils Relative 0 %   Basophils Absolute 0.0 0.0 - 0.1 K/uL   Immature Granulocytes 1 %   Abs Immature Granulocytes 0.15 (H) 0.00 - 0.07 K/uL    Comment: Performed at [facility], [place] [address], [place], [place] [address]    US Pelvis Complete  Result Date: 12/01/2021 CLINICAL DATA:  Pelvic pain with ovarian mass EXAM: TRANSABDOMINAL ULTRASOUND OF PELVIS DOPPLER ULTRASOUND OF OVARIES  TECHNIQUE: Transabdominal ultrasound examination of the pelvis was performed including evaluation of the uterus, ovaries, adnexal regions, and pelvic cul-de-sac. Color and duplex Doppler ultrasound was utilized to evaluate blood flow to the ovaries. COMPARISON:  CT 12/01/2021 FINDINGS: Uterus Measurements: 13 x 5.6 x 7.1 cm = volume: 270.9 mL. Possible small fibroid within the subserosal anterior uterine fundus measuring 7 mm. Endometrium Thickness: 3.5 mm.  No focal abnormality visualized. Right ovary Measurements: 2.3 x 1.7 x 1.8 cm = volume: 3.7 mL. Solid adnexal mass adjacent to or a exophytic to right ovary measuring 5.5 x 4.3 x 4.8 cm. Left ovary Measurements: 2.2 x 1.5 x 2.4 cm = volume: 4.5 mL. Normal appearance/no adnexal mass. Pulsed Doppler evaluation demonstrates normal low-resistance arterial and venous waveforms in both ovaries. Other: Small free fluid. Large cystic and solid left mid abdominal mass measuring 15.9 x 8.8 x 11.6 cm, corresponding to the large left abdominal mass on CT visualized at the level of umbilicus. IMPRESSION: 1. Solid 5.5 cm right adnexal mass adjacent to or exophytic from right ovary. On CT, this appears contiguous with a large cystic and solid mass within the left mid abdomen; on ultrasound this mass measures up to 15.9 cm. Findings are suspicious for adnexal or ovarian neoplasm. Surgical consultation is recommended. A small amount of normal right ovarian tissue is imaged and does not demonstrate evidence for torsion. Negative for left ovarian torsion. 2. Small uterine fibroid. Electronically Signed   By: Donavan Foil M.D.   On: 12/01/2021 20:21   [place] Art/Ven Flow Abd Pelv Doppler  Result Date: 12/01/2021 CLINICAL DATA:  Pelvic pain with ovarian mass EXAM: TRANSABDOMINAL ULTRASOUND OF PELVIS DOPPLER ULTRASOUND OF OVARIES TECHNIQUE: Transabdominal ultrasound examination of the pelvis was performed including evaluation of the uterus, ovaries, adnexal regions, and pelvic  cul-de-sac. Color and duplex Doppler ultrasound was utilized to evaluate blood flow to the ovaries. COMPARISON:  CT 12/01/2021 FINDINGS: Uterus Measurements: 13 x 5.6 x 7.1 cm = volume:  270.9 mL. Possible small fibroid within the subserosal anterior uterine fundus measuring 7 mm. Endometrium Thickness: 3.5 mm.  No focal abnormality visualized. Right ovary Measurements: 2.3 x 1.7 x 1.8 cm = volume: 3.7 mL. Solid adnexal mass adjacent to or a exophytic to right ovary measuring 5.5 x 4.3 x 4.8 cm. Left ovary Measurements: 2.2 x 1.5 x 2.4 cm = volume: 4.5 mL. Normal appearance/no adnexal mass. Pulsed Doppler evaluation demonstrates normal low-resistance arterial and venous waveforms in both ovaries. Other: Small free fluid. Large cystic and solid left mid abdominal mass measuring 15.9 x 8.8 x 11.6 cm, corresponding to the large left abdominal mass on CT visualized at the level of umbilicus. IMPRESSION: 1. Solid 5.5 cm right adnexal mass adjacent to or exophytic from right ovary. On CT, this appears contiguous with a large cystic and solid mass within the left mid abdomen; on ultrasound this mass measures up to 15.9 cm. Findings are suspicious for adnexal or ovarian neoplasm. Surgical consultation is recommended. A small amount of normal right ovarian tissue is imaged and does not demonstrate evidence for torsion. Negative for left ovarian torsion. 2. Small uterine fibroid. Electronically Signed   By: Donavan Foil M.D.   On: 12/01/2021 20:21   DG Chest Port 1 View  Result Date: 12/02/2021 CLINICAL DATA:  Sepsis EXAM: PORTABLE CHEST 1 VIEW COMPARISON:  None. FINDINGS: Patient is rotated to the right. The heart size and mediastinal contours are within normal limits. Mild opacity of the left costophrenic angle, lungs are otherwise clear. No large pleural effusion or evidence of pneumothorax. IMPRESSION: Mild opacity of the left costophrenic angle, possibly artifactual and related to patient rotation, although  infection or aspiration cannot be excluded. Electronically Signed   By: Yetta Glassman M.D.   On: 12/02/2021 09:47   CT Angio Abd/Pel W and/or Wo Contrast  Result Date: 12/01/2021 CLINICAL DATA:  Lower abdominal pain and nausea, evaluate for mesenteric ischemia EXAM: CTA ABDOMEN AND PELVIS WITHOUT AND WITH CONTRAST TECHNIQUE: Multidetector CT imaging of the abdomen and pelvis was performed using the standard protocol during bolus administration of intravenous contrast. Multiplanar reconstructed images and MIPs were obtained and reviewed to evaluate the vascular anatomy. CONTRAST:  132mL OMNIPAQUE IOHEXOL 350 MG/ML SOLN COMPARISON:  09/16/2005 FINDINGS: VASCULAR Normal contour and caliber of the abdominal aorta. No evidence of aneurysm, dissection, or other acute aortic pathology. No significant atherosclerosis. Standard branching pattern of the abdominal aorta, with solitary bilateral renal arteries. The branch vessel origins are patent, with specific attention to the mesenteric arteries. Review of the MIP images confirms the above findings. NON-VASCULAR Lower chest: No acute abnormality. Hepatobiliary: No focal liver abnormality is seen. No gallstones, gallbladder wall thickening, or biliary dilatation. Pancreas: Unremarkable. No pancreatic ductal dilatation or surrounding inflammatory changes. Spleen: Normal in size without focal abnormality. Adrenals/Urinary Tract: Adrenal glands are unremarkable. Kidneys are normal, without renal calculi, focal lesion, or hydronephrosis. Bladder is unremarkable. Stomach/Bowel: Stomach is within normal limits. Appendix appears normal. No evidence of bowel wall thickening, distention, or inflammatory changes. Lymphatic: No significant vascular findings are present. No enlarged abdominal or pelvic lymph nodes. Reproductive: There is a large, multicystic septated mass which although primarily located in the left hemiabdomen, appears to arise from the right ovary, measuring  17.5 x 11.1 x 9.4 cm (series 7, image 65, series 4, image 130). The left ovary and adjacent ovarian vessels appear to be distinctly separate from this mass. Normal uterus. Other: No abdominal wall hernia or abnormality. No abdominopelvic ascites.  Musculoskeletal: No acute or significant osseous findings. IMPRESSION: 1. There is a large, multicystic septated mass which although primarily located in the left hemiabdomen, appears to arise from the right ovary, measuring 17.5 x 11.1 x 9.4 cm. This is highly concerning for primary ovarian malignancy. 2. No evidence of lymphadenopathy or metastatic disease in the abdomen or pelvis. 3. Normal contour and caliber of the abdominal aorta. No evidence of aneurysm, dissection, or other acute aortic pathology. No significant atherosclerosis. The branch vessel origins are patent, with specific attention to the mesenteric arteries. Electronically Signed   By: Delanna Ahmadi M.D.   On: 12/01/2021 16:15    Review of Systems  Constitutional:  Positive for appetite change. Negative for fever and unexpected weight change.  HENT: Negative.    Respiratory: Negative.    Gastrointestinal:  Positive for abdominal pain. Negative for constipation, diarrhea, nausea and vomiting.  Genitourinary:  Negative for dysuria, vaginal bleeding and vaginal discharge.  Blood pressure 116/72, pulse 100, temperature 99.4 F (37.4 C), temperature source Oral, resp. rate 16, SpO2 96 %. Physical Exam Constitutional:      General: She is in acute distress.     Appearance: She is obese. She is not ill-appearing or toxic-appearing.  Cardiovascular:     Rate and Rhythm: Normal rate and regular rhythm.  Abdominal:     Palpations: Abdomen is soft.     Tenderness: There is abdominal tenderness in the suprapubic area, left upper quadrant and left lower quadrant. There is no guarding or rebound.  Neurological:     Mental Status: She is alert.    Assessment/Plan: Large, complex pelvic mass.  Acute  abdominal pain, associated leucocytosis.  Clinical scenario worrisome for a torsion.  DDx includes a bleed into the mass however there is no significant downward trend in the hgb/hct  >OR for diagnostic lap, possible exploratory laparotomy, USO and all indicated procedures  Lahoma Crocker 12/02/2021, 8:09 PM

## 2021-12-02 NOTE — Brief Op Note (Signed)
12/02/2021  11:45 PM  PATIENT:  Yvonne [name]  50 y.o. female  PRE-OPERATIVE DIAGNOSIS:  abdominal mass  POST-OPERATIVE DIAGNOSIS:  abdominal mass  PROCEDURE:  Procedure(s): EXPLORATORY LAPAROTOMY,REMOVAL OF RIGHT TUBE AND OVARY (N/A)  SURGEON:  Surgeon(s) and Role:    Lafonda Mosses, MD - Primary    * Lahoma Crocker, MD - Assisting  ANESTHESIA:   general  EBL:  500 mL   BLOOD ADMINISTERED:none  DRAINS: none   LOCAL MEDICATIONS USED:  MARCAINE     SPECIMEN:  right tube and ovary   DISPOSITION OF SPECIMEN:  PATHOLOGY  COUNTS:  YES  TOURNIQUET:  * No tourniquets in log *  DICTATION: .Note written in EPIC  PLAN OF CARE: Admit to inpatient   PATIENT DISPOSITION:  PACU - hemodynamically stable.   Delay start of Pharmacological VTE agent (>24hrs) due to surgical blood loss or risk of bleeding: no

## 2021-12-02 NOTE — Anesthesia Procedure Notes (Signed)
Procedure Name: Intubation Date/Time: 12/02/2021 10:04 PM Performed by: Lissa Morales, CRNA Pre-anesthesia Checklist: Patient identified, Emergency Drugs available, Suction available and Patient being monitored Patient Re-evaluated:Patient Re-evaluated prior to induction Oxygen Delivery Method: Circle system utilized Preoxygenation: Pre-oxygenation with 100% oxygen Induction Type: IV induction, Cricoid Pressure applied and Rapid sequence Laryngoscope Size: Mac and 4 Grade View: Grade II Tube type: Oral Tube size: 7.5 mm Number of attempts: 1 Airway Equipment and Method: Stylet and Oral airway Placement Confirmation: ETT inserted through vocal cords under direct vision, positive ETCO2 and breath sounds checked- equal and bilateral Secured at: 22 cm Tube secured with: Tape Dental Injury: Teeth and Oropharynx as per pre-operative assessment

## 2021-12-02 NOTE — Assessment & Plan Note (Signed)
-  Hold HCTZ

## 2021-12-02 NOTE — Assessment & Plan Note (Signed)
-  Continue Paxlovid

## 2021-12-02 NOTE — H&P (View-Only) (Signed)
Reason for Consult: Acute abdominal pain, pelvic mass Referring Physician: Lesle Reek, MD  Yvonne [name] is an 50 y.o. female.  HPI: She was in her USOH until several days ago.  She reports the onset of severe abdominal pain radiating from left to right and the suprapubic region.  She has associated anorexia.  The pain is relieved by emptying her bladder.  She denies any vaginal bleeding, abnormal discharge, fever, N/V/D.  She COVID positive; no URI sxs.  She is UTD w/routine screenings including MMG, colonoscopy.  No significant family h/o malignancies.   Past Medical History:  Diagnosis Date   Anemia    Arthritis    High blood pressure    Joint pain    Kidney stones    Microscopic hematuria    Vitamin D deficiency     Past Surgical History:  Procedure Laterality Date   NO PAST SURGERIES     Denies surgical history    Family History  Problem Relation Age of Onset   Healthy Father    Congestive Heart Failure Father    Heart disease Father    Diabetes Father    Other Mother        deceased from multiple myelomas   Cancer Mother    Cancer Maternal Grandmother    Multiple myeloma Maternal Grandmother    Cancer Maternal Grandfather        unsure what type of cancer   Prostate cancer Paternal Grandmother    Colon cancer Neg Hx    Esophageal cancer Neg Hx    Liver cancer Neg Hx    Pancreatic cancer Neg Hx    Rectal cancer Neg Hx    Stomach cancer Neg Hx     Social History:  reports that she has never smoked. She has never used smokeless tobacco. She reports that she does not drink alcohol and does not use drugs.  Allergies: No Known Allergies  Medications: I have reviewed the patient's current medications.  Results for orders placed or performed during the hospital encounter of 12/01/21 (from the past 48 hour(s))  CBC with Differential     Status: Abnormal   Collection Time: 12/01/21 12:43 PM  Result Value Ref Range   WBC 19.9 (H) 4.0 - 10.5 K/uL   RBC  4.54 3.87 - 5.11 MIL/uL   Hemoglobin 14.4 12.0 - 15.0 g/dL   HCT 43.1 36.0 - 46.0 %   MCV 94.9 80.0 - 100.0 fL   MCH 31.7 26.0 - 34.0 pg   MCHC 33.4 30.0 - 36.0 g/dL   RDW 14.4 11.5 - 15.5 %   Platelets 283 150 - 400 K/uL   nRBC 0.0 0.0 - 0.2 %   Neutrophils Relative % 85 %   Neutro Abs 16.9 (H) 1.7 - 7.7 K/uL   Lymphocytes Relative 7 %   Lymphs Abs 1.4 0.7 - 4.0 K/uL   Monocytes Relative 7 %   Monocytes Absolute 1.4 (H) 0.1 - 1.0 K/uL   Eosinophils Relative 0 %   Eosinophils Absolute 0.0 0.0 - 0.5 K/uL   Basophils Relative 0 %   Basophils Absolute 0.1 0.0 - 0.1 K/uL   Immature Granulocytes 1 %   Abs Immature Granulocytes 0.13 (H) 0.00 - 0.07 K/uL    Comment: Performed at [facility], [place] [address], [place], [place] [address]  Comprehensive metabolic panel     Status: Abnormal   Collection Time: 12/01/21 12:43 PM  Result Value Ref Range   Sodium 135  135 - 145 mmol/L   Potassium 3.9 3.5 - 5.1 mmol/L   Chloride 105 98 - 111 mmol/L   CO2 21 (L) 22 - 32 mmol/L   Glucose, Bld 125 (H) 70 - 99 mg/dL    Comment: Glucose reference range applies only to samples taken after fasting for at least 8 hours.   BUN 11 6 - 20 mg/dL   Creatinine, Ser 0.84 0.44 - 1.00 mg/dL   Calcium 9.9 8.9 - 10.3 mg/dL   Total Protein 7.4 6.5 - 8.1 g/dL   Albumin 3.9 3.5 - 5.0 g/dL   AST 25 15 - 41 U/L   ALT 71 (H) 0 - 44 U/L   Alkaline Phosphatase 61 38 - 126 U/L   Total Bilirubin 0.9 0.3 - 1.2 mg/dL   GFR, Estimated >60 >60 mL/min    Comment: (NOTE) Calculated using the CKD-EPI Creatinine Equation (2021)    Anion gap 9 5 - 15    Comment: Performed at [facility], [place] [address], [place], [place] [address]  Lipase, blood     Status: None   Collection Time: 12/01/21 12:43 PM  Result Value Ref Range   Lipase 33 11 - 51 U/L    Comment: Performed at [facility], [place] [address], [place], [place] [address]  Lactic acid, plasma     Status: None    Collection Time: 12/01/21 12:43 PM  Result Value Ref Range   Lactic Acid, Venous 1.5 0.5 - 1.9 mmol/L    Comment: Performed at [facility], [place] [address], [place], [place] [address]  I-Stat beta hCG blood, ED     Status: None   Collection Time: 12/01/21 12:52 PM  Result Value Ref Range   I-stat hCG, quantitative <5.0 <5 mIU/mL   Comment 3            Comment:   GEST. AGE      CONC.  (mIU/mL)   <=1 WEEK        5 - 50     2 WEEKS       50 - 500     3 WEEKS       100 - 10,000     4 WEEKS     1,000 - 30,000        FEMALE AND NON-PREGNANT FEMALE:     LESS THAN 5 mIU/mL   Resp Panel by RT-PCR (Flu A&B, Covid) Nasopharyngeal Swab     Status: Abnormal   Collection Time: 12/01/21  5:25 PM   Specimen: Nasopharyngeal Swab; Nasopharyngeal(NP) swabs in vial transport medium  Result Value Ref Range   SARS Coronavirus 2 by RT PCR POSITIVE (A) NEGATIVE    Comment: (NOTE) SARS-CoV-2 target nucleic acids are DETECTED.  The SARS-CoV-2 RNA is generally detectable in upper respiratory specimens during the acute phase of infection. Positive results are indicative of the presence of the identified virus, but do not rule out bacterial infection or co-infection with other pathogens not detected by the test. Clinical correlation with patient history and other diagnostic information is necessary to determine patient infection status. The expected result is Negative.  Fact Sheet for Patients: EntrepreneurPulse.com.au  Fact Sheet for Healthcare Providers: IncredibleEmployment.be  This test is not yet approved or cleared by the [place] FDA and  has been authorized for detection and/or diagnosis of SARS-CoV-2 by FDA under an Emergency Use Authorization (EUA).  This EUA will remain in effect (meaning this test can be used)  for the duration of  the COVID-19 declaration under Section 564(b)(1) of the A ct, 21 U.S.C. section 360bbb-3(b)(1),  unless the authorization is terminated or revoked sooner.     Influenza A by PCR NEGATIVE NEGATIVE   Influenza B by PCR NEGATIVE NEGATIVE    Comment: (NOTE) The Xpert Xpress SARS-CoV-2/FLU/RSV plus assay is intended as an aid in the diagnosis of influenza from Nasopharyngeal swab specimens and should not be used as a sole basis for treatment. Nasal washings and aspirates are unacceptable for Xpert Xpress SARS-CoV-2/FLU/RSV testing.  Fact Sheet for Patients: EntrepreneurPulse.com.au  Fact Sheet for Healthcare Providers: IncredibleEmployment.be  This test is not yet approved or cleared by the [place] FDA and has been authorized for detection and/or diagnosis of SARS-CoV-2 by FDA under an Emergency Use Authorization (EUA). This EUA will remain in effect (meaning this test can be used) for the duration of the COVID-19 declaration under Section 564(b)(1) of the Act, 21 U.S.C. section 360bbb-3(b)(1), unless the authorization is terminated or revoked.  Performed at [facility], [place] [address], [place], [place] [address]   HIV Antibody (routine testing w rflx)     Status: None   Collection Time: 12/01/21 11:25 PM  Result Value Ref Range   HIV Screen 4th Generation wRfx Non Reactive Non Reactive    Comment: Performed at [facility], [place] [address] [place], [place] [address]  Urinalysis, Routine w reflex microscopic Urine, Clean Catch     Status: Abnormal   Collection Time: 12/01/21 11:45 PM  Result Value Ref Range   Color, Urine YELLOW (A) YELLOW   APPearance CLEAR (A) CLEAR   Specific Gravity, Urine 1.015 1.005 - 1.030   pH 6.0 5.0 - 8.0   Glucose, UA NEGATIVE NEGATIVE mg/dL   Hgb urine dipstick MODERATE (A) NEGATIVE   Bilirubin Urine NEGATIVE NEGATIVE   Ketones, ur 40 (A) NEGATIVE mg/dL   Protein, ur TRACE (A) NEGATIVE mg/dL   Nitrite NEGATIVE NEGATIVE   Leukocytes,Ua NEGATIVE NEGATIVE   RBC / HPF 11-20 0 -  5 RBC/hpf   WBC, UA 0-5 0 - 5 WBC/hpf   Bacteria, UA NONE SEEN NONE SEEN   Squamous Epithelial / LPF 0-5 0 - 5   Mucus PRESENT    Hyaline Casts, UA PRESENT     Comment: Performed at [facility], [place] [address], [place], [place] [address]  Basic metabolic panel     Status: Abnormal   Collection Time: 12/02/21  3:40 AM  Result Value Ref Range   Sodium 136 135 - 145 mmol/L   Potassium 4.2 3.5 - 5.1 mmol/L   Chloride 105 98 - 111 mmol/L   CO2 21 (L) 22 - 32 mmol/L   Glucose, Bld 168 (H) 70 - 99 mg/dL    Comment: Glucose reference range applies only to samples taken after fasting for at least 8 hours.   BUN 18 6 - 20 mg/dL   Creatinine, Ser 1.57 (H) 0.44 - 1.00 mg/dL   Calcium 9.2 8.9 - 10.3 mg/dL   GFR, Estimated 40 (L) >60 mL/min    Comment: (NOTE) Calculated using the CKD-EPI Creatinine Equation (2021)    Anion gap 10 5 - 15    Comment: Performed at [facility], [place] [address], [place],  [address]  CBC     Status: Abnormal   Collection Time: 12/02/21  3:40 AM  Result Value Ref Range   WBC 24.4 (H) 4.0 - 10.5 K/uL  RBC 4.04 3.87 - 5.11 MIL/uL   Hemoglobin 12.7 12.0 - 15.0 g/dL   HCT 38.1 36.0 - 46.0 %   MCV 94.3 80.0 - 100.0 fL   MCH 31.4 26.0 - 34.0 pg   MCHC 33.3 30.0 - 36.0 g/dL   RDW 14.5 11.5 - 15.5 %   Platelets 307 150 - 400 K/uL   nRBC 0.0 0.0 - 0.2 %    Comment: Performed at [facility], [place] [address] [place], [place] [address]  Procalcitonin - Baseline     Status: None   Collection Time: 12/02/21  3:40 AM  Result Value Ref Range   Procalcitonin 0.55 ng/mL    Comment:        Interpretation: PCT > 0.5 ng/mL and <= 2 ng/mL: Systemic infection (sepsis) is possible, but other conditions are known to elevate PCT as well. (NOTE)       Sepsis PCT Algorithm           Lower Respiratory Tract                                      Infection PCT Algorithm    ----------------------------      ----------------------------         PCT < 0.25 ng/mL                PCT < 0.10 ng/mL          Strongly encourage             Strongly discourage   discontinuation of antibiotics    initiation of antibiotics    ----------------------------     -----------------------------       PCT 0.25 - 0.50 ng/mL            PCT 0.10 - 0.25 ng/mL               OR       >80% decrease in PCT            Discourage initiation of                                            antibiotics      Encourage discontinuation           of antibiotics    ----------------------------     -----------------------------         PCT >= 0.50 ng/mL              PCT 0.26 - 0.50 ng/mL                AND       <80% decrease in PCT             Encourage initiation of                                             antibiotics       Encourage continuation           of antibiotics    ----------------------------     -----------------------------        PCT >= 0.50 ng/mL  PCT > 0.50 ng/mL               AND         increase in PCT                  Strongly encourage                                      initiation of antibiotics    Strongly encourage escalation           of antibiotics                                     -----------------------------                                           PCT <= 0.25 ng/mL                                                 OR                                        > 80% decrease in PCT                                      Discontinue / Do not initiate                                             antibiotics  Performed at [place] [address], [place], [place] [address]   Lactic acid, plasma     Status: None   Collection Time: 12/02/21  9:28 AM  Result Value Ref Range   Lactic Acid, Venous 1.6 0.5 - 1.9 mmol/L    Comment: Performed at [facility], [place] [address], [place], [place] [address]  Urinalysis, Routine w reflex microscopic  Urine, Catheterized     Status: Abnormal   Collection Time: 12/02/21 10:58 AM  Result Value Ref Range   Color, Urine AMBER (A) YELLOW    Comment: BIOCHEMICALS MAY BE AFFECTED BY COLOR   APPearance CLOUDY (A) CLEAR   Specific Gravity, Urine 1.020 1.005 - 1.030   pH 6.0 5.0 - 8.0   Glucose, UA NEGATIVE NEGATIVE mg/dL   Hgb urine dipstick SMALL (A) NEGATIVE   Bilirubin Urine NEGATIVE NEGATIVE   Ketones, ur 5 (A) NEGATIVE mg/dL   Protein, ur 30 (A) NEGATIVE mg/dL   Nitrite NEGATIVE NEGATIVE   Leukocytes,Ua NEGATIVE NEGATIVE   RBC / HPF 6-10 0 - 5 RBC/hpf   WBC, UA 6-10 0 - 5 WBC/hpf   Bacteria, UA RARE (A) NONE SEEN   Squamous Epithelial / LPF 0-5 0 - 5   Mucus PRESENT    Hyaline Casts, UA PRESENT  Comment: Performed at [facility], [place] [address], [place], [place] [address]  CBC with Differential/Platelet     Status: Abnormal   Collection Time: 12/02/21  7:49 PM  Result Value Ref Range   WBC 22.6 (H) 4.0 - 10.5 K/uL   RBC 3.20 (L) 3.87 - 5.11 MIL/uL   Hemoglobin 10.3 (L) 12.0 - 15.0 g/dL   HCT 30.2 (L) 36.0 - 46.0 %   MCV 94.4 80.0 - 100.0 fL   MCH 32.2 26.0 - 34.0 pg   MCHC 34.1 30.0 - 36.0 g/dL   RDW 14.5 11.5 - 15.5 %   Platelets 263 150 - 400 K/uL   nRBC 0.0 0.0 - 0.2 %   Neutrophils Relative % 81 %   Neutro Abs 18.4 (H) 1.7 - 7.7 K/uL   Lymphocytes Relative 9 %   Lymphs Abs 2.0 0.7 - 4.0 K/uL   Monocytes Relative 9 %   Monocytes Absolute 2.0 (H) 0.1 - 1.0 K/uL   Eosinophils Relative 0 %   Eosinophils Absolute 0.0 0.0 - 0.5 K/uL   Basophils Relative 0 %   Basophils Absolute 0.0 0.0 - 0.1 K/uL   Immature Granulocytes 1 %   Abs Immature Granulocytes 0.15 (H) 0.00 - 0.07 K/uL    Comment: Performed at [facility], [place] [address], [place], [place] [address]    US Pelvis Complete  Result Date: 12/01/2021 CLINICAL DATA:  Pelvic pain with ovarian mass EXAM: TRANSABDOMINAL ULTRASOUND OF PELVIS DOPPLER ULTRASOUND OF OVARIES  TECHNIQUE: Transabdominal ultrasound examination of the pelvis was performed including evaluation of the uterus, ovaries, adnexal regions, and pelvic cul-de-sac. Color and duplex Doppler ultrasound was utilized to evaluate blood flow to the ovaries. COMPARISON:  CT 12/01/2021 FINDINGS: Uterus Measurements: 13 x 5.6 x 7.1 cm = volume: 270.9 mL. Possible small fibroid within the subserosal anterior uterine fundus measuring 7 mm. Endometrium Thickness: 3.5 mm.  No focal abnormality visualized. Right ovary Measurements: 2.3 x 1.7 x 1.8 cm = volume: 3.7 mL. Solid adnexal mass adjacent to or a exophytic to right ovary measuring 5.5 x 4.3 x 4.8 cm. Left ovary Measurements: 2.2 x 1.5 x 2.4 cm = volume: 4.5 mL. Normal appearance/no adnexal mass. Pulsed Doppler evaluation demonstrates normal low-resistance arterial and venous waveforms in both ovaries. Other: Small free fluid. Large cystic and solid left mid abdominal mass measuring 15.9 x 8.8 x 11.6 cm, corresponding to the large left abdominal mass on CT visualized at the level of umbilicus. IMPRESSION: 1. Solid 5.5 cm right adnexal mass adjacent to or exophytic from right ovary. On CT, this appears contiguous with a large cystic and solid mass within the left mid abdomen; on ultrasound this mass measures up to 15.9 cm. Findings are suspicious for adnexal or ovarian neoplasm. Surgical consultation is recommended. A small amount of normal right ovarian tissue is imaged and does not demonstrate evidence for torsion. Negative for left ovarian torsion. 2. Small uterine fibroid. Electronically Signed   By: Donavan Foil M.D.   On: 12/01/2021 20:21   [place] Art/Ven Flow Abd Pelv Doppler  Result Date: 12/01/2021 CLINICAL DATA:  Pelvic pain with ovarian mass EXAM: TRANSABDOMINAL ULTRASOUND OF PELVIS DOPPLER ULTRASOUND OF OVARIES TECHNIQUE: Transabdominal ultrasound examination of the pelvis was performed including evaluation of the uterus, ovaries, adnexal regions, and pelvic  cul-de-sac. Color and duplex Doppler ultrasound was utilized to evaluate blood flow to the ovaries. COMPARISON:  CT 12/01/2021 FINDINGS: Uterus Measurements: 13 x 5.6 x 7.1 cm = volume:  270.9 mL. Possible small fibroid within the subserosal anterior uterine fundus measuring 7 mm. Endometrium Thickness: 3.5 mm.  No focal abnormality visualized. Right ovary Measurements: 2.3 x 1.7 x 1.8 cm = volume: 3.7 mL. Solid adnexal mass adjacent to or a exophytic to right ovary measuring 5.5 x 4.3 x 4.8 cm. Left ovary Measurements: 2.2 x 1.5 x 2.4 cm = volume: 4.5 mL. Normal appearance/no adnexal mass. Pulsed Doppler evaluation demonstrates normal low-resistance arterial and venous waveforms in both ovaries. Other: Small free fluid. Large cystic and solid left mid abdominal mass measuring 15.9 x 8.8 x 11.6 cm, corresponding to the large left abdominal mass on CT visualized at the level of umbilicus. IMPRESSION: 1. Solid 5.5 cm right adnexal mass adjacent to or exophytic from right ovary. On CT, this appears contiguous with a large cystic and solid mass within the left mid abdomen; on ultrasound this mass measures up to 15.9 cm. Findings are suspicious for adnexal or ovarian neoplasm. Surgical consultation is recommended. A small amount of normal right ovarian tissue is imaged and does not demonstrate evidence for torsion. Negative for left ovarian torsion. 2. Small uterine fibroid. Electronically Signed   By: Donavan Foil M.D.   On: 12/01/2021 20:21   DG Chest Port 1 View  Result Date: 12/02/2021 CLINICAL DATA:  Sepsis EXAM: PORTABLE CHEST 1 VIEW COMPARISON:  None. FINDINGS: Patient is rotated to the right. The heart size and mediastinal contours are within normal limits. Mild opacity of the left costophrenic angle, lungs are otherwise clear. No large pleural effusion or evidence of pneumothorax. IMPRESSION: Mild opacity of the left costophrenic angle, possibly artifactual and related to patient rotation, although  infection or aspiration cannot be excluded. Electronically Signed   By: Yetta Glassman M.D.   On: 12/02/2021 09:47   CT Angio Abd/Pel W and/or Wo Contrast  Result Date: 12/01/2021 CLINICAL DATA:  Lower abdominal pain and nausea, evaluate for mesenteric ischemia EXAM: CTA ABDOMEN AND PELVIS WITHOUT AND WITH CONTRAST TECHNIQUE: Multidetector CT imaging of the abdomen and pelvis was performed using the standard protocol during bolus administration of intravenous contrast. Multiplanar reconstructed images and MIPs were obtained and reviewed to evaluate the vascular anatomy. CONTRAST:  176mL OMNIPAQUE IOHEXOL 350 MG/ML SOLN COMPARISON:  09/16/2005 FINDINGS: VASCULAR Normal contour and caliber of the abdominal aorta. No evidence of aneurysm, dissection, or other acute aortic pathology. No significant atherosclerosis. Standard branching pattern of the abdominal aorta, with solitary bilateral renal arteries. The branch vessel origins are patent, with specific attention to the mesenteric arteries. Review of the MIP images confirms the above findings. NON-VASCULAR Lower chest: No acute abnormality. Hepatobiliary: No focal liver abnormality is seen. No gallstones, gallbladder wall thickening, or biliary dilatation. Pancreas: Unremarkable. No pancreatic ductal dilatation or surrounding inflammatory changes. Spleen: Normal in size without focal abnormality. Adrenals/Urinary Tract: Adrenal glands are unremarkable. Kidneys are normal, without renal calculi, focal lesion, or hydronephrosis. Bladder is unremarkable. Stomach/Bowel: Stomach is within normal limits. Appendix appears normal. No evidence of bowel wall thickening, distention, or inflammatory changes. Lymphatic: No significant vascular findings are present. No enlarged abdominal or pelvic lymph nodes. Reproductive: There is a large, multicystic septated mass which although primarily located in the left hemiabdomen, appears to arise from the right ovary, measuring  17.5 x 11.1 x 9.4 cm (series 7, image 65, series 4, image 130). The left ovary and adjacent ovarian vessels appear to be distinctly separate from this mass. Normal uterus. Other: No abdominal wall hernia or abnormality. No abdominopelvic ascites.  Musculoskeletal: No acute or significant osseous findings. IMPRESSION: 1. There is a large, multicystic septated mass which although primarily located in the left hemiabdomen, appears to arise from the right ovary, measuring 17.5 x 11.1 x 9.4 cm. This is highly concerning for primary ovarian malignancy. 2. No evidence of lymphadenopathy or metastatic disease in the abdomen or pelvis. 3. Normal contour and caliber of the abdominal aorta. No evidence of aneurysm, dissection, or other acute aortic pathology. No significant atherosclerosis. The branch vessel origins are patent, with specific attention to the mesenteric arteries. Electronically Signed   By: Delanna Ahmadi M.D.   On: 12/01/2021 16:15    Review of Systems  Constitutional:  Positive for appetite change. Negative for fever and unexpected weight change.  HENT: Negative.    Respiratory: Negative.    Gastrointestinal:  Positive for abdominal pain. Negative for constipation, diarrhea, nausea and vomiting.  Genitourinary:  Negative for dysuria, vaginal bleeding and vaginal discharge.  Blood pressure 116/72, pulse 100, temperature 99.4 F (37.4 C), temperature source Oral, resp. rate 16, SpO2 96 %. Physical Exam Constitutional:      General: She is in acute distress.     Appearance: She is obese. She is not ill-appearing or toxic-appearing.  Cardiovascular:     Rate and Rhythm: Normal rate and regular rhythm.  Abdominal:     Palpations: Abdomen is soft.     Tenderness: There is abdominal tenderness in the suprapubic area, left upper quadrant and left lower quadrant. There is no guarding or rebound.  Neurological:     Mental Status: She is alert.    Assessment/Plan: Large, complex pelvic mass.  Acute  abdominal pain, associated leucocytosis.  Clinical scenario worrisome for a torsion.  DDx includes a bleed into the mass however there is no significant downward trend in the hgb/hct  >OR for diagnostic lap, possible exploratory laparotomy, USO and all indicated procedures  Lahoma Crocker 12/02/2021, 8:09 PM

## 2021-12-02 NOTE — Consult Note (Signed)
OB/GYN Primary  Chart reviewed. Pt known to me Cc: abdominal pain  HPI: 50 yo MBF admitted last night due to LAP with finding of  Large  left 15.9 cm complex ovarian cyst and  a 5 cm solid right adnexal mass on CT scan and pelvic sonogram with associated Leukocytosis.  Pt reports usual state of health until Christmas day When she developed acute onset of lower abdominal pain. Per pt she has only been able to tolerate clear liquids Denies n/v. Had taken NSAID x 2 w/o relief of sx.  Reviewed labs, CT scan and Ultrasound. Per pt, she is needing pain med as prescribed   PMH: all: NKDA  Med: lexapro. OCP Surgery:none OB hx SVD x 3, SAB x 1  FH. No fhx ovarian cancer/colon/breast cancer  SH: married. Works in [employer]:  denies urinary sx  PE: BP 116/72 (BP Location: Right Arm)    Pulse 100    Temp 99.4 F (37.4 C) (Oral)    Resp 16    SpO2 96%   General; WDWN obese BF not in acute distress currently( just had pain med)  Lungs clear to A Cor RRR  Abd; obese soft tender suprapubic and left  to righ lower abdomen  No  rebound tenderness  Palp mass on left Pelvic deferred  Ext no calf tenderness or edema  US Pelvis Complete  Result Date: 12/01/2021 CLINICAL DATA:  Pelvic pain with ovarian mass EXAM: TRANSABDOMINAL ULTRASOUND OF PELVIS DOPPLER ULTRASOUND OF OVARIES TECHNIQUE: Transabdominal ultrasound examination of the pelvis was performed including evaluation of the uterus, ovaries, adnexal regions, and pelvic cul-de-sac. Color and duplex Doppler ultrasound was utilized to evaluate blood flow to the ovaries. COMPARISON:  CT 12/01/2021 FINDINGS: Uterus Measurements: 13 x 5.6 x 7.1 cm = volume: 270.9 mL. Possible small fibroid within the subserosal anterior uterine fundus measuring 7 mm. Endometrium Thickness: 3.5 mm.  No focal abnormality visualized. Right ovary Measurements: 2.3 x 1.7 x 1.8 cm = volume: 3.7 mL. Solid adnexal mass adjacent to or a exophytic to right ovary measuring 5.5 x 4.3  x 4.8 cm. Left ovary Measurements: 2.2 x 1.5 x 2.4 cm = volume: 4.5 mL. Normal appearance/no adnexal mass. Pulsed Doppler evaluation demonstrates normal low-resistance arterial and venous waveforms in both ovaries. Other: Small free fluid. Large cystic and solid left mid abdominal mass measuring 15.9 x 8.8 x 11.6 cm, corresponding to the large left abdominal mass on CT visualized at the level of umbilicus. IMPRESSION: 1. Solid 5.5 cm right adnexal mass adjacent to or exophytic from right ovary. On CT, this appears contiguous with a large cystic and solid mass within the left mid abdomen; on ultrasound this mass measures up to 15.9 cm. Findings are suspicious for adnexal or ovarian neoplasm. Surgical consultation is recommended. A small amount of normal right ovarian tissue is imaged and does not demonstrate evidence for torsion. Negative for left ovarian torsion. 2. Small uterine fibroid. Electronically Signed   By: Donavan Foil M.D.   On: 12/01/2021 20:21   [place] Art/Ven Flow Abd Pelv Doppler  Result Date: 12/01/2021 CLINICAL DATA:  Pelvic pain with ovarian mass EXAM: TRANSABDOMINAL ULTRASOUND OF PELVIS DOPPLER ULTRASOUND OF OVARIES TECHNIQUE: Transabdominal ultrasound examination of the pelvis was performed including evaluation of the uterus, ovaries, adnexal regions, and pelvic cul-de-sac. Color and duplex Doppler ultrasound was utilized to evaluate blood flow to the ovaries. COMPARISON:  CT 12/01/2021 FINDINGS: Uterus Measurements: 13 x 5.6 x 7.1 cm = volume: 270.9 mL. Possible  small fibroid within the subserosal anterior uterine fundus measuring 7 mm. Endometrium Thickness: 3.5 mm.  No focal abnormality visualized. Right ovary Measurements: 2.3 x 1.7 x 1.8 cm = volume: 3.7 mL. Solid adnexal mass adjacent to or a exophytic to right ovary measuring 5.5 x 4.3 x 4.8 cm. Left ovary Measurements: 2.2 x 1.5 x 2.4 cm = volume: 4.5 mL. Normal appearance/no adnexal mass. Pulsed Doppler evaluation demonstrates normal  low-resistance arterial and venous waveforms in both ovaries. Other: Small free fluid. Large cystic and solid left mid abdominal mass measuring 15.9 x 8.8 x 11.6 cm, corresponding to the large left abdominal mass on CT visualized at the level of umbilicus. IMPRESSION: 1. Solid 5.5 cm right adnexal mass adjacent to or exophytic from right ovary. On CT, this appears contiguous with a large cystic and solid mass within the left mid abdomen; on ultrasound this mass measures up to 15.9 cm. Findings are suspicious for adnexal or ovarian neoplasm. Surgical consultation is recommended. A small amount of normal right ovarian tissue is imaged and does not demonstrate evidence for torsion. Negative for left ovarian torsion. 2. Small uterine fibroid. Electronically Signed   By: Donavan Foil M.D.   On: 12/01/2021 20:21   DG Chest Port 1 View  Result Date: 12/02/2021 CLINICAL DATA:  Sepsis EXAM: PORTABLE CHEST 1 VIEW COMPARISON:  None. FINDINGS: Patient is rotated to the right. The heart size and mediastinal contours are within normal limits. Mild opacity of the left costophrenic angle, lungs are otherwise clear. No large pleural effusion or evidence of pneumothorax. IMPRESSION: Mild opacity of the left costophrenic angle, possibly artifactual and related to patient rotation, although infection or aspiration cannot be excluded. Electronically Signed   By: Yetta Glassman M.D.   On: 12/02/2021 09:47    IMP: abdominal pain Large complex ovarian cyst Right solid adnexal mass Disc the findings with pt. Given these findings and concomitant pain, will need surgical intervention now rather than as outpatient elective basis. Advised  have contacted Dr Delsa Sale for gyn oncology  in order to have pt patient get the proper treatment at the outset  if this is malignancy which is concerning given findings and age P) gyn onc referral.   Ovarian cancer tumor markers pending  Addendum. 2nd call made with the patient info being  given to Dr Delsa Sale who states she will see the patient this evening. Also ? Regarding status of patient for surgery tonight or in am In the event of possible surgery tonight. Will order T&S, CBC with diff. Hospitalist on call notified of plan and possible surgery. Pt made NPO and agree with plan for surgery.  Total encounter time: 50 mins including  in addtion to face to face time of patient visit as noted above, non face to face time ( obtaining and reviewing outside records) , coordinating care with gyn onc .

## 2021-12-02 NOTE — Assessment & Plan Note (Signed)
-  Continue escitalopram

## 2021-12-02 NOTE — Anesthesia Preprocedure Evaluation (Addendum)
Anesthesia Evaluation  Patient identified by MRN, date of birth, ID band Patient awake    Reviewed: Allergy & Precautions, NPO status , Patient's Chart, lab work & pertinent test results  History of Anesthesia Complications Negative for: history of anesthetic complications  Airway Mallampati: II  TM Distance: >3 FB Neck ROM: Full    Dental  (+) Dental Advisory Given, Teeth Intact   Pulmonary neg pulmonary ROS,    Pulmonary exam normal        Cardiovascular hypertension, Pt. on medications  Rhythm:Regular Rate:Tachycardia     Neuro/Psych PSYCHIATRIC DISORDERS Depression negative neurological ROS     GI/Hepatic Neg liver ROS,  Intra-abdominal mass    Endo/Other   Obesity Pre-DM   Renal/GU negative Renal ROS     Musculoskeletal  (+) Arthritis ,   Abdominal   Peds  Hematology  (+) anemia , Refused blood products: See plan.,  CBC      Component                Value               Date/Time                 WBC                      22.6 (H)            12/02/2021 1949           HGB                      10.3 (L)            12/02/2021 1949           HCT                      30.2 (L)            12/02/2021 1949             PLT                      263                 12/02/2021 1949             Anesthesia Other Findings   Reproductive/Obstetrics                            Anesthesia Physical Anesthesia Plan  ASA: 2  Anesthesia Plan: General   Post-op Pain Management: Tylenol PO (pre-op), Ketamine IV and Toradol IV (intra-op)   Induction: Intravenous and Rapid sequence  PONV Risk Score and Plan: 4 or greater and Treatment may vary due to age or medical condition, Ondansetron, Scopolamine patch - Pre-op, Midazolam and Dexamethasone  Airway Management Planned: Oral ETT  Additional Equipment: None  Intra-op Plan:   Post-operative Plan: Extubation in OR  Informed Consent: I have  reviewed the patients History and Physical, chart, labs and discussed the procedure including the risks, benefits and alternatives for the proposed anesthesia with the patient or authorized representative who has indicated his/her understanding and acceptance.     Dental advisory given  Plan Discussed with: CRNA and Anesthesiologist  Anesthesia Plan Comments: (Lengthy discussion had regarding blood products. Patient is accepting of albumin. Patient would prefer to avoid other blood products, but said in a  situation deemed life threatening by the anesthesiologist and/or surgeon, she would be accepting of RBCs and other blood products in order to save her life.)      Anesthesia Quick Evaluation

## 2021-12-02 NOTE — Interval H&P Note (Signed)
History and Physical Interval Note: Discussed imaging findings with patient. Despite mass being quite high in the abdomen, I agree it looks most c/w ovarian origin on ultrasound. Fast onset of symptoms suddenly yesterday are concerning for torsion. Discuss possibility of only dx lsc if findings concerning for PID (low suspicion despite leukocytosis).  12/02/2021 9:19 PM  Yvonne [name]  has presented today for surgery, with the diagnosis of abdominal mass.  The various methods of treatment have been discussed with the patient and family. After consideration of risks, benefits and other options for treatment, the patient has consented to: Diagnostic laparoscopy Possible exploratory laparotomy with USO, possible staging, any other indicated procedures  surgical intervention.  The patient's history has been reviewed, patient examined, no change in status, stable for surgery.  I have reviewed the patient's chart and labs.  Questions were answered to the patient's satisfaction.     Lafonda Mosses

## 2021-12-02 NOTE — Op Note (Signed)
OPERATIVE NOTE  Pre-operative Diagnosis: Adnexal mass, suspected torsion  Post-operative Diagnosis: same, ovarian torsion and necrosis of the ovary, hemoperitoneum (350cc)  Operation: Diagnostic laparoscopy, ex-lap with abdominal exploration, right salpingo-oophorectomy   Surgeon: Jeral Pinch MD  Assistant Surgeon: Lahoma Crocker MD (an MD assistant was necessary for tissue manipulation, management of robotic instrumentation, retraction and positioning due to the complexity of the case and hospital policies).   Anesthesia: GET  Urine Output: see I&O flowsheet  Operative Findings: On EUA, large mobile mass sitting out of the cul de sac. On intra-abdominal entry, hemoperitoneum noted. Normal upper abdominal survey. Uterus 8-10cm and normal. Normal appearing left adnexa. Right adnexa notable for necrotic, edematous and friable ovary and fallopian tube with evidence of torsion x2. Small bowel somewhat dilated but normal in appearance.Normal large bowel. No adenopathy. Normal omentum. No evidence of metastatic disease. Decision made not to send mass for frozen given significant necrosis and concern for no viable tissue.  Estimated Blood Loss:  500 (150cc from surgery, 350cc hemoperitoneum)      Total IV Fluids: 3L crystalloid, 500cc 5% albumin         Specimens: right tube and ovary         Complications:  None apparent; patient tolerated the procedure well.         Disposition: PACU - hemodynamically stable.  Procedure Details  The patient was seen in the Holding Room. The risks, benefits, complications, treatment options, and expected outcomes were discussed with the patient.  The patient concurred with the proposed plan, giving informed consent.  The site of surgery properly noted/marked. The patient was identified as Yvonne [name] and the procedure verified as a dx lsc, unilateral salpingo-oophorectomy with any other indicated procedures.   After induction of anesthesia, the  patient was draped and prepped in the usual sterile manner. Patient was placed in supine position after anesthesia and draped and prepped in the usual sterile manner as follows: The patient was placed in the semi-lithotomy position in [place].  The perineum and vagina were prepped with CholoraPrep. The patient was draped after the CholoraPrep had been allowed to dry for 3 minutes.  A Time Out was held and the above information confirmed.  The urethra was prepped with Betadine. Foley catheter was placed. OG tube placement was confirmed and to suction.   Next, a 5 mm skin incision was made 1 cm below the subcostal margin in the midclavicular line.  The 5 mm Optiview port and scope was used for direct entry.  Opening pressure was under 10 mm CO2.  The abdomen was insufflated and the findings were noted as above.    At this point, the decision was made to convert to an open procedure.   A para median incision was then made with a scalpel and carried down to the fascia with monopolar electrocautery.  The fascia was incised, and the peritoneum entered bluntly.  The gas was then turned off, and the peritoneal incision was extended.  Hemoperitoneum was encountered.  The abdomen was explored manually and visually with findings noted above.  Attention was turned to the right adnexa, and a portion, suspected to be part of the ovary, fractured off given necrotic tissue.  This was handed off the field.  Bleeding was noted from the pelvis at this time and the pelvis was packed so that the [place] could be set up and the small bowel packed in the upper abdomen.  The right IP ligament was identified and a  window was made just under the vasculature, dropping the ureter, which was not visualized but manually palpated deep in the pelvis.  Window was stretched and LigaSure was used to cauterize and transect the IP ligament on the right.  Given necrotic and edematous ligament.  The proximal and was suture-ligated x2.   Bleeding was still noted from portion of the stump and several large clips were placed to achieve hemostasis.  The utero-ovarian and fallopian tube were then cauterized and transected adjacent to the uterine fundus on the right, freeing the remaining portion of the necrotic fallopian tube and ovary.  This was handed off the field.  Pelvis was irrigated and hemostasis was noted.  Floseal was placed in the surgical bed.  Suction irrigator was used to remove remaining hemoperitoneum from the upper abdomen.  Abdomen was irrigated again.  At this point in the procedure was completed.  The fascia was closed with running #1 looped PDS tied at the midline. The subcutaneous tissue was irrigated and hemostasis achieved. Exparel was injected for local anesthesia. The subcuticular tissue was reapproximated with 2-0 Vicryl. The skin was closed with staples. Honey dressing was applied.  The 5 mm LUQ incision was closed with 4-0 Monocryl in a subcuticular manner.  Dermabond was applied.    All sponge, lap and needle counts were correct x  3.   The patient was transferred to the recovery room in stable condition.  Jeral Pinch, MD

## 2021-12-02 NOTE — Assessment & Plan Note (Signed)
BMI 32 

## 2021-12-03 ENCOUNTER — Encounter (HOSPITAL_COMMUNITY): Payer: Self-pay | Admitting: Gynecologic Oncology

## 2021-12-03 DIAGNOSIS — R1900 Intra-abdominal and pelvic swelling, mass and lump, unspecified site: Secondary | ICD-10-CM | POA: Diagnosis present

## 2021-12-03 DIAGNOSIS — N83519 Torsion of ovary and ovarian pedicle, unspecified side: Secondary | ICD-10-CM | POA: Diagnosis not present

## 2021-12-03 LAB — CA 125: Cancer Antigen (CA) 125: 17.3 U/mL (ref 0.0–38.1)

## 2021-12-03 LAB — URINE CULTURE: Culture: 20000 — AB

## 2021-12-03 LAB — BASIC METABOLIC PANEL
Anion gap: 8 (ref 5–15)
Anion gap: 3 — ABNORMAL LOW (ref 5–15)
BUN: 30 mg/dL — ABNORMAL HIGH (ref 6–20)
BUN: 25 mg/dL — ABNORMAL HIGH (ref 6–20)
CO2: 18 mmol/L — ABNORMAL LOW (ref 22–32)
CO2: 21 mmol/L — ABNORMAL LOW (ref 22–32)
Calcium: 8 mg/dL — ABNORMAL LOW (ref 8.9–10.3)
Calcium: 7.9 mg/dL — ABNORMAL LOW (ref 8.9–10.3)
Chloride: 107 mmol/L (ref 98–111)
Chloride: 110 mmol/L (ref 98–111)
Creatinine, Ser: 1.92 mg/dL — ABNORMAL HIGH (ref 0.44–1.00)
Creatinine, Ser: 1.61 mg/dL — ABNORMAL HIGH (ref 0.44–1.00)
GFR, Estimated: 32 mL/min — ABNORMAL LOW (ref >60)
GFR, Estimated: 39 mL/min — ABNORMAL LOW (ref >60)
Glucose, Bld: 132 mg/dL — ABNORMAL HIGH (ref 70–99)
Glucose, Bld: 121 mg/dL — ABNORMAL HIGH (ref 70–99)
Potassium: 4.1 mmol/L (ref 3.5–5.1)
Potassium: 4 mmol/L (ref 3.5–5.1)
Sodium: 133 mmol/L — ABNORMAL LOW (ref 135–145)
Sodium: 134 mmol/L — ABNORMAL LOW (ref 135–145)

## 2021-12-03 LAB — TYPE AND SCREEN
ABO/RH(D): B POS
Antibody Screen: NEGATIVE

## 2021-12-03 LAB — CBC
HCT: 29.1 % — ABNORMAL LOW (ref 36.0–46.0)
Hemoglobin: 9.8 g/dL — ABNORMAL LOW (ref 12.0–15.0)
MCH: 31.9 pg (ref 26.0–34.0)
MCHC: 33.7 g/dL (ref 30.0–36.0)
MCV: 94.8 fL (ref 80.0–100.0)
Platelets: 247 10*3/uL (ref 150–400)
RBC: 3.07 MIL/uL — ABNORMAL LOW (ref 3.87–5.11)
RDW: 14.4 % (ref 11.5–15.5)
WBC: 17.5 10*3/uL — ABNORMAL HIGH (ref 4.0–10.5)
nRBC: 0 % (ref 0.0–0.2)

## 2021-12-03 LAB — CEA: CEA: 3.1 ng/mL (ref 0.0–4.7)

## 2021-12-03 LAB — CANCER ANTIGEN 19-9: CA 19-9: 53 U/mL — ABNORMAL HIGH (ref 0–35)

## 2021-12-03 LAB — SURGICAL PCR SCREEN
MRSA, PCR: NEGATIVE
Staphylococcus aureus: NEGATIVE

## 2021-12-03 MED ORDER — HYDROMORPHONE HCL 1 MG/ML IJ SOLN
0.5000 mg | INTRAMUSCULAR | Status: DC | PRN
  Administered 2021-12-03 – 2021-12-06 (×14): 0.5 mg via INTRAVENOUS
  Filled 2021-12-03 (×14): qty 0.5

## 2021-12-03 MED ORDER — PREGABALIN 75 MG PO CAPS
75.0000 mg | ORAL_CAPSULE | Freq: Two times a day (BID) | ORAL | Status: DC
  Administered 2021-12-03: 02:00:00 75 mg via ORAL
  Filled 2021-12-03: qty 1

## 2021-12-03 MED ORDER — SENNOSIDES-DOCUSATE SODIUM 8.6-50 MG PO TABS
2.0000 | ORAL_TABLET | Freq: Every day | ORAL | Status: DC
  Administered 2021-12-03 – 2021-12-06 (×2): 2 via ORAL
  Filled 2021-12-03 (×3): qty 2

## 2021-12-03 MED ORDER — ACETAMINOPHEN 500 MG PO TABS
1000.0000 mg | ORAL_TABLET | Freq: Four times a day (QID) | ORAL | Status: DC
  Administered 2021-12-03 – 2021-12-07 (×5): 1000 mg via ORAL
  Filled 2021-12-03 (×5): qty 2

## 2021-12-03 MED ORDER — TRAMADOL HCL 50 MG PO TABS
100.0000 mg | ORAL_TABLET | Freq: Four times a day (QID) | ORAL | Status: DC | PRN
  Administered 2021-12-03: 16:00:00 100 mg via ORAL
  Filled 2021-12-03: qty 2

## 2021-12-03 MED ORDER — FENTANYL CITRATE PF 50 MCG/ML IJ SOSY
PREFILLED_SYRINGE | INTRAMUSCULAR | Status: AC
  Filled 2021-12-03: qty 1

## 2021-12-03 MED ORDER — ESCITALOPRAM OXALATE 20 MG PO TABS
20.0000 mg | ORAL_TABLET | Freq: Every day | ORAL | Status: DC
  Administered 2021-12-03 – 2021-12-07 (×2): 20 mg via ORAL
  Filled 2021-12-03 (×3): qty 1

## 2021-12-03 MED ORDER — CHEWING GUM (ORBIT) SUGAR FREE
1.0000 | CHEWING_GUM | Freq: Three times a day (TID) | ORAL | Status: AC
  Administered 2021-12-04 – 2021-12-05 (×4): 1 via ORAL
  Filled 2021-12-03: qty 1

## 2021-12-03 MED ORDER — KCL IN DEXTROSE-NACL 20-5-0.45 MEQ/L-%-% IV SOLN
INTRAVENOUS | Status: DC
  Filled 2021-12-03 (×9): qty 1000

## 2021-12-03 MED ORDER — PHENOL 1.4 % MT LIQD
1.0000 | OROMUCOSAL | Status: DC | PRN
  Filled 2021-12-03 (×2): qty 177

## 2021-12-03 MED ORDER — PREGABALIN 75 MG PO CAPS
75.0000 mg | ORAL_CAPSULE | Freq: Every day | ORAL | Status: DC
  Administered 2021-12-06: 75 mg via ORAL
  Filled 2021-12-03 (×2): qty 1

## 2021-12-03 MED ORDER — ONDANSETRON HCL 4 MG/2ML IJ SOLN
4.0000 mg | Freq: Four times a day (QID) | INTRAMUSCULAR | Status: DC | PRN
  Administered 2021-12-03 – 2021-12-04 (×2): 4 mg via INTRAVENOUS
  Filled 2021-12-03 (×2): qty 2

## 2021-12-03 MED ORDER — CHLORHEXIDINE GLUCONATE CLOTH 2 % EX PADS
6.0000 | MEDICATED_PAD | Freq: Every day | CUTANEOUS | Status: DC
  Administered 2021-12-03 – 2021-12-07 (×5): 6 via TOPICAL

## 2021-12-03 MED ORDER — ONDANSETRON HCL 4 MG PO TABS: 4.0000 mg | ORAL_TABLET | Freq: Four times a day (QID) | ORAL | Status: DC | PRN

## 2021-12-03 MED ORDER — OXYCODONE HCL 5 MG PO TABS
5.0000 mg | ORAL_TABLET | ORAL | Status: DC | PRN
  Administered 2021-12-03 – 2021-12-07 (×6): 5 mg via ORAL
  Filled 2021-12-03 (×6): qty 1

## 2021-12-03 MED ORDER — NON FORMULARY: 1.0000 [IU] | Freq: Three times a day (TID) | Status: DC

## 2021-12-03 MED ORDER — ENOXAPARIN SODIUM 40 MG/0.4ML IJ SOSY: 40.0000 mg | PREFILLED_SYRINGE | INTRAMUSCULAR | Status: DC

## 2021-12-03 MED ORDER — MUPIROCIN 2 % EX OINT
1.0000 "application " | TOPICAL_OINTMENT | Freq: Two times a day (BID) | CUTANEOUS | Status: DC
  Administered 2021-12-03 – 2021-12-07 (×9): 1 via NASAL
  Filled 2021-12-03 (×2): qty 22

## 2021-12-03 MED ORDER — IBUPROFEN 200 MG PO TABS: 600.0000 mg | ORAL_TABLET | Freq: Four times a day (QID) | ORAL | Status: DC | PRN

## 2021-12-03 NOTE — Transfer of Care (Signed)
Immediate Anesthesia Transfer of Care Note  Patient: Aldona Lento  Procedure(s) Performed: EXPLORATORY LAPAROTOMY,REMOVAL OF RIGHT TUBE AND OVARY (Abdomen)  Patient Location: PACU  Anesthesia Type:General  Level of Consciousness: awake, alert , oriented and patient cooperative  Airway & Oxygen Therapy: Patient Spontanous Breathing and Patient connected to face mask oxygen  Post-op Assessment: Report given to RN, Post -op Vital signs reviewed and stable and Patient moving all extremities X 4  Post vital signs: stable  Last Vitals:  Vitals Value Taken Time  BP 126/83 12/03/21 0002  Temp    Pulse 110 12/03/21 0013  Resp 13 12/03/21 0013  SpO2 99 % 12/03/21 0013  Vitals shown include unvalidated device data.  Last Pain:  Vitals:   12/02/21 2128  TempSrc:   PainSc: 7       Patients Stated Pain Goal: 4 (92/17/83 7542)  Complications: No notable events documented.

## 2021-12-03 NOTE — Progress Notes (Signed)
SCD machine delivered to unit. Sleeves in place on patient and machine is in operation. Patient educated.

## 2021-12-03 NOTE — Progress Notes (Signed)
RN provided standby assistance as patient ambulated in room; patient tolerated well.

## 2021-12-03 NOTE — Progress Notes (Signed)
RN called [facility] to acquire SCD machine for pt. [facility] had none available. RN then called OR and PACU, but neither of these units had SCD machines either. MD notified.

## 2021-12-03 NOTE — Progress Notes (Signed)
Gyn( primary)  Events( surgery) noted Spoke with pt Sore throat but otherwise feeling much better  GYn onc care much appreciated

## 2021-12-03 NOTE — Progress Notes (Signed)
RN provided set-up and standby assistance to patient as she ambulated in room; tolerated well. Patient now in bedside chair.

## 2021-12-03 NOTE — Progress Notes (Signed)
1 Day Post-Op Procedure(s) (LRB): EXPLORATORY LAPAROTOMY,REMOVAL OF RIGHT TUBE AND OVARY (N/A)  Subjective: Patient reports having a sore throat after surgery but feels overall better. Pain controlled with prn medications. No nausea or emesis reported. She is drinking water and has not had solid food this am. Intermittent belching without flatus. Denies dyspnea and states her shallow breaths are related to the sore throat. Procedure discussed with patient by Dr. Berline Lopes. All questions answered.  Objective: Vital signs in last 24 hours: Temp:  [97.8 F (36.6 C)-99.4 F (37.4 C)] 98.3 F (36.8 C) (12/28 0518) Pulse Rate:  [93-111] 93 (12/28 0518) Resp:  [12-20] 18 (12/28 0518) BP: (95-132)/(67-96) 117/78 (12/28 0518) SpO2:  [92 %-100 %] 95 % (12/28 0518) Last BM Date: 11/30/21  Intake/Output from previous day: 12/27 0701 - 12/28 0700 In: 4304.1 [I.V.:3704.1; IV Piggyback:600] Out: 800 [Urine:300; Blood:500]  Physical Examination: General: alert, cooperative, and no distress Resp: clear to auscultation bilaterally Cardio: regular rate and rhythm, S1, S2 normal, no murmur, click, rub or gallop GI: incision: midline abdominal op site dressing in place with no drainage noted underneath and hypoactive bowel sounds, non tender, slightly distended Extremities: extremities normal, atraumatic, no cyanosis or edema. SCD wraps on both legs but not hooked to the machine-no machine in the room Foley in place with clear, yellow urine  Labs: WBC/Hgb/Hct/Plts:  17.5/9.8/29.1/247 (12/28 0407) BUN/Cr/glu/ALT/AST/amyl/lip:  30/1.92/--/--/--/--/-- (12/28 0407)  Assessment: 50 y.o. s/p Procedure(s): EXPLORATORY LAPAROTOMY,REMOVAL OF RIGHT TUBE AND OVARY: stable. Intraoperative findings including hemoperitoneum (350 cc), right adnexa necrotic, edematous, friable with evidence of torsion x 2.  Pain:  Pain is well-controlled on PRN medications.  Heme: Hgb 9.8 and Hct 29.1 this am. Appropriate compared  with preop values and surgical losses.   ID: Leukocytosis improving: WBC 17.5 this am from 22.6 on 12/02/2021.  CV: BP and HR stable. Continue to monitor with ordered vital signs.  GI:  Tolerating po: Yes, liquids. Antiemetics ordered as needed. Advised to advance diet slowly given hypoactive bowel sounds, no return of bowel function.  GU: Foley in place with adequate urine output documented. Creatinine 1.92 this am- most likely related to dehydration. Plan for repeat Bmet to follow up on creatinine later today.    FEN: No critical values noted. Na+ 133.   Prophylaxis: SCD wraps on the legs but no machine in the room. Lovenox ordered- may change to heparin based on repeat kidney function labs.  Plan: Plan for repeat Bmet to re-evaluate creatinine given elevation this am Plan for removal of foley catheter later today based on repeat Bmet results AM labs ordered Diet as tolerated but advised to take slow given slow bowel function return Increase mobility. Encourage IS Nephrotoxic drugs discontinued or dose decreased Chloraseptic spray at the bedside RN notified to obtain SCD machine Continue plan of care per Dr. Berline Lopes   LOS: 2 days    [name] 12/03/2021, 9:00 AM

## 2021-12-03 NOTE — Progress Notes (Signed)
GYN Oncology Progress Note  Patient states she is doing much better.  She has recently taken a tramadol for pain, which is helping relieve her incisional soreness.  She states she has walked around her room twice.  No dizziness reported.  She feels she moved better the second time.  She has been using her incentive spirometer and currently has her SCD hose on.  She has had chicken broth and Jell-O today with no nausea or emesis reported.  She has been burping frequently and has had no flatus.  We discussed the importance of monitoring urine output once the catheter has been removed.  Informed of repeat Bmet with creatinine on the downward trend. No needs voiced at this time. AM labs ordered.

## 2021-12-03 NOTE — Discharge Instructions (Addendum)
12/07/2021  Return to work: 4-6 weeks  Activity: 1. Be up and out of the bed during the day. Stair climbing will tire you more than you think, you may need to stop part way and rest.   2. No lifting or straining for 6 weeks (no more than 10 pounds).  3. No driving until you can brake safely. For most people, this is 5-10 days. Do Not drive if you are taking narcotic pain medicine.  4. Shower daily.  Use soap and water on your incision and pat dry; don't rub.   5. No sexual activity and nothing in the vagina for 6 weeks.  Medications:  - Take ibuprofen and tylenol first line for pain control. Take these regularly (every 6 hours) to decrease the build up of pain.  - If necessary, for severe pain not relieved by ibuprofen, take tramadol.  - While taking tramadol you should take sennakot every night to reduce the likelihood of constipation. If this causes diarrhea, stop its use.  Diet: 1. Low sodium Heart Healthy Diet is recommended.  2. It is safe to use a laxative if you have difficulty moving your bowels.   Wound Care: 1. Keep clean and dry.  Shower daily.  Reasons to call the Doctor:  Fever - Oral temperature greater than 100.4 degrees Fahrenheit Foul-smelling vaginal discharge Difficulty urinating Nausea and vomiting Increased pain at the site of the incision that is unrelieved with pain medicine. Difficulty breathing with or without chest pain New calf pain especially if only on one side Sudden, continuing increased vaginal bleeding with or without clots.   Follow-up: 1. See Yvonne [name] in 3 weeks, already scheduled on 1/20. 2. You will have an appointment on 1/4 at our clinic to have staples removed. Our clinic is in the cancer center at [facility]  Contacts: For questions or concerns you should contact:  Dr. Jeral [name] at [phone] After hours and on week-ends call [phone] and ask to speak to the physician on call for Gynecologic  Oncology     Information on my medicine - Paxlovid (nirmatrelvir/ritonavir)  This medication education was reviewed with me or my healthcare representative as part of my discharge preparation.    Why was PAXLOVID prescribed for you? It treats mild to moderate COVID-19.  It may help people who are at high risk of developing severe illness.  The medication works by limiting the spread of the virus in your body.  The FDA has allowed emergency use of this medication.  What do You need to know about PAXLOVID ? Take with or without food.  If it upsets your stomach take with food. This drug comes in a blister card that has both the morning and evening dose in it.  Be sure you know how many tablets to take for each dose and which tablets go together.  If you take this drug the wrong way, it may not work as well or you could have more side effects.  Do not take tablets out of the blister card until you are ready to take your dose. Do not take this drug for longer than you were told by your doctor or pharmacist. This drug interacts with many other drugs.  Check with your doctor or pharmacist to make sure that it is safe for you to add new medications including OTC, natural products and vitamins while you are completing your course of Paxlovid. After getting this drug, you must continue to isolate and do  other things to prevent spreading infection to others.  Wear a mask, social distance, do not share personal items, clean and disinfect high touch surfaces, and wash hands often. Tell your doctor if you are pregnant, plan on getting pregnant, or are breast-feeding.  You will need to talk about benefits and risks to you and your baby. Birth control pills and other hormone-based birth control may not work as well to prevent pregnancy.  Use some other kind of birth control also like a condom when taking this drug.  What do you do if you miss a dose? If you miss a dose, take it as soon as you remember unless  it is more than 8 hours late.  If it is more than 8 hours late, skip the missed dose.  Take the next dose at the normal time.  DO NOT TAKE EXTRA or 2 doses at the same time to make up the missed dose.  Important Safety Information Side effects that may happen but usually do not require medical attention - report if they continue:  Change in taste, diarrhea, muscle pain, stomach pain or nausea.  Tell your doctor or get medical help right away if you have any of the following symptoms: Signs of allergic reaction, like rash, hives, itching, red, swollen, blistered, or peeling skin with or without fever, tightness of the chest or throat, trouble breathing. Signs of liver problems like dark urine, tiredness, decreased appetite, light colored stools, throwing up or yellowing of skin or eyes. Sign of high blood pressure like very bad headache or dizziness, passing out, eyesight changes.  This website has more information on Paxlovid (nirmatrelvir & ritonavir) : http://yates.biz/

## 2021-12-03 NOTE — Anesthesia Postprocedure Evaluation (Signed)
Anesthesia Post Note  Patient: Yvonne [name]  Procedure(s) Performed: EXPLORATORY LAPAROTOMY,REMOVAL OF RIGHT TUBE AND OVARY (Abdomen)     Patient location during evaluation: PACU Anesthesia Type: General Level of consciousness: awake and alert Pain management: pain level controlled Vital Signs Assessment: post-procedure vital signs reviewed and stable Respiratory status: spontaneous breathing, nonlabored ventilation, respiratory function stable and patient connected to nasal cannula oxygen Cardiovascular status: blood pressure returned to baseline, stable and tachycardic Postop Assessment: no apparent nausea or vomiting Anesthetic complications: no   No notable events documented.  Last Vitals:  Vitals:   12/03/21 0319 12/03/21 0518  BP: 128/81 117/78  Pulse: (!) 103 93  Resp: 16 18  Temp: 36.9 C 36.8 C  SpO2: 100% 95%    Last Pain:  Vitals:   12/03/21 1001  TempSrc:   PainSc: [place]

## 2021-12-03 NOTE — Progress Notes (Signed)
°  Progress Note   Patient: Yvonne [name] ULA:[id number] DOB: [birth year] DOA: 12/01/2021     2 DOS: the patient was seen and examined on 12/03/2021       Brief hospital course: Mrs. Wilhoite is a 50 y.o. F with HTN, obesity who presented with lower abdominal pain, vomiting, feeling clammy.  In the ER, CTA abdomen and pelvis showed a large cystic mass associated with the right adnexa.  Incidentally COVID+.  12/03/2021: Patient seen and examined at her bedside.  Her daughter and her husband were present in the room.  Reports mild discomfort at the site of her abdominal incisions.  Denies flatulence.    Assessment and Plan Ovarian mass, right- (present on admission) Discussed with patients' Gyn Dr. Garwin Brothers and Dr. Delsa Sale  Gyn-Onc.  They will discuss and one will come evaluate the patient. - Follow CA 19-9, CA125 and CEA  At present, patient unable to tolerate PO - Continue IV fluids - Clears - ADAT   Leukocytosis- (present on admission) And hypotension this morning.  At this time, think this is dehydration, do not suspect sepsis. - IV fluids - Check CXR, blood cultures, urine   ADDENDUM: CXR equivocal, urine unremarkable.  But Procal 0.55.  Will start empiric rocephin, follow cultures, procal - Low threshold to de-escalate Abx  COVID-19 virus infection- (present on admission) -Continue Paxlovid  Depression- (present on admission) -Continue escitalopram  Class 1 obesity without serious comorbidity with body mass index (BMI) of 34.0 to 34.9 in adult BMI 32  Hypertension- (present on admission) -Hold HCTZ  Right ovarian mass with concern for malignancy CA 19-9 elevated. CA125 and CEA normal Gynecology following, awaiting results of pathology  AKI is improving Creatinine is downtrending  Hypovolemic hyponatremia Continue gentle IV fluid hydration  Objective Vital signs reviewed and remarkable for low blood pressure, afebrile. General appearance:  Well-developed well-nourished in no acute distress patient is alert oriented x3.   HEENT: Oropharynx moist, no oral lesions, hearing normal Skin: Normal without suspicious rashes or lesions Cardiac: Regular rate and rhythm no rubs or gallops.   Respiratory: Clear to auscultation no wheezes or rales.   Abdomen: Hypoactive bowel sounds present.  Nondistended. MSK: No lower extremity edema bilaterally. Neuro: Awake and alert.  Nonfocal exam.   Psych: Mood is appropriate for condition and setting.    Data Reviewed: Electrolytes, blood count, CT abdomen and pelvis angiogram, pelvic ultrasound reviewed and summarized above.  Family Communication: Husband by phone  Disposition: Status is: Inpatient  Remains inpatient appropriate because: Patient unable to tolerate oral intake at present.  GYN or GYN oncology will evaluate the patient tomorrow and review her CA125.  From the [place] standpoint, she could be discharged.  From the standpoint of oral intake, she would need to tolerate solid food before she could be discharged.  GYN or GYN oncology will discuss the timing of surgery             Author: Kayleen Memos 12/03/2021 3:24 PM  For on call review [email or web address].

## 2021-12-04 ENCOUNTER — Inpatient Hospital Stay (HOSPITAL_COMMUNITY): Payer: 59

## 2021-12-04 DIAGNOSIS — N83519 Torsion of ovary and ovarian pedicle, unspecified side: Secondary | ICD-10-CM | POA: Diagnosis not present

## 2021-12-04 LAB — COMPREHENSIVE METABOLIC PANEL
ALT: 19 U/L (ref 0–44)
AST: 16 U/L (ref 15–41)
Albumin: 2.7 g/dL — ABNORMAL LOW (ref 3.5–5.0)
Alkaline Phosphatase: 44 U/L (ref 38–126)
Anion gap: 4 — ABNORMAL LOW (ref 5–15)
BUN: 21 mg/dL — ABNORMAL HIGH (ref 6–20)
CO2: 21 mmol/L — ABNORMAL LOW (ref 22–32)
Calcium: 8.2 mg/dL — ABNORMAL LOW (ref 8.9–10.3)
Chloride: 109 mmol/L (ref 98–111)
Creatinine, Ser: 1.47 mg/dL — ABNORMAL HIGH (ref 0.44–1.00)
GFR, Estimated: 43 mL/min — ABNORMAL LOW (ref >60)
Glucose, Bld: 139 mg/dL — ABNORMAL HIGH (ref 70–99)
Potassium: 4.1 mmol/L (ref 3.5–5.1)
Sodium: 134 mmol/L — ABNORMAL LOW (ref 135–145)
Total Bilirubin: 0.6 mg/dL (ref 0.3–1.2)
Total Protein: 5.7 g/dL — ABNORMAL LOW (ref 6.5–8.1)

## 2021-12-04 LAB — CBC WITH DIFFERENTIAL/PLATELET
Abs Immature Granulocytes: 0.12 10*3/uL — ABNORMAL HIGH (ref 0.00–0.07)
Basophils Absolute: 0 10*3/uL (ref 0.0–0.1)
Basophils Relative: 0 %
Eosinophils Absolute: 0 10*3/uL (ref 0.0–0.5)
Eosinophils Relative: 0 %
HCT: 31 % — ABNORMAL LOW (ref 36.0–46.0)
Hemoglobin: 10.5 g/dL — ABNORMAL LOW (ref 12.0–15.0)
Immature Granulocytes: 1 %
Lymphocytes Relative: 9 %
Lymphs Abs: 1.3 10*3/uL (ref 0.7–4.0)
MCH: 31.8 pg (ref 26.0–34.0)
MCHC: 33.9 g/dL (ref 30.0–36.0)
MCV: 93.9 fL (ref 80.0–100.0)
Monocytes Absolute: 1.3 10*3/uL — ABNORMAL HIGH (ref 0.1–1.0)
Monocytes Relative: 9 %
Neutro Abs: 12.4 10*3/uL — ABNORMAL HIGH (ref 1.7–7.7)
Neutrophils Relative %: 81 %
Platelets: 353 10*3/uL (ref 150–400)
RBC: 3.3 MIL/uL — ABNORMAL LOW (ref 3.87–5.11)
RDW: 13.8 % (ref 11.5–15.5)
WBC: 15.1 10*3/uL — ABNORMAL HIGH (ref 4.0–10.5)
nRBC: 0 % (ref 0.0–0.2)

## 2021-12-04 LAB — BASIC METABOLIC PANEL
Anion gap: 7 (ref 5–15)
BUN: 22 mg/dL — ABNORMAL HIGH (ref 6–20)
CO2: 19 mmol/L — ABNORMAL LOW (ref 22–32)
Calcium: 8.3 mg/dL — ABNORMAL LOW (ref 8.9–10.3)
Chloride: 107 mmol/L (ref 98–111)
Creatinine, Ser: 1.48 mg/dL — ABNORMAL HIGH (ref 0.44–1.00)
GFR, Estimated: 43 mL/min — ABNORMAL LOW (ref >60)
Glucose, Bld: 166 mg/dL — ABNORMAL HIGH (ref 70–99)
Potassium: 4.3 mmol/L (ref 3.5–5.1)
Sodium: 133 mmol/L — ABNORMAL LOW (ref 135–145)

## 2021-12-04 LAB — CBC
HCT: 31.8 % — ABNORMAL LOW (ref 36.0–46.0)
Hemoglobin: 10.8 g/dL — ABNORMAL LOW (ref 12.0–15.0)
MCH: 32 pg (ref 26.0–34.0)
MCHC: 34 g/dL (ref 30.0–36.0)
MCV: 94.4 fL (ref 80.0–100.0)
Platelets: 311 10*3/uL (ref 150–400)
RBC: 3.37 MIL/uL — ABNORMAL LOW (ref 3.87–5.11)
RDW: 14 % (ref 11.5–15.5)
WBC: 17.2 10*3/uL — ABNORMAL HIGH (ref 4.0–10.5)
nRBC: 0 % (ref 0.0–0.2)

## 2021-12-04 LAB — MAGNESIUM: Magnesium: 2.1 mg/dL (ref 1.7–2.4)

## 2021-12-04 LAB — LACTIC ACID, PLASMA: Lactic Acid, Venous: 1 mmol/L (ref 0.5–1.9)

## 2021-12-04 LAB — PROCALCITONIN: Procalcitonin: 0.37 ng/mL

## 2021-12-04 LAB — PHOSPHORUS: Phosphorus: 1.9 mg/dL — ABNORMAL LOW (ref 2.5–4.6)

## 2021-12-04 LAB — SURGICAL PATHOLOGY

## 2021-12-04 MED ORDER — DIATRIZOATE MEGLUMINE & SODIUM 66-10 % PO SOLN
90.0000 mL | Freq: Once | ORAL | Status: AC
  Administered 2021-12-04: 20:00:00 90 mL via NASOGASTRIC
  Filled 2021-12-04: qty 90

## 2021-12-04 MED ORDER — BISACODYL 10 MG RE SUPP
10.0000 mg | Freq: Once | RECTAL | Status: AC
  Administered 2021-12-04: 20:00:00 10 mg via RECTAL
  Filled 2021-12-04: qty 1

## 2021-12-04 MED ORDER — PROCHLORPERAZINE EDISYLATE 10 MG/2ML IJ SOLN
5.0000 mg | Freq: Once | INTRAMUSCULAR | Status: AC
  Administered 2021-12-04: 06:00:00 5 mg via INTRAVENOUS
  Filled 2021-12-04: qty 2

## 2021-12-04 MED ORDER — PANTOPRAZOLE SODIUM 40 MG IV SOLR
40.0000 mg | INTRAVENOUS | Status: DC
  Administered 2021-12-04 – 2021-12-05 (×2): 40 mg via INTRAVENOUS
  Filled 2021-12-04 (×2): qty 40

## 2021-12-04 NOTE — Progress Notes (Signed)
pt reports emesis episode-- 1st episode was at 2200 - looks like gastric content, about 500cc, pt was given PRN dose zofran IV.   At 0000 pt has vomited for the 2nd time, gastric content about 200cc.     pt states she ate strawberry jello and some broth today. have not been able to give PM scheduled pills due to emesis episodes. she is still complaining of feeling nauseated and dozing off to sleep intermittently.   On call APP Olena Heckle notified.

## 2021-12-04 NOTE — Progress Notes (Signed)
Pt has missed 2 doses of Plaxlovid.Marland Kitchen due to being NPO and emesis episode

## 2021-12-04 NOTE — Progress Notes (Signed)
Brief progress note GYO  24hr updates: multiple episodes of emesis overnight. I did not receive call, appears medicine coverage was notified.  S: Patient feels better, not currently nauseated after medications. Had 3 episodes of emesis overnight, two larger, last one small. Decreased abdominal pain. Denies flatus/BM. Ambulated yesterday.  O:  Vital signs in last 24 hours: Temp:  [98.4 F (36.9 C)-98.8 F (37.1 C)] 98.4 F (36.9 C) (12/29 0552) Pulse Rate:  [102-108] 102 (12/29 0552) Resp:  [16-20] 20 (12/29 0552) BP: (137-154)/(94-110) 143/106 (12/29 0552) SpO2:  [93 %-97 %] 97 % (12/29 0552) Weight:  [216 lb 6.4 oz (98.2 kg)] 216 lb 6.4 oz (98.2 kg) (12/28 1020) Last BM Date: 11/30/21  Intake/Output from previous day: 12/28 0701 - 12/29 0700 In: 2940.2 [P.O.:480; I.V.:2460.2] Out: 3825 [Urine:3025; Emesis/NG output:800]  GEN: NAD CV: HR in 100s, regular rhythm, no m/r/g Pulm: CTAB, no wheezes Abd: very hypoactive BS, mildly distended and tympanitic, appropriately tender, incision c/d/I with dressing in place Ext: SCDs in place, no edema or calf ttp  Labs: CBC    Component Value Date/Time   WBC 17.2 (H) 12/04/2021 0351   RBC 3.37 (L) 12/04/2021 0351   HGB 10.8 (L) 12/04/2021 0351   HGB 14.3 12/26/2018 1219   HCT 31.8 (L) 12/04/2021 0351   HCT 41.7 12/26/2018 1219   PLT 311 12/04/2021 0351   PLT 340 12/26/2018 1219   MCV 94.4 12/04/2021 0351   MCV 91 12/26/2018 1219   MCH 32.0 12/04/2021 0351   MCHC 34.0 12/04/2021 0351   RDW 14.0 12/04/2021 0351   RDW 13.2 12/26/2018 1219   LYMPHSABS 2.0 12/02/2021 1949   LYMPHSABS 2.7 12/26/2018 1219   MONOABS 2.0 (H) 12/02/2021 1949   EOSABS 0.0 12/02/2021 1949   EOSABS 0.1 12/26/2018 1219   BASOSABS 0.0 12/02/2021 1949   BASOSABS 0.1 12/26/2018 1219   BMP Latest Ref Rng & Units 12/04/2021 12/03/2021 12/03/2021  Glucose 70 - 99 mg/dL 166(H) 121(H) 132(H)  BUN 6 - 20 mg/dL 22(H) 25(H) 30(H)  Creatinine 0.44 - 1.00 mg/dL  1.48(H) 1.61(H) 1.92(H)  BUN/Creat Ratio 9 - 23 - - -  Sodium 135 - 145 mmol/L 133(L) 134(L) 133(L)  Potassium 3.5 - 5.1 mmol/L 4.3 4.0 4.1  Chloride 98 - 111 mmol/L 107 110 107  CO2 22 - 32 mmol/L 19(L) 21(L) 18(L)  Calcium 8.9 - 10.3 mg/dL 8.3(L) 7.9(L) 8.0(L)   A/P: Post-op, meeting most milestones but now with post-op ileus. Had discussed high risk with her yesterday given hemoperitoneum and manipulation of bowel with surgery. CLD ordered, discussed with her sips with meds, otherwise NPO today. Offered NGT placement now vs. If/when has nausea or additional episode of emesis. She prefers to wait. Discussed importance of HOB at least 30 degrees due to risk of aspiration. Encouraged OOB and ambulation. Awaiting final pathology. AKI improving, likely in setting of acute process and hypovolemia prior to surgery.  Jeral Pinch MD Gynecologic Oncology

## 2021-12-04 NOTE — Progress Notes (Signed)
GYN Oncology Progress Note  The patient states she has been doing well since this am except for reflux. States she does not want to sit up too high in the bed since that increases her incisional pain. No nausea or emesis since this am. She feels her abdomen feels less distended. No flatus or bowel movement reported. Voiding without difficulty. She reports feeling fine when out of bed. She has been tolerating sips of clears.   Exam: Alert, oriented, in no acute distress, resting in bed. Hypoactive bowel sounds with abd tympanic on percussion.   No needs voiced except for medication for reflux. Continue plan of care per Dr. Berline Lopes. Advised patient to let her RN know if she becomes nauseated or has emesis for NG tube placement at that time.

## 2021-12-04 NOTE — Consult Note (Signed)
° °  [facility] Inpatient Consult   12/04/2021  Yvonne [name] [birth year] [id number]   [place] Organization [ACO] Patient: Yvonne [name] plan  Primary Care Provider:  Debbrah Alar, NP, Embedded provider at [facility] [place]  Referral source:  [facility] plan  Patient is to be assigned to a [place] Management for telephonic chronic disease management follow up for support and services, if needed. Reviewed for MD progress notes for disposition needs.   Plan: Will continue to follow. Patient will be followed by [place] Coordinator.   For additional questions or referrals please contact:   Natividad Brood, RN BSN [facility] Liaison  [phone] business mobile phone Toll free office [phone]  Fax number: [phone] [email or web address]

## 2021-12-04 NOTE — Progress Notes (Signed)
0800 MD order to assess pt for N&V. For possible NG placement. Pt has been denying any N&V. Pt refuses anything my mouth including her pills.

## 2021-12-04 NOTE — Progress Notes (Signed)
Gastrografin given at 2010. Suction on hold until 2110. Suppository given.  Pt educated on meds and notified of when suction will resume and to call RN if nausea/vomiting

## 2021-12-04 NOTE — Progress Notes (Addendum)
Progress Note   Patient: Yvonne [name] RUE:[id number] DOB: Dec 14, 1970 DOA: 12/01/2021     3 DOS: the patient was seen and examined on 12/04/2021       Brief hospital course: Yvonne [name] is a 50 y.o. F with HTN, obesity who presented with lower abdominal pain, vomiting, feeling clammy.  In the ER, CTA abdomen and pelvis showed a large cystic mass associated with the right adnexa.  Incidentally COVID+.  12/04/2021: Patient was seen and examined at bedside this morning.  Her husband was present in the room.  She reports moderate abdominal pain.  Negative flatus.  Movements.  No nausea this morning.  Had an episode of emesis last night and early this morning.    Assessment and Plan Ovarian mass, right- (present on admission) Discussed with patients' Gyn Dr. Garwin Brothers and Dr. Delsa Sale  Gyn-Onc.  They will discuss and one will come evaluate the patient. - Follow CA 19-9, CA125 and CEA  At present, patient unable to tolerate PO - Continue IV fluids - Clears - ADAT   Leukocytosis- (present on admission) And hypotension this morning.  At this time, think this is dehydration, do not suspect sepsis. - IV fluids - Check CXR, blood cultures, urine   ADDENDUM: CXR equivocal, urine unremarkable.  But Procal 0.55.  Will start empiric rocephin, follow cultures, procal - Low threshold to de-escalate Abx  COVID-19 virus infection- (present on admission) -Continue Paxlovid  Depression- (present on admission) -Continue escitalopram  Class 1 obesity without serious comorbidity with body mass index (BMI) of 34.0 to 34.9 in adult BMI 32  Hypertension- (present on admission) -Hold HCTZ  Right ovarian mass status post right salpingo-oophorectomy on 12/02/21 by Dr. Berline Lopes with concern for malignancy CA 19-9 elevated. CA125 and CEA normal Gynecology following, awaiting results of pathology  Suspect ileus Optimize electrolytes Potassium goal greater than 4.0, magnesium goal greater  than 2.0 Mobilize as tolerated. Continue IV fluid hydration.  Persistent leukocytosis, rule out active infective process Obtain procalcitonin level, lactic acid level Obtain blood cultures x2.  Shortly Monitor fever curve and WBC Repeat CBC this afternoon, low threshold to initiate antibiotics.  AKI is improving Creatinine is downtrending Monitor urine output Continue IV fluid hydration Avoid nephrotoxic agents, dehydration and hypotension.  Non anion gap metabolic acidosis suspect multifactorial Serum bicarb 19 from 21 Continue IV fluid hydration Repeat BMP in the morning  Hypovolemic hyponatremia Continue gentle IV fluid hydration Currently on clear liquid diet due to suspected ileus.  Objective Vital signs reviewed and remarkable for low blood pressure, afebrile. General appearance: Well-developed well-nourished in no acute distress.  She is alert and oriented x3.   HEENT: No oral lesions noted. Skin: No rashes or ulcerative lesions noted. Cardiac: Regular rate and rhythm no rubs or gallops. Respiratory: Clear to auscultation with no wheezes or rales. Abdomen: Hypoactive bowel sounds .  Mild tenderness with palpation at sites of incisions. MSK: No lower extremity edema bilaterally. Neuro: Awake and alert.  Nonfocal exam. Psych: Mood is appropriate for setting.    Data Reviewed: Electrolytes, blood count, CT abdomen and pelvis angiogram, pelvic ultrasound reviewed and summarized above.  Family Communication: Husband by phone  Disposition: Status is: Inpatient  Remains inpatient appropriate because: Patient unable to tolerate oral intake at present.  GYN or GYN oncology will evaluate the patient tomorrow and review her CA125.  From the [place] standpoint, she could be discharged.  From the standpoint of oral intake, she would need to tolerate solid food before she could  be discharged.  GYN or GYN oncology will discuss the timing of surgery              Author: Kayleen Memos 12/04/2021 5:28 PM  For on call review [email or web address].

## 2021-12-05 ENCOUNTER — Telehealth: Payer: Self-pay | Admitting: *Deleted

## 2021-12-05 ENCOUNTER — Inpatient Hospital Stay (HOSPITAL_COMMUNITY): Payer: 59

## 2021-12-05 ENCOUNTER — Telehealth: Payer: Self-pay | Admitting: Gynecologic Oncology

## 2021-12-05 LAB — CBC WITH DIFFERENTIAL/PLATELET
Abs Immature Granulocytes: 0.09 10*3/uL — ABNORMAL HIGH (ref 0.00–0.07)
Basophils Absolute: 0 10*3/uL (ref 0.0–0.1)
Basophils Relative: 0 %
Eosinophils Absolute: 0 10*3/uL (ref 0.0–0.5)
Eosinophils Relative: 0 %
HCT: 29.1 % — ABNORMAL LOW (ref 36.0–46.0)
Hemoglobin: 9.8 g/dL — ABNORMAL LOW (ref 12.0–15.0)
Immature Granulocytes: 1 %
Lymphocytes Relative: 18 %
Lymphs Abs: 2.7 10*3/uL (ref 0.7–4.0)
MCH: 31.8 pg (ref 26.0–34.0)
MCHC: 33.7 g/dL (ref 30.0–36.0)
MCV: 94.5 fL (ref 80.0–100.0)
Monocytes Absolute: 1.5 10*3/uL — ABNORMAL HIGH (ref 0.1–1.0)
Monocytes Relative: 10 %
Neutro Abs: 10.4 10*3/uL — ABNORMAL HIGH (ref 1.7–7.7)
Neutrophils Relative %: 71 %
Platelets: 342 10*3/uL (ref 150–400)
RBC: 3.08 MIL/uL — ABNORMAL LOW (ref 3.87–5.11)
RDW: 14.1 % (ref 11.5–15.5)
WBC: 14.8 10*3/uL — ABNORMAL HIGH (ref 4.0–10.5)
nRBC: 0 % (ref 0.0–0.2)

## 2021-12-05 LAB — COMPREHENSIVE METABOLIC PANEL
ALT: 19 U/L (ref 0–44)
AST: 14 U/L — ABNORMAL LOW (ref 15–41)
Albumin: 2.5 g/dL — ABNORMAL LOW (ref 3.5–5.0)
Alkaline Phosphatase: 46 U/L (ref 38–126)
Anion gap: 10 (ref 5–15)
BUN: 22 mg/dL — ABNORMAL HIGH (ref 6–20)
CO2: 22 mmol/L (ref 22–32)
Calcium: 8.8 mg/dL — ABNORMAL LOW (ref 8.9–10.3)
Chloride: 106 mmol/L (ref 98–111)
Creatinine, Ser: 1.5 mg/dL — ABNORMAL HIGH (ref 0.44–1.00)
GFR, Estimated: 42 mL/min — ABNORMAL LOW (ref >60)
Glucose, Bld: 122 mg/dL — ABNORMAL HIGH (ref 70–99)
Potassium: 4.3 mmol/L (ref 3.5–5.1)
Sodium: 138 mmol/L (ref 135–145)
Total Bilirubin: 0.5 mg/dL (ref 0.3–1.2)
Total Protein: 5.6 g/dL — ABNORMAL LOW (ref 6.5–8.1)

## 2021-12-05 LAB — LACTIC ACID, PLASMA: Lactic Acid, Venous: 0.7 mmol/L (ref 0.5–1.9)

## 2021-12-05 LAB — PROCALCITONIN: Procalcitonin: 0.34 ng/mL

## 2021-12-05 MED ORDER — SODIUM PHOSPHATES 45 MMOLE/15ML IV SOLN
30.0000 mmol | Freq: Once | INTRAVENOUS | Status: AC
  Administered 2021-12-05: 10:00:00 30 mmol via INTRAVENOUS
  Filled 2021-12-05: qty 10

## 2021-12-05 NOTE — Progress Notes (Addendum)
Progress Note   Patient: Yvonne [name] EQA:[id number] DOB: 03-03-1971 DOA: 12/01/2021     4 DOS: the patient was seen and examined on 12/05/2021       Brief hospital course: Yvonne [name] is a 50 y.o. F with HTN, obesity who presented with lower abdominal pain, vomiting, feeling clammy.  In the ER, CTA abdomen and pelvis showed a large cystic mass associated with the right adnexa.  Incidentally COVID+.  12/04/2021: Patient was seen and examined at bedside this morning.  Her husband was present in the room.  She reports moderate abdominal pain.  Negative flatus.  Movements.  No nausea this morning.  Had an episode of emesis last night and early this morning.    Assessment and Plan Ovarian mass, right- (present on admission) Discussed with patients' Gyn Yvonne [name] and Yvonne [name]  Gyn-Onc.  They will discuss and one will come evaluate the patient. - Follow CA 19-9, CA125 and CEA  At present, patient unable to tolerate PO - Continue IV fluids - Clears - ADAT   Leukocytosis- (present on admission) And hypotension this morning.  At this time, think this is dehydration, do not suspect sepsis. - IV fluids - Check CXR, blood cultures, urine   ADDENDUM: CXR equivocal, urine unremarkable.  But Procal 0.55.  Will start empiric rocephin, follow cultures, procal - Low threshold to de-escalate Abx  COVID-19 virus infection- (present on admission) -Continue Paxlovid  Depression- (present on admission) -Continue escitalopram  Class 1 obesity without serious comorbidity with body mass index (BMI) of 34.0 to 34.9 in adult BMI 32  Hypertension- (present on admission) -Hold HCTZ  Right ovarian mass status post right salpingo-oophorectomy on 12/02/21 by Yvonne [name] malignancy ruled out by surgical pathology results CA 19-9 elevated. CA125 and CEA normal  Ileus NG tube placed on 12/04/21 Continue to Optimize electrolytes Potassium goal greater than 4.0, magnesium goal  greater than 2.0 Continue to Mobilize as tolerated. Continue IV fluid hydration.  Persistent leukocytosis, rule out active infective process Procalcitonin level down trending, lactic acid level normal Blood cultures x2 negative to date Continue to Monitor fever curve and WBC Repeat CBC this afternoon, low threshold to initiate antibiotics.  AKI is improving Cr plateaued 1.5 from 1.48 Monitor urine output Continue IV fluid hydration Avoid nephrotoxic agents, dehydration and hypotension.  Resolved Non anion gap metabolic acidosis suspect multifactorial Serum bicarb 22 from 19 from 21 Continue IV fluid hydration Repeat BMP in the morning  Resolved Hypovolemic hyponatremia Continue gentle IV fluid hydration Currently on clear liquid diet due to suspected ileus.  Objective Vital signs reviewed and remarkable for low blood pressure, afebrile. General appearance: Well developed well nourished in no acute distress.  A&O x3  HEENT: No oral lesions Skin: No lesions or rashes Cardiac: Regular rate and rhythm Respiratory: clear to auscultation with no wheezes.  Good inspiratory efforts. Abdomen: Hypoactive bowel sounds, mild tenderness. .  Mild tenderness with palpation at sites of incisions. MSK: No lower extremity edema Neuro: Awake and alert non focal exam Psych: Mood is appropriate    Data Reviewed: Electrolytes, blood count, CT abdomen and pelvis angiogram, pelvic ultrasound reviewed and summarized above.  Family Communication: Husband by phone  Disposition: Status is: Inpatient  Remains inpatient appropriate because: Patient unable to tolerate oral intake at present.  GYN or GYN oncology will evaluate the patient tomorrow and review her CA125.  From the [place] standpoint, she could be discharged.  From the standpoint of oral intake, she would need to  tolerate solid food before she could be discharged.  GYN or GYN oncology will discuss the timing of  surgery    Author: Kayleen Memos 12/05/2021 1:23 PM  For on call review [email or web address].

## 2021-12-05 NOTE — Telephone Encounter (Signed)
Error

## 2021-12-05 NOTE — Telephone Encounter (Signed)
Fax FMLA paperwork to [employer]

## 2021-12-05 NOTE — Progress Notes (Signed)
PT Cancellation Note  Patient Details Name: Yvonne [name] MRN: [id number] DOB: [birth year]   Cancelled Treatment:    Reason Eval/Treat Not Completed: Patient declined stating she does not feel she needs PT. She has been getting up out of the bed and family is assisting her as needed and they are with her at home if she needs help. She did say she is feeling better and moving better than when she arrived.  If status changes, please re-order, but will sign off at this time. Thank you for the referral.   Galen Manila 12/05/2021, 9:50 AM

## 2021-12-05 NOTE — Progress Notes (Signed)
OT Cancellation Note  Patient Details Name: Yvonne [name] MRN: [id number] DOB: [birth year]   Cancelled Treatment:    Reason Eval/Treat Not Completed: OT screened, no needs identified, will sign off. Patient reports ability to get up and perform ADLs. Reports no OT needs.   Breanda Greenlaw L Arcadia Gorgas 12/05/2021, 9:48 AM

## 2021-12-05 NOTE — Progress Notes (Signed)
Pt states she is feeling a better than yesterday.  At 0300 pt had a small bowel movement. KUB completed at 0400  NGT to LIS remains in place.  No complaints of nausea or vomiting throughout this shift.

## 2021-12-05 NOTE — Progress Notes (Signed)
Brief update Patient reports doing well today, has been up and out of bed and walking.  Denies any nausea or emesis.  Denies flatus or further bowel movements.  Pain well controlled. Discussed with her plan to leave NG tube in place.  If she begins having flatus overnight, can plan to remove NG in the morning. All questions answered. Jeral Pinch MD Gynecologic Oncology

## 2021-12-05 NOTE — Progress Notes (Signed)
GYN Oncology Progress Note  24hr updates: Relief in abdominal discomfort/reflux after NG tube placement. Had small BM this am.  S: Patient feels much better after NG tube placement. Had a small BM this am. Denies flatus. No nausea or emesis reported and no more reflux. Ambulating without difficulty. Voiding without difficulty. Able to sit in the chair comfortably given decrease in abdominal distension. No concerns voiced.  O:  Vital signs in last 24 hours: Temp:  [98 F (36.7 C)-98.4 F (36.9 C)] 98 F (36.7 C) (12/30 0557) Pulse Rate:  [86-107] 86 (12/30 0557) Resp:  [15-18] 15 (12/30 0557) BP: (124-153)/(40-97) 139/40 (12/30 0557) SpO2:  [95 %-98 %] 95 % (12/30 0557) Last BM Date: 11/30/21  Intake/Output from previous day: 12/29 0701 - 12/30 0700 In: 2611.5 [I.V.:2501.5; NG/GT:110] Out: 900 [Urine:500; Emesis/NG output:400]  GEN: NAD CV: HR in 100s, regular rhythm, no m/r/g Pulm: CTAB, no wheezes Abd: very hypoactive BS, mildly distended and tympanitic, appropriately tender, incision c/d/I with dressing in place Ext: SCDs in place, no edema or calf ttp  Labs: CBC    Component Value Date/Time   WBC 14.8 (H) 12/05/2021 0357   RBC 3.08 (L) 12/05/2021 0357   HGB 9.8 (L) 12/05/2021 0357   HGB 14.3 12/26/2018 1219   HCT 29.1 (L) 12/05/2021 0357   HCT 41.7 12/26/2018 1219   PLT 342 12/05/2021 0357   PLT 340 12/26/2018 1219   MCV 94.5 12/05/2021 0357   MCV 91 12/26/2018 1219   MCH 31.8 12/05/2021 0357   MCHC 33.7 12/05/2021 0357   RDW 14.1 12/05/2021 0357   RDW 13.2 12/26/2018 1219   LYMPHSABS 2.7 12/05/2021 0357   LYMPHSABS 2.7 12/26/2018 1219   MONOABS 1.5 (H) 12/05/2021 0357   EOSABS 0.0 12/05/2021 0357   EOSABS 0.1 12/26/2018 1219   BASOSABS 0.0 12/05/2021 0357   BASOSABS 0.1 12/26/2018 1219   BMP Latest Ref Rng & Units 12/05/2021 12/04/2021 12/04/2021  Glucose 70 - 99 mg/dL 122(H) 139(H) 166(H)  BUN 6 - 20 mg/dL 22(H) 21(H) 22(H)  Creatinine 0.44 - 1.00 mg/dL  1.50(H) 1.47(H) 1.48(H)  BUN/Creat Ratio 9 - 23 - - -  Sodium 135 - 145 mmol/L 138 134(L) 133(L)  Potassium 3.5 - 5.1 mmol/L 4.3 4.1 4.3  Chloride 98 - 111 mmol/L 106 109 107  CO2 22 - 32 mmol/L 22 21(L) 19(L)  Calcium 8.9 - 10.3 mg/dL 8.8(L) 8.2(L) 8.3(L)   A/P: Post-op, meeting most milestones but now with post-op ileus that seems to be improving. Had discussed high risk with her yesterday given hemoperitoneum and manipulation of bowel with surgery. Continue with NPO status and NG to intermittent suction until passing flatus. Once passing flatus, can initiate a clamping trial of the NG tube. Encouraged OOB and ambulation. AKI post-op, likely in setting of acute process and hypovolemia prior to surgery.  Joylene John NP Gynecologic Oncology

## 2021-12-06 ENCOUNTER — Inpatient Hospital Stay (HOSPITAL_COMMUNITY): Payer: 59

## 2021-12-06 LAB — CBC
HCT: 28.3 % — ABNORMAL LOW (ref 36.0–46.0)
Hemoglobin: 9.4 g/dL — ABNORMAL LOW (ref 12.0–15.0)
MCH: 31.1 pg (ref 26.0–34.0)
MCHC: 33.2 g/dL (ref 30.0–36.0)
MCV: 93.7 fL (ref 80.0–100.0)
Platelets: 321 10*3/uL (ref 150–400)
RBC: 3.02 MIL/uL — ABNORMAL LOW (ref 3.87–5.11)
RDW: 13.8 % (ref 11.5–15.5)
WBC: 11.2 10*3/uL — ABNORMAL HIGH (ref 4.0–10.5)
nRBC: 0 % (ref 0.0–0.2)

## 2021-12-06 LAB — BASIC METABOLIC PANEL
Anion gap: 10 (ref 5–15)
BUN: 18 mg/dL (ref 6–20)
CO2: 21 mmol/L — ABNORMAL LOW (ref 22–32)
Calcium: 8.8 mg/dL — ABNORMAL LOW (ref 8.9–10.3)
Chloride: 107 mmol/L (ref 98–111)
Creatinine, Ser: 1.16 mg/dL — ABNORMAL HIGH (ref 0.44–1.00)
GFR, Estimated: 58 mL/min — ABNORMAL LOW (ref >60)
Glucose, Bld: 80 mg/dL (ref 70–99)
Potassium: 4.1 mmol/L (ref 3.5–5.1)
Sodium: 138 mmol/L (ref 135–145)

## 2021-12-06 LAB — MAGNESIUM: Magnesium: 2.1 mg/dL (ref 1.7–2.4)

## 2021-12-06 LAB — PROCALCITONIN: Procalcitonin: 0.42 ng/mL

## 2021-12-06 MED ORDER — PANTOPRAZOLE SODIUM 20 MG PO TBEC
20.0000 mg | DELAYED_RELEASE_TABLET | Freq: Every day | ORAL | Status: DC
  Administered 2021-12-06 – 2021-12-07 (×2): 20 mg via ORAL
  Filled 2021-12-06 (×2): qty 1

## 2021-12-06 MED ORDER — KCL IN DEXTROSE-NACL 20-5-0.45 MEQ/L-%-% IV SOLN
INTRAVENOUS | Status: DC
  Filled 2021-12-06 (×2): qty 1000

## 2021-12-06 NOTE — Progress Notes (Signed)
GYN Oncology Progress Note  24hr updates: NGT clamped x 3 hours--no nausea/+flatus  S: No pain  O:  Vital signs in last 24 hours: Temp:  [98.2 F (36.8 C)-98.6 F (37 C)] 98.6 F (37 C) (12/30 1950) Pulse Rate:  [81-89] 89 (12/30 1950) Resp:  [18] 18 (12/30 1149) BP: (145-148)/(90-99) 145/90 (12/30 1950) SpO2:  [97 %-98 %] 97 % (12/30 1950) Last BM Date: 11/30/21  Intake/Output from previous day: 12/30 0701 - 12/31 0700 In: 2239.8 [I.V.:2025.2; IV Piggyback:214.6] Out: 2430 [Urine:2250; Emesis/NG output:180]  GEN: NAD CV:  regular rhythm, no m/r/g Pulm: CTAB, no wheezes Abd:  mildly distended and tympanitic, appropriately tender, incision c/d/I with dressing in place Ext: SCDs in place, no edema or calf ttp  Labs: CBC    Component Value Date/Time   WBC 11.2 (H) 12/06/2021 0807   RBC 3.02 (L) 12/06/2021 0807   HGB 9.4 (L) 12/06/2021 0807   HGB 14.3 12/26/2018 1219   HCT 28.3 (L) 12/06/2021 0807   HCT 41.7 12/26/2018 1219   PLT 321 12/06/2021 0807   PLT 340 12/26/2018 1219   MCV 93.7 12/06/2021 0807   MCV 91 12/26/2018 1219   MCH 31.1 12/06/2021 0807   MCHC 33.2 12/06/2021 0807   RDW 13.8 12/06/2021 0807   RDW 13.2 12/26/2018 1219   LYMPHSABS 2.7 12/05/2021 0357   LYMPHSABS 2.7 12/26/2018 1219   MONOABS 1.5 (H) 12/05/2021 0357   EOSABS 0.0 12/05/2021 0357   EOSABS 0.1 12/26/2018 1219   BASOSABS 0.0 12/05/2021 0357   BASOSABS 0.1 12/26/2018 1219   BMP Latest Ref Rng & Units 12/06/2021 12/05/2021 12/04/2021  Glucose 70 - 99 mg/dL 80 122(H) 139(H)  BUN 6 - 20 mg/dL 18 22(H) 21(H)  Creatinine 0.44 - 1.00 mg/dL 1.16(H) 1.50(H) 1.47(H)  BUN/Creat Ratio 9 - 23 - - -  Sodium 135 - 145 mmol/L 138 138 134(L)  Potassium 3.5 - 5.1 mmol/L 4.1 4.3 4.1  Chloride 98 - 111 mmol/L 107 106 109  CO2 22 - 32 mmol/L 21(L) 22 21(L)  Calcium 8.9 - 10.3 mg/dL 8.8(L) 8.8(L) 8.2(L)   A/P: 50 y.o.  Procedure(s): EXPLORATORY LAPAROTOMY,REMOVAL OF RIGHT TUBE AND OVARY   Pain:   Pain is well-controlled.  Heme: Anemia: stable  GI:  Postop ileus resolving  Renal: Downward trending creatinine  FEN:  Mild hypokalemia  Prophylaxis: pharmacologic prophylaxis (with any of the following: enoxaparin (Lovenox) 40mg  SQ 2 hours prior to surgery then every day) and intermittent pneumatic compression boots.  Plan: Encourage ambulation D/C NGT Liquid diet-->advance as tolerate Continue to monitor electrolytes  Lahoma Crocker, MD Gynecologic Oncology

## 2021-12-06 NOTE — Progress Notes (Signed)
Progress Note   Patient: Yvonne [name] QMV:[id number] DOB: [birth year] DOA: 12/01/2021     5 DOS: the patient was seen and examined on 12/06/2021       Brief hospital course: Yvonne [name] is a 50 y.o. F with HTN, obesity who presented with lower abdominal pain, vomiting, feeling clammy.  In the ER, CTA abdomen and pelvis showed a large cystic mass associated with the right adnexa.  Incidentally COVID+.  Hospital course complicated by ileus.  12/06/2021: Patient was seen and examined at bedside.  Endorses bowel movement on 12/30 and on 12/31   Assessment and Plan Ovarian mass, right- (present on admission) Discussed with patients' Gyn Dr. Garwin Brothers and Dr. Delsa Sale  Gyn-Onc.  They will discuss and one will come evaluate the patient. - Follow CA 19-9, CA125 and CEA  At present, patient unable to tolerate PO - Continue IV fluids - Clears - ADAT   Leukocytosis- (present on admission) And hypotension this morning.  At this time, think this is dehydration, do not suspect sepsis. - IV fluids - Check CXR, blood cultures, urine   ADDENDUM: CXR equivocal, urine unremarkable.  But Procal 0.55.  Will start empiric rocephin, follow cultures, procal - Low threshold to de-escalate Abx  COVID-19 virus infection- (present on admission) -Continue Paxlovid  Depression- (present on admission) -Continue escitalopram  Class 1 obesity without serious comorbidity with body mass index (BMI) of 34.0 to 34.9 in adult BMI 32  Hypertension- (present on admission) -Hold HCTZ  Right ovarian mass status post right salpingo-oophorectomy on 12/02/21 by Dr. Berline Lopes malignancy ruled out by surgical pathology results CA 19-9 elevated. CA125 and CEA normal  Ileus, persistent NG tube placed on 12/04/21 Continue to Optimize electrolytes Potassium goal greater than 4.0, magnesium goal greater than 2.0 Continue to Mobilize as tolerated. Continue IV fluid hydration.  Persistent leukocytosis,  rule out active infective process Procalcitonin level down trending, lactic acid level normal Blood cultures x2 negative to date Continue to Monitor fever curve and WBC Repeat CBC this afternoon, low threshold to initiate antibiotics.  Improving prerenal AKI Cr plateaued 1.5 --> 1.1 Monitor urine output Continue IV fluid hydration Continue to avoid nephrotoxic agents, dehydration and hypotension.  Resolved Non anion gap metabolic acidosis suspect multifactorial Serum bicarb 22 from 19 from 21 Continue IV fluid hydration Repeat BMP in the morning  Resolved Hypovolemic hyponatremia Continue gentle IV fluid hydration Currently on clear liquid diet due to suspected ileus.   General appearance: Well-developed well-nourished in no acute distress.  She is alert oriented x3.   HEENT: No oral lesions. Skin: No rashes or ulcerative lesions noted. Cardiac: Regular rate and rhythm no rubs or gallops. Respiratory: Clear to auscultation no wheezes or rales.. Abdomen: Hypoactive bowel sounds noted.   MSK: No lower extremity edema bilaterally. Neuro: Awake and alert nonfocal exam. Psych: Mood is appropriate for condition and setting.    Data Reviewed: Electrolytes, blood count, CT abdomen and pelvis angiogram, pelvic ultrasound reviewed and summarized above.  Family Communication: Husband by phone  Disposition: Status is: Inpatient  Remains inpatient appropriate because: Patient unable to tolerate oral intake at present.  GYN or GYN oncology will evaluate the patient tomorrow and review her CA125.  From the [place] standpoint, she could be discharged.  From the standpoint of oral intake, she would need to tolerate solid food before she could be discharged.  GYN or GYN oncology will discuss the timing of surgery    Author: Kayleen Memos 12/06/2021 4:55 PM  For  on call review [email or web address].

## 2021-12-07 ENCOUNTER — Other Ambulatory Visit (HOSPITAL_COMMUNITY): Payer: Self-pay

## 2021-12-07 DIAGNOSIS — N83511 Torsion of right ovary and ovarian pedicle: Secondary | ICD-10-CM | POA: Diagnosis not present

## 2021-12-07 DIAGNOSIS — M1990 Unspecified osteoarthritis, unspecified site: Secondary | ICD-10-CM | POA: Diagnosis not present

## 2021-12-07 DIAGNOSIS — Z833 Family history of diabetes mellitus: Secondary | ICD-10-CM | POA: Diagnosis not present

## 2021-12-07 DIAGNOSIS — Z8249 Family history of ischemic heart disease and other diseases of the circulatory system: Secondary | ICD-10-CM | POA: Diagnosis not present

## 2021-12-07 DIAGNOSIS — Z6834 Body mass index (BMI) 34.0-34.9, adult: Secondary | ICD-10-CM | POA: Diagnosis not present

## 2021-12-07 DIAGNOSIS — Z79899 Other long term (current) drug therapy: Secondary | ICD-10-CM | POA: Diagnosis not present

## 2021-12-07 DIAGNOSIS — E785 Hyperlipidemia, unspecified: Secondary | ICD-10-CM | POA: Diagnosis not present

## 2021-12-07 DIAGNOSIS — Z807 Family history of other malignant neoplasms of lymphoid, hematopoietic and related tissues: Secondary | ICD-10-CM | POA: Diagnosis not present

## 2021-12-07 DIAGNOSIS — N7092 Oophoritis, unspecified: Secondary | ICD-10-CM | POA: Diagnosis not present

## 2021-12-07 LAB — CBC
HCT: 30.5 % — ABNORMAL LOW (ref 36.0–46.0)
Hemoglobin: 10 g/dL — ABNORMAL LOW (ref 12.0–15.0)
MCH: 31.3 pg (ref 26.0–34.0)
MCHC: 32.8 g/dL (ref 30.0–36.0)
MCV: 95.6 fL (ref 80.0–100.0)
Platelets: 392 10*3/uL (ref 150–400)
RBC: 3.19 MIL/uL — ABNORMAL LOW (ref 3.87–5.11)
RDW: 13.8 % (ref 11.5–15.5)
WBC: 11.6 10*3/uL — ABNORMAL HIGH (ref 4.0–10.5)
nRBC: 0 % (ref 0.0–0.2)

## 2021-12-07 LAB — CULTURE, BLOOD (ROUTINE X 2)
Culture: NO GROWTH
Culture: NO GROWTH
Special Requests: ADEQUATE
Special Requests: ADEQUATE

## 2021-12-07 MED ORDER — TRAMADOL HCL 50 MG PO TABS: 50.0000 mg | ORAL_TABLET | Freq: Four times a day (QID) | ORAL | 0 refills | Status: DC | PRN

## 2021-12-07 MED ORDER — SENNOSIDES-DOCUSATE SODIUM 8.6-50 MG PO TABS
2.0000 | ORAL_TABLET | Freq: Once | ORAL | Status: AC
  Administered 2021-12-07: 2 via ORAL
  Filled 2021-12-07: qty 2

## 2021-12-07 MED ORDER — TRAMADOL HCL 50 MG PO TABS
50.0000 mg | ORAL_TABLET | Freq: Four times a day (QID) | ORAL | 0 refills | Status: DC | PRN
  Filled 2021-12-07: qty 10, 3d supply, fill #0

## 2021-12-07 MED ORDER — SENNOSIDES-DOCUSATE SODIUM 8.6-50 MG PO TABS
2.0000 | ORAL_TABLET | Freq: Every day | ORAL | 1 refills | Status: DC
  Filled 2021-12-07: qty 20, 10d supply, fill #0

## 2021-12-07 MED ORDER — SENNOSIDES-DOCUSATE SODIUM 8.6-50 MG PO TABS: 2.0000 | ORAL_TABLET | Freq: Every day | ORAL | 1 refills | Status: DC

## 2021-12-07 NOTE — Plan of Care (Signed)

## 2021-12-07 NOTE — TOC CM/SW Note (Signed)
°  Transition of Care [facility]) Screening Note   Patient Details  Name: RHYSE SKOWRON Date of Birth: 1971-07-01   Transition of Care [facility]) CM/SW Contact:    Ross Ludwig, LCSW Phone Number: 12/07/2021, 4:18 PM    Transition of Care Department [facility]) has reviewed patient and no TOC needs have been identified at this time. We will continue to monitor patient advancement through interdisciplinary progression rounds. If new patient transition needs arise, please place a TOC consult.

## 2021-12-07 NOTE — Plan of Care (Signed)
°  Problem: Education: Goal: Knowledge of General Education information will improve Description: Including pain rating scale, medication(s)/side effects and non-pharmacologic comfort measures 12/07/2021 1520 by Boston Catarino, Rollen Sox, RN Outcome: Adequate for Discharge 12/07/2021 1518 by Duncan Dull, RN Outcome: Adequate for Discharge   Problem: Health Behavior/Discharge Planning: Goal: Ability to manage health-related needs will improve 12/07/2021 1520 by Sara Keys, Rollen Sox, RN Outcome: Adequate for Discharge 12/07/2021 1518 by Duncan Dull, RN Outcome: Adequate for Discharge   Problem: Clinical Measurements: Goal: Ability to maintain clinical measurements within normal limits will improve 12/07/2021 1520 by Glover Capano, Rollen Sox, RN Outcome: Adequate for Discharge 12/07/2021 1518 by Duncan Dull, RN Outcome: Adequate for Discharge Goal: Will remain free from infection 12/07/2021 1520 by Brihany Butch, Rollen Sox, RN Outcome: Adequate for Discharge 12/07/2021 1518 by Duncan Dull, RN Outcome: Adequate for Discharge Goal: Diagnostic test results will improve 12/07/2021 1520 by Bowe Sidor, Rollen Sox, RN Outcome: Adequate for Discharge 12/07/2021 1518 by Duncan Dull, RN Outcome: Adequate for Discharge Goal: Respiratory complications will improve 12/07/2021 1520 by Marquest Gunkel, Rollen Sox, RN Outcome: Adequate for Discharge 12/07/2021 1518 by Duncan Dull, RN Outcome: Adequate for Discharge Goal: Cardiovascular complication will be avoided 12/07/2021 1520 by Reeves Musick, Rollen Sox, RN Outcome: Adequate for Discharge 12/07/2021 1518 by Kanoelani Dobies, Rollen Sox, RN Outcome: Adequate for Discharge   Problem: Activity: Goal: Risk for activity intolerance will decrease 12/07/2021 1520 by Sharona Rovner, Rollen Sox, RN Outcome: Adequate for Discharge 12/07/2021 1518 by Duncan Dull, RN Outcome: Adequate for Discharge   Problem: Nutrition: Goal: Adequate nutrition will be maintained 12/07/2021 1520 by  Nettie Wyffels, Rollen Sox, RN Outcome: Adequate for Discharge 12/07/2021 1518 by Duncan Dull, RN Outcome: Adequate for Discharge   Problem: Coping: Goal: Level of anxiety will decrease 12/07/2021 1520 by Spiro Ausborn, Rollen Sox, RN Outcome: Adequate for Discharge 12/07/2021 1518 by Duncan Dull, RN Outcome: Adequate for Discharge   Problem: Elimination: Goal: Will not experience complications related to bowel motility 12/07/2021 1520 by Shavar Gorka, Rollen Sox, RN Outcome: Adequate for Discharge 12/07/2021 1518 by Duncan Dull, RN Outcome: Adequate for Discharge Goal: Will not experience complications related to urinary retention 12/07/2021 1520 by Rumaldo Difatta, Rollen Sox, RN Outcome: Adequate for Discharge 12/07/2021 1518 by Duncan Dull, RN Outcome: Adequate for Discharge   Problem: Pain Managment: Goal: General experience of comfort will improve 12/07/2021 1520 by Allisa Einspahr, Rollen Sox, RN Outcome: Adequate for Discharge 12/07/2021 1518 by Duncan Dull, RN Outcome: Adequate for Discharge   Problem: Safety: Goal: Ability to remain free from injury will improve 12/07/2021 1520 by Teodora Baumgarten, Rollen Sox, RN Outcome: Adequate for Discharge 12/07/2021 1518 by Duncan Dull, RN Outcome: Adequate for Discharge   Problem: Skin Integrity: Goal: Risk for impaired skin integrity will decrease 12/07/2021 1520 by Macrina Lehnert, Rollen Sox, RN Outcome: Adequate for Discharge 12/07/2021 1518 by Jaxin Fulfer, Rollen Sox, RN Outcome: Adequate for Discharge

## 2021-12-07 NOTE — Progress Notes (Signed)
Pt tolerating PO foods without issues. Order to discharge pt home.  Discharge instructions/AVS given to patient and reviewed - education provided as needed.  Pt advised to call PCP and/or come back to the hospital if there are any problems. Pt verbalized understanding.

## 2021-12-07 NOTE — Progress Notes (Signed)
Seen at her bedside.  Tolerating a diet.  Denies nausea or significant abdominal pain.  Per primary team plan to dc to home today.  No charge note.

## 2021-12-07 NOTE — Progress Notes (Signed)
GYN Oncology Progress Note  24hr updates: Tolerating po S: -N/V/pain  O:  Vital signs in last 24 hours: Temp:  [97.9 F (36.6 C)-99.1 F (37.3 C)] 97.9 F (36.6 C) (01/01 0524) Pulse Rate:  [85-89] 85 (01/01 0524) Resp:  [19] 19 (01/01 0524) BP: (136-149)/(97) 136/97 (01/01 0524) SpO2:  [98 %-99 %] 99 % (01/01 0524) Last BM Date: 12/06/21  Intake/Output from previous day: 12/31 0701 - 01/01 0700 In: 660 [P.O.:660] Out: 3100 [Urine:3100]  GEN: NAD CV:  regular rhythm, no m/r/g Pulm: CTAB, no wheezes Abd:  mildly distended and tympanitic, appropriately tender, incision c/d/I with dressing in place Ext: SCDs in place, no edema or calf ttp  Labs: CBC    Component Value Date/Time   WBC 11.6 (H) 12/07/2021 0711   RBC 3.19 (L) 12/07/2021 0711   HGB 10.0 (L) 12/07/2021 0711   HGB 14.3 12/26/2018 1219   HCT 30.5 (L) 12/07/2021 0711   HCT 41.7 12/26/2018 1219   PLT 392 12/07/2021 0711   PLT 340 12/26/2018 1219   MCV 95.6 12/07/2021 0711   MCV 91 12/26/2018 1219   MCH 31.3 12/07/2021 0711   MCHC 32.8 12/07/2021 0711   RDW 13.8 12/07/2021 0711   RDW 13.2 12/26/2018 1219   LYMPHSABS 2.7 12/05/2021 0357   LYMPHSABS 2.7 12/26/2018 1219   MONOABS 1.5 (H) 12/05/2021 0357   EOSABS 0.0 12/05/2021 0357   EOSABS 0.1 12/26/2018 1219   BASOSABS 0.0 12/05/2021 0357   BASOSABS 0.1 12/26/2018 1219    A/P: 51 y.o.  Procedure(s): EXPLORATORY LAPAROTOMY,REMOVAL OF RIGHT TUBE AND OVARY   Pain:  Pain is well-controlled.  Heme: Anemia: stable  GI:  Postop ileus resolved, tolerating po  Prophylaxis: pharmacologic prophylaxis (with any of the following: enoxaparin (Lovenox) 40mg  SQ 2 hours prior to surgery then every day) and intermittent pneumatic compression boots.  Plan: OK for D/C home Can send home w/Rx for Ultram Our office will contact her to schedule a postop visit  Lahoma Crocker, MD Gynecologic Oncology

## 2021-12-08 ENCOUNTER — Other Ambulatory Visit (HOSPITAL_COMMUNITY): Payer: Self-pay

## 2021-12-08 NOTE — Progress Notes (Addendum)
Physician Discharge Summary  Patient ID: Yvonne [name] MRN: [id number] DOB/AGE: 12-12-1970 51 y.o.  Admit date: 12/01/2021 Discharge date: 12/08/2021  Admission Diagnoses: Ovarian torsion  Discharge Diagnoses:  Principal Problem:   Ovarian torsion Active Problems:   Hypertension   Class 1 obesity without serious comorbidity with body mass index (BMI) of 34.0 to 34.9 in adult   Depression   COVID-19 virus infection   Leukocytosis   Ovarian mass, right   Pelvic mass in female   Discharged Condition:  The patient is in good condition and stable for discharge.    Hospital Course: The patient presented with acute onset abdominal pain, anorexia. She was found to be COVID +. Imaging revealed large adnexal mass in the abdomen, thought to be arising from right ovary. On initial presentation, patient was afebrile, tachycardic with significant abdominal pain. Leukocytosis (19.9K) noted, lactate was 1.5.  Patient required frequent IV pain medication with continued pain. GYN and GYN oncology both consulted. Due to clinical picture and exam findings, there was concern for ovarian torsion and surgery was recommended. The patient underwent exlap and RSO on 12/26 with findings described in op note but remarkable for hemoperitoneum and necrotic right adnexa was torsion.  The patient's postoperative period was complicated by an ileus that resolved with NGT and conservative management. She had an AKI that developed prior to surgery (likely due to hypovolemia) that was noted to be resolving at the time of discharge. The patient was discharged to home tolerating PO, pain controlled, voiding, reporting bowel function, and ambulating.   Consults: Internal Medicine (initially admitting service, then consulting), GYN  Significant Diagnostic Studies: labs, imaging  Treatments:  Paxlovid for COVID infection Empiric antibiotics for leukocytosis and suspected infection Exlap and RSO for hemorrhagic adnexal  mass with surgical findings confirming ovarian torsion. Final pathology revealed benign tube and ovary. Bowel rest and NGT for postoperative ileus that developed in the setting of hemoperitoneum and surgery  Discharge Exam: Blood pressure (!) 136/97, pulse 85, temperature 97.9 F (36.6 C), temperature source Oral, resp. rate 19, height 5\' 5"  (1.651 m), weight 216 lb 6.4 oz (98.2 kg), last menstrual period 12/05/2021, SpO2 99 %. See Dr. Anson Crofts physical exam from day of discharge.  Disposition: Discharge disposition: 01-Home or Self Care     Plan for follow-up on 1/4 in Dr. Charisse March clinic for staple removal, 1/20 for post-operative visit.   Discharge Instructions     Discharge wound care:   Complete by: As directed    Please take honeycomb dressing off before discharge or when you get home Sunday evening. You can use a pad to cover your incision, but no other bandages are necessary. You will have staples removed on 1/4. You can shower normally, just don't scrub the incision and pat it dry.      Allergies as of 12/07/2021       Reactions   Red Blood Cells    [employer], does not want to receive blood        Medication List     TAKE these medications    escitalopram 20 MG tablet Commonly known as: LEXAPRO TAKE 1 TABLET BY MOUTH DAILY.   hydrochlorothiazide 12.5 MG tablet Commonly known as: HYDRODIURIL Take 1 tablet (12.5 mg total) by mouth daily.   ibuprofen 200 MG tablet Commonly known as: ADVIL Take 400 mg by mouth every 6 (six) hours as needed for moderate pain.   Junel 1/20 1-20 MG-MCG tablet Generic drug: norethindrone-ethinyl estradiol TAKE 1 TABLET  BY MOUTH ONCE DAILY What changed: Another medication with the same name was removed. Continue taking this medication, and follow the directions you see here.   Semaglutide (2 MG/DOSE) 8 MG/3ML Sopn Inject 2 mg as directed once a week.   senna-docusate 8.6-50 MG tablet Commonly known as:  Senokot-S Take 2 tablets by mouth at bedtime.   traMADol 50 MG tablet Commonly known as: ULTRAM Take 1 tablet (50 mg total) by mouth every 6 (six) hours as needed for moderate pain.   Vitamin D (Ergocalciferol) 1.25 MG (50000 UNIT) Caps capsule Commonly known as: DRISDOL Take 1 capsule by mouth every 7  days.               Discharge Care Instructions  (From admission, onward)           Start     Ordered   12/07/21 0000  Discharge wound care:       Comments: Please take honeycomb dressing off before discharge or when you get home Sunday evening. You can use a pad to cover your incision, but no other bandages are necessary. You will have staples removed on 1/4. You can shower normally, just don't scrub the incision and pat it dry.   12/07/21 1516             Greater than thirty minutes were spend for face to face discharge instructions and discharge orders/summary in EPIC.   Signed: Lafonda Mosses 12/08/2021, 12:43 PM

## 2021-12-08 NOTE — Discharge Summary (Signed)
Physician Discharge Summary  Patient ID: Yvonne [name] MRN: [id number] DOB/AGE: 03/30/1971 51 y.o.  Admit date: 12/01/2021 Discharge date: 12/08/2021  Admission Diagnoses: Ovarian torsion  Discharge Diagnoses:  Principal Problem:   Ovarian torsion Active Problems:   Hypertension   Class 1 obesity without serious comorbidity with body mass index (BMI) of 34.0 to 34.9 in adult   Depression   COVID-19 virus infection   Leukocytosis   Ovarian mass, right   Pelvic mass in female   Discharged Condition:  The patient is in good condition and stable for discharge.    Hospital Course: The patient presented with acute onset abdominal pain, anorexia. She was found to be COVID +. Imaging revealed large adnexal mass in the abdomen, thought to be arising from right ovary. On initial presentation, patient was afebrile, tachycardic with significant abdominal pain. Leukocytosis (19.9K) noted, lactate was 1.5.  Patient required frequent IV pain medication with continued pain. GYN and GYN oncology both consulted. Due to clinical picture and exam findings, there was concern for ovarian torsion and surgery was recommended. The patient underwent exlap and RSO on 12/26 with findings described in op note but remarkable for hemoperitoneum and necrotic right adnexa was torsion.  The patient's postoperative period was complicated by an ileus that resolved with NGT and conservative management. She had an AKI that developed prior to surgery (likely due to hypovolemia) that was noted to be resolving at the time of discharge. The patient was discharged to home tolerating PO, pain controlled, voiding, reporting bowel function, and ambulating.   Consults: Internal Medicine (initially admitting service, then consulting), GYN  Significant Diagnostic Studies: labs, imaging  Treatments:  Paxlovid for COVID infection Empiric antibiotics for leukocytosis and suspected infection Exlap and RSO for hemorrhagic adnexal  mass with surgical findings confirming ovarian torsion. Final pathology revealed benign tube and ovary. Bowel rest and NGT for postoperative ileus that developed in the setting of hemoperitoneum and surgery  Discharge Exam: Blood pressure (!) 136/97, pulse 85, temperature 97.9 F (36.6 C), temperature source Oral, resp. rate 19, height 5\' 5"  (1.651 m), weight 216 lb 6.4 oz (98.2 kg), last menstrual period 12/05/2021, SpO2 99 %. See Dr. Anson Crofts physical exam from day of discharge.  Disposition: Discharge disposition: 01-Home or Self Care     Plan for follow-up on 1/4 in Dr. Charisse March clinic for staple removal, 1/20 for post-operative visit.   Discharge Instructions     Discharge wound care:   Complete by: As directed    Please take honeycomb dressing off before discharge or when you get home Sunday evening. You can use a pad to cover your incision, but no other bandages are necessary. You will have staples removed on 1/4. You can shower normally, just don't scrub the incision and pat it dry.      Allergies as of 12/07/2021       Reactions   Red Blood Cells    Jehovah's Witness, does not want to receive blood        Medication List     TAKE these medications    escitalopram 20 MG tablet Commonly known as: LEXAPRO TAKE 1 TABLET BY MOUTH DAILY.   hydrochlorothiazide 12.5 MG tablet Commonly known as: HYDRODIURIL Take 1 tablet (12.5 mg total) by mouth daily.   ibuprofen 200 MG tablet Commonly known as: ADVIL Take 400 mg by mouth every 6 (six) hours as needed for moderate pain.   Junel 1/20 1-20 MG-MCG tablet Generic drug: norethindrone-ethinyl estradiol TAKE 1 TABLET  BY MOUTH ONCE DAILY What changed: Another medication with the same name was removed. Continue taking this medication, and follow the directions you see here.   Semaglutide (2 MG/DOSE) 8 MG/3ML Sopn Inject 2 mg as directed once a week.   senna-docusate 8.6-50 MG tablet Commonly known as:  Senokot-S Take 2 tablets by mouth at bedtime.   traMADol 50 MG tablet Commonly known as: ULTRAM Take 1 tablet (50 mg total) by mouth every 6 (six) hours as needed for moderate pain.   Vitamin D (Ergocalciferol) 1.25 MG (50000 UNIT) Caps capsule Commonly known as: DRISDOL Take 1 capsule by mouth every 7  days.               Discharge Care Instructions  (From admission, onward)           Start     Ordered   12/07/21 0000  Discharge wound care:       Comments: Please take honeycomb dressing off before discharge or when you get home Sunday evening. You can use a pad to cover your incision, but no other bandages are necessary. You will have staples removed on 1/4. You can shower normally, just don't scrub the incision and pat it dry.   12/07/21 1516             Greater than thirty minutes were spend for face to face discharge instructions and discharge orders/summary in EPIC.   Signed: Lafonda Mosses 12/08/2021, 12:43 PM

## 2021-12-09 ENCOUNTER — Telehealth: Payer: Self-pay

## 2021-12-09 ENCOUNTER — Other Ambulatory Visit: Payer: Self-pay

## 2021-12-09 DIAGNOSIS — U071 COVID-19: Secondary | ICD-10-CM

## 2021-12-09 DIAGNOSIS — N838 Other noninflammatory disorders of ovary, fallopian tube and broad ligament: Secondary | ICD-10-CM

## 2021-12-09 DIAGNOSIS — R1900 Intra-abdominal and pelvic swelling, mass and lump, unspecified site: Secondary | ICD-10-CM

## 2021-12-09 NOTE — Telephone Encounter (Signed)
Followed up with Ms. Callow this morning since she's been discharged on 12/07/2021. Pt states she is feeling good and has been up and moving around. She states she is eating well with smaller portions. Pt denies any nausea or vomiting. She states she has not had a BM yet but she is passing a lot of gas. Pt reminded of appt with Joylene John, NP tomorrow (1/4) at 1030.

## 2021-12-10 ENCOUNTER — Other Ambulatory Visit: Payer: Self-pay

## 2021-12-10 ENCOUNTER — Ambulatory Visit (INDEPENDENT_AMBULATORY_CARE_PROVIDER_SITE_OTHER): Payer: 59 | Admitting: Family Medicine

## 2021-12-10 ENCOUNTER — Inpatient Hospital Stay: Payer: 59 | Attending: Gynecologic Oncology | Admitting: Gynecologic Oncology

## 2021-12-10 VITALS — BP 116/77 | HR 108 | Temp 98.3°F | Resp 20 | Ht 65.0 in | Wt 192.4 lb

## 2021-12-10 DIAGNOSIS — D270 Benign neoplasm of right ovary: Secondary | ICD-10-CM

## 2021-12-10 DIAGNOSIS — N83519 Torsion of ovary and ovarian pedicle, unspecified side: Secondary | ICD-10-CM

## 2021-12-10 NOTE — Progress Notes (Signed)
GYN Oncology Post-op  Yvonne [name] is a 51 year old female who presented to the ER on 12/01/2021 with acute onset abdominal pain, anorexia. She was found to be COVID +. Imaging revealed large adnexal mass in the abdomen, thought to be arising from right ovary. On initial presentation, patient was afebrile, tachycardic with significant abdominal pain. Leukocytosis (19.9K) noted, lactate was 1.5.   Patient required frequent IV pain medication with continued pain. GYN and GYN oncology both consulted. Due to clinical picture and exam findings, there was concern for ovarian torsion and surgery was recommended. The patient underwent exlap and RSO on 12/26 with findings described in op note but remarkable for hemoperitoneum and necrotic right adnexa was torsion.   The patient's postoperative period was complicated by an ileus that resolved with NGT and conservative management. She had an AKI that developed prior to surgery (likely due to hypovolemia) that was noted to be resolving at the time of discharge. The patient was discharged to home tolerating PO, pain controlled, voiding, reporting bowel function, and ambulating.   Interval HPI: She presents to the office today for post-operative follow up and staple removal. She states she is doing well at home. She has some fatigue but states this is improving. She had a solid bowel movement yesterday and is passing flatus. Tolerating diet with no nausea or emesis. No lower extrem edema reported. No issues voiced with her incision. When up moving around, her abdomen can feel sore related to lack of support. No fever, chills at home. No concerns voiced.   Exam: Alert, oriented in no acute distress. Abdomen soft, non-tender. 28 staples removed from the midline incision without difficulty. Midline incision is intact with no erythema or drainage. 1/2 inch steri strips applied. Pt tolerated the procedure well. No lower extrem edema appreciated.  Assessment/Plan: 51  year old female s/p exploratory laparotomy with RSO on 12/01/2021 with Dr. Berline Lopes for right ovarian torsion. She is meeting post-op milestones. Incisional care discussed. She is advised to call for any needs, concerns, or new symptoms. She is advised to follow up as planned or sooner if needed. An abdominal binder was given to offer additional support to her incision and abdomen when up moving around.   No charge for this appointment since it is part of the global surgical fee as routine post-op care.

## 2021-12-10 NOTE — Patient Instructions (Signed)
You are healing well! The steri strips that were applied today to your incision will peel off on their on. You are fine to get these wet in the shower and just pat dry when you get out. Please call us for any new symptoms, concerns, or questions at [phone]. We will see you in the office in several weeks or sooner if needed

## 2021-12-11 ENCOUNTER — Telehealth: Payer: Self-pay | Admitting: *Deleted

## 2021-12-11 NOTE — Chronic Care Management (AMB) (Signed)
°  Care Management   Note  12/11/2021 Name: KOYA HUNGER MRN: [id number] DOB: [birth year]  Sydnee Levans Balla is a 51 y.o. year old female who is a primary care patient of Debbrah Alar, NP. I reached out to Aldona Lento by phone today in response to a referral sent by Ms. Sydnee Levans Coca's primary care provider.   Ms. Hannig was given information about care management services today including:  Care management services include personalized support from designated clinical staff supervised by her physician, including individualized plan of care and coordination with other care providers 24/7 contact phone numbers for assistance for urgent and routine care needs. The patient may stop care management services at any time by phone call to the office staff.  Patient agreed to services and verbal consent obtained.   Follow up plan: Telephone appointment with care management team member scheduled for: 12/16/2021  Julian Hy, [facility]  Direct Dial: [phone]

## 2021-12-11 NOTE — Chronic Care Management (AMB) (Signed)
°  Care Management   Outreach Note  12/11/2021 Name: EYVETTE CORDON MRN: [id number] DOB: June 06, 1971  Referred by: Debbrah Alar, NP Reason for referral : Care Coordination (Initial outreach to schedule referral with [facility] )   An unsuccessful telephone outreach was attempted today. The patient was referred to the case management team for assistance with care management and care coordination.   Follow Up Plan:  A HIPAA compliant phone message was left for the patient providing contact information and requesting a return call.  If patient returns call to provider office, please advise to call Embedded Care Management Care Guide Evie Croston at [place], [place] [facility]  Direct Dial: [phone]

## 2021-12-16 ENCOUNTER — Ambulatory Visit: Payer: 59

## 2021-12-16 DIAGNOSIS — R7303 Prediabetes: Secondary | ICD-10-CM

## 2021-12-16 NOTE — Chronic Care Management (AMB) (Signed)
Care Management    RN Visit Note  12/16/2021 Name: Yvonne [name] MRN: [id number] DOB: 09-Nov-1971  Subjective: Yvonne [name] is a 51 y.o. year old female who is a primary care patient of Yvonne Alar, NP. The care management team was consulted for assistance with disease management and care coordination needs.    Engaged with patient by telephone for initial visit in response to provider referral for case management and/or care coordination services.   Consent to Services:   Yvonne [name] was given information about Care Management services today including:  Care Management services includes personalized support from designated clinical staff supervised by her physician, including individualized plan of care and coordination with other care providers 24/7 contact phone numbers for assistance for urgent and routine care needs. The patient may stop case management services at any time by phone call to the office staff.  Patient agreed to services and consent obtained.   Assessment: Review of patient past medical history, allergies, medications, health status, including review of consultants reports, laboratory and other test data, was performed as part of comprehensive evaluation and provision of chronic care management services.   SDOH (Social Determinants of Health) assessments and interventions performed:    Care Plan  Allergies  Allergen Reactions   Red Blood Cells     Jehovah's Witness, does not want to receive blood    Outpatient Encounter Medications as of 12/16/2021  Medication Sig Note   escitalopram (LEXAPRO) 20 MG tablet TAKE 1 TABLET BY MOUTH DAILY.    hydrochlorothiazide (HYDRODIURIL) 12.5 MG tablet Take 1 tablet (12.5 mg total) by mouth daily.    JUNEL 1/20 1-20 MG-MCG tablet TAKE 1 TABLET BY MOUTH ONCE DAILY    Semaglutide, 2 MG/DOSE, 8 MG/3ML SOPN Inject 2 mg as directed once a week.    senna-docusate (SENOKOT-S) 8.6-50 MG tablet Take 2 tablets by mouth at  bedtime.    Vitamin D, Ergocalciferol, (DRISDOL) 1.25 MG (50000 UNIT) CAPS capsule Take 1 capsule by mouth every 7  days. 12/01/2021: Take on Sundays.   ibuprofen (ADVIL) 200 MG tablet Take 400 mg by mouth every 6 (six) hours as needed for moderate pain. (Patient not taking: Reported on 12/16/2021)    traMADol (ULTRAM) 50 MG tablet Take 1 tablet (50 mg total) by mouth every 6 (six) hours as needed for moderate pain. (Patient not taking: Reported on 12/16/2021)    Facility-Administered Encounter Medications as of 12/16/2021  Medication   0.9 %  sodium chloride infusion    Patient Active Problem List   Diagnosis Date Noted   Pelvic mass in female 12/03/2021   Ovarian torsion 12/03/2021   COVID-19 virus infection 12/01/2021   Leukocytosis 12/01/2021   Heavy menses 12/01/2021   Ovarian mass, right 12/01/2021   Depression 05/23/2019   Plantar fasciitis 08/23/2017   Insulin resistance 08/23/2017   Bilateral plantar fasciitis 07/07/2017   Hyperlipidemia 06/01/2017   At risk for diabetes mellitus 06/01/2017   Prediabetes 06/01/2017   Class 1 obesity with serious comorbidity and body mass index (BMI) of 30.0 to 30.9 in adult 06/01/2017   Class 1 obesity without serious comorbidity with body mass index (BMI) of 34.0 to 34.9 in adult 05/17/2017   Other fatigue 05/17/2017   Hypertension 06/30/2015   Family history of multiple myeloma 10/13/2013   Nevus 10/07/2012   Seasonal and perennial allergic rhinitis 04/23/2012   Knee pain 03/06/2012   Snoring 03/06/2012   Preventative health care 09/27/2011   Abnormal liver enzymes 09/27/2011  GRIEF REACTION 09/02/2009   MICROSCOPIC HEMATURIA 09/02/2009   Vitamin D deficiency 02/20/2009    Conditions to be addressed/monitored: HTN and Prediabetes, transition of care post surgery   Care Plan : RN Case Manager Plan of Care  Updates made by Yvonne Rued, RN since 12/16/2021 12:00 AM     Problem: No Plan of Care established for Management of  Care Post hospitalization   Priority: High     Goal: Development of plan of care for Care Coordination and/or care managment needs post hospitalization   Start Date: 12/16/2021  Expected End Date: 01/15/2022  Priority: High  Note:   Current Barriers: Yvonne [name] reports she is doing well. She reports her incision healing: staples removed and steri strips on. She denies redness, swelling, drainage at the site. She reports only discomfort to abdomen and has begun to walk some in the neighborhood. Reports discomfort that is managed with rest. She reports she no longer needs to take Tramadol. She reports blood pressure has been under control and that she continues with a carb modified diet to manage prediabetes. She does not check blood sugar. Last A1C 5.7 on 11/26/21. She denies any questions or concerns at this time.  Knowledge Deficits related to plan of care for management of status post hospitalization and exploratory lap/Removal of right tube/ovary   RNCM Clinical Goal(s):  Patient will verbalize understanding of plan for management of transition of care post hospitalization/post surgery as evidenced by self report, chart notation  through collaboration with RN Care manager, provider, and care team.   Interventions: Collaboration with providers as needed regarding plan of care Inter-disciplinary care team collaboration (see longitudinal plan of care) Evaluation of current treatment plan related to  self management and patient's adherence to plan as established by provider  Surgery Exploratory lap/removal of right tube/ovary  (Status: New goal.) Short Term Goal Evaluation of current treatment plan related to surgery reviewed medications with patient and addressed questions reviewed scheduled provider appointments with patient. Yvonne [name] on 12/26/21 and PCP on 01/16/22, weight management appointments reviewed. confirmed availability of transportation to all appointments .   Discussed incision  healing process and reviewed signs/symptoms of infection Discussed pre-diabetes status and encouraged to continue to eat healthy, per her weight management plan  Patient Goals/Self-Care Activities: Take medications as prescribed   Call provider office for new concerns or questions  Follow up with Dr. Jeral Pinch 12/26/21 at 8:45 and PCP 01/16/22  Call RN Case Manager as needed   Plan: Telephone follow up appointment with care management team member scheduled for:  01/12/22 The patient has been provided with contact information for the care management team and has been advised to call with any health related questions or concerns.   Thea Silversmith, RN, MSN, BSN, CCM Care Management Coordinator [facility] [phone]

## 2021-12-16 NOTE — Patient Instructions (Signed)
Visit Information  Thank you for taking time to visit with me today. Please don't hesitate to contact me if I can be of assistance to you before our next scheduled telephone appointment.  Following are the goals we discussed today:  Patient Goals/Self-Care Activities: Take medications as prescribed   Call provider office for new concerns or questions  Follow up with Dr. Jeral Pinch 12/26/21 at 8:45 and PCP 01/16/22  Call RN Case Manager as needed  Our next appointment is by telephone on 01/12/22 at 10 am  Please call the care guide team at [phone] if you need to cancel or reschedule your appointment.   If you are experiencing a Mental Health or [place] or need someone to talk to, please call the Suicide and Crisis Lifeline: 988 call 1-800-273-TALK (toll free, 24 hour hotline)   Following is a copy of your full plan of care:  Care Plan : RN Case Manager Plan of Care  Updates made by Yvonne Rued, RN since 12/16/2021 12:00 AM     Problem: No Plan of Care established for Management of Care Post hospitalization   Priority: High     Goal: Development of plan of care for Care Coordination and/or care managment needs post hospitalization   Start Date: 12/16/2021  Expected End Date: 01/15/2022  Priority: High  Note:   Current Barriers: Yvonne [name] reports she is doing well. She reports her incision healing: staples removed and steri strips on. She denies redness, swelling, drainage at the site. She reports only discomfort to abdomen and has begun to walk some in the neighborhood. Reports discomfort that is managed with rest. She reports she no longer needs to take Tramadol. She denies any questions or concerns at this time.  Knowledge Deficits related to plan of care for management of status post hospitalization and exploratory lap/Removal of right tube/ovary   RNCM Clinical Goal(s):  Patient will verbalize understanding of plan for management of transition of care  post hospitalization/post surgery as evidenced by self report, chart notation  through collaboration with RN Care manager, provider, and care team.   Interventions: Collaboration with providers as needed regarding plan of care Inter-disciplinary care team collaboration (see longitudinal plan of care) Evaluation of current treatment plan related to  self management and patient's adherence to plan as established by provider  Surgery Exploratory lap/removal of right tube/ovary  (Status: New goal.) Short Term Goal Evaluation of current treatment plan related to surgery reviewed medications with patient and addressed questions reviewed scheduled provider appointments with patient. Dr. Berline Lopes on 12/26/21 and PCP on 01/16/22, weight management appointments reviewed. confirmed availability of transportation to all appointments .   Discussed incision healing process and reviewed signs/symptoms of infection  Patient Goals/Self-Care Activities: Take medications as prescribed   Call provider office for new concerns or questions  Follow up with Dr. Jeral Pinch 12/26/21 at 8:45 and PCP 01/16/22  Call RN Case Manager as needed     Yvonne [name] was given information about Care Management services by the embedded care coordination team including:  Care Management services include personalized support from designated clinical staff supervised by her physician, including individualized plan of care and coordination with other care providers 24/7 contact phone numbers for assistance for urgent and routine care needs. The patient may stop CCM services at any time (effective at the end of the month) by phone call to the office staff.  Patient agreed to services and verbal consent obtained.   Patient verbalizes understanding  of instructions provided today and agrees to view in [place].   Telephone follow up appointment with care management team member scheduled for: The patient has been provided with contact  information for the care management team and has been advised to call with any health related questions or concerns.   Yvonne Silversmith, RN, MSN, BSN, CCM Care Management Coordinator [facility] [phone]

## 2021-12-19 ENCOUNTER — Encounter: Payer: Self-pay | Admitting: Gynecologic Oncology

## 2021-12-26 ENCOUNTER — Encounter: Payer: Self-pay | Admitting: Gynecologic Oncology

## 2021-12-26 ENCOUNTER — Inpatient Hospital Stay (HOSPITAL_BASED_OUTPATIENT_CLINIC_OR_DEPARTMENT_OTHER): Payer: 59 | Admitting: Gynecologic Oncology

## 2021-12-26 ENCOUNTER — Other Ambulatory Visit: Payer: Self-pay

## 2021-12-26 VITALS — BP 121/59 | HR 106 | Temp 97.7°F | Resp 18 | Ht 65.0 in | Wt 201.0 lb

## 2021-12-26 DIAGNOSIS — N83519 Torsion of ovary and ovarian pedicle, unspecified side: Secondary | ICD-10-CM

## 2021-12-26 DIAGNOSIS — K5909 Other constipation: Secondary | ICD-10-CM

## 2021-12-26 DIAGNOSIS — Z90721 Acquired absence of ovaries, unilateral: Secondary | ICD-10-CM

## 2021-12-26 NOTE — Patient Instructions (Signed)
It was great to see you today!  I am glad that you are feeling so much better.  As we discussed, it is normal to still have some fatigue and did feel like you have overdone it.  Recovery takes 6 to 8 weeks.  Please remember, no lifting, pushing, or pulling more than 10 pounds for 6 weeks.  Your incision looks great and has healed very nicely.  In terms of your mild constipation, make sure you are drinking plenty of water and add a fiber supplement.  If this is not enough, you can try either a stool softener, such as Colace, or add a laxative, such as MiraLAX.  Please do not hesitate to contact me if you need anything in the future.

## 2021-12-26 NOTE — Progress Notes (Signed)
Gynecologic Oncology Return Clinic Visit  12/26/21  Reason for Visit: post-operative follow-up  Treatment History: 12/27: Dx lsc with conversion to ex-lap, RSO for ovarian torsion, hemoperitoneum, necrosis of the right ovary. Post-operative course was complicated by an ileus that resolved with conservative management, AKI.  Interval History: Patient presents today for postoperative follow-up.  She notes overall doing very well.  Her abdominal pain has improved significantly.  She still has some hesitation about activity for fear of injuring the inner part of her incision.  She endorses a good appetite without nausea or emesis.  Denies any urinary symptoms.  Is having some issues still with constipation.  Reports regular bowel function while she was taking the Senokot.  Now that she is not, she still has regular bowel function but less and does not always feel like she empties completely.  Past Medical/Surgical History: Past Medical History:  Diagnosis Date   Anemia    Arthritis    High blood pressure    Joint pain    Kidney stones    Microscopic hematuria    Vitamin D deficiency     Past Surgical History:  Procedure Laterality Date   LAPAROTOMY N/A 12/02/2021   Procedure: EXPLORATORY LAPAROTOMY,REMOVAL OF RIGHT TUBE AND OVARY;  Surgeon: Lafonda Mosses, MD;  Location: [facility];  Service: Gynecology;  Laterality: N/A;   NO PAST SURGERIES     Denies surgical history    Family History  Problem Relation Age of Onset   Healthy Father    Congestive Heart Failure Father    Heart disease Father    Diabetes Father    Other Mother        deceased from multiple myelomas   Cancer Mother    Cancer Maternal Grandmother    Multiple myeloma Maternal Grandmother    Cancer Maternal Grandfather        unsure what type of cancer   Prostate cancer Paternal Grandmother    Colon cancer Neg Hx    Esophageal cancer Neg Hx    Liver cancer Neg Hx    Pancreatic cancer Neg Hx    Rectal cancer  Neg Hx    Stomach cancer Neg Hx     Social History   Socioeconomic History   Marital status: Married    Spouse name: Not on file   Number of children: 3   Years of education: Not on file   Highest education level: Not on file  Occupational History   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  Tobacco Use   Smoking status: Never   Smokeless tobacco: Never  Vaping Use   Vaping Use: Never used  Substance and Sexual Activity   Alcohol use: No   Drug use: No   Sexual activity: Yes    Partners: Male    Birth control/protection: Pill  Other Topics Concern   Not on file  Social History Narrative   5 children (3 are out of the house) 3 biiological and 2 step   Married to [employer]   Works in patient Access at [employer]   12/16/21 lives with husband one daughter at home who is 19 yr            Social Determinants of Radio broadcast assistant Strain: Not on file  Food Insecurity: Not on file  Transportation Needs: Not on file  Physical Activity: Not on file  Stress: Not on file  Social Connections: Not on file  Current Medications:  Current Outpatient Medications:    escitalopram (LEXAPRO) 20 MG tablet, TAKE 1 TABLET BY MOUTH DAILY., Disp: 30 tablet, Rfl: 0   hydrochlorothiazide (HYDRODIURIL) 12.5 MG tablet, Take 1 tablet (12.5 mg total) by mouth daily., Disp: 30 tablet, Rfl: 0   JUNEL 1/20 1-20 MG-MCG tablet, TAKE 1 TABLET BY MOUTH ONCE DAILY, Disp: 84 tablet, Rfl: 3   Semaglutide, 2 MG/DOSE, 8 MG/3ML SOPN, Inject 2 mg as directed once a week., Disp: 9 mL, Rfl: 0   senna-docusate (SENOKOT-S) 8.6-50 MG tablet, Take 2 tablets by mouth at bedtime., Disp: 20 tablet, Rfl: 1   Vitamin D, Ergocalciferol, (DRISDOL) 1.25 MG (50000 UNIT) CAPS capsule, Take 1 capsule by mouth every 7  days., Disp: 4 capsule, Rfl: 0   ibuprofen (ADVIL) 200 MG tablet, Take 400 mg by mouth every 6 (six) hours as needed for moderate pain. (Patient not taking: Reported on  12/16/2021), Disp: , Rfl:    traMADol (ULTRAM) 50 MG tablet, Take 1 tablet (50 mg total) by mouth every 6 (six) hours as needed for moderate pain. (Patient not taking: Reported on 12/16/2021), Disp: 10 tablet, Rfl: 0  Current Facility-Administered Medications:    0.9 %  sodium chloride infusion, 500 mL, Intravenous, Once, Pyrtle, Lajuan Lines, MD  Review of Systems: + Abdominal pain, constipation Denies appetite changes, fevers, chills, fatigue, unexplained weight changes. Denies hearing loss, neck lumps or masses, mouth sores, ringing in ears or voice changes. Denies cough or wheezing.  Denies shortness of breath. Denies chest pain or palpitations. Denies leg swelling. Denies abdominal distention, blood in stools, diarrhea, nausea, vomiting, or early satiety. Denies pain with intercourse, dysuria, frequency, hematuria or incontinence. Denies hot flashes, pelvic pain, vaginal bleeding or vaginal discharge.   Denies joint pain, back pain or muscle pain/cramps. Denies itching, rash, or wounds. Denies dizziness, headaches, numbness or seizures. Denies swollen lymph nodes or glands, denies easy bruising or bleeding. Denies anxiety, depression, confusion, or decreased concentration.  Physical Exam: BP (!) 121/59 (BP Location: Left Arm, Patient Position: Sitting)    Pulse (!) 106    Temp 97.7 F (36.5 C) (Tympanic)    Resp 18    Ht $R'5\' 5"'GC$  (1.651 m)    Wt 201 lb (91.2 kg)    LMP 12/05/2021    SpO2 100%    BMI 33.45 kg/m  General: Alert, oriented, no acute distress. HEENT: Normocephalic, atraumatic, sclera anicteric. Chest: Unlabored breathing on room air. Abdomen: Obese, soft, nontender.  Normoactive bowel sounds.  No masses or hepatosplenomegaly appreciated.  Well-healed incision. Extremities: Grossly normal range of motion.  Warm, well perfused.  No edema bilaterally.  Laboratory & Radiologic Studies: A. OVARY AND FALLOPIAN TUBE, RIGHT, SALPINGO-OOPHORECTOMY:  - Ovary with mucinous cystadenoma and  features consistent with torsion.  - Fallopian tube with vascular congestion and necrosis, consistent with  torsion.  - Negative for malignancy.  Assessment & Plan: Yvonne [name] is a 51 y.o. woman who is approximately 3 weeks status post RSO in the setting of ovarian torsion, necrosis, and hemoperitoneum hooves presenting for postoperative follow-up.  Patient is overall doing very well and meeting postoperative milestones.  Discussed continued expectations as well as postoperative restrictions.  In terms of her constipation, I encouraged her to drink plenty of water and add fiber supplement.  We also discussed the use of medications, either stool softener or laxative as needed for bowel function.  Reviewed pathology with her again from surgery.  She was given a copy of her  final pathology report.  Discussed the mechanism for ovarian torsion.  Patient has follow-up with her longstanding OB/GYN next week.  I encouraged her to call me if she needs anything in the future.  26 minutes of total time was spent for this patient encounter, including preparation, face-to-face counseling with the patient and coordination of care, and documentation of the encounter.  Jeral Pinch, MD  Division of Gynecologic Oncology  Department of Obstetrics and Gynecology  [facility]

## 2022-01-01 ENCOUNTER — Other Ambulatory Visit (HOSPITAL_BASED_OUTPATIENT_CLINIC_OR_DEPARTMENT_OTHER): Payer: Self-pay

## 2022-01-01 ENCOUNTER — Other Ambulatory Visit (HOSPITAL_COMMUNITY): Payer: Self-pay

## 2022-01-01 DIAGNOSIS — I10 Essential (primary) hypertension: Secondary | ICD-10-CM | POA: Diagnosis not present

## 2022-01-01 DIAGNOSIS — Z1231 Encounter for screening mammogram for malignant neoplasm of breast: Secondary | ICD-10-CM | POA: Diagnosis not present

## 2022-01-01 DIAGNOSIS — Z01419 Encounter for gynecological examination (general) (routine) without abnormal findings: Secondary | ICD-10-CM | POA: Diagnosis not present

## 2022-01-01 DIAGNOSIS — Z309 Encounter for contraceptive management, unspecified: Secondary | ICD-10-CM | POA: Diagnosis not present

## 2022-01-01 LAB — HM PAP SMEAR: HM Pap smear: NORMAL

## 2022-01-01 LAB — HM MAMMOGRAPHY

## 2022-01-01 MED ORDER — NORETHINDRONE 0.35 MG PO TABS
1.0000 | ORAL_TABLET | Freq: Every day | ORAL | 4 refills | Status: DC
  Filled 2022-01-01 (×2): qty 84, 84d supply, fill #0
  Filled 2022-04-13: qty 84, 84d supply, fill #1

## 2022-01-05 ENCOUNTER — Encounter: Payer: Self-pay | Admitting: *Deleted

## 2022-01-05 ENCOUNTER — Telehealth: Payer: Self-pay | Admitting: *Deleted

## 2022-01-06 DIAGNOSIS — R928 Other abnormal and inconclusive findings on diagnostic imaging of breast: Secondary | ICD-10-CM | POA: Diagnosis not present

## 2022-01-07 NOTE — Telephone Encounter (Signed)
Error

## 2022-01-08 ENCOUNTER — Ambulatory Visit (INDEPENDENT_AMBULATORY_CARE_PROVIDER_SITE_OTHER): Payer: 59 | Admitting: Family Medicine

## 2022-01-12 ENCOUNTER — Encounter: Payer: Self-pay | Admitting: Family

## 2022-01-12 ENCOUNTER — Ambulatory Visit: Payer: 59

## 2022-01-12 ENCOUNTER — Other Ambulatory Visit (HOSPITAL_COMMUNITY): Payer: Self-pay

## 2022-01-12 ENCOUNTER — Ambulatory Visit (INDEPENDENT_AMBULATORY_CARE_PROVIDER_SITE_OTHER): Payer: 59 | Admitting: Family

## 2022-01-12 VITALS — BP 108/78 | HR 101 | Temp 98.3°F | Resp 16 | Ht 66.0 in | Wt 194.0 lb

## 2022-01-12 DIAGNOSIS — Z0000 Encounter for general adult medical examination without abnormal findings: Secondary | ICD-10-CM | POA: Diagnosis not present

## 2022-01-12 DIAGNOSIS — I10 Essential (primary) hypertension: Secondary | ICD-10-CM | POA: Diagnosis not present

## 2022-01-12 DIAGNOSIS — D649 Anemia, unspecified: Secondary | ICD-10-CM | POA: Diagnosis not present

## 2022-01-12 DIAGNOSIS — N289 Disorder of kidney and ureter, unspecified: Secondary | ICD-10-CM | POA: Diagnosis not present

## 2022-01-12 DIAGNOSIS — R7303 Prediabetes: Secondary | ICD-10-CM | POA: Diagnosis not present

## 2022-01-12 DIAGNOSIS — Z807 Family history of other malignant neoplasms of lymphoid, hematopoietic and related tissues: Secondary | ICD-10-CM | POA: Diagnosis not present

## 2022-01-12 LAB — COMPREHENSIVE METABOLIC PANEL
ALT: 17 U/L (ref 0–35)
AST: 12 U/L (ref 0–37)
Albumin: 4.3 g/dL (ref 3.5–5.2)
Alkaline Phosphatase: 82 U/L (ref 39–117)
BUN: 25 mg/dL — ABNORMAL HIGH (ref 6–23)
CO2: 27 mEq/L (ref 19–32)
Calcium: 11.1 mg/dL — ABNORMAL HIGH (ref 8.4–10.5)
Chloride: 106 mEq/L (ref 96–112)
Creatinine, Ser: 0.94 mg/dL (ref 0.40–1.20)
GFR: 71.34 mL/min (ref >60.00)
Glucose, Bld: 99 mg/dL (ref 70–99)
Potassium: 4.2 mEq/L (ref 3.5–5.1)
Sodium: 141 mEq/L (ref 135–145)
Total Bilirubin: 0.3 mg/dL (ref 0.2–1.2)
Total Protein: 7.1 g/dL (ref 6.0–8.3)

## 2022-01-12 LAB — CBC WITH DIFFERENTIAL/PLATELET
Basophils Absolute: 0 10*3/uL (ref 0.0–0.1)
Basophils Relative: 0.5 % (ref 0.0–3.0)
Eosinophils Absolute: 0.1 10*3/uL (ref 0.0–0.7)
Eosinophils Relative: 1.6 % (ref 0.0–5.0)
HCT: 36.6 % (ref 36.0–46.0)
Hemoglobin: 12.1 g/dL (ref 12.0–15.0)
Lymphocytes Relative: 33 % (ref 12.0–46.0)
Lymphs Abs: 2.9 10*3/uL (ref 0.7–4.0)
MCHC: 33.1 g/dL (ref 30.0–36.0)
MCV: 94.6 fl (ref 78.0–100.0)
Monocytes Absolute: 0.7 10*3/uL (ref 0.1–1.0)
Monocytes Relative: 7.7 % (ref 3.0–12.0)
Neutro Abs: 5 10*3/uL (ref 1.4–7.7)
Neutrophils Relative %: 57.2 % (ref 43.0–77.0)
Platelets: 338 10*3/uL (ref 150.0–400.0)
RBC: 3.87 Mil/uL (ref 3.87–5.11)
RDW: 14.6 % (ref 11.5–15.5)
WBC: 8.8 10*3/uL (ref 4.0–10.5)

## 2022-01-12 LAB — MICROALBUMIN / CREATININE URINE RATIO
Creatinine,U: 110.5 mg/dL
Microalb Creat Ratio: 3.5 mg/g (ref 0.0–30.0)
Microalb, Ur: 3.9 mg/dL — ABNORMAL HIGH (ref 0.0–1.9)

## 2022-01-12 LAB — FERRITIN: Ferritin: 131.2 ng/mL (ref 10.0–291.0)

## 2022-01-12 LAB — IRON: Iron: 77 ug/dL (ref 42–145)

## 2022-01-12 NOTE — Assessment & Plan Note (Signed)
BP Readings from Last 3 Encounters:  01/12/22 108/78  12/26/21 (!) 121/59  12/10/21 116/77   BP stable, continue hctz.

## 2022-01-12 NOTE — Chronic Care Management (AMB) (Signed)
Care Management    RN Visit Note  01/12/2022 Name: Yvonne [name] MRN: [id number] DOB: Aug 07, 1971  Subjective: Yvonne [name] is a 51 y.o. year old female who is a primary care patient of Yvonne Alar, NP. The care management team was consulted for assistance with disease management and care coordination needs.    Engaged with patient by telephone for follow up visit in response to provider referral for case management and/or care coordination services.   Consent to Services:   Yvonne [name] was given information about Care Management services today including:  Care Management services includes personalized support from designated clinical staff supervised by her physician, including individualized plan of care and coordination with other care providers 24/7 contact phone numbers for assistance for urgent and routine care needs. The patient may stop case management services at any time by phone call to the office staff.  Patient agreed to services and consent obtained.   Assessment: Review of patient past medical history, allergies, medications, health status, including review of consultants reports, laboratory and other test data, was performed as part of comprehensive evaluation and provision of chronic care management services.   SDOH (Social Determinants of Health) assessments and interventions performed:    Care Plan  Allergies  Allergen Reactions   Red Blood Cells     Jehovah's Witness, does not want to receive blood    Outpatient Encounter Medications as of 01/12/2022  Medication Sig   ergocalciferol (VITAMIN D2) 1.25 MG (50000 UT) capsule Take 50,000 Units by mouth once a week.   hydrochlorothiazide (HYDRODIURIL) 12.5 MG tablet Take 1 tablet (12.5 mg total) by mouth daily.   norethindrone (MICRONOR) 0.35 MG tablet Take 1 tablet by mouth daily.   Facility-Administered Encounter Medications as of 01/12/2022  Medication   0.9 %  sodium chloride infusion    Patient  Active Problem List   Diagnosis Date Noted   Pelvic mass in female 12/03/2021   Ovarian torsion 12/03/2021   COVID-19 virus infection 12/01/2021   Leukocytosis 12/01/2021   Heavy menses 12/01/2021   Ovarian mass, right 12/01/2021   Depression 05/23/2019   Plantar fasciitis 08/23/2017   Insulin resistance 08/23/2017   Bilateral plantar fasciitis 07/07/2017   Hyperlipidemia 06/01/2017   At risk for diabetes mellitus 06/01/2017   Prediabetes 06/01/2017   Class 1 obesity with serious comorbidity and body mass index (BMI) of 30.0 to 30.9 in adult 06/01/2017   Class 1 obesity without serious comorbidity with body mass index (BMI) of 34.0 to 34.9 in adult 05/17/2017   Other fatigue 05/17/2017   Hypertension 06/30/2015   Family history of multiple myeloma 10/13/2013   Nevus 10/07/2012   Seasonal and perennial allergic rhinitis 04/23/2012   Knee pain 03/06/2012   Snoring 03/06/2012   Preventative health care 09/27/2011   Abnormal liver enzymes 09/27/2011   GRIEF REACTION 09/02/2009   MICROSCOPIC HEMATURIA 09/02/2009   Vitamin D deficiency 02/20/2009    Conditions to be addressed/monitored: HTN and healthy lifetsyle changes  Care Plan : RN Case Manager Plan of Care  Updates made by Luretha Rued, RN since 01/12/2022 12:00 AM     Problem: Care Management and/or care coordination healthy lifestyle changes Weight managment   Priority: High     Long-Range Goal: Development of plan of care for Care Coordination and/or care managment weight managment   Start Date: 12/16/2021  Expected End Date: 07/15/2022  Priority: High  Note:   Current Barriers: Yvonne [name] reports she is doing well. She  has had follow up with GYN/ONC and has been released to return to work on tomorrow(01/12/22), but continues to have weight lift restrictions of no more than 10 pounds. She works from home with [employer] [facility] clinic. She denies any pain and remains active walking in the neighborhood.  Annual physical completed today. Continues with healthy lifestyle changes.  Knowledge Deficits related to plan of care for management of status post hospitalization and exploratory lap/Removal of right tube/ovary  Goal completed Healthy lifestyle changes/Weight management  RNCM Clinical Goal(s):  Patient will verbalize understanding of plan for management of weight managment as evidenced by self report, chart notation  through collaboration with RN Care manager, provider, and care team. Goal complete   Interventions: Collaboration with providers as needed regarding plan of care Inter-disciplinary care team collaboration (see longitudinal plan of care) Evaluation of current treatment plan related to  self management and patient's adherence to plan as established by provider  Surgery Exploratory lap/removal of right tube/ovary  (Status: Goal Met.) Short Term Goal Evaluation of current treatment plan related to surgery reviewed medications with patient and addressed questions reviewed scheduled provider appointments with patient. Dr. Berline Lopes on 12/26/21 and PCP on 01/16/22, weight management appointments reviewed. confirmed availability of transportation to all appointments .   Discussed incision healing process and reviewed signs/symptoms of infection Discussed pre-diabetes status and encouraged to continue to eat healthy, per her weight management plan  Weight Loss Interventions:  (Status:  New goal.) Long Term Goal Provided verbal and/or written education to patient re: provider recommended life style modifications  Encouraged to continue to work with [facility] Discussed meal planning and encouraged to continue to eat Healthy   Discussed activity level and encouraged to maintain activity based on provider recommendations  Patient Goals/Self-Care Activities: Take medications as prescribed   Call provider office for new concerns or questions  Eat Healthy: fruits,  vegetable, lean meats, healthy fats. Avoid processed food, foots high in saturated fats and transf ats, monitor salt intake  Remain active and continue walking per provider recommendations Follow up with [facility] provider regarding weight management recommendations   Plan: Telephone follow up appointment with care management team member scheduled for:  04/10/22 The patient has been provided with contact information for the care management team and has been advised to call with any health related questions or concerns.   Thea Silversmith, RN, MSN, BSN, CCM Care Management Coordinator [facility] [phone]

## 2022-01-12 NOTE — Progress Notes (Signed)
Subjective:     Patient ID: Yvonne [name], female    DOB: 1971-04-26, 51 y.o.   MRN: [id number]  Chief Complaint  Patient presents with   Annual Exam    HPI  Patient presents today for complete physical.  Immunizations: tdap 2015, flu shot up to date. She has had pfizer Diet: was doing well prior to her surgery. Exercise: plans to restart following surgery Wt Readings from Last 3 Encounters:  01/12/22 194 lb (88 kg)  12/26/21 201 lb (91.2 kg)  12/10/21 192 lb 6 oz (87.3 kg)    Colonoscopy: 2019- due 2024 Pap Smear: 1/23 Mammogram: up to date  Since her last visit she was admitted for an ovarian mass/ovarian torsion and underwent open removal. Path was benign.  Health Maintenance Due  Topic Date Due   URINE MICROALBUMIN  Never done   Hepatitis C Screening  Never done    Past Medical History:  Diagnosis Date   Anemia    Arthritis    High blood pressure    Joint pain    Kidney stones    Microscopic hematuria    Ovarian torsion 2022   Vitamin D deficiency     Past Surgical History:  Procedure Laterality Date   LAPAROTOMY N/A 12/02/2021   Procedure: EXPLORATORY LAPAROTOMY,REMOVAL OF RIGHT TUBE AND OVARY;  Surgeon: Lafonda Mosses, MD;  Location: [facility];  Service: Gynecology;  Laterality: N/A;   NO PAST SURGERIES     Denies surgical history    Family History  Problem Relation Age of Onset   Other Mother        deceased from multiple myelomas   Cancer Mother    Healthy Father    Congestive Heart Failure Father    Heart disease Father        CABG   Diabetes Father    Cancer Maternal Grandmother    Multiple myeloma Maternal Grandmother    Cancer Maternal Grandfather        unsure what type of cancer   Prostate cancer Paternal Grandmother    Colon cancer Neg Hx    Esophageal cancer Neg Hx    Liver cancer Neg Hx    Pancreatic cancer Neg Hx    Rectal cancer Neg Hx    Stomach cancer Neg Hx     Social History   Socioeconomic History   Marital  status: Married    Spouse name: Not on file   Number of children: 3   Years of education: Not on file   Highest education level: Not on file  Occupational History   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    Employer: [employer]  Tobacco Use   Smoking status: Never   Smokeless tobacco: Never  Vaping Use   Vaping Use: Never used  Substance and Sexual Activity   Alcohol use: No   Drug use: No   Sexual activity: Yes    Partners: Male    Birth control/protection: Pill  Other Topics Concern   Not on file  Social History Narrative   5 children (3 are out of the house) 3 biiological and 2 step   Married to [employer]   Works in patient Access at [employer]   12/16/21 lives with husband one daughter at home who is 64 yr            Social Determinants of Radio broadcast assistant Strain: Not on file  Food Insecurity: Not on file  Transportation Needs: Not  on file  Physical Activity: Not on file  Stress: Not on file  Social Connections: Not on file  Intimate Partner Violence: Not on file    Outpatient Medications Prior to Visit  Medication Sig Dispense Refill   ergocalciferol (VITAMIN D2) 1.25 MG (50000 UT) capsule Take 50,000 Units by mouth once a week.     norethindrone (MICRONOR) 0.35 MG tablet Take 1 tablet by mouth daily. 84 tablet 4   hydrochlorothiazide (HYDRODIURIL) 12.5 MG tablet Take 1 tablet (12.5 mg total) by mouth daily. 30 tablet 0   escitalopram (LEXAPRO) 20 MG tablet TAKE 1 TABLET BY MOUTH DAILY. 30 tablet 0   ibuprofen (ADVIL) 200 MG tablet Take 400 mg by mouth every 6 (six) hours as needed for moderate pain. (Patient not taking: Reported on 12/16/2021)     JUNEL 1/20 1-20 MG-MCG tablet TAKE 1 TABLET BY MOUTH ONCE DAILY 84 tablet 3   Semaglutide, 2 MG/DOSE, 8 MG/3ML SOPN Inject 2 mg as directed once a week. 9 mL 0   senna-docusate (SENOKOT-S) 8.6-50 MG tablet Take 2 tablets by mouth at bedtime. 20 tablet 1   traMADol (ULTRAM) 50 MG tablet Take 1 tablet (50 mg  total) by mouth every 6 (six) hours as needed for moderate pain. (Patient not taking: Reported on 12/16/2021) 10 tablet 0   Facility-Administered Medications Prior to Visit  Medication Dose Route Frequency Provider Last Rate Last Admin   0.9 %  sodium chloride infusion  500 mL Intravenous Once Pyrtle, Lajuan Lines, MD        Allergies  Allergen Reactions   Red Blood Cells     Jehovah's Witness, does not want to receive blood    Review of Systems  Constitutional:  Negative for fever.  HENT:  Negative for congestion and hearing loss.   Eyes:  Negative for blurred vision.  Respiratory:  Negative for cough and shortness of breath.   Cardiovascular:  Negative for chest pain.  Gastrointestinal:  Negative for constipation and diarrhea.  Genitourinary:  Negative for dysuria and frequency.  Neurological:  Negative for headaches.  Psychiatric/Behavioral:         Denies depression/anxity      Objective:    Physical Exam  BP 108/78    Pulse (!) 101    Temp 98.3 F (36.8 C) (Oral)    Resp 16    Ht _0  (1.676 m)    Wt 194 lb (88 kg)    SpO2 99%    BMI 31.31 kg/m  Wt Readings from Last 3 Encounters:  01/12/22 194 lb (88 kg)  12/26/21 201 lb (91.2 kg)  12/10/21 192 lb 6 oz (87.3 kg)   Physical Exam  Constitutional: She is oriented to person, place, and time. She appears well-developed and well-nourished. No distress.  HENT:  Head: Normocephalic and atraumatic.  Right Ear: Tympanic membrane and ear canal normal.  Left Ear: Tympanic membrane and ear canal normal.  Mouth/Throat: Not examined- pt wearing mask Eyes: Pupils are equal, round, and reactive to light. No scleral icterus.  Neck: Normal range of motion. No thyromegaly present.  Cardiovascular: Normal rate and regular rhythm.   No murmur heard. Pulmonary/Chest: Effort normal and breath sounds normal. No respiratory distress. He has no wheezes. She has no rales. She exhibits no tenderness.  Abdominal: Soft. Bowel sounds are normal. She  exhibits no distension and no mass. There is no tenderness. There is no rebound and no guarding.  Musculoskeletal: She exhibits no edema.  Lymphadenopathy:  She has no cervical adenopathy.  Neurological: She is alert and oriented to person, place, and time. She has normal patellar reflexes. She exhibits normal muscle tone. Coordination normal.  Skin: Skin is warm and dry.  Psychiatric: She has a normal mood and affect. Her behavior is normal. Judgment and thought content normal.  Breast/Pelvic: deferred          Assessment & Plan:       Assessment & Plan:   Problem List Items Addressed This Visit       Unprioritized   Preventative health care - Primary    Discussed healthy diet, exercise, weight loss. Pap/mammo/colo up to date. Recommended that she obtain covid bivalent booster.       Prediabetes   Relevant Orders   Urine Microalbumin w/creat. ratio   Hypertension    BP Readings from Last 3 Encounters:  01/12/22 108/78  12/26/21 (!) 121/59  12/10/21 116/77  BP stable, continue hctz.       Family history of multiple myeloma   Relevant Orders   Protein Electrophoresis, (serum)   Other Visit Diagnoses     Renal insufficiency       Relevant Orders   Comp Met (CMET)   Anemia, unspecified type       Relevant Orders   CBC with Differential/Platelet   Iron   Ferritin       I have discontinued Tiffane D. Duford's Junel 1/20, escitalopram, Semaglutide (2 MG/DOSE), ibuprofen, traMADol, and senna-docusate. I am also having her maintain her hydrochlorothiazide, norethindrone, and ergocalciferol. We will continue to administer sodium chloride.  No orders of the defined types were placed in this encounter.

## 2022-01-12 NOTE — Patient Instructions (Signed)
Please complete lab work prior to leaving.   

## 2022-01-12 NOTE — Assessment & Plan Note (Signed)
Discussed healthy diet, exercise, weight loss. Pap/mammo/colo up to date. Recommended that she obtain covid bivalent booster.

## 2022-01-12 NOTE — Patient Instructions (Addendum)
Visit Information  Thank you for taking time to visit with me today. Please don't hesitate to contact me if I can be of assistance to you before our next scheduled telephone appointment.  Following are the goals we discussed today:  Patient Goals/Self-Care Activities: Take medications as prescribed   Call provider office for new concerns or questions  Eat Healthy: fruits, vegetable, lean meats, healthy fats. Avoid processed food, foots high in saturated fats and transf ats, monitor salt intake  Remain active and continue walking per provider recommendations Follow up with [facility] provider regarding weight management recommendations  Our next appointment is by telephone on 04/10/22 at 10:am  Please call the care guide team at [phone] if you need to cancel or reschedule your appointment.   If you are experiencing a Mental Health or [place] or need someone to talk to, please call the Suicide and Crisis Lifeline: 988 call 1-800-273-TALK (toll free, 24 hour hotline)   Patient verbalizes understanding of instructions and care plan provided today and agrees to view in [place]. Active MyChart status confirmed with patient.    The patient has been provided with contact information for the care management team and has been advised to call with any health related questions or concerns.   Thea Silversmith, RN, MSN, BSN, CCM Care Management Coordinator [facility] [phone]   Healthy Eating Following a healthy eating pattern may help you to achieve and maintain a healthy body weight, reduce the risk of chronic disease, and live a long and productive life. It is important to follow a healthy eating pattern at an appropriate calorie level for your body. Your nutritional needs should be met primarily through food by choosing a variety of nutrient-rich foods. What are tips for following this plan? Reading food labels Read labels and choose the  following: Reduced or low sodium. Juices with 100% fruit juice. Foods with low saturated fats and high polyunsaturated and monounsaturated fats. Foods with whole grains, such as whole wheat, cracked wheat, brown rice, and wild rice. Whole grains that are fortified with folic acid. This is recommended for women who are pregnant or who want to become pregnant. Read labels and avoid the following: Foods with a lot of added sugars. These include foods that contain brown sugar, corn sweetener, corn syrup, dextrose, fructose, glucose, high-fructose corn syrup, honey, invert sugar, lactose, malt syrup, maltose, molasses, raw sugar, sucrose, trehalose, or turbinado sugar. Do not eat more than the following amounts of added sugar per day: 6 teaspoons (25 g) for women. 9 teaspoons (38 g) for men. Foods that contain processed or refined starches and grains. Refined grain products, such as white flour, degermed cornmeal, white bread, and white rice. Shopping Choose nutrient-rich snacks, such as vegetables, whole fruits, and nuts. Avoid high-calorie and high-sugar snacks, such as potato chips, fruit snacks, and candy. Use oil-based dressings and spreads on foods instead of solid fats such as butter, stick margarine, or cream cheese. Limit pre-made sauces, mixes, and "instant" products such as flavored rice, instant noodles, and ready-made pasta. Try more plant-protein sources, such as tofu, tempeh, black beans, edamame, lentils, nuts, and seeds. Explore eating plans such as the Mediterranean diet or vegetarian diet. Cooking Use oil to saut or stir-fry foods instead of solid fats such as butter, stick margarine, or lard. Try baking, boiling, grilling, or broiling instead of frying. Remove the fatty part of meats before cooking. Steam vegetables in water or broth. Meal planning  At  meals, imagine dividing your plate into fourths: One-half of your plate is fruits and vegetables. One-fourth of your plate  is whole grains. One-fourth of your plate is protein, especially lean meats, poultry, eggs, tofu, beans, or nuts. Include low-fat dairy as part of your daily diet. Lifestyle Choose healthy options in all settings, including home, work, school, restaurants, or stores. Prepare your food safely: Wash your hands after handling raw meats. Keep food preparation surfaces clean by regularly washing with hot, soapy water. Keep raw meats separate from ready-to-eat foods, such as fruits and vegetables. Cook seafood, meat, poultry, and eggs to the recommended internal temperature. Store foods at safe temperatures. In general: Keep cold foods at 45F (4.4C) or below. Keep hot foods at 145F (60C) or above. Keep your freezer at [facility] (-17.8C) or below. Foods are no longer safe to eat when they have been between the temperatures of 40-145F (4.4-60C) for more than 2 hours. What foods should I eat? Fruits Aim to eat 2 cup-equivalents of fresh, canned (in natural juice), or frozen fruits each day. Examples of 1 cup-equivalent of fruit include 1 small apple, 8 large strawberries, 1 cup canned fruit,  cup dried fruit, or 1 cup 100% juice. Vegetables Aim to eat 2-3 cup-equivalents of fresh and frozen vegetables each day, including different varieties and colors. Examples of 1 cup-equivalent of vegetables include 2 medium carrots, 2 cups raw, leafy greens, 1 cup chopped vegetable (raw or cooked), or 1 medium baked potato. Grains Aim to eat 6 ounce-equivalents of whole grains each day. Examples of 1 ounce-equivalent of grains include 1 slice of bread, 1 cup ready-to-eat cereal, 3 cups popcorn, or  cup cooked rice, pasta, or cereal. Meats and other proteins Aim to eat 5-6 ounce-equivalents of protein each day. Examples of 1 ounce-equivalent of protein include 1 egg, 1/2 cup nuts or seeds, or 1 tablespoon (16 g) peanut butter. A cut of meat or fish that is the size of a deck of cards is about 3-4  ounce-equivalents. Of the protein you eat each week, try to have at least 8 ounces come from seafood. This includes salmon, trout, herring, and anchovies. Dairy Aim to eat 3 cup-equivalents of fat-free or low-fat dairy each day. Examples of 1 cup-equivalent of dairy include 1 cup (240 mL) milk, 8 ounces (250 g) yogurt, 1 ounces (44 g) natural cheese, or 1 cup (240 mL) fortified soy milk. Fats and oils Aim for about 5 teaspoons (21 g) per day. Choose monounsaturated fats, such as canola and olive oils, avocados, peanut butter, and most nuts, or polyunsaturated fats, such as sunflower, corn, and soybean oils, walnuts, pine nuts, sesame seeds, sunflower seeds, and flaxseed. Beverages Aim for six 8-oz glasses of water per day. Limit coffee to three to five 8-oz cups per day. Limit caffeinated beverages that have added calories, such as soda and energy drinks. Limit alcohol intake to no more than 1 drink a day for nonpregnant women and 2 drinks a day for men. One drink equals 12 oz of beer (355 mL), 5 oz of wine (148 mL), or 1 oz of hard liquor (44 mL). Seasoning and other foods Avoid adding excess amounts of salt to your foods. Try flavoring foods with herbs and spices instead of salt. Avoid adding sugar to foods. Try using oil-based dressings, sauces, and spreads instead of solid fats. This information is based on general [place] nutrition guidelines. For more information, visit [employer]. Exact amounts may vary based on your nutrition needs. Summary A healthy  eating plan may help you to maintain a healthy weight, reduce the risk of chronic diseases, and stay active throughout your life. Plan your meals. Make sure you eat the right portions of a variety of nutrient-rich foods. Try baking, boiling, grilling, or broiling instead of frying. Choose healthy options in all settings, including home, work, school, restaurants, or stores. This information is not intended to replace advice given to you  by your health care provider. Make sure you discuss any questions you have with your health care provider. Document Revised: 07/22/2021 Document Reviewed: 07/22/2021 Elsevier Patient Education  [employer] [place].

## 2022-01-15 ENCOUNTER — Telehealth: Payer: Self-pay | Admitting: Family

## 2022-01-15 LAB — PROTEIN ELECTROPHORESIS, SERUM
Albumin ELP: 4 g/dL (ref 3.8–4.8)
Alpha 1: 0.3 g/dL (ref 0.2–0.3)
Alpha 2: 0.7 g/dL (ref 0.5–0.9)
Beta 2: 0.5 g/dL (ref 0.2–0.5)
Beta Globulin: 0.5 g/dL (ref 0.4–0.6)
Gamma Globulin: 1.1 g/dL (ref 0.8–1.7)
Total Protein: 7 g/dL (ref 6.1–8.1)

## 2022-01-15 NOTE — Telephone Encounter (Signed)
BP Readings from Last 3 Encounters:  01/12/22 108/78  12/26/21 (!) 121/59  12/10/21 116/77   She is noted to have some protein in her urine.  I would recommend that she stop hctz and instead start lisinopril $RemoveBeforeDE'5mg'[id number]$  once daily for kidney protection.  Follow up in 2 weeks for nurse visit BP check and follow up bmet dx htn.   Her multiple myeloma panel is normal.   Calcium level is elevated. I would like to repeat calcium and some parathyroid tests when she has her lab work in 2 weeks. If she is taking a calcium supplement, please discontinue.

## 2022-01-16 ENCOUNTER — Encounter: Payer: 59 | Admitting: Family

## 2022-01-16 ENCOUNTER — Other Ambulatory Visit: Payer: Self-pay

## 2022-01-16 ENCOUNTER — Other Ambulatory Visit (HOSPITAL_COMMUNITY): Payer: Self-pay

## 2022-01-16 DIAGNOSIS — I10 Essential (primary) hypertension: Secondary | ICD-10-CM

## 2022-01-16 MED ORDER — LISINOPRIL 5 MG PO TABS
5.0000 mg | ORAL_TABLET | Freq: Every day | ORAL | 3 refills | Status: DC
  Filled 2022-01-16: qty 90, 90d supply, fill #0

## 2022-01-16 NOTE — Telephone Encounter (Signed)
Patient called back to go over lab results, she would like a call back to [phone].

## 2022-01-16 NOTE — Telephone Encounter (Signed)
Pt aware and voices understanding. Appointments scheduled.

## 2022-01-16 NOTE — Telephone Encounter (Signed)
Pt called , a man answered the phone stated she wasn't home currently , asked female to have pt call us back

## 2022-01-16 NOTE — Telephone Encounter (Signed)
Pt asks about the infusion being removed from her med list- she says [facility] tried to remove it but it required a CoSigner. Is this something you can do since this was a one time thing?

## 2022-01-18 NOTE — Telephone Encounter (Signed)
Removed

## 2022-01-21 ENCOUNTER — Other Ambulatory Visit (HOSPITAL_COMMUNITY): Payer: Self-pay

## 2022-01-21 ENCOUNTER — Encounter (INDEPENDENT_AMBULATORY_CARE_PROVIDER_SITE_OTHER): Payer: Self-pay | Admitting: Family Medicine

## 2022-01-21 ENCOUNTER — Ambulatory Visit (INDEPENDENT_AMBULATORY_CARE_PROVIDER_SITE_OTHER): Payer: 59 | Admitting: Family Medicine

## 2022-01-21 ENCOUNTER — Other Ambulatory Visit: Payer: Self-pay

## 2022-01-21 VITALS — BP 128/89 | HR 107 | Temp 98.0°F | Ht 65.0 in | Wt 196.0 lb

## 2022-01-21 DIAGNOSIS — Z6832 Body mass index (BMI) 32.0-32.9, adult: Secondary | ICD-10-CM

## 2022-01-21 DIAGNOSIS — E669 Obesity, unspecified: Secondary | ICD-10-CM | POA: Diagnosis not present

## 2022-01-21 DIAGNOSIS — E559 Vitamin D deficiency, unspecified: Secondary | ICD-10-CM | POA: Diagnosis not present

## 2022-01-21 DIAGNOSIS — Z9189 Other specified personal risk factors, not elsewhere classified: Secondary | ICD-10-CM

## 2022-01-21 DIAGNOSIS — R7303 Prediabetes: Secondary | ICD-10-CM

## 2022-01-21 DIAGNOSIS — I10 Essential (primary) hypertension: Secondary | ICD-10-CM | POA: Diagnosis not present

## 2022-01-21 MED ORDER — WEGOVY 0.5 MG/0.5ML ~~LOC~~ SOAJ
0.5000 mg | SUBCUTANEOUS | 0 refills | Status: DC
  Filled 2022-01-21: qty 2, 30d supply, fill #0
  Filled 2022-01-29: qty 2, 28d supply, fill #0

## 2022-01-21 NOTE — Progress Notes (Signed)
Chief Complaint:   OBESITY Yvonne [name] is here to discuss her progress with her obesity treatment plan along with follow-up of her obesity related diagnoses. Yvonne [name] is on following a lower carbohydrate, vegetable and lean protein rich diet plan and states she is following her eating plan approximately 80% of the time. Yvonne [name] states she is walking the dog for 30 minutes 5 times per week.  Today's visit was #: 22 Starting weight: 204 lbs Starting date: 05/17/2017 Today's weight: 196 lbs Today's date: 01/21/2022 Total lbs lost to date: 8 Total lbs lost since last in-office visit: 0  Interim History: Yvonne [name] is recovering from surgery. She is getting back on track and she is ready to work on weight loss again.  Subjective:   1. Essential hypertension Yvonne [name]'s blood pressure is well controlled, and she had her hydrochlorothiazide changed to lisinopril, and she is doing well.  2. Vitamin D deficiency Yvonne [name] is on Vit D, and she requests a refill. She denies side effects.  3. Pre-diabetes Yvonne [name] had been on Ozempic and she was doing well with diet and weight loss. Her insurance will no longer cover Ozempic without diabetes mellitus.   4. At risk of diabetes mellitus Yvonne [name] is at higher than average risk for developing diabetes due to her obesity.  Assessment/Plan:   1. Essential hypertension Yvonne [name] will continue lisinopril as is, and she will continue working on healthy weight loss and exercise to improve blood pressure control. We will continue to follow as she continues her lifestyle modifications.  2. Vitamin D deficiency Low Vitamin D level contributes to fatigue and are associated with obesity, breast, and colon cancer. We will refill prescription Vitamin D 50,000 IU every week #4 for 1 month. Yvonne [name] will follow-up for routine testing of Vitamin D, at least 2-3 times per year to avoid over-replacement.  3. Pre-diabetes Yvonne [name] agreed to discontinue Ozempic, and start Wegovy. We  will follow up at her next visit. She will continue to work on weight loss, exercise, and decreasing simple carbohydrates to help decrease the risk of diabetes.   4. At risk of diabetes mellitus Yvonne [name] was given approximately 15 minutes of diabetic education and counseling today. We discussed intensive lifestyle modifications today with an emphasis on weight loss as well as increasing exercise and decreasing simple carbohydrates in her diet. We also reviewed medication options with an emphasis on risk versus benefits of those discussed.  Repetitive spaced learning was employed today to elicit superior memory formation and behavioral change.  5. Obesity BMI today is 32.6 Yvonne [name] is currently in the action stage of change. As such, her goal is to continue with weight loss efforts. She has agreed to change to following a lower carbohydrate, vegetable and lean protein rich diet plan.   We discussed various medication options to help Yvonne [name] with her weight loss efforts and we both agreed to start Wegovy 0.5 mg weekly with no refills.  - Semaglutide-Weight Management (WEGOVY) 0.5 MG/0.5ML SOAJ; Inject 0.5 mg into the skin once a week.  Dispense: 2 mL; Refill: 0  Exercise goals: As is.  Behavioral modification strategies: increasing lean protein intake and decreasing simple carbohydrates.  Yvonne [name] has agreed to follow-up with our clinic in 4 weeks. She was informed of the importance of frequent follow-up visits to maximize her success with intensive lifestyle modifications for her multiple health conditions.   Objective:   Blood pressure 128/89, pulse (!) 107, temperature 98 F (36.7 C), height 5\' 5"  (1.651 m), weight 196 lb (  88.9 kg), SpO2 100 %. Body mass index is 32.62 kg/m.  General: Cooperative, alert, well developed, in no acute distress. HEENT: Conjunctivae and lids unremarkable. Cardiovascular: Regular rhythm.  Lungs: Normal work of breathing. Neurologic: No focal deficits.   Lab  Results  Component Value Date   CREATININE 0.94 01/12/2022   BUN 25 (H) 01/12/2022   NA 141 01/12/2022   K 4.2 01/12/2022   CL 106 01/12/2022   CO2 27 01/12/2022   Lab Results  Component Value Date   ALT 17 01/12/2022   AST 12 01/12/2022   ALKPHOS 82 01/12/2022   BILITOT 0.3 01/12/2022   Lab Results  Component Value Date   HGBA1C 5.7 (H) 11/26/2021   HGBA1C 5.8 (H) 10/07/2021   HGBA1C 6.1 (H) 01/08/2021   HGBA1C 5.9 (H) 11/09/2019   HGBA1C 5.9 (H) 12/26/2018   Lab Results  Component Value Date   INSULIN 55.8 (H) 11/26/2021   INSULIN 49.9 (H) 10/07/2021   INSULIN 37.3 (H) 01/08/2021   INSULIN 33.2 (H) 11/09/2019   INSULIN 9.3 12/26/2018   Lab Results  Component Value Date   TSH 1.980 10/07/2021   Lab Results  Component Value Date   CHOL 206 (H) 11/26/2021   HDL 65 11/26/2021   LDLCALC 122 (H) 11/26/2021   TRIG 106 11/26/2021   CHOLHDL 3.4 04/28/2019   Lab Results  Component Value Date   VD25OH 56.0 11/26/2021   VD25OH 71.9 10/07/2021   VD25OH 21.8 (L) 01/08/2021   Lab Results  Component Value Date   WBC 8.8 01/12/2022   HGB 12.1 01/12/2022   HCT 36.6 01/12/2022   MCV 94.6 01/12/2022   PLT 338.0 01/12/2022   Lab Results  Component Value Date   IRON 77 01/12/2022   FERRITIN 131.2 01/12/2022   Attestation Statements:   Reviewed by clinician on day of visit: allergies, medications, problem list, medical history, surgical history, family history, social history, and previous encounter notes.   I, Trixie Dredge, am acting as transcriptionist for Dennard Nip, MD.  I have reviewed the above documentation for accuracy and completeness, and I agree with the above. -  Dennard Nip, MD

## 2022-01-22 ENCOUNTER — Telehealth (INDEPENDENT_AMBULATORY_CARE_PROVIDER_SITE_OTHER): Payer: Self-pay

## 2022-01-22 ENCOUNTER — Other Ambulatory Visit (HOSPITAL_COMMUNITY): Payer: Self-pay

## 2022-01-22 NOTE — Telephone Encounter (Signed)
I called [employer] to have them fax an appeal letter for [facility] .  Pt notified, that PA was denied and an appeal will be done.

## 2022-01-23 ENCOUNTER — Encounter: Payer: 59 | Admitting: Family

## 2022-01-26 ENCOUNTER — Other Ambulatory Visit (HOSPITAL_COMMUNITY): Payer: Self-pay

## 2022-01-26 ENCOUNTER — Telehealth (INDEPENDENT_AMBULATORY_CARE_PROVIDER_SITE_OTHER): Payer: Self-pay

## 2022-01-26 MED ORDER — ERGOCALCIFEROL 1.25 MG (50000 UT) PO CAPS
50000.0000 [IU] | ORAL_CAPSULE | ORAL | 0 refills | Status: DC
  Filled 2022-01-26: qty 4, 28d supply, fill #0

## 2022-01-26 NOTE — Telephone Encounter (Signed)
Pt's insurance states that an appeal must be completed in order to reverse the denial of Wegovy. PA forms have been faxed to this office. Once received, they will be completed, signed by MD, if applicable, and faxed back to pt's insurance company.  Pt is aware that an appeal is being completed.

## 2022-01-26 NOTE — Progress Notes (Signed)
[facility] Initial Assessment:    Date:  01/27/2022   ID:  Yvonne [name], DOB 1971/11/01, MRN [id number]  PCP:  Debbrah Alar, NP  Cardiologist:  None  Nephrologist:  Referring MD: Servando Salina, MD   CC: Hypertension  History of Present Illness:    Yvonne [name] is a 51 y.o. female with a hx of hypertension, pre-diabetes, anemia, arthritis, and nephrolithiasis, here to establish care in the [facility]. She has been working with [facility] [facility] but has struggled to lose weight. She saw Dr. Garwin Brothers and her blood pressure was elevated so she was referred to cardiology. She saw Dr. Leafy Ro 01/21/2022 and was doing well. Her hypertension was well controlled at 128/89. HCTZ had been switched to lisinopril on 01/16/2022.  Today, she is doing well. Recently she underwent emergent surgery for right ovarian torsion. At this time she has recovered well. Gradually she is returning to her exercise routines and will return to [facility] as well. She also exercises with walking her dog. Sometimes she experiences shortness of breath, which she attributes to [place] in [place]. She does not monitor her blood pressure at home. It has been monitored with [facility]. Generally she feels the same when at home. She reports that HCTZ was switched to lisinopril due to proteinuria. At times, she has felt like her throat was swollen. She is still able to swallow, but the feeling is noticeable. In the past she has been able to come off of her antihypertensives after losing weight. For her diet, she occasionally seasons with salt while cooking, but does add any salt afterwards. She often cooks with lemon pepper. She has stopped drinking caffeinated beverages aside from soda about 1 or 2 times a month. She does not drink alcohol and has never smoked. For pain management she takes Tylenol or ibuprofen. Previously her  husband has shaken her awake when she seemed to stop breathing at night. However, he has also noticed that her snoring and dyspnea is much improved when she loses weight. She feels well-rested in the mornings and denies daytime somnolence. She denies any palpitations, chest pain, or peripheral edema. No lightheadedness, headaches, syncope, or orthopnea.  Previous antihypertensives: HCTZ  Past Medical History:  Diagnosis Date   Anemia    Arthritis    High blood pressure    Joint pain    Kidney stones    Microscopic hematuria    Ovarian torsion 2022   Vitamin D deficiency     Past Surgical History:  Procedure Laterality Date   LAPAROTOMY N/A 12/02/2021   Procedure: EXPLORATORY LAPAROTOMY,REMOVAL OF RIGHT TUBE AND OVARY;  Surgeon: Lafonda Mosses, MD;  Location: [facility];  Service: Gynecology;  Laterality: N/A;   NO PAST SURGERIES     Denies surgical history    Current Medications: Current Meds  Medication Sig   losartan (COZAAR) 25 MG tablet Take 1 tablet (25 mg total) by mouth daily.   norethindrone (MICRONOR) 0.35 MG tablet Take 1 tablet by mouth daily.   Semaglutide-Weight Management (WEGOVY) 0.5 MG/0.5ML SOAJ Inject 0.5 mg into the skin once a week.   Vitamin D, Ergocalciferol, (DRISDOL) 1.25 MG (50000 UNIT) CAPS capsule Take 1 capsule by mouth every 7  days.   [DISCONTINUED] lisinopril (ZESTRIL) 5 MG tablet Take 1 tablet (5 mg total) by mouth daily.     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    Employer: [employer]  Tobacco Use   Smoking status: Never   Smokeless tobacco: Never  Vaping Use   Vaping Use: Never used  Substance and Sexual Activity   Alcohol use: No   Drug use: No   Sexual activity: Yes    Partners: Male     Birth control/protection: Pill  Other Topics Concern   Not on file  Social History Narrative   5 children (3 are out of the house) 3 biiological and 2 step   Married to [employer]   Works in patient Access at [employer]   12/16/21 lives with husband one daughter at home who is 62 yr            Social Determinants of Radio broadcast assistant Strain: Low Risk    Difficulty of Paying Living Expenses: Not hard at all  Food Insecurity: No Food Insecurity   Worried About Charity fundraiser in the Last Year: Never true   Arboriculturist in the Last Year: Never true  Transportation Needs: No Transportation Needs   Lack of Transportation (Medical): No   Lack of Transportation (Non-Medical): No  Physical Activity: Sufficiently Active   Days of Exercise per Week: 5 days   Minutes of Exercise per Session: 30 min  Stress: Not on file  Social Connections: Not on file     Family History: The patient's family history includes Cancer in her maternal grandfather, maternal grandmother, and mother; Congestive Heart Failure in her father; Coronary artery disease in her father; Diabetes in her father and paternal grandmother; Healthy in her father; Heart disease in her father; Multiple myeloma in her maternal grandmother; Other in her mother; Prostate cancer in her paternal grandmother. There is no history of Colon cancer, Esophageal cancer, Liver cancer, Pancreatic cancer, Rectal cancer, or Stomach cancer.  ROS:   Please see the history of present illness.    (+) Shortness of breath All other systems reviewed and are negative.  EKGs/Labs/Other Studies Reviewed:    Echo 07/08/2017: Study Conclusions   - Left ventricle: The cavity size was normal. Systolic function was    normal. The estimated ejection fraction was in the range of 50%    to 55%. Wall motion was normal; there were no regional wall    motion abnormalities. Left ventricular diastolic function    parameters were normal.  -  Atrial septum: A patent foramen ovale cannot be excluded.   EKG:   01/27/2022: EKG was not ordered.   Recent Labs: 10/07/2021: TSH 1.980 12/06/2021: Magnesium 2.1 01/12/2022: ALT 17; BUN 25; Creatinine, Ser 0.94; Hemoglobin 12.1; Platelets 338.0; Potassium 4.2; Sodium 141   Recent Lipid Panel    Component Value Date/Time   CHOL 206 (H) 11/26/2021 0739   TRIG 106 11/26/2021 0739   HDL 65 11/26/2021 0739   CHOLHDL 3.4 04/28/2019 1457   VLDL 13.2 03/30/2018 0744   LDLCALC 122 (H) 11/26/2021 0739   LDLCALC 127 (H) 04/28/2019 1457    Physical Exam:    VS:  BP 124/86 (BP Location: Right Arm, Patient Position: Sitting, Cuff Size: Large)    Pulse 89    Ht $R'5\' 5"'iF$  (1.651 m)    Wt 203 lb 3.2 oz (  92.2 kg)    BMI 33.81 kg/m  , BMI Body mass index is 33.81 kg/m. GENERAL:  Well appearing HEENT: Pupils equal round and reactive, fundi not visualized, oral mucosa unremarkable NECK:  No jugular venous distention, waveform within normal limits, carotid upstroke brisk and symmetric, no bruits, no thyromegaly LUNGS:  Clear to auscultation bilaterally HEART:  RRR.  PMI not displaced or sustained,S1 and S2 within normal limits, no S3, no S4, no clicks, no rubs, no murmurs ABD:  Flat, positive bowel sounds normal in frequency in pitch, no bruits, no rebound, no guarding, no midline pulsatile mass, no hepatomegaly, no splenomegaly EXT:  2 plus pulses throughout, no edema, no cyanosis no clubbing SKIN:  No rashes no nodules NEURO:  Cranial nerves II through XII grossly intact, motor grossly intact throughout PSYCH:  Cognitively intact, oriented to person place and time   ASSESSMENT/PLAN:    Hypertension Blood pressure was nearly controlled today.  Her diastolic blood pressure was slightly above her goal of less than 80.  She is getting back into the swing of exercise.  She is also working with [facility].  She notes that whenever her weight goes down her blood pressure is better.  No  secondary work-up indicated at this time.  It does sound she may have sleep apnea, though this also improves with weight loss.  Therefore we will continue to monitor for now.  She was recently started on lisinopril due to proteinuria.  She notes an abnormal sensation in her throat since starting it.  She has not had any cough.  We will try switching her to losartan 25 mg daily.  She is already scheduled to have labs checked with her healthy weight and wellness team.  Hyperlipidemia Lipids are borderline.  ASCVD 10 year risk is 2%.  Therefore no plans for statin at this time.  Keep working on diet and exercise.    Screening for Secondary Hypertension:  Causes 01/27/2022  Drugs/Herbals Screened     - Comments limited caffeine and sodium.  No EtOH.  Renovascular HTN N/A  Sleep Apnea Screened     - Comments snoring, apnea  Thyroid Disease Screened  Hyperaldosteronism N/A  Pheochromocytoma N/A  Cushing's Syndrome N/A  Hyperparathyroidism N/A  Coarctation of the Aorta Screened     - Comments BP ssymmetric  Compliance Screened    Relevant Labs/Studies: Basic Labs Latest Ref Rng & Units 01/12/2022 12/06/2021 12/05/2021  Sodium 135 - 145 mEq/L 141 138 138  Potassium 3.5 - 5.1 mEq/L 4.2 4.1 4.3  Creatinine 0.40 - 1.20 mg/dL 0.94 1.16(H) 1.50(H)    Thyroid  Latest Ref Rng & Units 10/07/2021 04/28/2019  TSH 0.450 - 4.500 uIU/mL 1.980 0.77     Disposition:    FU with Aylinn Rydberg C. Oval Linsey, MD, [facility] in 1 year.   Medication Adjustments/Labs and Tests Ordered: Current medicines are reviewed at length with the patient today.  Concerns regarding medicines are outlined above.   No orders of the defined types were placed in this encounter.  Meds ordered this encounter  Medications   losartan (COZAAR) 25 MG tablet    Sig: Take 1 tablet (25 mg total) by mouth daily.    Dispense:  90 tablet    Refill:  3    D/C LISINOPRIL   I,Mathew Stumpf,acting as a scribe for Skeet Latch, MD.,have  documented all relevant documentation on the behalf of Skeet Latch, MD,as directed by  Skeet Latch, MD while in the presence of Izek Corvino  Oval Linsey, MD.  I, Seleena Reimers C. Oval Linsey, MD have reviewed all documentation for this visit.  The documentation of the exam, diagnosis, procedures, and orders on 01/27/2022 are all accurate and complete.   Signed, Skeet Latch, MD  01/27/2022 12:46 PM    [place]

## 2022-01-27 ENCOUNTER — Other Ambulatory Visit: Payer: Self-pay

## 2022-01-27 ENCOUNTER — Encounter (HOSPITAL_BASED_OUTPATIENT_CLINIC_OR_DEPARTMENT_OTHER): Payer: Self-pay | Admitting: Cardiovascular Disease

## 2022-01-27 ENCOUNTER — Other Ambulatory Visit (HOSPITAL_COMMUNITY): Payer: Self-pay

## 2022-01-27 ENCOUNTER — Ambulatory Visit (HOSPITAL_BASED_OUTPATIENT_CLINIC_OR_DEPARTMENT_OTHER): Payer: 59 | Admitting: Cardiovascular Disease

## 2022-01-27 DIAGNOSIS — E7800 Pure hypercholesterolemia, unspecified: Secondary | ICD-10-CM

## 2022-01-27 DIAGNOSIS — I10 Essential (primary) hypertension: Secondary | ICD-10-CM

## 2022-01-27 MED ORDER — LOSARTAN POTASSIUM 25 MG PO TABS
25.0000 mg | ORAL_TABLET | Freq: Every day | ORAL | 3 refills | Status: DC
  Filled 2022-01-27: qty 90, 90d supply, fill #0
  Filled 2022-04-13: qty 90, 90d supply, fill #1
  Filled 2022-07-31: qty 90, 90d supply, fill #2
  Filled 2022-10-24: qty 90, 90d supply, fill #3

## 2022-01-27 NOTE — Patient Instructions (Signed)
Medication Instructions:  STOP LISINOPRIL   START LOSARTAN 25 MG DAILY   *If you need a refill on your cardiac medications before your next appointment, please call your pharmacy*  Lab Work: NONE  Testing/Procedures: NONE   Follow-Up: At [facility], you and your health needs are our priority.  As part of our continuing mission to provide you with exceptional heart care, we have created designated Provider Care Teams.  These Care Teams include your primary Cardiologist (physician) and Advanced Practice Providers (APPs -  Physician Assistants and Nurse Practitioners) who all work together to provide you with the care you need, when you need it.  We recommend signing up for the patient portal called "MyChart".  Sign up information is provided on this After Visit Summary.  MyChart is used to connect with patients for Virtual Visits (Telemedicine).  Patients are able to view lab/test results, encounter notes, upcoming appointments, etc.  Non-urgent messages can be sent to your provider as well.   To learn more about what you can do with MyChart, go to [email or web address].    Your next appointment:   12 month(s)  The format for your next appointment:   In Person  Provider:   Skeet Latch, MD

## 2022-01-27 NOTE — Assessment & Plan Note (Signed)
Lipids are borderline.  ASCVD 10 year risk is 2%.  Therefore no plans for statin at this time.  Keep working on diet and exercise.

## 2022-01-27 NOTE — Assessment & Plan Note (Signed)
Blood pressure was nearly controlled today.  Her diastolic blood pressure was slightly above her goal of less than 80.  She is getting back into the swing of exercise.  She is also working with healthy weight and wellness.  She notes that whenever her weight goes down her blood pressure is better.  No secondary work-up indicated at this time.  It does sound she may have sleep apnea, though this also improves with weight loss.  Therefore we will continue to monitor for now.  She was recently started on lisinopril due to proteinuria.  She notes an abnormal sensation in her throat since starting it.  She has not had any cough.  We will try switching her to losartan 25 mg daily.  She is already scheduled to have labs checked with her healthy weight and wellness team.

## 2022-01-28 ENCOUNTER — Telehealth (INDEPENDENT_AMBULATORY_CARE_PROVIDER_SITE_OTHER): Payer: Self-pay

## 2022-01-28 NOTE — Telephone Encounter (Signed)
PA appeal was faxed to [employer] for recent denial of pt's [facility] prescription.

## 2022-01-28 NOTE — Telephone Encounter (Signed)
I called [employer] to inquire about the denial, and was informed that the appeal was still pending.

## 2022-01-29 ENCOUNTER — Other Ambulatory Visit (HOSPITAL_BASED_OUTPATIENT_CLINIC_OR_DEPARTMENT_OTHER): Payer: Self-pay

## 2022-01-30 ENCOUNTER — Ambulatory Visit (INDEPENDENT_AMBULATORY_CARE_PROVIDER_SITE_OTHER): Payer: 59 | Admitting: Family

## 2022-01-30 ENCOUNTER — Other Ambulatory Visit (INDEPENDENT_AMBULATORY_CARE_PROVIDER_SITE_OTHER): Payer: 59

## 2022-01-30 VITALS — BP 127/79 | HR 87

## 2022-01-30 DIAGNOSIS — I10 Essential (primary) hypertension: Secondary | ICD-10-CM

## 2022-01-30 LAB — BASIC METABOLIC PANEL
BUN: 23 mg/dL (ref 6–23)
CO2: 30 mEq/L (ref 19–32)
Calcium: 11.2 mg/dL — ABNORMAL HIGH (ref 8.4–10.5)
Chloride: 106 mEq/L (ref 96–112)
Creatinine, Ser: 0.94 mg/dL (ref 0.40–1.20)
GFR: 71.31 mL/min (ref >60.00)
Glucose, Bld: 80 mg/dL (ref 70–99)
Potassium: 4.6 mEq/L (ref 3.5–5.1)
Sodium: 140 mEq/L (ref 135–145)

## 2022-01-30 NOTE — Progress Notes (Signed)
Pt here for Blood pressure check per Debbrah Alar, NP  Pt currently takes: Lisnopril 5 mg (last one taken last night)  losartan 25 mg (hasn't started yet, will start tonight). Cardiology switched these medications.  Pt reports compliance with medication.   BP Readings from Last 3 Encounters:  01/27/22 124/86  01/21/22 128/89  01/12/22 108/78      BP today: L arm 130/80, R arm 127/79 HR : 87

## 2022-02-02 LAB — PARATHYROID HORMONE, INTACT (NO CA): PTH: 44 pg/mL (ref 16–77)

## 2022-02-04 ENCOUNTER — Telehealth: Payer: Self-pay | Admitting: Family

## 2022-02-04 NOTE — Telephone Encounter (Signed)
Calcium remains elevated but parathyroid hormone is normal. I would like her to return for some additional vit D testing. Order has been placed.  ?

## 2022-02-05 NOTE — Telephone Encounter (Signed)
Patient advised to discontinue Vit D and scheduled to come in tomorrow for labs ?

## 2022-02-06 ENCOUNTER — Other Ambulatory Visit: Payer: 59

## 2022-02-06 ENCOUNTER — Other Ambulatory Visit (HOSPITAL_COMMUNITY): Payer: Self-pay

## 2022-02-09 ENCOUNTER — Other Ambulatory Visit (INDEPENDENT_AMBULATORY_CARE_PROVIDER_SITE_OTHER): Payer: 59

## 2022-02-10 LAB — CALCITRIOL (1,25 DI-OH VIT D): Vit D, 1,25-Dihydroxy: 41 pg/mL (ref 24.8–81.5)

## 2022-02-12 ENCOUNTER — Encounter: Payer: Self-pay | Admitting: Family

## 2022-02-13 MED ORDER — VITAMIN D3 75 MCG (3000 UT) PO TABS: 1.0000 | ORAL_TABLET | Freq: Every day | ORAL | Status: DC

## 2022-02-17 ENCOUNTER — Ambulatory Visit (INDEPENDENT_AMBULATORY_CARE_PROVIDER_SITE_OTHER): Payer: 59 | Admitting: Family Medicine

## 2022-02-17 ENCOUNTER — Other Ambulatory Visit (HOSPITAL_COMMUNITY): Payer: Self-pay

## 2022-02-17 ENCOUNTER — Encounter (INDEPENDENT_AMBULATORY_CARE_PROVIDER_SITE_OTHER): Payer: Self-pay | Admitting: Family Medicine

## 2022-02-17 VITALS — BP 106/75 | HR 81 | Temp 97.7°F | Ht 65.0 in | Wt 193.0 lb

## 2022-02-17 DIAGNOSIS — E559 Vitamin D deficiency, unspecified: Secondary | ICD-10-CM | POA: Diagnosis not present

## 2022-02-17 DIAGNOSIS — Z6832 Body mass index (BMI) 32.0-32.9, adult: Secondary | ICD-10-CM | POA: Diagnosis not present

## 2022-02-17 DIAGNOSIS — I10 Essential (primary) hypertension: Secondary | ICD-10-CM

## 2022-02-17 DIAGNOSIS — Z9189 Other specified personal risk factors, not elsewhere classified: Secondary | ICD-10-CM

## 2022-02-17 DIAGNOSIS — E669 Obesity, unspecified: Secondary | ICD-10-CM

## 2022-02-17 MED ORDER — SEMAGLUTIDE-WEIGHT MANAGEMENT 1 MG/0.5ML ~~LOC~~ SOAJ
1.0000 mg | SUBCUTANEOUS | 0 refills | Status: DC
Start: 2022-04-16 — End: 2022-03-17
  Filled 2022-02-17: qty 2, 28d supply, fill #0

## 2022-02-19 DIAGNOSIS — N912 Amenorrhea, unspecified: Secondary | ICD-10-CM | POA: Diagnosis not present

## 2022-02-23 NOTE — Progress Notes (Signed)
? ? ? ?Chief Complaint:  ? ?OBESITY ?Yvonne [name] is here to discuss her progress with her obesity treatment plan along with follow-up of her obesity related diagnoses. Yvonne [name] is on following a lower carbohydrate, vegetable and lean protein rich diet plan and states she is following her eating plan approximately 80% of the time. Yvonne [name] states she is walking the dog  for 30 minutes 7 times per week. ? ?Today's visit was #: 90 ?Starting weight: 204 lbs ?Starting date: 05/17/2017 ?Today's weight: 193 lbs ?Today's date: 02/17/2022 ?Total lbs lost to date: 11 ?Total lbs lost since last in-office visit: 3 ? ?Interim History: Yvonne [name] continues to do well with weight loss. She is working on increasing her vegetables and decreasing simple carbohydrates. She feels well overall, and she is avoiding snacking while working at home. ? ?Subjective:  ? ?1. Vitamin D deficiency ?Yvonne [name] is on Vit D prescription and she has a history of hypocalcemia, but her recent labs show hypercalcemia which per her PCP is currently working up. ? ?2. Essential hypertension ?Yvonne [name]'s blood pressure is well controlled on Cozaar. She has a cough on lisinopril and she was changed recently. She has no signs of hypotension. ? ?3. At risk for complication associated with hypotension ?Yvonne [name] is at a higher than average risk of hypotension due to continued weight loss. ? ?Assessment/Plan:  ? ?1. Vitamin D deficiency ?Yvonne [name] will hold prescription Vitamin D for now until the cause of hypercalcemia is found and fixed. We will continue to follow.  ? ?2. Essential hypertension ?Yvonne [name] will continue with diet and exercise. We will follow as she continues her lifestyle modifications. ? ?3. At risk for complication associated with hypotension ?Yvonne [name] was given approximately 15 minutes of education and counseling today to help avoid hypotension. We discussed risks of hypotension with weight loss and signs of hypotension such as feeling lightheaded or  unsteady. ? ?Repetitive spaced learning was employed today to elicit superior memory formation and behavioral change. ? ?4. Obesity BMI today is 32.1 ?Yvonne [name] is currently in the action stage of change. As such, her goal is to continue with weight loss efforts. She has agreed to following a lower carbohydrate, vegetable and lean protein rich diet plan.  ? ?We discussed various medication options to help Yvonne [name] with her weight loss efforts and we both agreed to increase Wegovy to 1 mg, and we will refill for 1 month. ? ?- Semaglutide-Weight Management 1 MG/0.5ML SOAJ; Inject 1 mg into the skin once a week for 28 days.  Dispense: 2 mL; Refill: 0 ? ?Exercise goals: As is.  ? ?Behavioral modification strategies: increasing lean protein intake and meal planning and cooking strategies. ? ?Yvonne [name] has agreed to follow-up with our clinic in 4 weeks. She was informed of the importance of frequent follow-up visits to maximize her success with intensive lifestyle modifications for her multiple health conditions.  ? ?Objective:  ? ?Blood pressure 106/75, pulse 81, temperature 97.7 ?F (36.5 ?C), height '5\' 5"'$  (1.651 m), weight 193 lb (87.5 kg), SpO2 98 %. ?Body mass index is 32.12 kg/m?. ? ?General: Cooperative, alert, well developed, in no acute distress. ?HEENT: Conjunctivae and lids unremarkable. ?Cardiovascular: Regular rhythm.  ?Lungs: Normal work of breathing. ?Neurologic: No focal deficits.  ? ?Lab Results  ?Component Value Date  ? CREATININE 0.94 01/30/2022  ? BUN 23 01/30/2022  ? NA 140 01/30/2022  ? K 4.6 01/30/2022  ? CL 106 01/30/2022  ? CO2 30 01/30/2022  ? ?Lab Results  ?Component Value Date  ?  ALT 17 01/12/2022  ? AST 12 01/12/2022  ? ALKPHOS 82 01/12/2022  ? BILITOT 0.3 01/12/2022  ? ?Lab Results  ?Component Value Date  ? HGBA1C 5.7 (H) 11/26/2021  ? HGBA1C 5.8 (H) 10/07/2021  ? HGBA1C 6.1 (H) 01/08/2021  ? HGBA1C 5.9 (H) 11/09/2019  ? HGBA1C 5.9 (H) 12/26/2018  ? ?Lab Results  ?Component Value Date  ? INSULIN 55.8  (H) 11/26/2021  ? INSULIN 49.9 (H) 10/07/2021  ? INSULIN 37.3 (H) 01/08/2021  ? INSULIN 33.2 (H) 11/09/2019  ? INSULIN 9.3 12/26/2018  ? ?Lab Results  ?Component Value Date  ? TSH 1.980 10/07/2021  ? ?Lab Results  ?Component Value Date  ? CHOL 206 (H) 11/26/2021  ? HDL 65 11/26/2021  ? LDLCALC 122 (H) 11/26/2021  ? TRIG 106 11/26/2021  ? CHOLHDL 3.4 04/28/2019  ? ?Lab Results  ?Component Value Date  ? VD25OH 56.0 11/26/2021  ? VD25OH 71.9 10/07/2021  ? VD25OH 21.8 (L) 01/08/2021  ? ?Lab Results  ?Component Value Date  ? WBC 8.8 01/12/2022  ? HGB 12.1 01/12/2022  ? HCT 36.6 01/12/2022  ? MCV 94.6 01/12/2022  ? PLT 338.0 01/12/2022  ? ?Lab Results  ?Component Value Date  ? IRON 77 01/12/2022  ? FERRITIN 131.2 01/12/2022  ? ?Attestation Statements:  ? ?Reviewed by clinician on day of visit: allergies, medications, problem list, medical history, surgical history, family history, social history, and previous encounter notes. ? ? ?I, Trixie Dredge, am acting as transcriptionist for Dennard Nip, MD. ? ?I have reviewed the above documentation for accuracy and completeness, and I agree with the above. -  Dennard Nip, MD ? ? ?

## 2022-02-24 ENCOUNTER — Ambulatory Visit: Payer: 59 | Admitting: Family

## 2022-02-24 ENCOUNTER — Other Ambulatory Visit: Payer: Self-pay | Admitting: Family

## 2022-02-24 DIAGNOSIS — L919 Hypertrophic disorder of the skin, unspecified: Secondary | ICD-10-CM | POA: Diagnosis not present

## 2022-02-24 DIAGNOSIS — D229 Melanocytic nevi, unspecified: Secondary | ICD-10-CM | POA: Diagnosis not present

## 2022-02-24 DIAGNOSIS — L918 Other hypertrophic disorders of the skin: Secondary | ICD-10-CM | POA: Diagnosis not present

## 2022-02-24 DIAGNOSIS — L821 Other seborrheic keratosis: Secondary | ICD-10-CM | POA: Diagnosis not present

## 2022-02-24 NOTE — Addendum Note (Signed)
Addended by: Jiles Prows on: 02/24/2022 09:56 AM ? ? Modules accepted: Orders ? ?

## 2022-02-24 NOTE — Progress Notes (Signed)
? ?Subjective:  ? ? ? Patient ID: Yvonne [name], female    DOB: [birth year], 50 y.o.   MRN: [id number] ? ?Chief Complaint  ?Patient presents with  ? Nevus  ?  Here for moles to be removed  ? ? ?HPI ?Patient is in today for mole removal.   ? ?Health Maintenance Due  ?Topic Date Due  ? Hepatitis C Screening  Never done  ? COVID-19 Vaccine (4 - Booster for Pfizer series) 02/14/2021  ? ? ?Past Medical History:  ?Diagnosis Date  ? Anemia   ? Arthritis   ? High blood pressure   ? Joint pain   ? Kidney stones   ? Microscopic hematuria   ? Ovarian torsion 2022  ? Vitamin D deficiency   ? ? ?Past Surgical History:  ?Procedure Laterality Date  ? LAPAROTOMY N/A 12/02/2021  ? Procedure: EXPLORATORY LAPAROTOMY,REMOVAL OF RIGHT TUBE AND OVARY;  Surgeon: Lafonda Mosses, MD;  Location: [facility];  Service: Gynecology;  Laterality: N/A;  ? NO PAST SURGERIES    ? Denies surgical history  ? ? ?Family History  ?Problem Relation Age of Onset  ? Other Mother   ?     deceased from multiple myelomas  ? Cancer Mother   ? Healthy Father   ? Congestive Heart Failure Father   ? Heart disease Father   ?     CABG  ? Diabetes Father   ? Coronary artery disease Father   ? Cancer Maternal Grandmother   ? Multiple myeloma Maternal Grandmother   ? Cancer Maternal Grandfather   ?     unsure what type of cancer  ? Diabetes Paternal Grandmother   ? Prostate cancer Paternal Grandmother   ? Colon cancer Neg Hx   ? Esophageal cancer Neg Hx   ? Liver cancer Neg Hx   ? Pancreatic cancer Neg Hx   ? Rectal cancer Neg Hx   ? Stomach cancer Neg Hx   ? ? ?Social History  ? ?Socioeconomic History  ? Marital status: Married  ?  Spouse name: Not on file  ? Number of children: 3  ? Years of education: Not on file  ? Highest education level: Not on file  ?Occupational History  ? 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  Employer: [employer]  ?Tobacco Use  ? Smoking status: Never  ? Smokeless tobacco: Never  ?Vaping Use  ? Vaping Use: Never used  ?Substance and Sexual  Activity  ? Alcohol use: No  ? Drug use: No  ? Sexual activity: Yes  ?  Partners: Male  ?  Birth control/protection: Pill  ?Other Topics Concern  ? Not on file  ?Social History Narrative  ? 5 children (3 are out of the house) 3 biiological and 2 step  ? Married to [employer]  ? Works in patient Access at [employer]  ? 12/16/21 lives with husband one daughter at home who is 63 yr  ?   ?   ?   ? ?Social Determinants of Health  ? ?Financial Resource Strain: Low Risk   ? Difficulty of Paying Living Expenses: Not hard at all  ?Food Insecurity: No Food Insecurity  ? Worried About Charity fundraiser in the Last Year: Never true  ? Ran Out of Food in the Last Year: Never true  ?Transportation Needs: No Transportation Needs  ? Lack of Transportation (Medical): No  ? Lack of Transportation (Non-Medical): No  ?Physical Activity: Sufficiently Active  ?  Days of Exercise per Week: 5 days  ? Minutes of Exercise per Session: 30 min  ?Stress: Not on file  ?Social Connections: Not on file  ?Intimate Partner Violence: Not on file  ? ? ?Outpatient Medications Prior to Visit  ?Medication Sig Dispense Refill  ? Cholecalciferol (VITAMIN D3) 75 MCG (3000 UT) TABS Take 1 tablet by mouth daily. 30 tablet   ? losartan (COZAAR) 25 MG tablet Take 1 tablet (25 mg total) by mouth daily. ( Stop lisinopril) 90 tablet 3  ? norethindrone (MICRONOR) 0.35 MG tablet Take 1 tablet by mouth daily. 84 tablet 4  ? [START ON 04/16/2022] Semaglutide-Weight Management 1 MG/0.5ML SOAJ Inject 1 mg into the skin once a week for 28 days. 2 mL 0  ? ?No facility-administered medications prior to visit.  ? ? ?Allergies  ?Allergen Reactions  ? Red Blood Cells   ?  Jehovah's Witness, does not want to receive blood  ? ? ?ROS ?See HPI ?   ?Objective:  ?  ?Physical Exam ?Constitutional:   ?   Appearance: Normal appearance.  ?Skin: ?   General: Skin is warm and dry.  ?   Comments: Pedunculated nevus noted right neck (approximately 4 mm) and right axilla, approximately  27mm  ?Numerous small skin tags noted around neck line  ?Neurological:  ?   Mental Status: She is alert and oriented to person, place, and time.  ?Psychiatric:     ?   Mood and Affect: Mood normal.  ? ? ?BP (P) 127/80 (BP Location: Left Arm, Patient Position: Sitting, Cuff Size: Small)   Pulse (!) (P) 102   Temp (P) 97.9 ?F (36.6 ?C) (Oral)   Resp (P) 16   SpO2 (P) 98%  ?Wt Readings from Last 3 Encounters:  ?02/17/22 193 lb (87.5 kg)  ?01/27/22 203 lb 3.2 oz (92.2 kg)  ?01/21/22 196 lb (88.9 kg)  ? ? ?   ?Assessment & Plan:  ? ?Problem List Items Addressed This Visit   ? ?  ? Unprioritized  ? Skin tags, multiple acquired  ? Nevus - Primary  ?Procedure including risks/benefits explained to patient.  Questions were answered. After informed consent was obtained and a time out completed, site was cleansed with betadine. 1% Lidocaine with epinephrine was injected under lesion and then shave removal was performed of right neck lesion and right axillary lesion. Area was cauterized to obtain hemostasis.  Pt tolerated procedure well.  Specimen sent for pathology review.   ? ?We then used liquid nitrogen to freeze 25 small skin tags mostly at the base of her neck and one on her anterior chest. Pt instructed to keep the area dry for 24 hours and to contact us if he develops redness, drainage or swelling at the site.  Pt may use tylenol as needed for discomfort today.  ? ? ?I am having Yvonne [name] maintain her norethindrone, losartan, Vitamin D3, and Semaglutide-Weight Management. ? ?No orders of the defined types were placed in this encounter. ? ?

## 2022-03-17 ENCOUNTER — Other Ambulatory Visit (HOSPITAL_COMMUNITY): Payer: Self-pay

## 2022-03-17 ENCOUNTER — Encounter (INDEPENDENT_AMBULATORY_CARE_PROVIDER_SITE_OTHER): Payer: Self-pay | Admitting: Family Medicine

## 2022-03-17 ENCOUNTER — Ambulatory Visit (INDEPENDENT_AMBULATORY_CARE_PROVIDER_SITE_OTHER): Payer: 59 | Admitting: Family Medicine

## 2022-03-17 VITALS — BP 119/88 | HR 99 | Temp 97.8°F | Ht 65.0 in | Wt 192.0 lb

## 2022-03-17 DIAGNOSIS — E559 Vitamin D deficiency, unspecified: Secondary | ICD-10-CM

## 2022-03-17 DIAGNOSIS — E1169 Type 2 diabetes mellitus with other specified complication: Secondary | ICD-10-CM | POA: Diagnosis not present

## 2022-03-17 DIAGNOSIS — Z6832 Body mass index (BMI) 32.0-32.9, adult: Secondary | ICD-10-CM | POA: Diagnosis not present

## 2022-03-17 DIAGNOSIS — E669 Obesity, unspecified: Secondary | ICD-10-CM

## 2022-03-17 MED ORDER — SEMAGLUTIDE-WEIGHT MANAGEMENT 1 MG/0.5ML ~~LOC~~ SOAJ
1.0000 mg | SUBCUTANEOUS | 0 refills | Status: DC
  Filled 2022-03-17: qty 2, 28d supply, fill #0

## 2022-03-18 NOTE — Progress Notes (Signed)
? ? ? ?Chief Complaint:  ? ?OBESITY ?Alayzia is here to discuss her progress with her obesity treatment plan along with follow-up of her obesity related diagnoses. Braylynn is on following a lower carbohydrate, vegetable and lean protein rich diet plan and states she is following her eating plan approximately 80% of the time. Trey states she is walking the dog for 30 minutes 5 times per week. ? ?Today's visit was #: 81 ?Starting weight: 204 lbs ?Starting date: 05/17/2017 ?Today's weight: 192 lbs ?Today's date: 03/17/2022 ?Total lbs lost to date: 12 ?Total lbs lost since last in-office visit: 1 ? ?Interim History: Latacha continues to do well with weight loss. She increased her Wegovy from 0.5 mg to 1 mg and she feels her hunger is better controlled. She had been on 1.7 mg in the past.  ? ?Subjective:  ? ?1. Type 2 diabetes mellitus with other specified complication, without long-term current use of insulin ([place]) ?Larena is working on her diet and exercise. She is stable on GLP-1, and she has no signs of hypoglycemia.  ? ?2. Vitamin D deficiency ?Dori is off Vit D, and she is due to increased Ca+ with no side effects noted.  ? ?Assessment/Plan:  ? ?1. Type 2 diabetes mellitus with other specified complication, without long-term current use of insulin ([place]) ?Gisselle will continue GLP-1, and diet and exercise. We will recheck labs in 1-2 months. Good blood sugar control is important to decrease the likelihood of diabetic complications such as nephropathy, neuropathy, limb loss, blindness, coronary artery disease, and death. Intensive lifestyle modification including diet, exercise and weight loss are the first line of treatment for diabetes.  ? ?2. Vitamin D deficiency ?Nela will continue to hold Vitamin D, and plan to recheck Vitamin D, PTH, and Ca+ in 1-2 months. She will follow-up for routine testing of Vitamin D, at least 2-3 times per year to avoid over-replacement. ? ?3. Obesity BMI today is 32.0 ?Twila is  currently in the action stage of change. As such, her goal is to continue with weight loss efforts. She has agreed to following a lower carbohydrate, vegetable and lean protein rich diet plan.  ? ?We discussed various medication options to help [facility] with her weight loss efforts and we both agreed to continue Wegovy at 1 mg, and we will refill for 1 month. ? ?- Semaglutide-Weight Management 1 MG/0.5ML SOAJ; Inject 1 mg into the skin once a week for 28 days.  Dispense: 2 mL; Refill: 0 ? ?Exercise goals: As is. ? ?Behavioral modification strategies: increasing lean protein intake. ? ?Lititia has agreed to follow-up with our clinic in 4 weeks. She was informed of the importance of frequent follow-up visits to maximize her success with intensive lifestyle modifications for her multiple health conditions.  ? ?Objective:  ? ?Blood pressure 119/88, pulse 99, temperature 97.8 ?F (36.6 ?C), height '5\' 5"'$  (1.651 m), weight 192 lb (87.1 kg), SpO2 98 %. ?Body mass index is 31.95 kg/m?. ? ?General: Cooperative, alert, well developed, in no acute distress. ?HEENT: Conjunctivae and lids unremarkable. ?Cardiovascular: Regular rhythm.  ?Lungs: Normal work of breathing. ?Neurologic: No focal deficits.  ? ?Lab Results  ?Component Value Date  ? CREATININE 0.94 01/30/2022  ? BUN 23 01/30/2022  ? NA 140 01/30/2022  ? K 4.6 01/30/2022  ? CL 106 01/30/2022  ? CO2 30 01/30/2022  ? ?Lab Results  ?Component Value Date  ? ALT 17 01/12/2022  ? AST 12 01/12/2022  ? ALKPHOS 82 01/12/2022  ? BILITOT  0.3 01/12/2022  ? ?Lab Results  ?Component Value Date  ? HGBA1C 5.7 (H) 11/26/2021  ? HGBA1C 5.8 (H) 10/07/2021  ? HGBA1C 6.1 (H) 01/08/2021  ? HGBA1C 5.9 (H) 11/09/2019  ? HGBA1C 5.9 (H) 12/26/2018  ? ?Lab Results  ?Component Value Date  ? INSULIN 55.8 (H) 11/26/2021  ? INSULIN 49.9 (H) 10/07/2021  ? INSULIN 37.3 (H) 01/08/2021  ? INSULIN 33.2 (H) 11/09/2019  ? INSULIN 9.3 12/26/2018  ? ?Lab Results  ?Component Value Date  ? TSH 1.980 10/07/2021   ? ?Lab Results  ?Component Value Date  ? CHOL 206 (H) 11/26/2021  ? HDL 65 11/26/2021  ? LDLCALC 122 (H) 11/26/2021  ? TRIG 106 11/26/2021  ? CHOLHDL 3.4 04/28/2019  ? ?Lab Results  ?Component Value Date  ? VD25OH 56.0 11/26/2021  ? VD25OH 71.9 10/07/2021  ? VD25OH 21.8 (L) 01/08/2021  ? ?Lab Results  ?Component Value Date  ? WBC 8.8 01/12/2022  ? HGB 12.1 01/12/2022  ? HCT 36.6 01/12/2022  ? MCV 94.6 01/12/2022  ? PLT 338.0 01/12/2022  ? ?Lab Results  ?Component Value Date  ? IRON 77 01/12/2022  ? FERRITIN 131.2 01/12/2022  ? ?Attestation Statements:  ? ?Reviewed by clinician on day of visit: allergies, medications, problem list, medical history, surgical history, family history, social history, and previous encounter notes. ? ?Time spent on visit including pre-visit chart review and post-visit care and charting was 30 minutes.  ? ? ?I, Trixie Dredge, am acting as transcriptionist for Dennard Nip, MD. ? ?I have reviewed the above documentation for accuracy and completeness, and I agree with the above. -  Dennard Nip, MD ? ? ?

## 2022-03-27 DIAGNOSIS — H5203 Hypermetropia, bilateral: Secondary | ICD-10-CM | POA: Diagnosis not present

## 2022-03-27 DIAGNOSIS — H524 Presbyopia: Secondary | ICD-10-CM | POA: Diagnosis not present

## 2022-03-27 DIAGNOSIS — H40013 Open angle with borderline findings, low risk, bilateral: Secondary | ICD-10-CM | POA: Diagnosis not present

## 2022-03-27 LAB — HM DIABETES EYE EXAM

## 2022-04-10 ENCOUNTER — Telehealth: Payer: 59

## 2022-04-10 ENCOUNTER — Telehealth: Payer: Self-pay | Admitting: *Deleted

## 2022-04-10 NOTE — Chronic Care Management (AMB) (Signed)
  Care Coordination Note  04/10/2022 Name: [name] MRN: [id number] DOB: [birth year]  Yvonne [name] is a 51 y.o. year old female who is a primary care patient of Debbrah Alar, NP and is actively engaged with the care management team. I reached out to Yvonne [name] by phone today to assist with re-scheduling a follow up visit with the RN Case Manager  Follow up plan: Unsuccessful telephone outreach attempt made. A HIPAA compliant phone message was left for the patient providing contact information and requesting a return call.   Julian Hy, [place] Management  Direct Dial: [phone]

## 2022-04-13 ENCOUNTER — Other Ambulatory Visit (HOSPITAL_COMMUNITY): Payer: Self-pay

## 2022-04-14 ENCOUNTER — Encounter (INDEPENDENT_AMBULATORY_CARE_PROVIDER_SITE_OTHER): Payer: Self-pay | Admitting: Family Medicine

## 2022-04-14 ENCOUNTER — Other Ambulatory Visit (HOSPITAL_COMMUNITY): Payer: Self-pay

## 2022-04-14 ENCOUNTER — Ambulatory Visit (INDEPENDENT_AMBULATORY_CARE_PROVIDER_SITE_OTHER): Payer: 59 | Admitting: Family Medicine

## 2022-04-14 DIAGNOSIS — Z6832 Body mass index (BMI) 32.0-32.9, adult: Secondary | ICD-10-CM

## 2022-04-14 DIAGNOSIS — E669 Obesity, unspecified: Secondary | ICD-10-CM

## 2022-04-14 MED ORDER — SEMAGLUTIDE-WEIGHT MANAGEMENT 1.7 MG/0.75ML ~~LOC~~ SOAJ
1.7000 mg | SUBCUTANEOUS | 0 refills | Status: DC
  Filled 2022-04-14: qty 3, 28d supply, fill #0

## 2022-04-24 ENCOUNTER — Ambulatory Visit (HOSPITAL_BASED_OUTPATIENT_CLINIC_OR_DEPARTMENT_OTHER): Payer: 59 | Admitting: Cardiovascular Disease

## 2022-04-29 NOTE — Progress Notes (Signed)
Chief Complaint:   OBESITY Yvonne [name] is here to discuss her progress with her obesity treatment plan along with follow-up of her obesity related diagnoses. Yvonne [name] is on following a lower carbohydrate, vegetable and lean protein rich diet plan and states she is following her eating plan approximately (unknown)% of the time. Yvonne [name] states she is walking for 30 minutes 7 times per week.  Today's visit was #: 56 Starting weight: 204 lbs Starting date: 05/17/2017 Today's weight: 193 lbs Today's date: 04/14/2022 Total lbs lost to date: 11 Total lbs lost since last in-office visit: 0  Interim History: Yvonne [name] did some celebration eating this weekend, but otherwise she is doing well with her low carbohydrate plan. She is already getting back to her low carbohydrate plan.  Subjective:   1. Hypercalcemia Saran got off Vitamin D when her Ca+ started to increase. Her PCP recommended that she start OTC Vitamin D at 3,000 IU daily, but she hasn't started yet.  Assessment/Plan:   1. Hypercalcemia Yvonne [name] agreed to start Vitamin D OTC 3,000 IU daily, and we will recheck labs in 1 month.  2. Obesity, Current BMI 32.2 Yvonne [name] is currently in the action stage of change. As such, her goal is to continue with weight loss efforts. She has agreed to following a lower carbohydrate, vegetable and lean protein rich diet plan.   We discussed various medication options to help Yvonne [name] with her weight loss efforts and we both agreed to increase Wegovy to 1.7 mg  q week, and we will refill for 1 month.  - Semaglutide-Weight Management 1.7 MG/0.75ML SOAJ; Inject 1.7 mg into the skin once a week.  Dispense: 3 mL; Refill: 0  Exercise goals: As is.  Behavioral modification strategies: increasing lean protein intake.  Yvonne [name] has agreed to follow-up with our clinic in 3 to 4 weeks. She was informed of the importance of frequent follow-up visits to maximize her success with intensive lifestyle modifications for her  multiple health conditions.   Objective:   Blood pressure 118/85, pulse 94, temperature 97.6 F (36.4 C), height '5\' 5"'$  (1.651 m), weight 193 lb (87.5 kg), SpO2 100 %. Body mass index is 32.12 kg/m.  General: Cooperative, alert, well developed, in no acute distress. HEENT: Conjunctivae and lids unremarkable. Cardiovascular: Regular rhythm.  Lungs: Normal work of breathing. Neurologic: No focal deficits.   Lab Results  Component Value Date   CREATININE 0.94 01/30/2022   BUN 23 01/30/2022   NA 140 01/30/2022   K 4.6 01/30/2022   CL 106 01/30/2022   CO2 30 01/30/2022   Lab Results  Component Value Date   ALT 17 01/12/2022   AST 12 01/12/2022   ALKPHOS 82 01/12/2022   BILITOT 0.3 01/12/2022   Lab Results  Component Value Date   HGBA1C 5.7 (H) 11/26/2021   HGBA1C 5.8 (H) 10/07/2021   HGBA1C 6.1 (H) 01/08/2021   HGBA1C 5.9 (H) 11/09/2019   HGBA1C 5.9 (H) 12/26/2018   Lab Results  Component Value Date   INSULIN 55.8 (H) 11/26/2021   INSULIN 49.9 (H) 10/07/2021   INSULIN 37.3 (H) 01/08/2021   INSULIN 33.2 (H) 11/09/2019   INSULIN 9.3 12/26/2018   Lab Results  Component Value Date   TSH 1.980 10/07/2021   Lab Results  Component Value Date   CHOL 206 (H) 11/26/2021   HDL 65 11/26/2021   LDLCALC 122 (H) 11/26/2021   TRIG 106 11/26/2021   CHOLHDL 3.4 04/28/2019   Lab Results  Component Value Date  VD25OH 56.0 11/26/2021   VD25OH 71.9 10/07/2021   VD25OH 21.8 (L) 01/08/2021   Lab Results  Component Value Date   WBC 8.8 01/12/2022   HGB 12.1 01/12/2022   HCT 36.6 01/12/2022   MCV 94.6 01/12/2022   PLT 338.0 01/12/2022   Lab Results  Component Value Date   IRON 77 01/12/2022   FERRITIN 131.2 01/12/2022   Attestation Statements:   Reviewed by clinician on day of visit: allergies, medications, problem list, medical history, surgical history, family history, social history, and previous encounter notes.  Time spent on visit including pre-visit chart  review and post-visit care and charting was 30 minutes.    I, Trixie Dredge, am acting as transcriptionist for Dennard Nip, MD.  I have reviewed the above documentation for accuracy and completeness, and I agree with the above. -  Dennard Nip, MD

## 2022-05-12 ENCOUNTER — Ambulatory Visit (INDEPENDENT_AMBULATORY_CARE_PROVIDER_SITE_OTHER): Payer: 59 | Admitting: Family Medicine

## 2022-05-12 ENCOUNTER — Encounter (INDEPENDENT_AMBULATORY_CARE_PROVIDER_SITE_OTHER): Payer: Self-pay | Admitting: Family Medicine

## 2022-05-12 ENCOUNTER — Other Ambulatory Visit (HOSPITAL_COMMUNITY): Payer: Self-pay

## 2022-05-12 VITALS — BP 109/78 | HR 86 | Temp 98.0°F | Ht 65.0 in | Wt 194.0 lb

## 2022-05-12 DIAGNOSIS — Z6832 Body mass index (BMI) 32.0-32.9, adult: Secondary | ICD-10-CM | POA: Diagnosis not present

## 2022-05-12 DIAGNOSIS — E8881 Metabolic syndrome: Secondary | ICD-10-CM | POA: Diagnosis not present

## 2022-05-12 DIAGNOSIS — I10 Essential (primary) hypertension: Secondary | ICD-10-CM | POA: Diagnosis not present

## 2022-05-12 DIAGNOSIS — Z9189 Other specified personal risk factors, not elsewhere classified: Secondary | ICD-10-CM

## 2022-05-12 DIAGNOSIS — E669 Obesity, unspecified: Secondary | ICD-10-CM

## 2022-05-12 MED ORDER — SEMAGLUTIDE-WEIGHT MANAGEMENT 1.7 MG/0.75ML ~~LOC~~ SOAJ
1.7000 mg | SUBCUTANEOUS | 0 refills | Status: DC
  Filled 2022-05-12: qty 3, 28d supply, fill #0

## 2022-05-13 NOTE — Progress Notes (Signed)
Chief Complaint:   OBESITY Yvonne [name] is here to discuss her progress with her obesity treatment plan along with follow-up of her obesity related diagnoses. [name] is on following a lower carbohydrate, vegetable and lean protein rich diet plan and states she is following her eating plan approximately 70% of the time. [name] states she is walking the dog for 30-45 minutes 5 times per week.  Today's visit was #: 69 Starting weight: 204 lbs Starting date: 05/17/2017 Today's weight: 194 lbs Today's date: 05/12/2022 Total lbs lost to date: 10 Total lbs lost since last in-office visit: 0  Interim History: [name] has been struggling with increased cravings and she has been bringing in more high sugar foods to her home. She notes the benefit of Wegovy fades after approximately 4 days.   Subjective:   1. Insulin resistance [name] notes increased sugar in her diet which is fighting her weight loss efforts. She notes polyphagia.   2. Essential hypertension [name]'s blood pressure is stable on her medications, and she has no signs of hypotension.   3. At risk for diabetes mellitus [name] is at higher than average risk for developing diabetes due to her obesity.  Assessment/Plan:   1. Insulin resistance [name] was educated on the affects of various nutrients on her insulin resistance and the importance of decreasing simple carbohydrates to help decrease the risk of diabetes. [name] agreed to follow-up with [place] as directed to closely monitor her progress.  2. Essential hypertension [name] will continue Cozaar, diet and weight loss efforts to improve blood pressure control. She will watch for signs of hypotension as she continues her lifestyle modifications.  3. At risk for diabetes mellitus [name] was given approximately 15 minutes of diabetic education and counseling today. We discussed intensive lifestyle modifications today with an emphasis on weight loss as well as increasing exercise and  decreasing simple carbohydrates in her diet. We also reviewed medication options with an emphasis on risk versus benefits of those discussed.  Repetitive spaced learning was employed today to elicit superior memory formation and behavioral change.  4. Obesity, Current BMI 32.3 [name] is currently in the action stage of change. As such, her goal is to continue with weight loss efforts. She has agreed to change to keeping a food journal and adhering to recommended goals of 1300-1500 calories and 85 grams of protein daily.   We discussed various medication options to help [name] with her weight loss efforts and we both agreed to continue [place], and we will refill for 1 month. [name] is to move her dose up 2 days so it is strongest in her system when she does her grocery shopping.  - Semaglutide-Weight Management 1.7 MG/0.75ML SOAJ; Inject 1.7 mg into the skin once a week.  Dispense: 3 mL; Refill: 0  Exercise goals: As is.  Behavioral modification strategies: decreasing liquid calories.  [name] has agreed to follow-up with our clinic in 3 weeks. She was informed of the importance of frequent follow-up visits to maximize her success with intensive lifestyle modifications for her multiple health conditions.   Objective:   Blood pressure 109/78, pulse 86, temperature 98 F (36.7 C), height '5\' 5"'$  (1.651 m), weight 194 lb (88 kg), SpO2 98 %. Body mass index is 32.28 kg/m.  General: Cooperative, alert, well developed, in no acute distress. HEENT: Conjunctivae and lids unremarkable. Cardiovascular: Regular rhythm.  Lungs: Normal work of breathing. Neurologic: No focal deficits.   Lab Results  Component Value Date   CREATININE 0.94  01/30/2022   BUN 23 01/30/2022   NA 140 01/30/2022   K 4.6 01/30/2022   CL 106 01/30/2022   CO2 30 01/30/2022   Lab Results  Component Value Date   ALT 17 01/12/2022   AST 12 01/12/2022   ALKPHOS 82 01/12/2022   BILITOT 0.3 01/12/2022   Lab Results   Component Value Date   HGBA1C 5.7 (H) 11/26/2021   HGBA1C 5.8 (H) 10/07/2021   HGBA1C 6.1 (H) 01/08/2021   HGBA1C 5.9 (H) 11/09/2019   HGBA1C 5.9 (H) 12/26/2018   Lab Results  Component Value Date   INSULIN 55.8 (H) 11/26/2021   INSULIN 49.9 (H) 10/07/2021   INSULIN 37.3 (H) 01/08/2021   INSULIN 33.2 (H) 11/09/2019   INSULIN 9.3 12/26/2018   Lab Results  Component Value Date   TSH 1.980 10/07/2021   Lab Results  Component Value Date   CHOL 206 (H) 11/26/2021   HDL 65 11/26/2021   LDLCALC 122 (H) 11/26/2021   TRIG 106 11/26/2021   CHOLHDL 3.4 04/28/2019   Lab Results  Component Value Date   VD25OH 56.0 11/26/2021   VD25OH 71.9 10/07/2021   VD25OH 21.8 (L) 01/08/2021   Lab Results  Component Value Date   WBC 8.8 01/12/2022   HGB 12.1 01/12/2022   HCT 36.6 01/12/2022   MCV 94.6 01/12/2022   PLT 338.0 01/12/2022   Lab Results  Component Value Date   IRON 77 01/12/2022   FERRITIN 131.2 01/12/2022   Attestation Statements:   Reviewed by clinician on day of visit: allergies, medications, problem list, medical history, surgical history, family history, social history, and previous encounter notes.   I, Trixie Dredge, am acting as transcriptionist for Dennard Nip, MD.  I have reviewed the above documentation for accuracy and completeness, and I agree with the above. -  Dennard Nip, MD

## 2022-05-15 NOTE — Chronic Care Management (AMB) (Signed)
  Care Coordination Note  05/15/2022 Name: CHRISTINE MORTON MRN: [id number] DOB: 09/09/1971  Aldona Lento is a 51 y.o. year old female who is a primary care patient of Debbrah Alar, NP and is actively engaged with the care management team. I reached out to Aldona Lento by phone today to assist with re-scheduling a follow up visit with the RN Case Manager  Follow up plan: 2nd Unsuccessful telephone outreach attempt made. A HIPAA compliant phone message was left for the patient providing contact information and requesting a return call.   Julian Hy, [place] Management  Direct Dial: [phone]

## 2022-05-25 NOTE — Chronic Care Management (AMB) (Signed)
  Care Coordination Note  05/25/2022 Name: [name] MRN: [id number] DOB: 1971/09/09  Yvonne [name] is a 51 y.o. year old female who is a primary care patient of Debbrah Alar, NP and is actively engaged with the care management team. I reached out to Yvonne [name] by phone today to assist with re-scheduling a follow up visit with the RN Case Manager  Follow up plan: Patient declines further follow up and engagement by the care management team. Appropriate care team members and provider have been notified via electronic communication.   Julian Hy, [place] Management  Direct Dial: [phone]

## 2022-06-01 ENCOUNTER — Ambulatory Visit: Payer: Self-pay

## 2022-06-02 IMAGING — DX DG ABD PORTABLE 1V
2 series · 2 of 2 positions shown · non-contrast
Comparison: December 05, 2021.

CLINICAL DATA: Status post exploratory laparotomy.

EXAM:
PORTABLE ABDOMEN - 1 VIEW

[abdomen kub (1 of 2)]
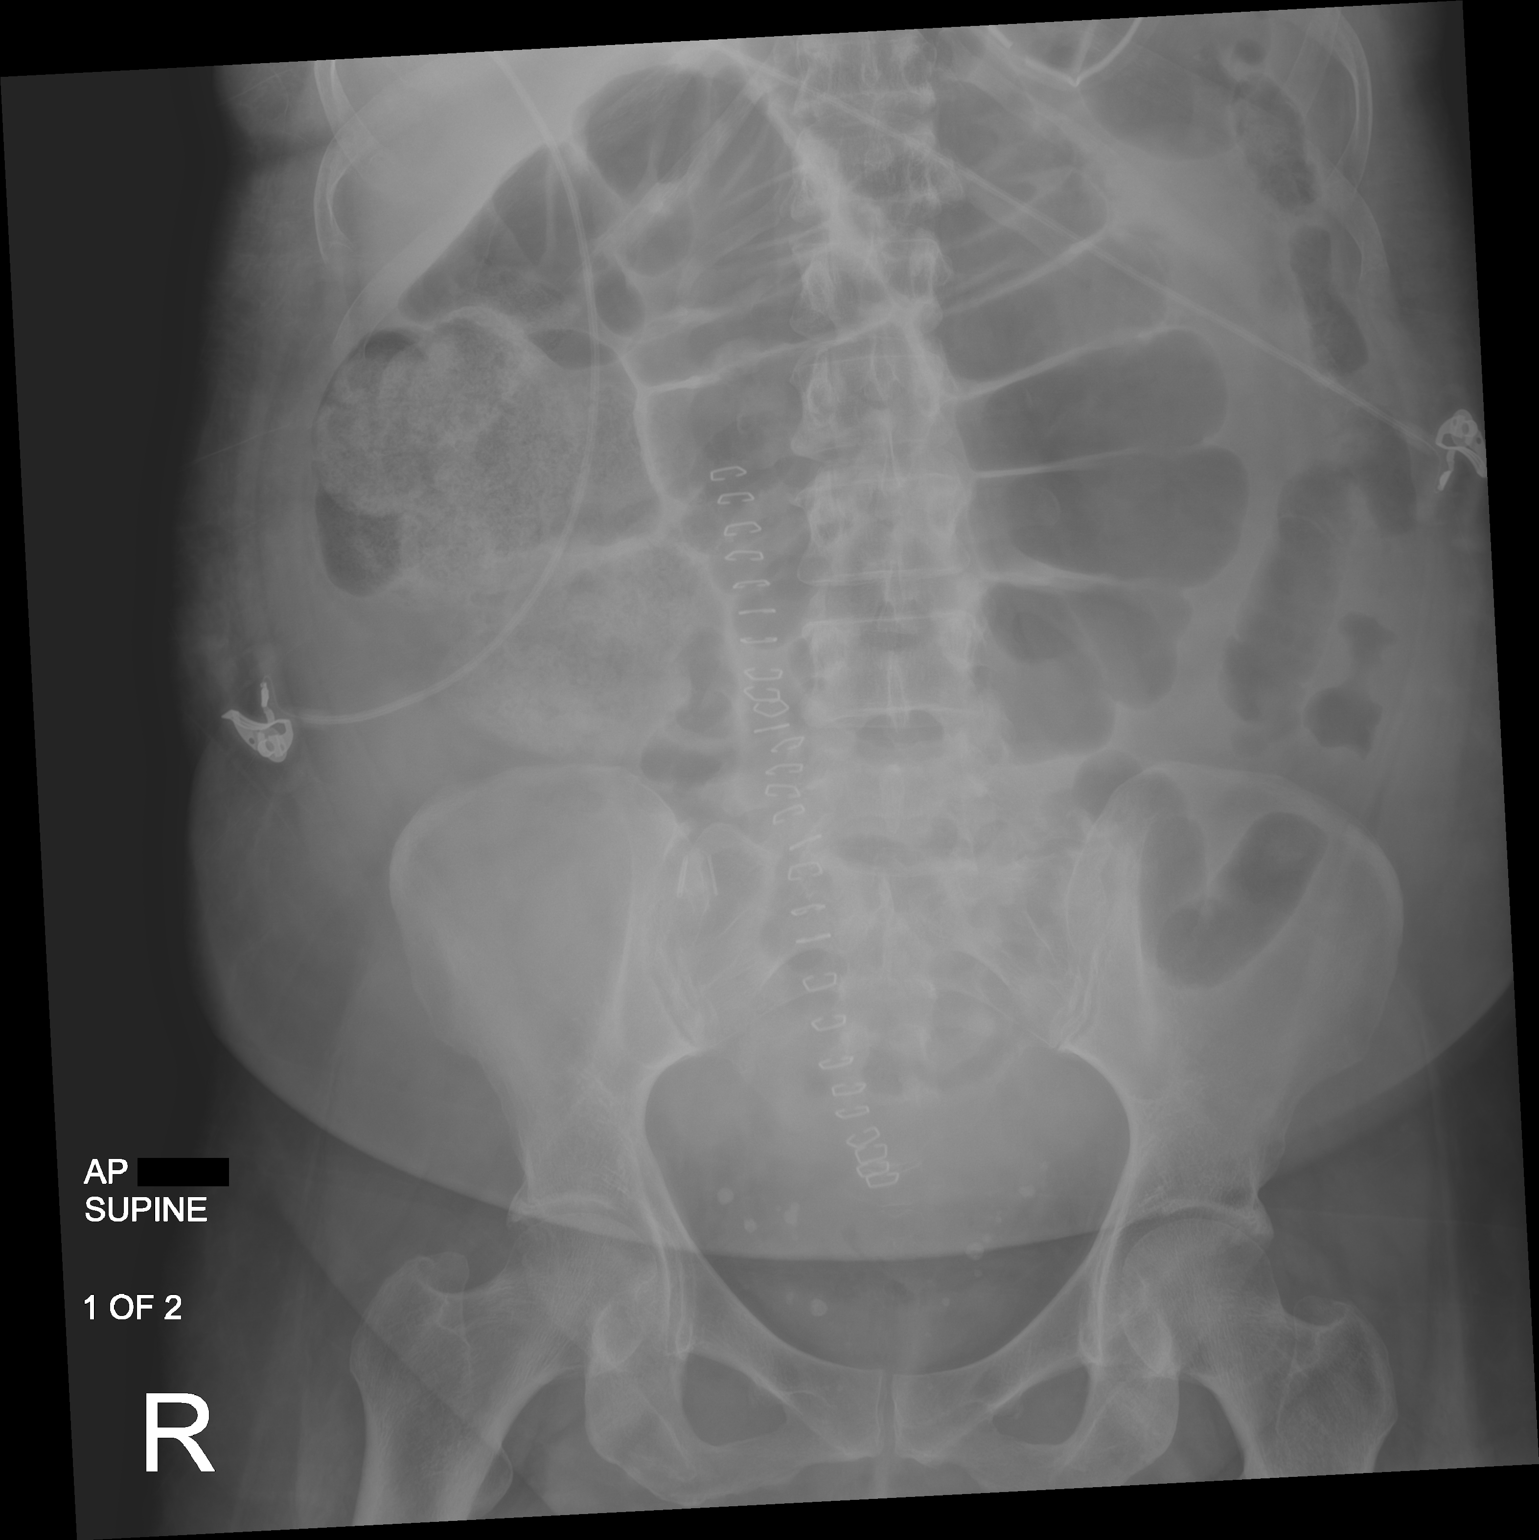

[abdomen kub (2 of 2)]
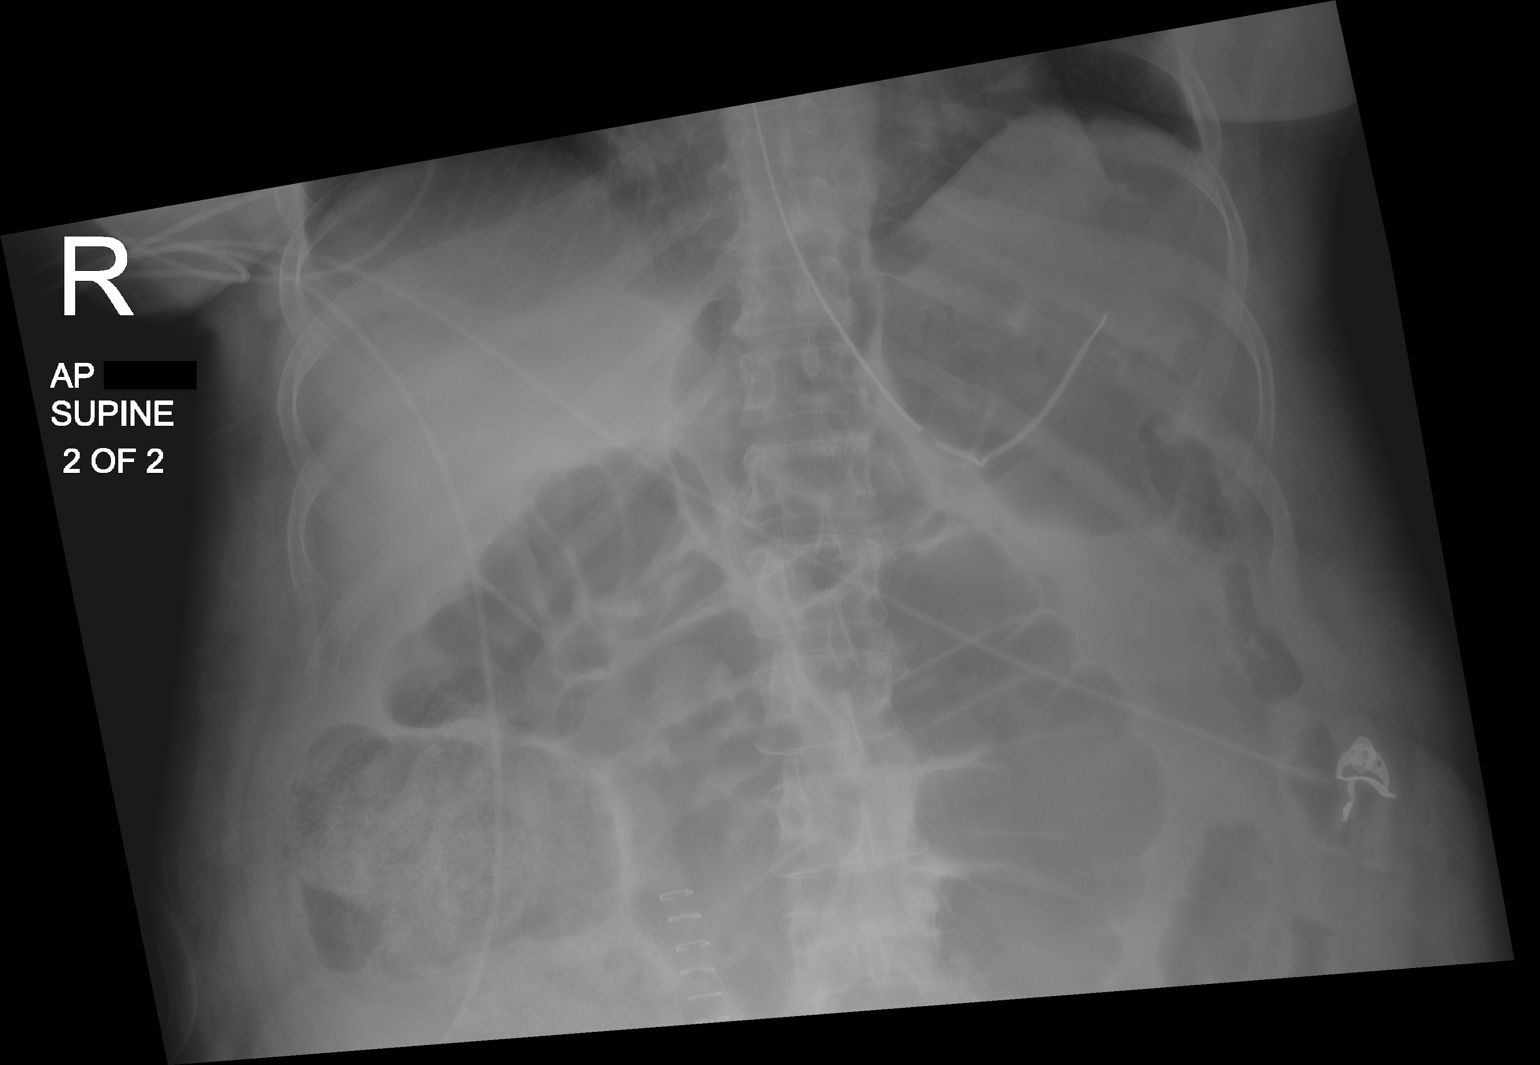

[2 of 2 positions shown; findings below may reference images not displayed]

FINDINGS: Midline surgical staples are noted. Phleboliths are noted in the
pelvis. Small bowel dilatation noted on prior exam is significantly
improved. Moderate amount of stool seen in right colon. Mild dilated
transverse colon is noted most likely representing ileus.
IMPRESSION: Improved small bowel dilatation compared to prior exam. Continued
colonic dilatation is noted concerning for ileus.

## 2022-06-08 ENCOUNTER — Ambulatory Visit (INDEPENDENT_AMBULATORY_CARE_PROVIDER_SITE_OTHER): Payer: 59 | Admitting: Family Medicine

## 2022-06-10 ENCOUNTER — Ambulatory Visit (INDEPENDENT_AMBULATORY_CARE_PROVIDER_SITE_OTHER): Payer: 59 | Admitting: Family Medicine

## 2022-06-11 ENCOUNTER — Ambulatory Visit (INDEPENDENT_AMBULATORY_CARE_PROVIDER_SITE_OTHER): Payer: 59 | Admitting: Family Medicine

## 2022-06-11 ENCOUNTER — Encounter (INDEPENDENT_AMBULATORY_CARE_PROVIDER_SITE_OTHER): Payer: Self-pay | Admitting: Family Medicine

## 2022-06-11 ENCOUNTER — Other Ambulatory Visit (HOSPITAL_COMMUNITY): Payer: Self-pay

## 2022-06-11 VITALS — BP 135/88 | HR 103 | Temp 97.9°F | Ht 65.0 in | Wt 195.0 lb

## 2022-06-11 DIAGNOSIS — Z7985 Long-term (current) use of injectable non-insulin antidiabetic drugs: Secondary | ICD-10-CM

## 2022-06-11 DIAGNOSIS — E669 Obesity, unspecified: Secondary | ICD-10-CM | POA: Diagnosis not present

## 2022-06-11 DIAGNOSIS — E8881 Metabolic syndrome: Secondary | ICD-10-CM | POA: Diagnosis not present

## 2022-06-11 DIAGNOSIS — E7800 Pure hypercholesterolemia, unspecified: Secondary | ICD-10-CM

## 2022-06-11 DIAGNOSIS — E559 Vitamin D deficiency, unspecified: Secondary | ICD-10-CM | POA: Diagnosis not present

## 2022-06-11 DIAGNOSIS — Z6832 Body mass index (BMI) 32.0-32.9, adult: Secondary | ICD-10-CM

## 2022-06-11 MED ORDER — SEMAGLUTIDE-WEIGHT MANAGEMENT 1.7 MG/0.75ML ~~LOC~~ SOAJ
1.7000 mg | SUBCUTANEOUS | 0 refills | Status: DC
  Filled 2022-06-11: qty 3, 28d supply, fill #0

## 2022-06-12 LAB — CMP14+EGFR
ALT: 15 IU/L (ref 0–32)
AST: 11 IU/L (ref 0–40)
Albumin/Globulin Ratio: 1.5 (ref 1.2–2.2)
Albumin: 4.3 g/dL (ref 3.8–4.8)
Alkaline Phosphatase: 152 IU/L — ABNORMAL HIGH (ref 44–121)
BUN/Creatinine Ratio: 20 (ref 9–23)
BUN: 17 mg/dL (ref 6–24)
Bilirubin Total: 0.3 mg/dL (ref 0.0–1.2)
CO2: 22 mmol/L (ref 20–29)
Calcium: 10.7 mg/dL — ABNORMAL HIGH (ref 8.7–10.2)
Chloride: 106 mmol/L (ref 96–106)
Creatinine, Ser: 0.87 mg/dL (ref 0.57–1.00)
Globulin, Total: 2.8 g/dL (ref 1.5–4.5)
Glucose: 87 mg/dL (ref 70–99)
Potassium: 4.5 mmol/L (ref 3.5–5.2)
Sodium: 143 mmol/L (ref 134–144)
Total Protein: 7.1 g/dL (ref 6.0–8.5)
eGFR: 82 mL/min/{1.73_m2} (ref >59)

## 2022-06-12 LAB — LIPID PANEL WITH LDL/HDL RATIO
Cholesterol, Total: 223 mg/dL — ABNORMAL HIGH (ref 100–199)
HDL: 82 mg/dL (ref >39)
LDL Chol Calc (NIH): 130 mg/dL — ABNORMAL HIGH (ref 0–99)
LDL/HDL Ratio: 1.6 ratio (ref 0.0–3.2)
Triglycerides: 66 mg/dL (ref 0–149)
VLDL Cholesterol Cal: 11 mg/dL (ref 5–40)

## 2022-06-12 LAB — TSH: TSH: 1.07 u[IU]/mL (ref 0.450–4.500)

## 2022-06-12 LAB — VITAMIN B12: Vitamin B-12: 313 pg/mL (ref 232–1245)

## 2022-06-12 LAB — HEMOGLOBIN A1C
Est. average glucose Bld gHb Est-mCnc: 120 mg/dL
Hgb A1c MFr Bld: 5.8 % — ABNORMAL HIGH (ref 4.8–5.6)

## 2022-06-12 LAB — INSULIN, RANDOM: INSULIN: 48.7 u[IU]/mL — ABNORMAL HIGH (ref 2.6–24.9)

## 2022-06-12 LAB — VITAMIN D 25 HYDROXY (VIT D DEFICIENCY, FRACTURES): Vit D, 25-Hydroxy: 33.6 ng/mL (ref 30.0–100.0)

## 2022-06-15 NOTE — Progress Notes (Signed)
Chief Complaint:   OBESITY Yvonne [name] is here to discuss her progress with her obesity treatment plan along with follow-up of her obesity related diagnoses. [name] is on keeping a food journal and adhering to recommended goals of 1300-1500 calories and 85 grams of protein daily and states she is following her eating plan approximately 50% of the time. [name] states she is walking for 30 minutes 7 times per week.  Today's visit was #: 51 Starting weight: 204 lbs Starting date: 05/17/2017 Today's weight: 195 lbs Today's date: 06/11/2022 Total lbs lost to date: 9 Total lbs lost since last in-office visit: 0  Interim History: [name] struggled to log on My[employer]itness Pal, caused anxiety, she restarted her low carbohydrates diet once her family left. She has adequate satiety for 4 days on Wegovy 1.7 mg without GI upset. She skips lunch. She denies cravings. She drinks (sweet) raspberry tea daily. She walks the dog for 30 minutes each night.   Subjective:   1. Insulin resistance [name]'s last insulin level on 11/26/2021 was high at 55.8.  She is still struggling with intake of sweet tea daily.  2. Pure hypercholesterolemia [name]'s last LDL was 122 on 11/26/2021.  3. Vitamin D deficiency [name]'s last vitamin D level was 56 on 11/26/2021.  She is on OTC vitamin D.  Assessment/Plan:   1. Insulin resistance We will check labs today.  [name] is to opt for water or unsweetened tea.  She will continue walking daily for exercise.  - CMP14+EGFR - Vitamin B12 - Hemoglobin A1c - Insulin, random - TSH  2. Pure hypercholesterolemia We will check labs today.  Cardiovascular risk and specific lipid/LDL goals reviewed.  We discussed several lifestyle modifications today. [name] will continue to work on diet, exercise and weight loss efforts. Orders and follow up as documented in patient record.   - Lipid Panel With LDL/HDL Ratio  3. Vitamin D deficiency We will check labs today, and we will  follow-up at [facility] next visit.  - VITAMIN D 25 Hydroxy (Vit-D Deficiency, Fractures)  4. Obesity, Current BMI 32.5 [name] is currently in the action stage of change. As such, her goal is to continue with weight loss efforts. She has agreed to following a lower carbohydrate, vegetable and lean protein rich diet plan.   Continue her meal plan. Eat lunch daily. Update labs today.  We discussed various medication options to help [facility] with her weight loss efforts and we both agreed to continue Wegovy at 1.7 mg, and we will refill for 1 month.  - Semaglutide-Weight Management 1.7 MG/0.75ML SOAJ; Inject 1.7 mg into the skin once a week.  Dispense: 3 mL; Refill: 0  Exercise goals: As is.   Behavioral modification strategies: increasing lean protein intake, no skipping meals, keeping healthy foods in the home, and planning for success.  [name] has agreed to follow-up with our clinic in 4 weeks. She was informed of the importance of frequent follow-up visits to maximize her success with intensive lifestyle modifications for her multiple health conditions.   [name] was informed we would discuss her lab results at her next visit unless there is a critical issue that needs to be addressed sooner. [name] agreed to keep her next visit at the agreed upon time to discuss these results.  Objective:   Blood pressure 135/88, pulse (!) 103, temperature 97.9 F (36.6 C), height _0  (1.651 m), weight 195 lb (88.5 kg), SpO2 99 %. Body mass index is 32.45 kg/m.  General: Cooperative, alert,  well developed, in no acute distress. HEENT: Conjunctivae and lids unremarkable. Cardiovascular: Regular rhythm.  Lungs: Normal work of breathing. Neurologic: No focal deficits.   Lab Results  Component Value Date   CREATININE 0.87 06/11/2022   BUN 17 06/11/2022   NA 143 06/11/2022   K 4.5 06/11/2022   CL 106 06/11/2022   CO2 22 06/11/2022   Lab Results  Component Value Date   ALT 15 06/11/2022   AST  11 06/11/2022   ALKPHOS 152 (H) 06/11/2022   BILITOT 0.3 06/11/2022   Lab Results  Component Value Date   HGBA1C 5.8 (H) 06/11/2022   HGBA1C 5.7 (H) 11/26/2021   HGBA1C 5.8 (H) 10/07/2021   HGBA1C 6.1 (H) 01/08/2021   HGBA1C 5.9 (H) 11/09/2019   Lab Results  Component Value Date   INSULIN 48.7 (H) 06/11/2022   INSULIN 55.8 (H) 11/26/2021   INSULIN 49.9 (H) 10/07/2021   INSULIN 37.3 (H) 01/08/2021   INSULIN 33.2 (H) 11/09/2019   Lab Results  Component Value Date   TSH 1.070 06/11/2022   Lab Results  Component Value Date   CHOL 223 (H) 06/11/2022   HDL 82 06/11/2022   LDLCALC 130 (H) 06/11/2022   TRIG 66 06/11/2022   CHOLHDL 3.4 04/28/2019   Lab Results  Component Value Date   VD25OH 33.6 06/11/2022   VD25OH 56.0 11/26/2021   VD25OH 71.9 10/07/2021   Lab Results  Component Value Date   WBC 8.8 01/12/2022   HGB 12.1 01/12/2022   HCT 36.6 01/12/2022   MCV 94.6 01/12/2022   PLT 338.0 01/12/2022   Lab Results  Component Value Date   IRON 77 01/12/2022   FERRITIN 131.2 01/12/2022   Attestation Statements:   Reviewed by clinician on day of visit: allergies, medications, problem list, medical history, surgical history, family history, social history, and previous encounter notes.   I, Trixie Dredge, am acting as transcriptionist for Dennard Nip, MD.  I have reviewed the above documentation for accuracy and completeness, and I agree with the above. -  Dennard Nip, MD

## 2022-07-07 ENCOUNTER — Ambulatory Visit (INDEPENDENT_AMBULATORY_CARE_PROVIDER_SITE_OTHER): Payer: 59 | Admitting: Family Medicine

## 2022-07-07 ENCOUNTER — Other Ambulatory Visit (HOSPITAL_COMMUNITY): Payer: Self-pay

## 2022-07-07 ENCOUNTER — Encounter (INDEPENDENT_AMBULATORY_CARE_PROVIDER_SITE_OTHER): Payer: Self-pay | Admitting: Family Medicine

## 2022-07-07 VITALS — BP 127/90 | HR 90 | Temp 97.7°F | Ht 65.0 in | Wt 199.0 lb

## 2022-07-07 DIAGNOSIS — F3289 Other specified depressive episodes: Secondary | ICD-10-CM | POA: Diagnosis not present

## 2022-07-07 DIAGNOSIS — Z6833 Body mass index (BMI) 33.0-33.9, adult: Secondary | ICD-10-CM | POA: Diagnosis not present

## 2022-07-07 DIAGNOSIS — E669 Obesity, unspecified: Secondary | ICD-10-CM | POA: Diagnosis not present

## 2022-07-07 MED ORDER — SEMAGLUTIDE-WEIGHT MANAGEMENT 1.7 MG/0.75ML ~~LOC~~ SOAJ
1.7000 mg | SUBCUTANEOUS | 0 refills | Status: DC
  Filled 2022-07-07: qty 3, 28d supply, fill #0

## 2022-07-07 MED ORDER — ESCITALOPRAM OXALATE 10 MG PO TABS
10.0000 mg | ORAL_TABLET | Freq: Every day | ORAL | 0 refills | Status: DC
  Filled 2022-07-07: qty 30, 30d supply, fill #0

## 2022-07-15 ENCOUNTER — Encounter (INDEPENDENT_AMBULATORY_CARE_PROVIDER_SITE_OTHER): Payer: Self-pay

## 2022-07-17 ENCOUNTER — Ambulatory Visit (INDEPENDENT_AMBULATORY_CARE_PROVIDER_SITE_OTHER): Payer: 59 | Admitting: Family

## 2022-07-17 DIAGNOSIS — Z9189 Other specified personal risk factors, not elsewhere classified: Secondary | ICD-10-CM

## 2022-07-17 DIAGNOSIS — I10 Essential (primary) hypertension: Secondary | ICD-10-CM

## 2022-07-17 NOTE — Progress Notes (Signed)
Subjective:   By signing my name below, I, Cassell Clement, attest that this documentation has been prepared under the direction and in the presence of Alma Downs' [place], NP 07/17/2022   Patient ID: Yvonne [name], female    DOB: [birth year], 51 y.o.   MRN: [id number]  Chief Complaint  Patient presents with   Hypertension    Here for follow up    HPI Patient is in today for an office visit  Blood Pressure: As of today's visit, her blood pressure is decreasing. She is currently taking 25 Mg of Losartan.  BP Readings from Last 3 Encounters:  07/17/22 99/74  07/07/22 (!) 127/90  06/11/22 135/88   Pulse Readings from Last 3 Encounters:  07/17/22 (!) 105  07/07/22 90  06/11/22 (!) 103   Blood Sugar: Her A1C levels are slightly elevating Lab Results  Component Value Date   HGBA1C 5.8 (H) 06/11/2022   Cholesterol: She states that Dr. Dalbert Garnet is requesting for the patient to get her cholesterol level checked.   Lab Results  Component Value Date   CHOL 223 (H) 06/11/2022   HDL 82 06/11/2022   LDLCALC 130 (H) 06/11/2022   TRIG 66 06/11/2022   CHOLHDL 3.4 04/28/2019   Vitamin D: Her vitamin D levels are normal Calcium: Her calcium levels are slightly elevating.  Menopause: She reports that she is currently experiencing menopause Mammogram: Last completed on 01/01/2022 Colonoscopy: Last competed on 01/21/2018 Pap Smear: Last completed on 01/01/2022  Health Maintenance Due  Topic Date Due   Hepatitis C Screening  Never done   COVID-19 Vaccine (4 - Pfizer risk series) 02/14/2021   INFLUENZA VACCINE  07/07/2022    Past Medical History:  Diagnosis Date   Anemia    Arthritis    High blood pressure    Joint pain    Kidney stones    Microscopic hematuria    Ovarian torsion 2022   Vitamin D deficiency     Past Surgical History:  Procedure Laterality Date   LAPAROTOMY N/A 12/02/2021   Procedure: EXPLORATORY LAPAROTOMY,REMOVAL OF RIGHT TUBE AND OVARY;  Surgeon:  Carver Fila, MD;  Location: [facility];  Service: Gynecology;  Laterality: N/A;   NO PAST SURGERIES     Denies surgical history    Family History  Problem Relation Age of Onset   Other Mother        deceased from multiple myelomas   Cancer Mother    Healthy Father    Congestive Heart Failure Father    Heart disease Father        CABG   Diabetes Father    Coronary artery disease Father    Cancer Maternal Grandmother    Multiple myeloma Maternal Grandmother    Cancer Maternal Grandfather        unsure what type of cancer   Diabetes Paternal Grandmother    Prostate cancer Paternal Grandmother    Colon cancer Neg Hx    Esophageal cancer Neg Hx    Liver cancer Neg Hx    Pancreatic cancer Neg Hx    Rectal cancer Neg Hx    Stomach cancer Neg Hx     Social History   Socioeconomic History   Marital status: Married    Spouse name: Not on file   Number of children: 3   Years of education: Not on file   Highest education level: Not on file  Occupational History   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    Employer: [employer]  Tobacco Use   Smoking status: Never   Smokeless tobacco: Never  Vaping Use   Vaping Use: Never used  Substance and Sexual Activity   Alcohol use: No   Drug use: No   Sexual activity: Yes    Partners: Male    Birth control/protection: Pill  Other Topics Concern   Not on file  Social History Narrative   5 children (3 are out of the house) 3 biiological and 2 step   Married to [employer]   Works in patient Access at [employer]   12/16/21 lives with husband one daughter at home who is 7 yr            Social Determinants of Health   Financial Resource Strain: [place]  (01/27/2022)   Overall Financial Resource Strain (CARDIA)    Difficulty of Paying Living Expenses: Not hard at all  Food Insecurity: No Food Insecurity (01/27/2022)   Hunger Vital Sign    Worried About Running Out of Food in the Last Year: Never true    Bud in  the Last Year: Never true  Transportation Needs: No Transportation Needs (01/27/2022)   PRAPARE - Hydrologist (Medical): No    Lack of Transportation (Non-Medical): No  Physical Activity: Sufficiently Active (01/27/2022)   Exercise Vital Sign    Days of Exercise per Week: 5 days    Minutes of Exercise per Session: 30 min  Stress: Not on file  Social Connections: Not on file  Intimate Partner Violence: Not on file    Outpatient Medications Prior to Visit  Medication Sig Dispense Refill   escitalopram (LEXAPRO) 10 MG tablet Take 1 tablet (10 mg total) by mouth daily. 30 tablet 0   losartan (COZAAR) 25 MG tablet Take 1 tablet (25 mg total) by mouth daily. ( Stop lisinopril) 90 tablet 3   norethindrone (MICRONOR) 0.35 MG tablet Take 1 tablet by mouth daily. 84 tablet 4   Semaglutide-Weight Management 1.7 MG/0.75ML SOAJ Inject 1.7 mg into the skin once a week. 3 mL 0   No facility-administered medications prior to visit.    Allergies  Allergen Reactions   Red Blood Cells     Jehovah's Witness, does not want to receive blood    ROS     Objective:    Physical Exam Constitutional:      General: She is not in acute distress.    Appearance: Normal appearance. She is not ill-appearing.  HENT:     Head: Normocephalic and atraumatic.     Right Ear: External ear normal.     Left Ear: External ear normal.  Eyes:     Extraocular Movements: Extraocular movements intact.     Pupils: Pupils are equal, round, and reactive to light.  Cardiovascular:     Rate and Rhythm: Normal rate and regular rhythm.     Heart sounds: Normal heart sounds. No murmur heard.    No gallop.  Pulmonary:     Effort: Pulmonary effort is normal. No respiratory distress.     Breath sounds: Normal breath sounds. No wheezing or rales.  Skin:    General: Skin is warm and dry.  Neurological:     Mental Status: She is alert and oriented to person, place, and time.  Psychiatric:         Mood and Affect: Mood normal.        Behavior: Behavior normal.  Judgment: Judgment normal.     BP 99/74 (BP Location: Right Arm, Patient Position: Sitting, Cuff Size: Large)   Pulse (!) 105   Temp 98 F (36.7 C) (Oral)   Resp 16   Wt 196 lb (88.9 kg)   SpO2 99%   BMI 32.62 kg/m  Wt Readings from Last 3 Encounters:  07/17/22 196 lb (88.9 kg)  07/07/22 199 lb (90.3 kg)  06/11/22 195 lb (88.5 kg)       Assessment & Plan:   Problem List Items Addressed This Visit       Unprioritized   Hypertension    BP Readings from Last 3 Encounters:  07/17/22 99/74  07/07/22 (!) 127/90  06/11/22 135/88  BP is a little low today, but was higher the last 2 times it was checked.  Denies dizziness. Will continue current dose of losarta.         Hypercalcemia    Vit D supplement was discontinued a few months back and calcium seems to be trending downward.  PTH was normal. Plan to recheck Ca in 3 months.       At risk for diabetes mellitus    Lab Results  Component Value Date   HGBA1C 5.8 (H) 06/11/2022  Continues to work with healthy weight and wellness.   Wt Readings from Last 3 Encounters:  07/17/22 196 lb (88.9 kg)  07/07/22 199 lb (90.3 kg)  06/11/22 195 lb (88.5 kg)        No orders of the defined types were placed in this encounter.   I, Nance Pear, NP, personally preformed the services described in this documentation.  All medical record entries made by the scribe were at my direction and in my presence.  I have reviewed the chart and discharge instructions (if applicable) and agree that the record reflects my personal performance and is accurate and complete. 07/17/2022   I,Amber Collins,acting as a Education administrator for Nance Pear, NP.,have documented all relevant documentation on the behalf of Nance Pear, NP,as directed by  Nance Pear, NP while in the presence of Nance Pear, NP.    Nance Pear, NP

## 2022-07-17 NOTE — Assessment & Plan Note (Addendum)
BP Readings from Last 3 Encounters:  07/17/22 99/74  07/07/22 (!) 127/90  06/11/22 135/88   BP is a little low today, but was higher the last 2 times it was checked.  Denies dizziness. Will continue current dose of losartan.

## 2022-07-17 NOTE — Assessment & Plan Note (Signed)
Lab Results  Component Value Date   HGBA1C 5.8 (H) 06/11/2022   Continues to work with healthy weight and wellness.   Wt Readings from Last 3 Encounters:  07/17/22 196 lb (88.9 kg)  07/07/22 199 lb (90.3 kg)  06/11/22 195 lb (88.5 kg)

## 2022-07-17 NOTE — Assessment & Plan Note (Signed)
Vit D supplement was discontinued a few months back and calcium seems to be trending downward.  PTH was normal. Plan to recheck Ca in 3 months.

## 2022-07-19 NOTE — Progress Notes (Unsigned)
Chief Complaint:   OBESITY Yvonne [name] is here to discuss her progress with her obesity treatment plan along with follow-up of her obesity related diagnoses. Adaria is on following a lower carbohydrate, vegetable and lean protein rich diet plan and states she is following her eating plan approximately 550-60% of the time. Suheily states she is walking for 30 minutes 7 times per week.  Today's visit was #: 77 Starting weight: 204 lbs Starting date: 05/17/2017 Today's weight: 199 lbs Today's date: 07/07/2022 Total lbs lost to date: 5 Total lbs lost since last in-office visit: 0  Interim History: Imberly continues to struggle with increased emotional eating behaviors.  She is stable on Wegovy and she notes her hunger is controlled.  Subjective:   1. Other depression, emotional eating binges Leta notes increased emotional eating behaviors and some binge eating has returned.  She has had side effects with Wellbutrin and Topamax, but she did well on Lexapro in the past.  Assessment/Plan:   1. Other depression, emotional eating binges Shauntel agreed to restart Lexapro 10 mg once daily, and we will refill for 1 month.  - escitalopram (LEXAPRO) 10 MG tablet; Take 1 tablet (10 mg total) by mouth daily.  Dispense: 30 tablet; Refill: 0  2. Obesity, Current BMI 33.1 Lusia is currently in the action stage of change. As such, her goal is to continue with weight loss efforts. She has agreed to keeping a food journal and adhering to recommended goals of 1300-1500 calories and 85+ grams of protein daily.   We discussed various medication options to help [facility] with her weight loss efforts and we both agreed to continue Wegovy 1.7 mg once weekly, and we will refill for 1 month.  - Semaglutide-Weight Management 1.7 MG/0.75ML SOAJ; Inject 1.7 mg into the skin once a week.  Dispense: 3 mL; Refill: 0  Exercise goals: As is.   Behavioral modification strategies: increasing lean protein intake.  Charlii  has agreed to follow-up with our clinic in 3 to 4 weeks. She was informed of the importance of frequent follow-up visits to maximize her success with intensive lifestyle modifications for her multiple health conditions.   Objective:   Blood pressure (!) 127/90, pulse 90, temperature 97.7 F (36.5 C), height '5\' 5"'$  (1.651 m), weight 199 lb (90.3 kg), SpO2 98 %. Body mass index is 33.12 kg/m.  General: Cooperative, alert, well developed, in no acute distress. HEENT: Conjunctivae and lids unremarkable. Cardiovascular: Regular rhythm.  Lungs: Normal work of breathing. Neurologic: No focal deficits.   Lab Results  Component Value Date   CREATININE 0.87 06/11/2022   BUN 17 06/11/2022   NA 143 06/11/2022   K 4.5 06/11/2022   CL 106 06/11/2022   CO2 22 06/11/2022   Lab Results  Component Value Date   ALT 15 06/11/2022   AST 11 06/11/2022   ALKPHOS 152 (H) 06/11/2022   BILITOT 0.3 06/11/2022   Lab Results  Component Value Date   HGBA1C 5.8 (H) 06/11/2022   HGBA1C 5.7 (H) 11/26/2021   HGBA1C 5.8 (H) 10/07/2021   HGBA1C 6.1 (H) 01/08/2021   HGBA1C 5.9 (H) 11/09/2019   Lab Results  Component Value Date   INSULIN 48.7 (H) 06/11/2022   INSULIN 55.8 (H) 11/26/2021   INSULIN 49.9 (H) 10/07/2021   INSULIN 37.3 (H) 01/08/2021   INSULIN 33.2 (H) 11/09/2019   Lab Results  Component Value Date   TSH 1.070 06/11/2022   Lab Results  Component Value Date   CHOL  223 (H) 06/11/2022   HDL 82 06/11/2022   LDLCALC 130 (H) 06/11/2022   TRIG 66 06/11/2022   CHOLHDL 3.4 04/28/2019   Lab Results  Component Value Date   VD25OH 33.6 06/11/2022   VD25OH 56.0 11/26/2021   VD25OH 71.9 10/07/2021   Lab Results  Component Value Date   WBC 8.8 01/12/2022   HGB 12.1 01/12/2022   HCT 36.6 01/12/2022   MCV 94.6 01/12/2022   PLT 338.0 01/12/2022   Lab Results  Component Value Date   IRON 77 01/12/2022   FERRITIN 131.2 01/12/2022   Attestation Statements:   Reviewed by clinician on  day of visit: allergies, medications, problem list, medical history, surgical history, family history, social history, and previous encounter notes.   I, Trixie Dredge, am acting as transcriptionist for Dennard Nip, MD.  I have reviewed the above documentation for accuracy and completeness, and I agree with the above. -  Dennard Nip, MD

## 2022-07-31 ENCOUNTER — Other Ambulatory Visit (HOSPITAL_COMMUNITY): Payer: Self-pay

## 2022-08-11 ENCOUNTER — Other Ambulatory Visit (HOSPITAL_COMMUNITY): Payer: Self-pay

## 2022-08-11 ENCOUNTER — Ambulatory Visit (INDEPENDENT_AMBULATORY_CARE_PROVIDER_SITE_OTHER): Payer: 59 | Admitting: Family Medicine

## 2022-08-11 ENCOUNTER — Encounter (INDEPENDENT_AMBULATORY_CARE_PROVIDER_SITE_OTHER): Payer: Self-pay | Admitting: Family Medicine

## 2022-08-11 VITALS — BP 113/82 | HR 99 | Temp 97.7°F | Ht 65.0 in | Wt 190.0 lb

## 2022-08-11 DIAGNOSIS — Z6831 Body mass index (BMI) 31.0-31.9, adult: Secondary | ICD-10-CM

## 2022-08-11 DIAGNOSIS — E669 Obesity, unspecified: Secondary | ICD-10-CM | POA: Diagnosis not present

## 2022-08-11 DIAGNOSIS — F3289 Other specified depressive episodes: Secondary | ICD-10-CM

## 2022-08-11 DIAGNOSIS — Z7985 Long-term (current) use of injectable non-insulin antidiabetic drugs: Secondary | ICD-10-CM | POA: Diagnosis not present

## 2022-08-11 DIAGNOSIS — E1169 Type 2 diabetes mellitus with other specified complication: Secondary | ICD-10-CM | POA: Diagnosis not present

## 2022-08-11 DIAGNOSIS — E119 Type 2 diabetes mellitus without complications: Secondary | ICD-10-CM | POA: Insufficient documentation

## 2022-08-11 DIAGNOSIS — R7303 Prediabetes: Secondary | ICD-10-CM | POA: Insufficient documentation

## 2022-08-11 MED ORDER — ESCITALOPRAM OXALATE 10 MG PO TABS
10.0000 mg | ORAL_TABLET | Freq: Every day | ORAL | 0 refills | Status: DC
  Filled 2022-08-11: qty 30, 30d supply, fill #0

## 2022-08-11 MED ORDER — SEMAGLUTIDE-WEIGHT MANAGEMENT 1.7 MG/0.75ML ~~LOC~~ SOAJ
1.7000 mg | SUBCUTANEOUS | 0 refills | Status: DC
  Filled 2022-08-11: qty 3, 28d supply, fill #0

## 2022-08-18 NOTE — Progress Notes (Signed)
Chief Complaint:   OBESITY Yvonne [name] is here to discuss her progress with her obesity treatment plan along with follow-up of her obesity related diagnoses. Yvonne [name] is on keeping a food journal and adhering to recommended goals of 1300-1500 calories and 85+ grams of protein daily and states she is following her eating plan approximately 85% of the time. Yvonne [name] states she is walking for 30 minutes 7 times per week.  Today's visit was #: 3 Starting weight: 204 lbs Starting date: 05/17/2017 Today's weight: 190 lbs Today's date: 08/11/2022 Total lbs lost to date: 14 Total lbs lost since last in-office visit: 9  Interim History: Yvonne [name] continues to do well with weight loss on Wegovy and with changing to a low carbohydrates plan. She has questions about fruit.   Subjective:   1. Type 2 diabetes mellitus with other specified complication, without long-term current use of insulin (HCC) Yvonne [name] is doing well with her diet. She has no signs of hypoglycemia. She is on a GLP-1 to help with weight loss as well as glucose control  2. Other depression, emotional eating binges Yvonne [name] started Lexapro to help with decreasing emotional eating behaviors, and she feels it has helped. She doesn't note side effects and although she has cravings they are not as strong.   Assessment/Plan:   1. Type 2 diabetes mellitus with other specified complication, without long-term current use of insulin ([place]) Yvonne [name] will continue her diet and GLP-1 and we will continue to follow.   2. Other depression, emotional eating binges Yvonne [name] will continue Lexapro 10 once daily, and we will refill for 1 month.  - escitalopram (LEXAPRO) 10 MG tablet; Take 1 tablet (10 mg total) by mouth daily.  Dispense: 30 tablet; Refill: 0  3. Obesity, Current BMI 31.7 Yvonne [name] is currently in the action stage of change. As such, her goal is to continue with weight loss efforts. She has agreed to following a lower carbohydrate, vegetable and  lean protein rich diet plan.   Low sugar fruit handout was given.  We discussed various medication options to help Yvonne [name] with her weight loss efforts and we both agreed to continue Wegovy 1.7 mg once weekly, and we will refill for 1 month.  - Semaglutide-Weight Management 1.7 MG/0.75ML SOAJ; Inject 1.7 mg into the skin once a week.  Dispense: 3 mL; Refill: 0  Exercise goals: As is.   Behavioral modification strategies: increasing lean protein intake and increasing high fiber foods.  Yvonne [name] has agreed to follow-up with our clinic in 4 weeks. She was informed of the importance of frequent follow-up visits to maximize her success with intensive lifestyle modifications for her multiple health conditions.   Objective:   Blood pressure 113/82, pulse 99, temperature 97.7 F (36.5 C), height '5\' 5"'$  (1.651 m), weight 190 lb (86.2 kg), SpO2 96 %. Body mass index is 31.62 kg/m.  General: Cooperative, alert, well developed, in no acute distress. HEENT: Conjunctivae and lids unremarkable. Cardiovascular: Regular rhythm.  Lungs: Normal work of breathing. Neurologic: No focal deficits.   Lab Results  Component Value Date   CREATININE 0.87 06/11/2022   BUN 17 06/11/2022   NA 143 06/11/2022   K 4.5 06/11/2022   CL 106 06/11/2022   CO2 22 06/11/2022   Lab Results  Component Value Date   ALT 15 06/11/2022   AST 11 06/11/2022   ALKPHOS 152 (H) 06/11/2022   BILITOT 0.3 06/11/2022   Lab Results  Component Value Date   HGBA1C 5.8 (H) 06/11/2022  HGBA1C 5.7 (H) 11/26/2021   HGBA1C 5.8 (H) 10/07/2021   HGBA1C 6.1 (H) 01/08/2021   HGBA1C 5.9 (H) 11/09/2019   Lab Results  Component Value Date   INSULIN 48.7 (H) 06/11/2022   INSULIN 55.8 (H) 11/26/2021   INSULIN 49.9 (H) 10/07/2021   INSULIN 37.3 (H) 01/08/2021   INSULIN 33.2 (H) 11/09/2019   Lab Results  Component Value Date   TSH 1.070 06/11/2022   Lab Results  Component Value Date   CHOL 223 (H) 06/11/2022   HDL 82  06/11/2022   LDLCALC 130 (H) 06/11/2022   TRIG 66 06/11/2022   CHOLHDL 3.4 04/28/2019   Lab Results  Component Value Date   VD25OH 33.6 06/11/2022   VD25OH 56.0 11/26/2021   VD25OH 71.9 10/07/2021   Lab Results  Component Value Date   WBC 8.8 01/12/2022   HGB 12.1 01/12/2022   HCT 36.6 01/12/2022   MCV 94.6 01/12/2022   PLT 338.0 01/12/2022   Lab Results  Component Value Date   IRON 77 01/12/2022   FERRITIN 131.2 01/12/2022   Attestation Statements:   Reviewed by clinician on day of visit: allergies, medications, problem list, medical history, surgical history, family history, social history, and previous encounter notes.   I, Trixie Dredge, am acting as transcriptionist for Dennard Nip, MD.  I have reviewed the above documentation for accuracy and completeness, and I agree with the above. -  Dennard Nip, MD

## 2022-09-10 ENCOUNTER — Other Ambulatory Visit (HOSPITAL_COMMUNITY): Payer: Self-pay

## 2022-09-10 ENCOUNTER — Encounter (INDEPENDENT_AMBULATORY_CARE_PROVIDER_SITE_OTHER): Payer: Self-pay | Admitting: Family Medicine

## 2022-09-10 ENCOUNTER — Ambulatory Visit (INDEPENDENT_AMBULATORY_CARE_PROVIDER_SITE_OTHER): Payer: 59 | Admitting: Family Medicine

## 2022-09-10 VITALS — BP 128/89 | HR 106 | Temp 98.0°F | Ht 65.0 in | Wt 189.0 lb

## 2022-09-10 DIAGNOSIS — Z6833 Body mass index (BMI) 33.0-33.9, adult: Secondary | ICD-10-CM

## 2022-09-10 DIAGNOSIS — Z6831 Body mass index (BMI) 31.0-31.9, adult: Secondary | ICD-10-CM

## 2022-09-10 DIAGNOSIS — I10 Essential (primary) hypertension: Secondary | ICD-10-CM | POA: Diagnosis not present

## 2022-09-10 DIAGNOSIS — E669 Obesity, unspecified: Secondary | ICD-10-CM

## 2022-09-10 DIAGNOSIS — F3289 Other specified depressive episodes: Secondary | ICD-10-CM

## 2022-09-10 MED ORDER — SEMAGLUTIDE-WEIGHT MANAGEMENT 1.7 MG/0.75ML ~~LOC~~ SOAJ
1.7000 mg | SUBCUTANEOUS | 0 refills | Status: DC
  Filled 2022-09-10: qty 3, 28d supply, fill #0
  Filled 2022-10-03: qty 3, 28d supply, fill #1
  Filled 2022-10-31: qty 3, 28d supply, fill #2

## 2022-09-10 MED ORDER — ESCITALOPRAM OXALATE 20 MG PO TABS
20.0000 mg | ORAL_TABLET | Freq: Every day | ORAL | 0 refills | Status: DC
  Filled 2022-09-10: qty 90, 90d supply, fill #0

## 2022-09-15 NOTE — Progress Notes (Signed)
Chief Complaint:   OBESITY Yvonne [name] is here to discuss her progress with her obesity treatment plan along with follow-up of her obesity related diagnoses. Yvonne [name] is on following a lower carbohydrate, vegetable and lean protein rich diet plan and states she is following her eating plan approximately 85% of the time. Yvonne [name] states she is walking for 30 minutes 7 times per week.  Today's visit was #: 81 Starting weight: 204 lbs Starting date: 05/17/2017 Today's weight: 189 lbs Today's date: 09/10/2022 Total lbs lost to date: 15 Total lbs lost since last in-office visit: 1  Interim History: Yvonne [name] continues to work on her diet and exercise.  Her hunger is mostly controlled.  She denies GI upset.  She is adding some fruit to her low carbohydrate plan, but she is still able to lose weight.  She is still drinking some full sugar soda but she is trying to decrease her portion.  Subjective:   1. Hypertension, unspecified type Yvonne [name]'s blood pressure is mostly controlled although her diastolic reading is a bit elevated today.  2. Other depression, emotional eating behaviors Yvonne [name] has increased stress and she feels her emotional eating behaviors have increased.  She is open to increasing her dose of Lexapro.  Assessment/Plan:   1. Hypertension, unspecified type [place] will continue with her diet, exercise, and medications, and we will recheck her blood pressure in 1 month.  2. Other depression, emotional eating behaviors Yvonne [name] agreed to increase Lexapro to 20 mg once daily, and we will refill for 90 days.  - escitalopram (LEXAPRO) 20 MG tablet; Take 1 tablet (20 mg total) by mouth daily.  Dispense: 90 tablet; Refill: 0  3. Obesity, Current BMI 31.5 Yvonne [name] is currently in the action stage of change. As such, her goal is to continue with weight loss efforts. She has agreed to following a lower carbohydrate, vegetable and lean protein rich diet plan + with fruit options.   We discussed  various medication options to help [name] with her weight loss efforts and we both agreed to continue Wegovy 1.7 mg once weekly, and we will refill for 90 days.  - Semaglutide-Weight Management 1.7 MG/0.75ML SOAJ; Inject 1.7 mg into the skin once a week.  Dispense: 9 mL; Refill: 0  Exercise goals: As is.   Behavioral modification strategies: increasing lean protein intake and emotional eating strategies.  Yvonne [name] has agreed to follow-up with our clinic in 4 weeks. She was informed of the importance of frequent follow-up visits to maximize her success with intensive lifestyle modifications for her multiple health conditions.   Objective:   Blood pressure 128/89, pulse (!) 106, temperature 98 F (36.7 C), height '5\' 5"'$  (1.651 m), weight 189 lb (85.7 kg), SpO2 98 %. Body mass index is 31.45 kg/m.  General: Cooperative, alert, well developed, in no acute distress. HEENT: Conjunctivae and lids unremarkable. Cardiovascular: Regular rhythm.  Lungs: Normal work of breathing. Neurologic: No focal deficits.   Lab Results  Component Value Date   CREATININE 0.87 06/11/2022   BUN 17 06/11/2022   NA 143 06/11/2022   K 4.5 06/11/2022   CL 106 06/11/2022   CO2 22 06/11/2022   Lab Results  Component Value Date   ALT 15 06/11/2022   AST 11 06/11/2022   ALKPHOS 152 (H) 06/11/2022   BILITOT 0.3 06/11/2022   Lab Results  Component Value Date   HGBA1C 5.8 (H) 06/11/2022   HGBA1C 5.7 (H) 11/26/2021   HGBA1C 5.8 (H) 10/07/2021   HGBA1C 6.1 (  H) 01/08/2021   HGBA1C 5.9 (H) 11/09/2019   Lab Results  Component Value Date   INSULIN 48.7 (H) 06/11/2022   INSULIN 55.8 (H) 11/26/2021   INSULIN 49.9 (H) 10/07/2021   INSULIN 37.3 (H) 01/08/2021   INSULIN 33.2 (H) 11/09/2019   Lab Results  Component Value Date   TSH 1.070 06/11/2022   Lab Results  Component Value Date   CHOL 223 (H) 06/11/2022   HDL 82 06/11/2022   LDLCALC 130 (H) 06/11/2022   TRIG 66 06/11/2022   CHOLHDL 3.4 04/28/2019    Lab Results  Component Value Date   VD25OH 33.6 06/11/2022   VD25OH 56.0 11/26/2021   VD25OH 71.9 10/07/2021   Lab Results  Component Value Date   WBC 8.8 01/12/2022   HGB 12.1 01/12/2022   HCT 36.6 01/12/2022   MCV 94.6 01/12/2022   PLT 338.0 01/12/2022   Lab Results  Component Value Date   IRON 77 01/12/2022   FERRITIN 131.2 01/12/2022   Attestation Statements:   Reviewed by clinician on day of visit: allergies, medications, problem list, medical history, surgical history, family history, social history, and previous encounter notes.   I, Trixie Dredge, am acting as transcriptionist for Dennard Nip, MD.  I have reviewed the above documentation for accuracy and completeness, and I agree with the above. -  Dennard Nip, MD

## 2022-09-30 ENCOUNTER — Ambulatory Visit (INDEPENDENT_AMBULATORY_CARE_PROVIDER_SITE_OTHER): Payer: 59 | Admitting: Family Medicine

## 2022-09-30 ENCOUNTER — Encounter (INDEPENDENT_AMBULATORY_CARE_PROVIDER_SITE_OTHER): Payer: Self-pay | Admitting: Family Medicine

## 2022-09-30 VITALS — BP 120/85 | HR 100 | Temp 97.6°F | Ht 65.0 in | Wt 189.0 lb

## 2022-09-30 DIAGNOSIS — Z6831 Body mass index (BMI) 31.0-31.9, adult: Secondary | ICD-10-CM

## 2022-09-30 DIAGNOSIS — E669 Obesity, unspecified: Secondary | ICD-10-CM

## 2022-09-30 DIAGNOSIS — F3289 Other specified depressive episodes: Secondary | ICD-10-CM

## 2022-09-30 DIAGNOSIS — I10 Essential (primary) hypertension: Secondary | ICD-10-CM

## 2022-10-03 ENCOUNTER — Other Ambulatory Visit (HOSPITAL_COMMUNITY): Payer: Self-pay

## 2022-10-05 ENCOUNTER — Other Ambulatory Visit (HOSPITAL_COMMUNITY): Payer: Self-pay

## 2022-10-05 NOTE — Progress Notes (Signed)
Chief Complaint:   OBESITY Yvonne [name] is here to discuss her progress with her obesity treatment plan along with follow-up of her obesity related diagnoses. Yvonne [name] is on following a lower carbohydrate, vegetable and lean protein rich diet plan with fruit options and states she is following her eating plan approximately 85% of the time. Yvonne [name] states she is walking for 30 minutes 7 times per week.  Today's visit was #: 19 Starting weight: 204 lbs Starting date: 05/17/2017 Today's weight: 189 lbs Today's date: 09/30/2022 Total lbs lost to date: 15 Total lbs lost since last in-office visit: 0  Interim History: Yvonne [name] has done well with maintaining her weight. She is decreasing bread and pasta, but she is still eating fruit and yogurt, and other healthier carbohydrates.   Subjective:   1. Essential hypertension Yvonne [name]'s blood pressure is stable on her medications, and she has no signs of hypotension. She denies headache.   2. Other depression, emotional eating behaviors Yvonne [name] is stable on Lexapro, with no side effects noted. She notes she is sleeping better at night and taking less naps in the daytime.   Assessment/Plan:   1. Essential hypertension Yvonne [name] will continue Cozaar, diet, and exercise and will continue to monitor.  2. Other depression, emotional eating behaviors Yvonne [name] will continue Lexapro and will continue to manage her medications.   3. Obesity, Current BMI 31.6 Yvonne [name] is currently in the action stage of change. As such, her goal is to continue with weight loss efforts. She has agreed to practicing portion control and making smarter food choices, such as increasing vegetables and decreasing simple carbohydrates.   Exercise goals: As is.   Behavioral modification strategies: decreasing simple carbohydrates.  Yvonne [name] has agreed to follow-up with our clinic in 3 to 4 weeks. She was informed of the importance of frequent follow-up visits to maximize her success with  intensive lifestyle modifications for her multiple health conditions.   Objective:   Blood pressure 120/85, pulse 100, temperature 97.6 F (36.4 C), height '5\' 5"'$  (1.651 m), weight 189 lb (85.7 kg), last menstrual period 12/05/2021, SpO2 99 %. Body mass index is 31.45 kg/m.  General: Cooperative, alert, well developed, in no acute distress. HEENT: Conjunctivae and lids unremarkable. Cardiovascular: Regular rhythm.  Lungs: Normal work of breathing. Neurologic: No focal deficits.   Lab Results  Component Value Date   CREATININE 0.87 06/11/2022   BUN 17 06/11/2022   NA 143 06/11/2022   K 4.5 06/11/2022   CL 106 06/11/2022   CO2 22 06/11/2022   Lab Results  Component Value Date   ALT 15 06/11/2022   AST 11 06/11/2022   ALKPHOS 152 (H) 06/11/2022   BILITOT 0.3 06/11/2022   Lab Results  Component Value Date   HGBA1C 5.8 (H) 06/11/2022   HGBA1C 5.7 (H) 11/26/2021   HGBA1C 5.8 (H) 10/07/2021   HGBA1C 6.1 (H) 01/08/2021   HGBA1C 5.9 (H) 11/09/2019   Lab Results  Component Value Date   INSULIN 48.7 (H) 06/11/2022   INSULIN 55.8 (H) 11/26/2021   INSULIN 49.9 (H) 10/07/2021   INSULIN 37.3 (H) 01/08/2021   INSULIN 33.2 (H) 11/09/2019   Lab Results  Component Value Date   TSH 1.070 06/11/2022   Lab Results  Component Value Date   CHOL 223 (H) 06/11/2022   HDL 82 06/11/2022   LDLCALC 130 (H) 06/11/2022   TRIG 66 06/11/2022   CHOLHDL 3.4 04/28/2019   Lab Results  Component Value Date   VD25OH 33.6 06/11/2022  VD25OH 56.0 11/26/2021   VD25OH 71.9 10/07/2021   Lab Results  Component Value Date   WBC 8.8 01/12/2022   HGB 12.1 01/12/2022   HCT 36.6 01/12/2022   MCV 94.6 01/12/2022   PLT 338.0 01/12/2022   Lab Results  Component Value Date   IRON 77 01/12/2022   FERRITIN 131.2 01/12/2022   Attestation Statements:   Reviewed by clinician on day of visit: allergies, medications, problem list, medical history, surgical history, family history, social history,  and previous encounter notes.   I, Trixie Dredge, am acting as transcriptionist for Dennard Nip, MD.  I have reviewed the above documentation for accuracy and completeness, and I agree with the above. -  Dennard Nip, MD

## 2022-10-08 ENCOUNTER — Ambulatory Visit (INDEPENDENT_AMBULATORY_CARE_PROVIDER_SITE_OTHER): Payer: 59 | Admitting: Family Medicine

## 2022-10-16 ENCOUNTER — Other Ambulatory Visit (HOSPITAL_COMMUNITY): Payer: Self-pay

## 2022-10-16 ENCOUNTER — Telehealth: Payer: 59 | Admitting: Physician Assistant

## 2022-10-16 DIAGNOSIS — H109 Unspecified conjunctivitis: Secondary | ICD-10-CM

## 2022-10-16 DIAGNOSIS — B9689 Other specified bacterial agents as the cause of diseases classified elsewhere: Secondary | ICD-10-CM

## 2022-10-16 MED ORDER — POLYMYXIN B-TRIMETHOPRIM 10000-0.1 UNIT/ML-% OP SOLN
1.0000 [drp] | OPHTHALMIC | 0 refills | Status: DC
  Filled 2022-10-16: qty 10, 5d supply, fill #0

## 2022-10-16 NOTE — Progress Notes (Signed)

## 2022-10-23 ENCOUNTER — Telehealth: Payer: Self-pay | Admitting: Family

## 2022-10-23 ENCOUNTER — Ambulatory Visit: Payer: 59 | Admitting: Family

## 2022-10-23 ENCOUNTER — Encounter: Payer: Self-pay | Admitting: Family

## 2022-10-23 VITALS — BP 109/76 | HR 89 | Temp 98.1°F | Resp 16 | Ht 65.0 in | Wt 193.0 lb

## 2022-10-23 DIAGNOSIS — F3289 Other specified depressive episodes: Secondary | ICD-10-CM | POA: Diagnosis not present

## 2022-10-23 DIAGNOSIS — Z1159 Encounter for screening for other viral diseases: Secondary | ICD-10-CM | POA: Diagnosis not present

## 2022-10-23 DIAGNOSIS — E538 Deficiency of other specified B group vitamins: Secondary | ICD-10-CM

## 2022-10-23 DIAGNOSIS — E559 Vitamin D deficiency, unspecified: Secondary | ICD-10-CM

## 2022-10-23 DIAGNOSIS — R7303 Prediabetes: Secondary | ICD-10-CM | POA: Diagnosis not present

## 2022-10-23 DIAGNOSIS — Z23 Encounter for immunization: Secondary | ICD-10-CM

## 2022-10-23 DIAGNOSIS — I10 Essential (primary) hypertension: Secondary | ICD-10-CM

## 2022-10-23 LAB — BASIC METABOLIC PANEL WITH GFR
BUN: 17 mg/dL (ref 6–23)
CO2: 26 meq/L (ref 19–32)
Calcium: 9.7 mg/dL (ref 8.4–10.5)
Chloride: 109 meq/L (ref 96–112)
Creatinine, Ser: 0.94 mg/dL (ref 0.40–1.20)
GFR: 70.95 mL/min
Glucose, Bld: 102 mg/dL — ABNORMAL HIGH (ref 70–99)
Potassium: 4.4 meq/L (ref 3.5–5.1)
Sodium: 141 meq/L (ref 135–145)

## 2022-10-23 LAB — VITAMIN B12: Vitamin B-12: 199 pg/mL — ABNORMAL LOW (ref 211–911)

## 2022-10-23 LAB — VITAMIN D 25 HYDROXY (VIT D DEFICIENCY, FRACTURES): VITD: 38.48 ng/mL (ref 30.00–100.00)

## 2022-10-23 NOTE — Telephone Encounter (Signed)
Please call [facility] on [address] to request copy of DM eye exam.

## 2022-10-23 NOTE — Progress Notes (Signed)
Subjective:     Patient ID: Yvonne [name], female    DOB: 10-20-1971, 51 y.o.   MRN: [id number]  Chief Complaint  Patient presents with   Hypertension    Here for follow up    HPI Patient is in today for follow up.   HTN- she is maintained on losartan 100m.  BP Readings from Last 3 Encounters:  10/23/22 109/76  09/30/22 120/85  09/10/22 128/89   Obesity- continued on Wegovy 1.760mweekly.   Wt Readings from Last 3 Encounters:  10/23/22 193 lb (87.5 kg)  09/30/22 189 lb (85.7 kg)  09/10/22 189 lb (85.7 kg)   Borderline diabetes type 2- Lab Results  Component Value Date   HGBA1C 5.8 (H) 06/11/2022   HGBA1C 5.7 (H) 11/26/2021   HGBA1C 5.8 (H) 10/07/2021   Lab Results  Component Value Date   MICROALBUR 3.9 (H) 01/12/2022   LDLCALC 130 (H) 06/11/2022   CREATININE 0.87 06/11/2022   Hyperlipidemia- not on statin.  Lab Results  Component Value Date   CHOL 223 (H) 06/11/2022   HDL 82 06/11/2022   LDLCALC 130 (H) 06/11/2022   TRIG 66 06/11/2022   CHOLHDL 3.4 04/28/2019   Vit D deficiency- not currently on supplement.    Health Maintenance Due  Topic Date Due   Hepatitis C Screening  Never done   OPHTHALMOLOGY EXAM  05/25/2019   COVID-19 Vaccine (4 - Pfizer risk series) 02/14/2021    Past Medical History:  Diagnosis Date   Anemia    Arthritis    Borderline type 2 diabetes mellitus    High blood pressure    Joint pain    Kidney stones    Microscopic hematuria    Ovarian torsion 2022   Vitamin D deficiency     Past Surgical History:  Procedure Laterality Date   LAPAROTOMY N/A 12/02/2021   Procedure: EXPLORATORY LAPAROTOMY,REMOVAL OF RIGHT TUBE AND OVARY;  Surgeon: TuLafonda MossesMD;  Location: [facility];  Service: Gynecology;  Laterality: N/A;   NO PAST SURGERIES     Denies surgical history    Family History  Problem Relation Age of Onset   Other Mother        deceased from multiple myelomas   Cancer Mother    Healthy Father    Congestive  Heart Failure Father    Heart disease Father        CABG   Diabetes Father    Coronary artery disease Father    Cancer Maternal Grandmother    Multiple myeloma Maternal Grandmother    Cancer Maternal Grandfather        unsure what type of cancer   Diabetes Paternal Grandmother    Prostate cancer Paternal Grandmother    Colon cancer Neg Hx    Esophageal cancer Neg Hx    Liver cancer Neg Hx    Pancreatic cancer Neg Hx    Rectal cancer Neg Hx    Stomach cancer Neg Hx     Social History   Socioeconomic History   Marital status: Married    Spouse name: Not on file   Number of children: 3   Years of education: Not on file   Highest education level: Not on file  Occupational History   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    Employer: [employer]  Tobacco Use   Smoking status: Never   Smokeless tobacco: Never  Vaping Use   Vaping Use: Never used  Substance and Sexual Activity   Alcohol  use: No   Drug use: No   Sexual activity: Yes    Partners: Male    Birth control/protection: Pill  Other Topics Concern   Not on file  Social History Narrative   5 children (3 are out of the house) 3 biiological and 2 step   Married to [employer]   Works in patient Access at [employer]   12/16/21 lives with husband one daughter at home who is 57 yr            Social Determinants of Health   Financial Resource Strain: [place]  (01/27/2022)   Overall Financial Resource Strain (CARDIA)    Difficulty of Paying Living Expenses: Not hard at all  Food Insecurity: No Food Insecurity (01/27/2022)   Hunger Vital Sign    Worried About Running Out of Food in the Last Year: Never true    [place] in the Last Year: Never true  Transportation Needs: No Transportation Needs (01/27/2022)   PRAPARE - Hydrologist (Medical): No    Lack of Transportation (Non-Medical): No  Physical Activity: Sufficiently Active (01/27/2022)   Exercise Vital Sign    Days of Exercise  per Week: 5 days    Minutes of Exercise per Session: 30 min  Stress: Not on file  Social Connections: Not on file  Intimate Partner Violence: Not on file    Outpatient Medications Prior to Visit  Medication Sig Dispense Refill   escitalopram (LEXAPRO) 20 MG tablet Take 1 tablet (20 mg total) by mouth daily. 90 tablet 0   losartan (COZAAR) 25 MG tablet Take 1 tablet (25 mg total) by mouth daily. ( Stop lisinopril) 90 tablet 3   Semaglutide-Weight Management 1.7 MG/0.75ML SOAJ Inject 1.7 mg into the skin once a week. 9 mL 0   trimethoprim-polymyxin b (POLYTRIM) ophthalmic solution Place 1 drop into both eyes every 4 (four) hours for 5 days 10 mL 0   No facility-administered medications prior to visit.    Allergies  Allergen Reactions   Red Blood Cells     Jehovah's Witness, does not want to receive blood    ROS See HPI    Objective:    Physical Exam Constitutional:      General: She is not in acute distress.    Appearance: Normal appearance. She is well-developed.  HENT:     Head: Normocephalic and atraumatic.     Right Ear: External ear normal.     Left Ear: External ear normal.  Eyes:     General: No scleral icterus. Neck:     Thyroid: No thyromegaly.  Cardiovascular:     Rate and Rhythm: Normal rate and regular rhythm.     Heart sounds: Normal heart sounds. No murmur heard. Pulmonary:     Effort: Pulmonary effort is normal. No respiratory distress.     Breath sounds: Normal breath sounds. No wheezing.  Musculoskeletal:     Cervical back: Neck supple.  Skin:    General: Skin is warm and dry.  Neurological:     Mental Status: She is alert and oriented to person, place, and time.  Psychiatric:        Mood and Affect: Mood normal.        Behavior: Behavior normal.        Thought Content: Thought content normal.        Judgment: Judgment normal.     BP 109/76 (BP Location: Right Arm, Patient Position:  Sitting, Cuff Size: Small)   Pulse 89   Temp 98.1 F (36.7  C) (Oral)   Resp 16   Ht _0  (1.651 m)   Wt 193 lb (87.5 kg)   LMP 12/05/2021   SpO2 98%   BMI 32.12 kg/m  Wt Readings from Last 3 Encounters:  10/23/22 193 lb (87.5 kg)  09/30/22 189 lb (85.7 kg)  09/10/22 189 lb (85.7 kg)       Assessment & Plan:   Problem List Items Addressed This Visit       Unprioritized   Vitamin D deficiency    Check follow up vit D level.       Relevant Orders   VITAMIN D 25 Hydroxy (Vit-D Deficiency, Fractures)   Hypertension - Primary    BP at goal Continue losartan.       Relevant Orders   Basic Metabolic Panel (BMET)   Depression    Mood is stable.  Reports that she is taking lexapro to help with emotional eating.      Borderline type 2 diabetes mellitus    A1C is stable.  Wt Readings from Last 3 Encounters:  10/23/22 193 lb (87.5 kg)  09/30/22 189 lb (85.7 kg)  09/10/22 189 lb (85.7 kg)        Other Visit Diagnoses     Need for hepatitis C screening test       Relevant Orders   Hepatitis C Antibody   B12 deficiency       Relevant Orders   B12   Need for shingles vaccine       Relevant Orders   Zoster Recombinant (Shingrix ) (Completed)       I have discontinued Apple D. Garr's trimethoprim-polymyxin b. I am also having her maintain her losartan, Semaglutide-Weight Management, and escitalopram.  No orders of the defined types were placed in this encounter.

## 2022-10-23 NOTE — Assessment & Plan Note (Signed)
A1C is stable.  Wt Readings from Last 3 Encounters:  10/23/22 193 lb (87.5 kg)  09/30/22 189 lb (85.7 kg)  09/10/22 189 lb (85.7 kg)

## 2022-10-23 NOTE — Assessment & Plan Note (Signed)
Check follow up vit D level.

## 2022-10-23 NOTE — Assessment & Plan Note (Signed)
BP at goal Continue losartan.

## 2022-10-23 NOTE — Assessment & Plan Note (Signed)
Mood is stable.  Reports that she is taking lexapro to help with emotional eating.

## 2022-10-24 ENCOUNTER — Other Ambulatory Visit (HOSPITAL_COMMUNITY): Payer: Self-pay

## 2022-10-24 LAB — HEPATITIS C ANTIBODY: Hepatitis C Ab: NONREACTIVE

## 2022-10-25 ENCOUNTER — Telehealth: Payer: Self-pay | Admitting: Family

## 2022-10-25 NOTE — Telephone Encounter (Signed)
B12 level is low.  I would like for her to begin b12 1072mg IM weekly for 4 weeks then monthly. She can stop oral b12.   Vit D calcium, sugar and electrolytes look good.

## 2022-10-26 NOTE — Telephone Encounter (Signed)
Request faxed

## 2022-10-26 NOTE — Telephone Encounter (Signed)
Spoke with patient. Pt states it was discuss at her office visit that instead of the B12 shots she could take OTC. Pt wants confirmation and how much she should take. Pt would like a call back on her cell phone. Number confirmed in her chart.

## 2022-10-26 NOTE — Telephone Encounter (Signed)
Patient was advised the instructions to take otc was given during visit, after getting B12 results provider decided to change to B12 injections. She was scheduled to come in for the first 4 injections.

## 2022-10-26 NOTE — Telephone Encounter (Signed)
Called but no answer, lvm

## 2022-10-27 ENCOUNTER — Encounter: Payer: Self-pay | Admitting: *Deleted

## 2022-10-27 MED ORDER — VITAMIN B-12 1000 MCG PO TABS: 1000.0000 ug | ORAL_TABLET | Freq: Every day | ORAL | Status: AC

## 2022-10-27 NOTE — Telephone Encounter (Signed)
I spoke to pt. She is not currently taking any oral b12.  Recommended that she start b12 1027mg PO daily otc. Will plan to repeat b12 level at her cpx. If B12 is still low at that time will plan to initiate b12 injections.   RRod Holler please cancel nurse visit b12 appointments. tks

## 2022-10-27 NOTE — Addendum Note (Signed)
Addended by: Debbrah Alar on: 10/27/2022 12:56 PM   Modules accepted: Orders

## 2022-10-27 NOTE — Telephone Encounter (Signed)
Appointments for B12 cancelled

## 2022-10-31 ENCOUNTER — Other Ambulatory Visit (HOSPITAL_COMMUNITY): Payer: Self-pay

## 2022-11-03 ENCOUNTER — Other Ambulatory Visit (HOSPITAL_COMMUNITY): Payer: Self-pay

## 2022-11-05 ENCOUNTER — Ambulatory Visit: Payer: 59

## 2022-11-10 ENCOUNTER — Encounter (INDEPENDENT_AMBULATORY_CARE_PROVIDER_SITE_OTHER): Payer: Self-pay | Admitting: Family Medicine

## 2022-11-10 ENCOUNTER — Other Ambulatory Visit (HOSPITAL_COMMUNITY): Payer: Self-pay

## 2022-11-10 ENCOUNTER — Ambulatory Visit (INDEPENDENT_AMBULATORY_CARE_PROVIDER_SITE_OTHER): Payer: 59 | Admitting: Family Medicine

## 2022-11-10 VITALS — BP 109/79 | HR 103 | Temp 97.6°F | Ht 65.0 in | Wt 185.0 lb

## 2022-11-10 DIAGNOSIS — E669 Obesity, unspecified: Secondary | ICD-10-CM | POA: Diagnosis not present

## 2022-11-10 DIAGNOSIS — F3289 Other specified depressive episodes: Secondary | ICD-10-CM

## 2022-11-10 DIAGNOSIS — Z6830 Body mass index (BMI) 30.0-30.9, adult: Secondary | ICD-10-CM

## 2022-11-10 DIAGNOSIS — E538 Deficiency of other specified B group vitamins: Secondary | ICD-10-CM

## 2022-11-10 DIAGNOSIS — I10 Essential (primary) hypertension: Secondary | ICD-10-CM | POA: Diagnosis not present

## 2022-11-10 MED ORDER — ESCITALOPRAM OXALATE 20 MG PO TABS
20.0000 mg | ORAL_TABLET | Freq: Every day | ORAL | 0 refills | Status: DC
  Filled 2022-11-10 – 2022-12-01 (×2): qty 90, 90d supply, fill #0

## 2022-11-10 MED ORDER — SEMAGLUTIDE-WEIGHT MANAGEMENT 1.7 MG/0.75ML ~~LOC~~ SOAJ
1.7000 mg | SUBCUTANEOUS | 0 refills | Status: DC
  Filled 2022-11-10: qty 9, 84d supply, fill #0
  Filled 2022-12-01 (×2): qty 3, 28d supply, fill #0
  Filled 2023-01-18: qty 3, 28d supply, fill #1

## 2022-11-12 ENCOUNTER — Ambulatory Visit: Payer: 59

## 2022-11-19 ENCOUNTER — Ambulatory Visit: Payer: 59

## 2022-11-23 NOTE — Progress Notes (Unsigned)
Chief Complaint:   OBESITY Yvonne [name] is here to discuss her progress with her obesity treatment plan along with follow-up of her obesity related diagnoses. Yvonne [name] is on practicing portion control and making smarter food choices, such as increasing vegetables and decreasing simple carbohydrates and states she is following her eating plan approximately 80% of the time. Yvonne [name] states she is walking for 30 minutes 5 times per week.  Today's visit was #: 47 Starting weight: 204 lbs Starting date: 05/17/2017 Today's weight: 185 lbs Today's date: 11/10/2022 Total lbs lost to date: 19 Total lbs lost since last in-office visit: 4  Interim History: Yvonne [name] continues to do well with weight loss. She is in a new position and she notes decreased emotional eating behaviors. Her hunger is controlled.   Subjective:   1. Hypertension, unspecified type Yvonne [name]'s blood pressure is controlled. She is doing well with her diet and weight loss. She has no signs of hypotension.   2. B12 deficiency Yvonne [name]'s last B12 level was low. She is on B12 OTC supplementations.   3. Other depression, emotional eating behaviors Yvonne [name]'s mood is stable on Lexapro, and she is doing well with decreasing emotional eating behaviors. No insomnia was noted.   Assessment/Plan:   1. Hypertension, unspecified type Yvonne [name] will continue with her diet, exercise, and medications.   2. B12 deficiency Yvonne [name] will continue OTC B12 supplement, and she will work on increasing B12 rich foods like lean meats.   3. Other depression, emotional eating behaviors Yvonne [name] will continue Lexapro 20 mg once daily, and we will refill for 90 days.   - escitalopram (LEXAPRO) 20 MG tablet; Take 1 tablet (20 mg total) by mouth daily.  Dispense: 90 tablet; Refill: 0  4. Obesity, Current BMI 30.9 Yvonne [name] is currently in the action stage of change. As such, her goal is to continue with weight loss efforts. She has agreed to practicing portion control  and making smarter food choices, such as increasing vegetables and decreasing simple carbohydrates.   Yvonne [name] will continue Wegovy 1.7 mg once weekly, and we will refill for 90 days.   - Semaglutide-Weight Management 1.7 MG/0.75ML SOAJ; Inject 1.7 mg into the skin once a week.  Dispense: 9 mL; Refill: 0  Exercise goals: As is.   Behavioral modification strategies: increasing lean protein intake and holiday eating strategies .  Yvonne [name] has agreed to follow-up with our clinic in 4 weeks. She was informed of the importance of frequent follow-up visits to maximize her success with intensive lifestyle modifications for her multiple health conditions.   Objective:   Blood pressure 109/79, pulse (!) 103, temperature 97.6 F (36.4 C), height '5\' 5"'$  (1.651 m), weight 185 lb (83.9 kg), last menstrual period 12/05/2021, SpO2 99 %. Body mass index is 30.79 kg/m.  General: Cooperative, alert, well developed, in no acute distress. HEENT: Conjunctivae and lids unremarkable. Cardiovascular: Regular rhythm.  Lungs: Normal work of breathing. Neurologic: No focal deficits.   Lab Results  Component Value Date   CREATININE 0.94 10/23/2022   BUN 17 10/23/2022   NA 141 10/23/2022   K 4.4 10/23/2022   CL 109 10/23/2022   CO2 26 10/23/2022   Lab Results  Component Value Date   ALT 15 06/11/2022   AST 11 06/11/2022   ALKPHOS 152 (H) 06/11/2022   BILITOT 0.3 06/11/2022   Lab Results  Component Value Date   HGBA1C 5.8 (H) 06/11/2022   HGBA1C 5.7 (H) 11/26/2021   HGBA1C 5.8 (H) 10/07/2021   HGBA1C  6.1 (H) 01/08/2021   HGBA1C 5.9 (H) 11/09/2019   Lab Results  Component Value Date   INSULIN 48.7 (H) 06/11/2022   INSULIN 55.8 (H) 11/26/2021   INSULIN 49.9 (H) 10/07/2021   INSULIN 37.3 (H) 01/08/2021   INSULIN 33.2 (H) 11/09/2019   Lab Results  Component Value Date   TSH 1.070 06/11/2022   Lab Results  Component Value Date   CHOL 223 (H) 06/11/2022   HDL 82 06/11/2022   LDLCALC 130 (H)  06/11/2022   TRIG 66 06/11/2022   CHOLHDL 3.4 04/28/2019   Lab Results  Component Value Date   VD25OH 38.48 10/23/2022   VD25OH 33.6 06/11/2022   VD25OH 56.0 11/26/2021   Lab Results  Component Value Date   WBC 8.8 01/12/2022   HGB 12.1 01/12/2022   HCT 36.6 01/12/2022   MCV 94.6 01/12/2022   PLT 338.0 01/12/2022   Lab Results  Component Value Date   IRON 77 01/12/2022   FERRITIN 131.2 01/12/2022   Attestation Statements:   Reviewed by clinician on day of visit: allergies, medications, problem list, medical history, surgical history, family history, social history, and previous encounter notes.   I, Yvonne [name], am acting as transcriptionist for Dennard Nip, MD.  I have reviewed the above documentation for accuracy and completeness, and I agree with the above. -  Dennard Nip, MD

## 2022-11-26 ENCOUNTER — Ambulatory Visit: Payer: 59

## 2022-11-27 ENCOUNTER — Other Ambulatory Visit (HOSPITAL_BASED_OUTPATIENT_CLINIC_OR_DEPARTMENT_OTHER): Payer: Self-pay

## 2022-12-01 ENCOUNTER — Other Ambulatory Visit (HOSPITAL_COMMUNITY): Payer: Self-pay

## 2022-12-01 ENCOUNTER — Other Ambulatory Visit (HOSPITAL_BASED_OUTPATIENT_CLINIC_OR_DEPARTMENT_OTHER): Payer: Self-pay

## 2022-12-04 ENCOUNTER — Other Ambulatory Visit (HOSPITAL_COMMUNITY): Payer: Self-pay

## 2022-12-05 ENCOUNTER — Other Ambulatory Visit (HOSPITAL_COMMUNITY): Payer: Self-pay

## 2022-12-09 ENCOUNTER — Encounter: Payer: Self-pay | Admitting: Internal Medicine

## 2022-12-10 ENCOUNTER — Other Ambulatory Visit (HOSPITAL_COMMUNITY): Payer: Self-pay

## 2022-12-16 ENCOUNTER — Other Ambulatory Visit (HOSPITAL_COMMUNITY): Payer: Self-pay

## 2022-12-16 ENCOUNTER — Ambulatory Visit (INDEPENDENT_AMBULATORY_CARE_PROVIDER_SITE_OTHER): Payer: Commercial Managed Care - PPO | Admitting: Family Medicine

## 2022-12-16 ENCOUNTER — Encounter (INDEPENDENT_AMBULATORY_CARE_PROVIDER_SITE_OTHER): Payer: Self-pay | Admitting: Family Medicine

## 2022-12-16 VITALS — BP 124/87 | HR 97 | Temp 97.5°F | Ht 65.0 in | Wt 187.0 lb

## 2022-12-16 DIAGNOSIS — F3289 Other specified depressive episodes: Secondary | ICD-10-CM | POA: Diagnosis not present

## 2022-12-16 DIAGNOSIS — Z6831 Body mass index (BMI) 31.0-31.9, adult: Secondary | ICD-10-CM | POA: Diagnosis not present

## 2022-12-16 DIAGNOSIS — E669 Obesity, unspecified: Secondary | ICD-10-CM | POA: Diagnosis not present

## 2022-12-16 DIAGNOSIS — E559 Vitamin D deficiency, unspecified: Secondary | ICD-10-CM

## 2022-12-16 MED ORDER — SEMAGLUTIDE-WEIGHT MANAGEMENT 1 MG/0.5ML ~~LOC~~ SOAJ
1.0000 mg | SUBCUTANEOUS | 0 refills | Status: DC
  Filled 2022-12-16: qty 2, 28d supply, fill #0

## 2022-12-21 ENCOUNTER — Other Ambulatory Visit (HOSPITAL_COMMUNITY): Payer: Self-pay

## 2022-12-23 ENCOUNTER — Other Ambulatory Visit (HOSPITAL_COMMUNITY): Payer: Self-pay

## 2022-12-28 NOTE — Progress Notes (Signed)
Chief Complaint:   OBESITY Yvonne [name] is here to discuss her progress with her obesity treatment plan along with follow-up of her obesity related diagnoses. [name] is on following a lower carbohydrate, vegetable and lean protein rich diet plan and states she is following her eating plan approximately 80% of the time. [name] states she is walking for 30 minutes 7 times per week.  Today's visit was #: 53 Starting weight: 204 lbs Starting date: 05/17/2017 Today's weight: 187 lbs Today's date: 12/16/2022 Total lbs lost to date: 17 Total lbs lost since last in-office visit: 0  Interim History: Yvonne [name] is working on her diet but she is struggling with hunger, and with shortages in her 1.7 mg dose of Wegovy.   Subjective:   1. Vitamin D deficiency Yvonne [name]'s last Vitamin D level was below goal. I discussed labs with the patient today.   2. Emotional Eating Behavior Yvonne [name] is doing  well on Lexapro. She is sleeping well and she does not need daytime naps. I discussed labs with the patient today.   Assessment/Plan:   1. Vitamin D deficiency Myrene agreed to start OTC Vitamin D 2,000 IU once daily.   2. Emotional Eating Behavior Yvonne [name] will continue Lexapro as is, and will continue to monitor.   3. Obesity with current BMI of 31.2 Jenesa agreed to decrease Wegovy to 1 mg once weekly, until 1.7 mg dose becomes available, and we will refill for 1 month.   - Semaglutide-Weight Management 1 MG/0.5ML SOAJ; Inject 1 mg into the skin once a week for 28 days.  Dispense: 2 mL; Refill: 0  Yvonne [name] is currently in the action stage of change. As such, her goal is to continue with weight loss efforts. She has agreed to following a lower carbohydrate, vegetable and lean protein rich diet plan.   Exercise goals: As is.   Behavioral modification strategies: increasing lean protein intake.  Yvonne [name] has agreed to follow-up with our clinic in 4 weeks. She was informed of the importance of frequent follow-up  visits to maximize her success with intensive lifestyle modifications for her multiple health conditions.   Objective:   Blood pressure 124/87, pulse 97, temperature (!) 97.5 F (36.4 C), height '5\' 5"'$  (1.651 m), weight 187 lb (84.8 kg), last menstrual period 12/05/2021, SpO2 97 %. Body mass index is 31.12 kg/m.  General: Cooperative, alert, well developed, in no acute distress. HEENT: Conjunctivae and lids unremarkable. Cardiovascular: Regular rhythm.  Lungs: Normal work of breathing. Neurologic: No focal deficits.   Lab Results  Component Value Date   CREATININE 0.94 10/23/2022   BUN 17 10/23/2022   NA 141 10/23/2022   K 4.4 10/23/2022   CL 109 10/23/2022   CO2 26 10/23/2022   Lab Results  Component Value Date   ALT 15 06/11/2022   AST 11 06/11/2022   ALKPHOS 152 (H) 06/11/2022   BILITOT 0.3 06/11/2022   Lab Results  Component Value Date   HGBA1C 5.8 (H) 06/11/2022   HGBA1C 5.7 (H) 11/26/2021   HGBA1C 5.8 (H) 10/07/2021   HGBA1C 6.1 (H) 01/08/2021   HGBA1C 5.9 (H) 11/09/2019   Lab Results  Component Value Date   INSULIN 48.7 (H) 06/11/2022   INSULIN 55.8 (H) 11/26/2021   INSULIN 49.9 (H) 10/07/2021   INSULIN 37.3 (H) 01/08/2021   INSULIN 33.2 (H) 11/09/2019   Lab Results  Component Value Date   TSH 1.070 06/11/2022   Lab Results  Component Value Date   CHOL 223 (H) 06/11/2022  HDL 82 06/11/2022   LDLCALC 130 (H) 06/11/2022   TRIG 66 06/11/2022   CHOLHDL 3.4 04/28/2019   Lab Results  Component Value Date   VD25OH 38.48 10/23/2022   VD25OH 33.6 06/11/2022   VD25OH 56.0 11/26/2021   Lab Results  Component Value Date   WBC 8.8 01/12/2022   HGB 12.1 01/12/2022   HCT 36.6 01/12/2022   MCV 94.6 01/12/2022   PLT 338.0 01/12/2022   Lab Results  Component Value Date   IRON 77 01/12/2022   FERRITIN 131.2 01/12/2022   Attestation Statements:   Reviewed by clinician on day of visit: allergies, medications, problem list, medical history, surgical  history, family history, social history, and previous encounter notes.   I, Trixie Dredge, am acting as transcriptionist for Dennard Nip, MD.  I have reviewed the above documentation for accuracy and completeness, and I agree with the above. -  Dennard Nip, MD

## 2022-12-31 ENCOUNTER — Ambulatory Visit (AMBULATORY_SURGERY_CENTER): Payer: Commercial Managed Care - PPO

## 2022-12-31 ENCOUNTER — Other Ambulatory Visit (HOSPITAL_COMMUNITY): Payer: Self-pay

## 2022-12-31 VITALS — Ht 65.0 in | Wt 187.0 lb

## 2022-12-31 DIAGNOSIS — Z86010 Personal history of colonic polyps: Secondary | ICD-10-CM

## 2022-12-31 MED ORDER — NA SULFATE-K SULFATE-MG SULF 17.5-3.13-1.6 GM/177ML PO SOLN
1.0000 | Freq: Once | ORAL | 0 refills | Status: AC
  Filled 2022-12-31: qty 354, 2d supply, fill #0

## 2022-12-31 NOTE — Progress Notes (Signed)

## 2023-01-02 ENCOUNTER — Other Ambulatory Visit (HOSPITAL_COMMUNITY): Payer: Self-pay

## 2023-01-04 ENCOUNTER — Other Ambulatory Visit (HOSPITAL_COMMUNITY): Payer: Self-pay

## 2023-01-04 MED ORDER — ESTRADIOL 0.1 MG/GM VA CREA
TOPICAL_CREAM | VAGINAL | 0 refills | Status: DC
  Filled 2023-01-04: qty 42.5, 85d supply, fill #0

## 2023-01-06 DIAGNOSIS — Z1231 Encounter for screening mammogram for malignant neoplasm of breast: Secondary | ICD-10-CM | POA: Diagnosis not present

## 2023-01-06 LAB — HM MAMMOGRAPHY

## 2023-01-13 ENCOUNTER — Encounter: Payer: Self-pay | Admitting: Internal Medicine

## 2023-01-18 ENCOUNTER — Ambulatory Visit (INDEPENDENT_AMBULATORY_CARE_PROVIDER_SITE_OTHER): Payer: Commercial Managed Care - PPO | Admitting: Family

## 2023-01-18 ENCOUNTER — Encounter: Payer: Self-pay | Admitting: Family

## 2023-01-18 ENCOUNTER — Telehealth: Payer: Self-pay | Admitting: Family

## 2023-01-18 ENCOUNTER — Other Ambulatory Visit (HOSPITAL_BASED_OUTPATIENT_CLINIC_OR_DEPARTMENT_OTHER): Payer: Self-pay

## 2023-01-18 ENCOUNTER — Other Ambulatory Visit (HOSPITAL_COMMUNITY): Payer: Self-pay

## 2023-01-18 VITALS — BP 106/81 | HR 88 | Temp 97.9°F | Resp 16 | Ht 66.0 in | Wt 193.0 lb

## 2023-01-18 DIAGNOSIS — R7303 Prediabetes: Secondary | ICD-10-CM | POA: Diagnosis not present

## 2023-01-18 DIAGNOSIS — Z23 Encounter for immunization: Secondary | ICD-10-CM

## 2023-01-18 DIAGNOSIS — Z807 Family history of other malignant neoplasms of lymphoid, hematopoietic and related tissues: Secondary | ICD-10-CM

## 2023-01-18 DIAGNOSIS — E785 Hyperlipidemia, unspecified: Secondary | ICD-10-CM | POA: Diagnosis not present

## 2023-01-18 DIAGNOSIS — Z780 Asymptomatic menopausal state: Secondary | ICD-10-CM | POA: Diagnosis not present

## 2023-01-18 DIAGNOSIS — Z0000 Encounter for general adult medical examination without abnormal findings: Secondary | ICD-10-CM

## 2023-01-18 DIAGNOSIS — Z01419 Encounter for gynecological examination (general) (routine) without abnormal findings: Secondary | ICD-10-CM | POA: Diagnosis not present

## 2023-01-18 DIAGNOSIS — E538 Deficiency of other specified B group vitamins: Secondary | ICD-10-CM

## 2023-01-18 DIAGNOSIS — I10 Essential (primary) hypertension: Secondary | ICD-10-CM | POA: Diagnosis not present

## 2023-01-18 DIAGNOSIS — R87610 Atypical squamous cells of undetermined significance on cytologic smear of cervix (ASC-US): Secondary | ICD-10-CM | POA: Diagnosis not present

## 2023-01-18 LAB — COMPREHENSIVE METABOLIC PANEL
ALT: 10 U/L (ref 0–35)
AST: 9 U/L (ref 0–37)
Albumin: 4.1 g/dL (ref 3.5–5.2)
Alkaline Phosphatase: 146 U/L — ABNORMAL HIGH (ref 39–117)
BUN: 15 mg/dL (ref 6–23)
CO2: 25 mEq/L (ref 19–32)
Calcium: 10.4 mg/dL (ref 8.4–10.5)
Chloride: 106 mEq/L (ref 96–112)
Creatinine, Ser: 0.92 mg/dL (ref 0.40–1.20)
GFR: 72.68 mL/min (ref >60.00)
Glucose, Bld: 94 mg/dL (ref 70–99)
Potassium: 4 mEq/L (ref 3.5–5.1)
Sodium: 140 mEq/L (ref 135–145)
Total Bilirubin: 0.5 mg/dL (ref 0.2–1.2)
Total Protein: 6.7 g/dL (ref 6.0–8.3)

## 2023-01-18 LAB — LIPID PANEL
Cholesterol: 231 mg/dL — ABNORMAL HIGH (ref 0–200)
HDL: 81.4 mg/dL (ref >39.00)
LDL Cholesterol: 131 mg/dL — ABNORMAL HIGH (ref 0–99)
NonHDL: 149.65
Total CHOL/HDL Ratio: 3
Triglycerides: 91 mg/dL (ref 0.0–149.0)
VLDL: 18.2 mg/dL (ref 0.0–40.0)

## 2023-01-18 LAB — MICROALBUMIN / CREATININE URINE RATIO
Creatinine,U: 126.7 mg/dL
Microalb Creat Ratio: 1 mg/g (ref 0.0–30.0)
Microalb, Ur: 1.3 mg/dL (ref 0.0–1.9)

## 2023-01-18 LAB — HEPATIC FUNCTION PANEL
ALT: 10 U/L (ref 0–35)
AST: 9 U/L (ref 0–37)
Albumin: 4.1 g/dL (ref 3.5–5.2)
Alkaline Phosphatase: 146 U/L — ABNORMAL HIGH (ref 39–117)
Bilirubin, Direct: 0.1 mg/dL (ref 0.0–0.3)
Total Bilirubin: 0.5 mg/dL (ref 0.2–1.2)
Total Protein: 6.7 g/dL (ref 6.0–8.3)

## 2023-01-18 LAB — HEMOGLOBIN A1C: Hgb A1c MFr Bld: 5.7 % (ref 4.6–6.5)

## 2023-01-18 LAB — VITAMIN B12: Vitamin B-12: 921 pg/mL — ABNORMAL HIGH (ref 211–911)

## 2023-01-18 MED ORDER — ATORVASTATIN CALCIUM 10 MG PO TABS
10.0000 mg | ORAL_TABLET | Freq: Every day | ORAL | 1 refills | Status: DC
  Filled 2023-01-18: qty 90, 90d supply, fill #0

## 2023-01-18 MED ORDER — LOSARTAN POTASSIUM 25 MG PO TABS
25.0000 mg | ORAL_TABLET | Freq: Every day | ORAL | 1 refills | Status: DC
  Filled 2023-01-18: qty 90, 90d supply, fill #0
  Filled 2023-04-21: qty 90, 90d supply, fill #1

## 2023-01-18 NOTE — Assessment & Plan Note (Signed)
Given hx of borderline DM2 and dad's hx of CAD with cabg, Recommended addition of low dose statin. She will repeat lipids in 6 weeks. Start atovarvastatin 58m once daily.

## 2023-01-18 NOTE — Addendum Note (Signed)
Addended by: Jiles Prows on: 01/18/2023 07:47 AM   Modules accepted: Orders

## 2023-01-18 NOTE — Progress Notes (Signed)
Subjective:     Patient ID: Yvonne [name], female    DOB: 01/26/1971, 52 y.o.   MRN: [id number]  Chief Complaint  Patient presents with   Annual Exam    HPI  Patient presents today for complete physical.  Immunizations:shigrix #2 and covid due Diet: Following at [facility], maintained on Wegovy 1.7 mg weekly Exercise: walks 5x a day Colonoscopy:01/21/18, schedule for Monday Pap Smear:01/01/2022-  Mammogram: completed at [facility]: up to date Dental: up to date   Lab Results  Component Value Date   HGBA1C 5.8 (H) 06/11/2022    HTN- maintained on losartan.  BP Readings from Last 3 Encounters:  01/18/23 106/81  12/16/22 124/87  11/10/22 109/79   Obesity-  Wt Readings from Last 3 Encounters:  01/18/23 193 lb (87.5 kg)  12/31/22 187 lb (84.8 kg)  12/16/22 187 lb (84.8 kg)    Health Maintenance Due  Topic Date Due   COVID-19 Vaccine (4 - 2023-24 season) 08/07/2022   HEMOGLOBIN A1C  12/12/2022   Zoster Vaccines- Shingrix (2 of 2) 12/18/2022   MAMMOGRAM  01/01/2023   Diabetic kidney evaluation - Urine ACR  01/12/2023    Past Medical History:  Diagnosis Date   Anemia    Anxiety    Arthritis    Borderline type 2 diabetes mellitus    High blood pressure    Joint pain    Kidney stones    Microscopic hematuria    Ovarian torsion 2022   Vitamin D deficiency     Past Surgical History:  Procedure Laterality Date   LAPAROTOMY N/A 12/02/2021   Procedure: EXPLORATORY LAPAROTOMY,REMOVAL OF RIGHT TUBE AND OVARY;  Surgeon: Lafonda Mosses, MD;  Location: [facility];  Service: Gynecology;  Laterality: N/A;    Family History  Problem Relation Age of Onset   Other Mother        deceased from multiple myelomas   Cancer Mother    Healthy Father    Congestive Heart Failure Father    Heart disease Father        CABG   Diabetes Father    Coronary artery disease Father    Cancer Maternal Grandmother    Multiple myeloma Maternal Grandmother     Cancer Maternal Grandfather        unsure what type of cancer   Diabetes Paternal Grandmother    Prostate cancer Paternal Grandmother    Colon cancer Neg Hx    Esophageal cancer Neg Hx    Liver cancer Neg Hx    Pancreatic cancer Neg Hx    Rectal cancer Neg Hx    Stomach cancer Neg Hx     Social History   Socioeconomic History   Marital status: Married    Spouse name: Not on file   Number of children: 3   Years of education: Not on file   Highest education level: Not on file  Occupational History   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    Employer: [employer]  Tobacco Use   Smoking status: Never   Smokeless tobacco: Never  Vaping Use   Vaping Use: Never used  Substance and Sexual Activity   Alcohol use: No   Drug use: No   Sexual activity: Yes    Partners: Male    Birth control/protection: Pill  Other Topics Concern   Not on file  Social History Narrative   5 children (3 are out of the house) 3 biological and 2 step   Married  to Yvonne [name]   Works in patient Access at [employer]   12/16/21 lives with husband one daughter at home who is 43 yr            Social Determinants of Health   Financial Resource Strain: Low Risk  (01/27/2022)   Overall Financial Resource Strain (CARDIA)    Difficulty of Paying Living Expenses: Not hard at all  Food Insecurity: No Food Insecurity (01/27/2022)   Hunger Vital Sign    Worried About Running Out of Food in the Last Year: Never true    Ran Out of Food in the Last Year: Never true  Transportation Needs: No Transportation Needs (01/27/2022)   PRAPARE - Hydrologist (Medical): No    Lack of Transportation (Non-Medical): No  Physical Activity: Sufficiently Active (01/27/2022)   Exercise Vital Sign    Days of Exercise per Week: 5 days    Minutes of Exercise per Session: 30 min  Stress: Not on file  Social Connections: Not on file  Intimate Partner Violence: Not on file    Outpatient Medications  Prior to Visit  Medication Sig Dispense Refill   Cholecalciferol (VITAMIN D) 50 MCG (2000 UT) tablet Take 2,000 Units by mouth daily.     cyanocobalamin (VITAMIN B12) 1000 MCG tablet Take 1 tablet (1,000 mcg total) by mouth daily.     escitalopram (LEXAPRO) 20 MG tablet Take 1 tablet (20 mg total) by mouth daily. 90 tablet 0   Semaglutide-Weight Management 1.7 MG/0.75ML SOAJ Inject 1.7 mg into the skin once a week. 9 mL 0   losartan (COZAAR) 25 MG tablet Take 1 tablet (25 mg total) by mouth daily. ( Stop lisinopril) 90 tablet 3   estradiol (ESTRACE) 0.1 MG/GM vaginal cream Insert 1/2 gram vaginally every day as directed 42.5 g 0   [START ON 02/12/2023] Semaglutide-Weight Management 1 MG/0.5ML SOAJ Inject 1 mg into the skin once a week for 28 days. (Patient not taking: Reported on 12/31/2022) 2 mL 0   No facility-administered medications prior to visit.    Allergies  Allergen Reactions   Red Blood Cells     Jehovah's Witness, does not want to receive blood    Review of Systems  Constitutional:  Negative for weight loss.  HENT:  Negative for congestion and hearing loss.   Eyes:  Negative for blurred vision.  Respiratory:  Negative for cough.   Cardiovascular:  Negative for leg swelling.  Gastrointestinal:  Negative for constipation and diarrhea.  Genitourinary:  Negative for dysuria and frequency.  Musculoskeletal:  Negative for joint pain and myalgias.  Skin:  Negative for rash.  Neurological:  Negative for headaches.  Psychiatric/Behavioral:  Negative for depression. The patient is not nervous/anxious.        Objective:    Physical Exam  BP 106/81 (BP Location: Right Arm, Patient Position: Sitting, Cuff Size: Large)   Pulse 88   Temp 97.9 F (36.6 C) (Oral)   Resp 16   Ht 5' 6"$  (1.676 m)   Wt 193 lb (87.5 kg)   LMP 12/05/2021   SpO2 99%   BMI 31.15 kg/m  Wt Readings from Last 3 Encounters:  01/18/23 193 lb (87.5 kg)  12/31/22 187 lb (84.8 kg)  12/16/22 187 lb (84.8  kg)   Physical Exam  Constitutional: She is oriented to person, place, and time. She appears well-developed and well-nourished. No distress.  HENT:  Head: Normocephalic and atraumatic.  Right Ear: Tympanic membrane  and ear canal normal.  Left Ear: Tympanic membrane and ear canal normal.  Mouth/Throat: Oropharynx is clear and moist.  Eyes: Pupils are equal, round, and reactive to light. No scleral icterus.  Neck: Normal range of motion. No thyromegaly present.  Cardiovascular: Normal rate and regular rhythm.   No murmur heard. Pulmonary/Chest: Effort normal and breath sounds normal. No respiratory distress. He has no wheezes. She has no rales. She exhibits no tenderness.  Abdominal: Soft. Bowel sounds are normal. She exhibits no distension and no mass. There is no tenderness. There is no rebound and no guarding.  Musculoskeletal: She exhibits no edema.  Lymphadenopathy:    She has no cervical adenopathy.  Neurological: She is alert and oriented to person, place, and time. She has normal patellar reflexes. She exhibits normal muscle tone. Coordination normal.  Skin: Skin is warm and dry.  Psychiatric: She has a normal mood and affect. Her behavior is normal. Judgment and thought content normal.        Assessment & Plan:       Assessment & Plan:   Problem List Items Addressed This Visit       Unprioritized   Preventative health care - Primary    Discussed healthy diet, exercise, weight loss. Shingrix #2 today. She is encouraged to get covid vaccine at her pharmacy. Pap/mammo up to date.       Prediabetes   Relevant Orders   HgB A1c   Urine Microalbumin w/creat. ratio   Comp Met (CMET)   Hypertension   Relevant Medications   losartan (COZAAR) 25 MG tablet   atorvastatin (LIPITOR) 10 MG tablet   Hyperlipidemia    Given hx of borderline DM2 and dad's hx of CAD with cabg, Recommended addition of low dose statin. She will repeat lipids in 6 weeks. Start atovarvastatin 12m  once daily.      Relevant Medications   losartan (COZAAR) 25 MG tablet   atorvastatin (LIPITOR) 10 MG tablet   Other Relevant Orders   Lipid panel   Lipid panel   Hepatic function panel   Family history of multiple myeloma   Relevant Orders   Protein Electrophoresis, (serum)   Borderline type 2 diabetes mellitus    Maintained on Wegovy per weight loss clinic.       Other Visit Diagnoses     B12 deficiency       Relevant Orders   B12       I have discontinued Lorree D. Arauz's estradiol. I have also changed her losartan. Additionally, I am having her start on atorvastatin. Lastly, I am having her maintain her cyanocobalamin, Semaglutide-Weight Management, escitalopram, and Vitamin D.  Meds ordered this encounter  Medications   losartan (COZAAR) 25 MG tablet    Sig: Take 1 tablet (25 mg total) by mouth daily.    Dispense:  90 tablet    Refill:  1    Order Specific Question:   Supervising Provider    Answer:   BPenni HomansA [[id number]]   atorvastatin (LIPITOR) 10 MG tablet    Sig: Take 1 tablet (10 mg total) by mouth daily.    Dispense:  90 tablet    Refill:  1    Order Specific Question:   Supervising Provider    Answer:   BPenni HomansA [[id number]]

## 2023-01-18 NOTE — Patient Instructions (Signed)
Please complete lab work prior to leaving.   

## 2023-01-18 NOTE — Telephone Encounter (Signed)
Can you please call and get mammo report from [facility]?

## 2023-01-18 NOTE — Telephone Encounter (Signed)
Record release form faxed

## 2023-01-18 NOTE — Assessment & Plan Note (Signed)
Maintained on Wegovy per weight loss clinic.

## 2023-01-18 NOTE — Assessment & Plan Note (Signed)
Discussed healthy diet, exercise, weight loss. Shingrix #2 today. She is encouraged to get covid vaccine at her pharmacy. Pap/mammo up to date.

## 2023-01-21 LAB — PROTEIN ELECTROPHORESIS, SERUM
Albumin ELP: 4 g/dL (ref 3.8–4.8)
Alpha 1: 0.3 g/dL (ref 0.2–0.3)
Alpha 2: 0.7 g/dL (ref 0.5–0.9)
Beta 2: 0.5 g/dL (ref 0.2–0.5)
Beta Globulin: 0.5 g/dL (ref 0.4–0.6)
Gamma Globulin: 1 g/dL (ref 0.8–1.7)
Total Protein: 7 g/dL (ref 6.1–8.1)

## 2023-01-25 ENCOUNTER — Encounter: Payer: Self-pay | Admitting: Internal Medicine

## 2023-01-25 ENCOUNTER — Ambulatory Visit (AMBULATORY_SURGERY_CENTER): Payer: Commercial Managed Care - PPO | Admitting: Internal Medicine

## 2023-01-25 VITALS — BP 126/90 | HR 92 | Temp 97.7°F | Resp 14 | Ht 65.0 in | Wt 192.0 lb

## 2023-01-25 DIAGNOSIS — Z1211 Encounter for screening for malignant neoplasm of colon: Secondary | ICD-10-CM | POA: Diagnosis not present

## 2023-01-25 DIAGNOSIS — Z09 Encounter for follow-up examination after completed treatment for conditions other than malignant neoplasm: Secondary | ICD-10-CM

## 2023-01-25 DIAGNOSIS — Z86010 Personal history of colonic polyps: Secondary | ICD-10-CM | POA: Diagnosis not present

## 2023-01-25 DIAGNOSIS — D124 Benign neoplasm of descending colon: Secondary | ICD-10-CM | POA: Diagnosis not present

## 2023-01-25 MED ORDER — SODIUM CHLORIDE 0.9 % IV SOLN: 500.0000 mL | Freq: Once | INTRAVENOUS | Status: DC

## 2023-01-25 NOTE — Progress Notes (Signed)
Report to pacu rn. Vss. Care resumed by rn. 

## 2023-01-25 NOTE — Progress Notes (Signed)
VS by DT  Pt's states no medical or surgical changes since previsit or office visit.  

## 2023-01-25 NOTE — Op Note (Signed)
[facility] Patient Name: Yvonne [name] Procedure Date: 01/25/2023 8:21 AM MRN: [id number] Endoscopist: Jerene Bears , MD, [id number] Age: 52 Referring MD:  Date of Birth: November 02, 1971 Gender: Female Account #: [id number] Procedure:                Colonoscopy Indications:              Surveillance: Personal history of adenomatous and                            sessile serrated polyps on last colonoscopy 5 years                            ago, Last colonoscopy: February 2019 Medicines:                Monitored Anesthesia Care Procedure:                Pre-Anesthesia Assessment:                           - Prior to the procedure, a History and Physical                            was performed, and patient medications and                            allergies were reviewed. The patient's tolerance of                            previous anesthesia was also reviewed. The risks                            and benefits of the procedure and the sedation                            options and risks were discussed with the patient.                            All questions were answered, and informed consent                            was obtained. Prior Anticoagulants: The patient has                            taken no anticoagulant or antiplatelet agents. ASA                            Grade Assessment: II - A patient with mild systemic                            disease. After reviewing the risks and benefits,                            the patient was deemed in satisfactory condition to  undergo the procedure.                           After obtaining informed consent, the colonoscope                            was passed under direct vision. Throughout the                            procedure, the patient's blood pressure, pulse, and                            oxygen saturations were monitored continuously. The                            CF [id number]  was introduced through the anus                            with the intention of advancing to the cecum. The                            scope was advanced to the sigmoid colon before the                            procedure was aborted. Medications were given. The                            colonoscopy was performed without difficulty. The                            patient tolerated the procedure well. The quality                            of the bowel preparation was poor. The rectum was                            photographed. Scope In: 8:28:09 AM Scope Out: 8:29:25 AM Total Procedure Duration: 0 hours 1 minute 16 seconds  Findings:                 The digital rectal exam was normal.                           Semi-solid stool was found in the rectum and in the                            sigmoid colon, interfering with visualization. Complications:            No immediate complications. Estimated Blood Loss:     Estimated blood loss: none. Impression:               - Preparation of the colon was poor.                           - Stool in the rectum and in the  sigmoid colon.                           - No specimens collected. Recommendation:           - Patient has a contact number available for                            emergencies. The signs and symptoms of potential                            delayed complications were discussed with the                            patient. Return to normal activities tomorrow.                            Written discharge instructions were provided to the                            patient.                           - Clear liquid diet.                           - Continue present medications.                           - Could offer repeat colonoscopy later today versus                            tomorrow versus another time in the near future. Jerene Bears, MD 01/25/2023 8:36:38 AM This report has been signed electronically.

## 2023-01-25 NOTE — Progress Notes (Signed)
GASTROENTEROLOGY PROCEDURE H&P NOTE   Primary Care Physician: Debbrah Alar, NP    Reason for Procedure:  History of colon polyps  Plan:    Colonoscopy  Patient is appropriate for endoscopic procedure(s) in the ambulatory ([facility]) setting.  The nature of the procedure, as well as the risks, benefits, and alternatives were carefully and thoroughly reviewed with the patient. Ample time for discussion and questions allowed. The patient understood, was satisfied, and agreed to proceed.     HPI: Yvonne [name] is a 52 y.o. female who presents for surveillance colonoscopy.  Medical history as below.  Tolerated the prep.  No recent chest pain or shortness of breath.  No abdominal pain today.  Past Medical History:  Diagnosis Date   Anemia    Anxiety    Arthritis    Borderline type 2 diabetes mellitus    High blood pressure    Joint pain    Kidney stones    Microscopic hematuria    Ovarian torsion 2022   Vitamin D deficiency     Past Surgical History:  Procedure Laterality Date   LAPAROTOMY N/A 12/02/2021   Procedure: EXPLORATORY LAPAROTOMY,REMOVAL OF RIGHT TUBE AND OVARY;  Surgeon: Lafonda Mosses, MD;  Location: [facility];  Service: Gynecology;  Laterality: N/A;    Prior to Admission medications   Medication Sig Start Date End Date Taking? Authorizing Provider  Cholecalciferol (VITAMIN D) 50 MCG (2000 UT) tablet Take 2,000 Units by mouth daily.   Yes [provider]  cyanocobalamin (VITAMIN B12) 1000 MCG tablet Take 1 tablet (1,000 mcg total) by mouth daily. 10/27/22  Yes Debbrah Alar, NP  escitalopram (LEXAPRO) 20 MG tablet Take 1 tablet (20 mg total) by mouth daily. 11/10/22  Yes Beasley, Caren D, MD  losartan (COZAAR) 25 MG tablet Take 1 tablet (25 mg total) by mouth daily. 01/18/23 07/17/23 Yes Debbrah Alar, NP  atorvastatin (LIPITOR) 10 MG tablet Take 1 tablet (10 mg total) by mouth daily. Patient not taking: Reported on 01/25/2023 01/18/23    Debbrah Alar, NP  Semaglutide-Weight Management 1.7 MG/0.75ML SOAJ Inject 1.7 mg into the skin once a week. 11/10/22   Dennard Nip D, MD    Current Outpatient Medications  Medication Sig Dispense Refill   Cholecalciferol (VITAMIN D) 50 MCG (2000 UT) tablet Take 2,000 Units by mouth daily.     cyanocobalamin (VITAMIN B12) 1000 MCG tablet Take 1 tablet (1,000 mcg total) by mouth daily.     escitalopram (LEXAPRO) 20 MG tablet Take 1 tablet (20 mg total) by mouth daily. 90 tablet 0   losartan (COZAAR) 25 MG tablet Take 1 tablet (25 mg total) by mouth daily. 90 tablet 1   atorvastatin (LIPITOR) 10 MG tablet Take 1 tablet (10 mg total) by mouth daily. (Patient not taking: Reported on 01/25/2023) 90 tablet 1   Semaglutide-Weight Management 1.7 MG/0.75ML SOAJ Inject 1.7 mg into the skin once a week. 9 mL 0   Current Facility-Administered Medications  Medication Dose Route Frequency Provider Last Rate Last Admin   0.9 %  sodium chloride infusion  500 mL Intravenous Once Zachory Mangual, Lajuan Lines, MD        Allergies as of 01/25/2023 - Review Complete 01/25/2023  Allergen Reaction Noted   Red blood cells  12/02/2021    Family History  Problem Relation Age of Onset   Other Mother        deceased from multiple myelomas   Cancer Mother    Healthy Father  Congestive Heart Failure Father    Heart disease Father        CABG   Diabetes Father    Coronary artery disease Father    Cancer Maternal Grandmother    Multiple myeloma Maternal Grandmother    Cancer Maternal Grandfather        unsure what type of cancer   Diabetes Paternal Grandmother    Prostate cancer Paternal Grandmother    Colon cancer Neg Hx    Esophageal cancer Neg Hx    Liver cancer Neg Hx    Pancreatic cancer Neg Hx    Rectal cancer Neg Hx    Stomach cancer Neg Hx     Social History   Socioeconomic History   Marital status: Married    Spouse name: Not on file   Number of children: 3   Years of education: Not on file    Highest education level: Not on file  Occupational History   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    Employer: [employer]  Tobacco Use   Smoking status: Never   Smokeless tobacco: Never  Vaping Use   Vaping Use: Never used  Substance and Sexual Activity   Alcohol use: No   Drug use: No   Sexual activity: Yes    Partners: Male    Birth control/protection: Pill  Other Topics Concern   Not on file  Social History Narrative   5 children (3 are out of the house) 3 biological and 2 step   Married to [employer]   Works in patient Access at [employer]   12/16/21 lives with husband one daughter at home who is 46 yr            Social Determinants of Health   Financial Resource Strain: Low Risk  (01/27/2022)   Overall Financial Resource Strain (CARDIA)    Difficulty of Paying Living Expenses: Not hard at all  Food Insecurity: No Food Insecurity (01/27/2022)   Hunger Vital Sign    Worried About Running Out of Food in the Last Year: Never true    Ran Out of Food in the Last Year: Never true  Transportation Needs: No Transportation Needs (01/27/2022)   PRAPARE - Hydrologist (Medical): No    Lack of Transportation (Non-Medical): No  Physical Activity: Sufficiently Active (01/27/2022)   Exercise Vital Sign    Days of Exercise per Week: 5 days    Minutes of Exercise per Session: 30 min  Stress: Not on file  Social Connections: Not on file  Intimate Partner Violence: Not on file    Physical Exam: Vital signs in last 24 hours: @BP$  (!) 131/92   Pulse 95   Temp 97.7 F (36.5 C)   Resp (!) 0   Ht 5' 5"$  (1.651 m)   Wt 192 lb (87.1 kg)   LMP 12/05/2021   SpO2 99%   BMI 31.95 kg/m  GEN: NAD EYE: Sclerae anicteric ENT: MMM CV: Non-tachycardic Pulm: CTA b/l GI: Soft, NT/ND NEURO:  Alert & Oriented x 3   Zenovia Jarred, MD [facility]  01/25/2023 8:26 AM

## 2023-01-26 ENCOUNTER — Encounter: Payer: Self-pay | Admitting: Internal Medicine

## 2023-01-26 ENCOUNTER — Telehealth: Payer: Self-pay | Admitting: *Deleted

## 2023-01-26 ENCOUNTER — Ambulatory Visit (AMBULATORY_SURGERY_CENTER): Payer: Commercial Managed Care - PPO | Admitting: Internal Medicine

## 2023-01-26 VITALS — BP 134/92 | HR 72 | Temp 98.9°F | Resp 18 | Ht 65.0 in | Wt 187.0 lb

## 2023-01-26 DIAGNOSIS — Z09 Encounter for follow-up examination after completed treatment for conditions other than malignant neoplasm: Secondary | ICD-10-CM

## 2023-01-26 DIAGNOSIS — K635 Polyp of colon: Secondary | ICD-10-CM | POA: Diagnosis not present

## 2023-01-26 DIAGNOSIS — D124 Benign neoplasm of descending colon: Secondary | ICD-10-CM

## 2023-01-26 DIAGNOSIS — Z86010 Personal history of colonic polyps: Secondary | ICD-10-CM

## 2023-01-26 DIAGNOSIS — Z1211 Encounter for screening for malignant neoplasm of colon: Secondary | ICD-10-CM | POA: Diagnosis not present

## 2023-01-26 HISTORY — PX: COLONOSCOPY: SHX174

## 2023-01-26 MED ORDER — SODIUM CHLORIDE 0.9 % IV SOLN: 500.0000 mL | Freq: Once | INTRAVENOUS | Status: DC

## 2023-01-26 NOTE — Telephone Encounter (Signed)
  Follow up Call-     01/25/2023    7:50 AM  Call back number  Post procedure Call Back phone  # [phone]  Permission to leave phone message Yes     Patient questions:  Do you have a fever, pain , or abdominal swelling? No. Pain Score  0 *  Have you tolerated food without any problems? Yes.    Have you been able to return to your normal activities? Yes.    Do you have any questions about your discharge instructions: Diet   No. Medications  No. Follow up visit  No.  Do you have questions or concerns about your Care? No.  Actions: * If pain score is 4 or above: No action needed, pain <4. Pt is coming back today to have colonoscopy done ,the patient was not adequately cleaned out yesterday so procedure rescheduled for today.

## 2023-01-26 NOTE — Op Note (Signed)
[facility] Patient Name: Yvonne [name] Procedure Date: 01/26/2023 1:23 PM MRN: [id number] Endoscopist: Jerene Bears , MD, [id number] Age: 52 Referring MD:  Date of Birth: 12/10/1970 Gender: Female Account #: [id number] Procedure:                Colonoscopy Indications:              High risk colon cancer surveillance: Personal                            history of sessile serrated colon polyp (less than                            10 mm in size) with no dysplasia and tubular                            adenoma at last colonoscopy: February 2019 Medicines:                Monitored Anesthesia Care Procedure:                Pre-Anesthesia Assessment:                           - Prior to the procedure, a History and Physical                            was performed, and patient medications and                            allergies were reviewed. The patient's tolerance of                            previous anesthesia was also reviewed. The risks                            and benefits of the procedure and the sedation                            options and risks were discussed with the patient.                            All questions were answered, and informed consent                            was obtained. Prior Anticoagulants: The patient has                            taken no anticoagulant or antiplatelet agents. ASA                            Grade Assessment: II - A patient with mild systemic                            disease. After reviewing the risks and benefits,  the patient was deemed in satisfactory condition to                            undergo the procedure.                           After obtaining informed consent, the colonoscope                            was passed under direct vision. Throughout the                            procedure, the patient's blood pressure, pulse, and                            oxygen saturations were  monitored continuously. The                            Olympus CF-HQ190L [id number]) Colonoscope was                            introduced through the anus and advanced to the                            cecum, identified by appendiceal orifice and                            ileocecal valve. The colonoscopy was performed                            without difficulty. The patient tolerated the                            procedure well. The quality of the bowel                            preparation was good with 2 day Suprep and MiraLax.                            The ileocecal valve, appendiceal orifice, and                            rectum were photographed. Scope In: 3:20:24 PM Scope Out: 3:35:56 PM Scope Withdrawal Time: 0 hours 12 minutes 10 seconds  Total Procedure Duration: 0 hours 15 minutes 32 seconds  Findings:                 The digital rectal exam was normal.                           A 5 mm polyp was found in the descending colon. The                            polyp was sessile. The polyp was removed with a  cold snare. Resection and retrieval were complete.                           The exam was otherwise without abnormality on                            direct and retroflexion views. Complications:            No immediate complications. Estimated Blood Loss:     Estimated blood loss: none. Impression:               - One 5 mm polyp in the descending colon, removed                            with a cold snare. Resected and retrieved.                           - The examination was otherwise normal on direct                            and retroflexion views. Recommendation:           - Patient has a contact number available for                            emergencies. The signs and symptoms of potential                            delayed complications were discussed with the                            patient. Return to normal activities tomorrow.                             Written discharge instructions were provided to the                            patient.                           - Resume previous diet.                           - Continue present medications.                           - Await pathology results.                           - Repeat colonoscopy is recommended for                            surveillance. The colonoscopy date will be                            determined after pathology results from today's  exam become available for review. Jerene Bears, MD 01/26/2023 3:39:17 PM This report has been signed electronically.

## 2023-01-26 NOTE — Patient Instructions (Signed)
Information on polyps given to you today.  Await pathology results.  Resume previous diet and medications.    YOU HAD AN ENDOSCOPIC PROCEDURE TODAY AT THE [facility]:   Refer to the procedure report that was given to you for any specific questions about what was found during the examination.  If the procedure report does not answer your questions, please call your gastroenterologist to clarify.  If you requested that your care partner not be given the details of your procedure findings, then the procedure report has been included in a sealed envelope for you to review at your convenience later.  YOU SHOULD EXPECT: Some feelings of bloating in the abdomen. Passage of more gas than usual.  Walking can help get rid of the air that was put into your GI tract during the procedure and reduce the bloating. If you had a lower endoscopy (such as a colonoscopy or flexible sigmoidoscopy) you may notice spotting of blood in your stool or on the toilet paper. If you underwent a bowel prep for your procedure, you may not have a normal bowel movement for a few days.  Please Note:  You might notice some irritation and congestion in your nose or some drainage.  This is from the oxygen used during your procedure.  There is no need for concern and it should clear up in a day or so.  SYMPTOMS TO REPORT IMMEDIATELY:  Following lower endoscopy (colonoscopy or flexible sigmoidoscopy):  Excessive amounts of blood in the stool  Significant tenderness or worsening of abdominal pains  Swelling of the abdomen that is new, acute  Fever of 100F or higher  For urgent or emergent issues, a gastroenterologist can be reached at any hour by calling [phone]. Do not use MyChart messaging for urgent concerns.    DIET:  We do recommend a small meal at first, but then you may proceed to your regular diet.  Drink plenty of fluids but you should avoid alcoholic beverages for 24 hours.  ACTIVITY:  You should  plan to take it easy for the rest of today and you should NOT DRIVE or use heavy machinery until tomorrow (because of the sedation medicines used during the test).    FOLLOW UP: Our staff will call the number listed on your records the next business day following your procedure.  We will call around 7:15- 8:00 am to check on you and address any questions or concerns that you may have regarding the information given to you following your procedure. If we do not reach you, we will leave a message.     If any biopsies were taken you will be contacted by phone or by letter within the next 1-3 weeks.  Please call us at [phone] if you have not heard about the biopsies in 3 weeks.    SIGNATURES/CONFIDENTIALITY: You and/or your care partner have signed paperwork which will be entered into your electronic medical record.  These signatures attest to the fact that that the information above on your After Visit Summary has been reviewed and is understood.  Full responsibility of the confidentiality of this discharge information lies with you and/or your care-partner.  

## 2023-01-26 NOTE — Progress Notes (Unsigned)
See H&P from yesterday Patient presenting for colonoscopy for history of polyps after an adequate prep yesterday  She remains appropriate for [place] colonoscopy today.

## 2023-01-26 NOTE — Progress Notes (Unsigned)
Pt's states no medical or surgical changes since previsit or office visit. 

## 2023-01-26 NOTE — Progress Notes (Unsigned)
Called to room to assist during endoscopic procedure.  Patient ID and intended procedure confirmed with present staff. Received instructions for my participation in the procedure from the performing physician.  

## 2023-01-26 NOTE — Progress Notes (Unsigned)
To pacu, VSS. Report to [email or web address] 

## 2023-01-27 ENCOUNTER — Telehealth: Payer: Self-pay

## 2023-01-27 NOTE — Telephone Encounter (Signed)
  Follow up Call-     01/26/2023    2:03 PM 01/25/2023    7:50 AM  Call back number  Post procedure Call Back phone  # [phone]  Permission to leave phone message Yes Yes     Patient questions:  Do you have a fever, pain , or abdominal swelling? No. Pain Score  0 *  Have you tolerated food without any problems? Yes.    Have you been able to return to your normal activities? Yes.    Do you have any questions about your discharge instructions: Diet   No. Medications  No. Follow up visit  No.  Do you have questions or concerns about your Care? No.  Actions: * If pain score is 4 or above: No action needed, pain <4.

## 2023-01-28 ENCOUNTER — Other Ambulatory Visit (HOSPITAL_COMMUNITY): Payer: Self-pay

## 2023-01-28 ENCOUNTER — Encounter (INDEPENDENT_AMBULATORY_CARE_PROVIDER_SITE_OTHER): Payer: Self-pay | Admitting: Family Medicine

## 2023-01-28 ENCOUNTER — Ambulatory Visit (INDEPENDENT_AMBULATORY_CARE_PROVIDER_SITE_OTHER): Payer: Commercial Managed Care - PPO | Admitting: Family Medicine

## 2023-01-28 VITALS — BP 109/78 | HR 93 | Temp 97.6°F | Ht 65.0 in | Wt 187.0 lb

## 2023-01-28 DIAGNOSIS — R7303 Prediabetes: Secondary | ICD-10-CM | POA: Diagnosis not present

## 2023-01-28 DIAGNOSIS — F3289 Other specified depressive episodes: Secondary | ICD-10-CM | POA: Diagnosis not present

## 2023-01-28 DIAGNOSIS — Z6831 Body mass index (BMI) 31.0-31.9, adult: Secondary | ICD-10-CM

## 2023-01-28 DIAGNOSIS — E669 Obesity, unspecified: Secondary | ICD-10-CM | POA: Diagnosis not present

## 2023-01-28 DIAGNOSIS — Z6834 Body mass index (BMI) 34.0-34.9, adult: Secondary | ICD-10-CM | POA: Insufficient documentation

## 2023-01-28 DIAGNOSIS — Z6835 Body mass index (BMI) 35.0-35.9, adult: Secondary | ICD-10-CM | POA: Insufficient documentation

## 2023-01-28 DIAGNOSIS — E559 Vitamin D deficiency, unspecified: Secondary | ICD-10-CM

## 2023-01-28 DIAGNOSIS — Z6833 Body mass index (BMI) 33.0-33.9, adult: Secondary | ICD-10-CM | POA: Insufficient documentation

## 2023-01-28 MED ORDER — SEMAGLUTIDE-WEIGHT MANAGEMENT 1.7 MG/0.75ML ~~LOC~~ SOAJ
1.7000 mg | SUBCUTANEOUS | 0 refills | Status: DC
  Filled 2023-01-28: qty 9, 84d supply, fill #0
  Filled 2023-02-13 – 2023-02-15 (×2): qty 3, 28d supply, fill #0
  Filled 2023-03-10: qty 3, 28d supply, fill #1

## 2023-01-28 MED ORDER — ESCITALOPRAM OXALATE 20 MG PO TABS
20.0000 mg | ORAL_TABLET | Freq: Every day | ORAL | 0 refills | Status: DC
  Filled 2023-01-28 – 2023-03-04 (×2): qty 90, 90d supply, fill #0

## 2023-02-01 ENCOUNTER — Encounter: Payer: Self-pay | Admitting: Internal Medicine

## 2023-02-03 ENCOUNTER — Ambulatory Visit (INDEPENDENT_AMBULATORY_CARE_PROVIDER_SITE_OTHER): Payer: Commercial Managed Care - PPO | Admitting: Family Medicine

## 2023-02-09 NOTE — Progress Notes (Signed)
Chief Complaint:   OBESITY Yvonne [name] is here to discuss her progress with her obesity treatment plan along with follow-up of her obesity related diagnoses. Yvonne [name] is on following a lower carbohydrate, vegetable and lean protein rich diet plan and states she is following her eating plan approximately 90% of the time. Yvonne [name] states she is walking for 30 minutes 7 times per week.  Today's visit was #: 73 Starting weight: 204 lbs Starting date: 05/17/2017 Today's weight: 187 lbs Today's date: 01/28/2023 Total lbs lost to date: 17 Total lbs lost since last in-office visit: 0  Interim History: Yvonne [name] is doing well with maintaining her weight loss.  Her hunger is controlled, and she has no side effects on Wegovy.  Subjective:   1. Vitamin D deficiency Yvonne [name] is on a OTC vitamin D.  She is due for labs soon at her PCP.  2. Prediabetes Yvonne [name] is working on her diet and weight loss.  She is doing well on her GLP-1, but this will not be covered with her insurance soon.  3. Emotional Eating Behavior Yvonne [name] continues to do well with decreasing emotional eating behaviors on her Lexapro.  No side effects were noted.  Assessment/Plan:   1. Vitamin D deficiency Yvonne [name] will continue vitamin D 2000 units daily.  She is to ask her PCP to add vitamin D to her upcoming labs.  2. Prediabetes Yvonne [name] will continue GLP-1 as long as possible, and she will continue to work on her diet and exercise.  3. Emotional Eating Behavior Yvonne [name] will continue Lexapro, and we will refill for 90 days.  - escitalopram (LEXAPRO) 20 MG tablet; Take 1 tablet (20 mg total) by mouth daily.  Dispense: 90 tablet; Refill: 0  4. BMI 31.0-31.9,adult  5. Obesity, Beginning BMI 33.95 We will refill Wegovy 1.7 mg for 90 days.   - Semaglutide-Weight Management 1.7 MG/0.75ML SOAJ; Inject 1.7 mg into the skin once a week.  Dispense: 9 mL; Refill: 0  Yvonne [name] is currently in the action stage of change. As such, her goal is to  continue with weight loss efforts. She has agreed to following a lower carbohydrate, vegetable and lean protein rich diet plan.   Exercise goals: As is.   Behavioral modification strategies: increasing water intake.  Yvonne [name] has agreed to follow-up with our clinic in 6 weeks. She was informed of the importance of frequent follow-up visits to maximize her success with intensive lifestyle modifications for her multiple health conditions.   Objective:   Blood pressure 109/78, pulse 93, temperature 97.6 F (36.4 C), height '5\' 5"'$  (1.651 m), weight 187 lb (84.8 kg), last menstrual period 12/05/2021, SpO2 97 %. Body mass index is 31.12 kg/m.  Lab Results  Component Value Date   CREATININE 0.92 01/18/2023   BUN 15 01/18/2023   NA 140 01/18/2023   K 4.0 01/18/2023   CL 106 01/18/2023   CO2 25 01/18/2023   Lab Results  Component Value Date   ALT 10 01/18/2023   ALT 10 01/18/2023   AST 9 01/18/2023   AST 9 01/18/2023   ALKPHOS 146 (H) 01/18/2023   ALKPHOS 146 (H) 01/18/2023   BILITOT 0.5 01/18/2023   BILITOT 0.5 01/18/2023   Lab Results  Component Value Date   HGBA1C 5.7 01/18/2023   HGBA1C 5.8 (H) 06/11/2022   HGBA1C 5.7 (H) 11/26/2021   HGBA1C 5.8 (H) 10/07/2021   HGBA1C 6.1 (H) 01/08/2021   Lab Results  Component Value Date   INSULIN 48.7 (H) 06/11/2022  INSULIN 55.8 (H) 11/26/2021   INSULIN 49.9 (H) 10/07/2021   INSULIN 37.3 (H) 01/08/2021   INSULIN 33.2 (H) 11/09/2019   Lab Results  Component Value Date   TSH 1.070 06/11/2022   Lab Results  Component Value Date   CHOL 231 (H) 01/18/2023   HDL 81.40 01/18/2023   LDLCALC 131 (H) 01/18/2023   TRIG 91.0 01/18/2023   CHOLHDL 3 01/18/2023   Lab Results  Component Value Date   VD25OH 38.48 10/23/2022   VD25OH 33.6 06/11/2022   VD25OH 56.0 11/26/2021   Lab Results  Component Value Date   WBC 8.8 01/12/2022   HGB 12.1 01/12/2022   HCT 36.6 01/12/2022   MCV 94.6 01/12/2022   PLT 338.0 01/12/2022   Lab  Results  Component Value Date   IRON 77 01/12/2022   FERRITIN 131.2 01/12/2022   Attestation Statements:   Reviewed by clinician on day of visit: allergies, medications, problem list, medical history, surgical history, family history, social history, and previous encounter notes.   I, Trixie Dredge, am acting as transcriptionist for Dennard Nip, MD.  I have reviewed the above documentation for accuracy and completeness, and I agree with the above. -  Dennard Nip, MD

## 2023-02-13 ENCOUNTER — Other Ambulatory Visit (HOSPITAL_COMMUNITY): Payer: Self-pay

## 2023-02-15 ENCOUNTER — Other Ambulatory Visit (HOSPITAL_COMMUNITY): Payer: Self-pay

## 2023-02-17 ENCOUNTER — Other Ambulatory Visit (HOSPITAL_COMMUNITY): Payer: Self-pay

## 2023-02-23 ENCOUNTER — Encounter (INDEPENDENT_AMBULATORY_CARE_PROVIDER_SITE_OTHER): Payer: Self-pay | Admitting: Family Medicine

## 2023-03-01 ENCOUNTER — Telehealth: Payer: Self-pay | Admitting: Family

## 2023-03-01 ENCOUNTER — Other Ambulatory Visit (INDEPENDENT_AMBULATORY_CARE_PROVIDER_SITE_OTHER): Payer: Commercial Managed Care - PPO

## 2023-03-01 ENCOUNTER — Other Ambulatory Visit (HOSPITAL_COMMUNITY): Payer: Self-pay

## 2023-03-01 DIAGNOSIS — E785 Hyperlipidemia, unspecified: Secondary | ICD-10-CM | POA: Diagnosis not present

## 2023-03-01 LAB — LIPID PANEL
Cholesterol: 206 mg/dL — ABNORMAL HIGH (ref 0–200)
HDL: 80.5 mg/dL (ref >39.00)
LDL Cholesterol: 110 mg/dL — ABNORMAL HIGH (ref 0–99)
NonHDL: 125.62
Total CHOL/HDL Ratio: 3
Triglycerides: 76 mg/dL (ref 0.0–149.0)
VLDL: 15.2 mg/dL (ref 0.0–40.0)

## 2023-03-01 MED ORDER — ATORVASTATIN CALCIUM 20 MG PO TABS
20.0000 mg | ORAL_TABLET | Freq: Every day | ORAL | 1 refills | Status: DC
  Filled 2023-03-01: qty 90, 90d supply, fill #0
  Filled 2023-05-28: qty 90, 90d supply, fill #1

## 2023-03-01 NOTE — Telephone Encounter (Signed)
Cholesterol is improving but still a bit elevated. Please increase lipitor from 10mg  to 20mg  once daily.

## 2023-03-01 NOTE — Telephone Encounter (Signed)
Patient advised of results and new dose.

## 2023-03-02 ENCOUNTER — Other Ambulatory Visit: Payer: Commercial Managed Care - PPO

## 2023-03-04 ENCOUNTER — Other Ambulatory Visit (HOSPITAL_COMMUNITY): Payer: Self-pay

## 2023-03-10 ENCOUNTER — Other Ambulatory Visit (HOSPITAL_COMMUNITY): Payer: Self-pay

## 2023-03-11 ENCOUNTER — Ambulatory Visit (HOSPITAL_BASED_OUTPATIENT_CLINIC_OR_DEPARTMENT_OTHER): Payer: Commercial Managed Care - PPO | Admitting: Family

## 2023-03-17 ENCOUNTER — Other Ambulatory Visit (HOSPITAL_COMMUNITY): Payer: Self-pay

## 2023-03-17 ENCOUNTER — Telehealth (INDEPENDENT_AMBULATORY_CARE_PROVIDER_SITE_OTHER): Payer: Commercial Managed Care - PPO | Admitting: Family Medicine

## 2023-03-17 ENCOUNTER — Encounter (INDEPENDENT_AMBULATORY_CARE_PROVIDER_SITE_OTHER): Payer: Self-pay | Admitting: Family Medicine

## 2023-03-17 DIAGNOSIS — F3289 Other specified depressive episodes: Secondary | ICD-10-CM | POA: Diagnosis not present

## 2023-03-17 DIAGNOSIS — Z6831 Body mass index (BMI) 31.0-31.9, adult: Secondary | ICD-10-CM | POA: Diagnosis not present

## 2023-03-17 DIAGNOSIS — E669 Obesity, unspecified: Secondary | ICD-10-CM | POA: Diagnosis not present

## 2023-03-17 DIAGNOSIS — R7303 Prediabetes: Secondary | ICD-10-CM

## 2023-03-17 MED ORDER — SEMAGLUTIDE-WEIGHT MANAGEMENT 1.7 MG/0.75ML ~~LOC~~ SOAJ
1.7000 mg | SUBCUTANEOUS | 0 refills | Status: DC
  Filled 2023-03-17 – 2023-04-02 (×3): qty 3, 28d supply, fill #0

## 2023-03-17 MED ORDER — ESCITALOPRAM OXALATE 20 MG PO TABS
20.0000 mg | ORAL_TABLET | Freq: Every day | ORAL | 0 refills | Status: DC
  Filled 2023-03-17 – 2023-05-31 (×2): qty 90, 90d supply, fill #0

## 2023-03-17 NOTE — Progress Notes (Signed)
TeleHealth Visit:  Due to the COVID-19 pandemic, this visit was completed with telemedicine (audio/video) technology to reduce patient and provider exposure as well as to preserve personal protective equipment.   [name] has verbally consented to this TeleHealth visit. The patient is located at home, the provider is located at the [facility] office. The participants in this visit include the listed provider and patient. The visit was conducted today via MyChart video.   Chief Complaint: OBESITY Yvonne [name] is here to discuss her progress with her obesity treatment plan along with follow-up of her obesity related diagnoses. Yvonne [name] is on following a lower carbohydrate, vegetable and lean protein rich diet plan and states she is following her eating plan approximately 85% of the time. Yvonne [name] states she is walking for 30 minutes 7 times per week.  Today's visit was #: 82 Starting weight: 204 lbs Starting date: 05/17/2017  Interim History: Yvonne [name] has been maintaining her weight loss over the last 6 weeks.  She states her hunger is controlled and she is doing well with meeting her protein goals, but she feels she could do better with increasing her vegetables.  She feels this will improve this summer and she is working on this.  Subjective:   1. Pre-diabetes Yvonne [name] is working on decreasing simple carbohydrates to help prevent diabetes mellitus.  Her recent A1c was 5.7.  She is on a GLP-1 to help control the polyphagia seen with prediabetes mellitus.  Her insurance will not be covering this soon however.  2. Emotional Eating Behavior Yvonne [name] is working on decreasing emotional eating behavior.  She is doing well on Lexapro with no side effects noted.  Assessment/Plan:   1. Pre-diabetes We will refill Wegovy 1.7 mg once weekly for 1 month.  She is to use this for as long as possible, and we will discuss other options at her next visit.  2. Emotional Eating Behavior Che will  continue Lexapro 20 mg once daily, and we will refill for 90 days.  - escitalopram (LEXAPRO) 20 MG tablet; Take 1 tablet (20 mg total) by mouth daily.  Dispense: 90 tablet; Refill: 0  3. BMI 31.0-31.9,adult  4. Obesity, Beginning BMI 33.95 Yvonne [name] is currently in the action stage of change. As such, her goal is to continue with weight loss efforts. She has agreed to following a lower carbohydrate, vegetable and lean protein rich diet plan.   Exercise goals: As is.   Behavioral modification strategies: increasing vegetables and meal planning and cooking strategies.  Yvonne [name] has agreed to follow-up with our clinic in 4 weeks. She was informed of the importance of frequent follow-up visits to maximize her success with intensive lifestyle modifications for her multiple health conditions.  Objective:   VITALS: Per patient if applicable, see vitals. GENERAL: Alert and in no acute distress. CARDIOPULMONARY: No increased WOB. Speaking in clear sentences.  PSYCH: Pleasant and cooperative. Speech normal rate and rhythm. Affect is appropriate. Insight and judgement are appropriate. Attention is focused, linear, and appropriate.  NEURO: Oriented as arrived to appointment on time with no prompting.   Lab Results  Component Value Date   CREATININE 0.92 01/18/2023   BUN 15 01/18/2023   NA 140 01/18/2023   K 4.0 01/18/2023   CL 106 01/18/2023   CO2 25 01/18/2023   Lab Results  Component Value Date   ALT 10 01/18/2023   ALT 10 01/18/2023   AST 9 01/18/2023   AST 9 01/18/2023   ALKPHOS 146 (H) 01/18/2023  ALKPHOS 146 (H) 01/18/2023   BILITOT 0.5 01/18/2023   BILITOT 0.5 01/18/2023   Lab Results  Component Value Date   HGBA1C 5.7 01/18/2023   HGBA1C 5.8 (H) 06/11/2022   HGBA1C 5.7 (H) 11/26/2021   HGBA1C 5.8 (H) 10/07/2021   HGBA1C 6.1 (H) 01/08/2021   Lab Results  Component Value Date   INSULIN 48.7 (H) 06/11/2022   INSULIN 55.8 (H) 11/26/2021   INSULIN 49.9 (H) 10/07/2021    INSULIN 37.3 (H) 01/08/2021   INSULIN 33.2 (H) 11/09/2019   Lab Results  Component Value Date   TSH 1.070 06/11/2022   Lab Results  Component Value Date   CHOL 206 (H) 03/01/2023   HDL 80.50 03/01/2023   LDLCALC 110 (H) 03/01/2023   TRIG 76.0 03/01/2023   CHOLHDL 3 03/01/2023   Lab Results  Component Value Date   VD25OH 38.48 10/23/2022   VD25OH 33.6 06/11/2022   VD25OH 56.0 11/26/2021   Lab Results  Component Value Date   WBC 8.8 01/12/2022   HGB 12.1 01/12/2022   HCT 36.6 01/12/2022   MCV 94.6 01/12/2022   PLT 338.0 01/12/2022   Lab Results  Component Value Date   IRON 77 01/12/2022   FERRITIN 131.2 01/12/2022    Attestation Statements:   Reviewed by clinician on day of visit: allergies, medications, problem list, medical history, surgical history, family history, social history, and previous encounter notes.   I, Burt Knack, am acting as transcriptionist for Quillian Quince, MD.  I have reviewed the above documentation for accuracy and completeness, and I agree with the above. - Quillian Quince, MD

## 2023-03-18 ENCOUNTER — Other Ambulatory Visit (HOSPITAL_COMMUNITY): Payer: Self-pay

## 2023-03-31 ENCOUNTER — Other Ambulatory Visit (HOSPITAL_COMMUNITY): Payer: Self-pay

## 2023-04-02 ENCOUNTER — Other Ambulatory Visit (HOSPITAL_COMMUNITY): Payer: Self-pay

## 2023-04-02 LAB — HM DIABETES EYE EXAM

## 2023-04-13 ENCOUNTER — Other Ambulatory Visit (HOSPITAL_COMMUNITY): Payer: Self-pay

## 2023-04-21 ENCOUNTER — Other Ambulatory Visit (HOSPITAL_COMMUNITY): Payer: Self-pay

## 2023-05-12 ENCOUNTER — Ambulatory Visit (INDEPENDENT_AMBULATORY_CARE_PROVIDER_SITE_OTHER): Payer: Commercial Managed Care - PPO | Admitting: Family Medicine

## 2023-05-26 ENCOUNTER — Other Ambulatory Visit (HOSPITAL_COMMUNITY): Payer: Self-pay

## 2023-05-31 ENCOUNTER — Other Ambulatory Visit (HOSPITAL_COMMUNITY): Payer: Self-pay

## 2023-05-31 ENCOUNTER — Other Ambulatory Visit: Payer: Self-pay

## 2023-06-25 ENCOUNTER — Other Ambulatory Visit (HOSPITAL_COMMUNITY): Payer: Self-pay

## 2023-06-29 ENCOUNTER — Ambulatory Visit (INDEPENDENT_AMBULATORY_CARE_PROVIDER_SITE_OTHER): Payer: Commercial Managed Care - PPO | Admitting: Family Medicine

## 2023-06-29 ENCOUNTER — Encounter (INDEPENDENT_AMBULATORY_CARE_PROVIDER_SITE_OTHER): Payer: Self-pay | Admitting: Family Medicine

## 2023-06-29 ENCOUNTER — Other Ambulatory Visit (HOSPITAL_COMMUNITY): Payer: Self-pay

## 2023-06-29 VITALS — BP 125/83 | HR 85 | Temp 97.7°F | Ht 65.0 in | Wt 204.0 lb

## 2023-06-29 DIAGNOSIS — F4321 Adjustment disorder with depressed mood: Secondary | ICD-10-CM | POA: Diagnosis not present

## 2023-06-29 DIAGNOSIS — Z6833 Body mass index (BMI) 33.0-33.9, adult: Secondary | ICD-10-CM | POA: Diagnosis not present

## 2023-06-29 DIAGNOSIS — E559 Vitamin D deficiency, unspecified: Secondary | ICD-10-CM | POA: Diagnosis not present

## 2023-06-29 DIAGNOSIS — Z6834 Body mass index (BMI) 34.0-34.9, adult: Secondary | ICD-10-CM

## 2023-06-29 DIAGNOSIS — E7800 Pure hypercholesterolemia, unspecified: Secondary | ICD-10-CM

## 2023-06-29 DIAGNOSIS — F3289 Other specified depressive episodes: Secondary | ICD-10-CM | POA: Diagnosis not present

## 2023-06-29 DIAGNOSIS — R7303 Prediabetes: Secondary | ICD-10-CM

## 2023-06-29 DIAGNOSIS — E669 Obesity, unspecified: Secondary | ICD-10-CM

## 2023-06-29 MED ORDER — ESCITALOPRAM OXALATE 20 MG PO TABS
20.0000 mg | ORAL_TABLET | Freq: Every day | ORAL | 0 refills | Status: DC
  Filled 2023-06-29: qty 90, 90d supply, fill #0

## 2023-06-29 MED ORDER — METFORMIN HCL 500 MG PO TABS
500.0000 mg | ORAL_TABLET | Freq: Every day | ORAL | 0 refills | Status: DC
Start: 2023-06-29 — End: 2023-07-26
  Filled 2023-06-29: qty 30, 30d supply, fill #0

## 2023-06-30 LAB — CMP14+EGFR
ALT: 16 IU/L (ref 0–32)
AST: 16 IU/L (ref 0–40)
Albumin: 4.1 g/dL (ref 3.9–4.9)
Alkaline Phosphatase: 142 IU/L — ABNORMAL HIGH (ref 44–121)
BUN/Creatinine Ratio: 26 — ABNORMAL HIGH (ref 9–23)
BUN: 24 mg/dL (ref 6–24)
Bilirubin Total: <0.2 mg/dL (ref 0.0–1.2)
CO2: 20 mmol/L (ref 20–29)
Calcium: 9.9 mg/dL (ref 8.7–10.2)
Chloride: 108 mmol/L — ABNORMAL HIGH (ref 96–106)
Creatinine, Ser: 0.93 mg/dL (ref 0.57–1.00)
Globulin, Total: 2.6 g/dL (ref 1.5–4.5)
Glucose: 98 mg/dL (ref 70–99)
Potassium: 4.7 mmol/L (ref 3.5–5.2)
Sodium: 141 mmol/L (ref 134–144)
Total Protein: 6.7 g/dL (ref 6.0–8.5)
eGFR: 75 mL/min/{1.73_m2} (ref >59)

## 2023-06-30 LAB — CBC WITH DIFFERENTIAL/PLATELET
Basophils Absolute: 0.1 10*3/uL (ref 0.0–0.2)
Basos: 1 %
EOS (ABSOLUTE): 0.2 10*3/uL (ref 0.0–0.4)
Eos: 3 %
Hematocrit: 40.5 % (ref 34.0–46.6)
Hemoglobin: 13.7 g/dL (ref 11.1–15.9)
Immature Grans (Abs): 0 10*3/uL (ref 0.0–0.1)
Immature Granulocytes: 1 %
Lymphocytes Absolute: 3.1 10*3/uL (ref 0.7–3.1)
Lymphs: 38 %
MCH: 31.1 pg (ref 26.6–33.0)
MCHC: 33.8 g/dL (ref 31.5–35.7)
MCV: 92 fL (ref 79–97)
Monocytes Absolute: 0.6 10*3/uL (ref 0.1–0.9)
Monocytes: 7 %
Neutrophils Absolute: 4.2 10*3/uL (ref 1.4–7.0)
Neutrophils: 50 %
Platelets: 311 10*3/uL (ref 150–450)
RBC: 4.41 x10E6/uL (ref 3.77–5.28)
RDW: 13.9 % (ref 11.7–15.4)
WBC: 8.1 10*3/uL (ref 3.4–10.8)

## 2023-06-30 LAB — LIPID PANEL WITH LDL/HDL RATIO
Cholesterol, Total: 204 mg/dL — ABNORMAL HIGH (ref 100–199)
HDL: 82 mg/dL (ref >39)
LDL Chol Calc (NIH): 108 mg/dL — ABNORMAL HIGH (ref 0–99)
LDL/HDL Ratio: 1.3 ratio (ref 0.0–3.2)
Triglycerides: 81 mg/dL (ref 0–149)
VLDL Cholesterol Cal: 14 mg/dL (ref 5–40)

## 2023-06-30 LAB — VITAMIN D 25 HYDROXY (VIT D DEFICIENCY, FRACTURES): Vit D, 25-Hydroxy: 24.4 ng/mL — ABNORMAL LOW (ref 30.0–100.0)

## 2023-06-30 LAB — HEMOGLOBIN A1C
Est. average glucose Bld gHb Est-mCnc: 126 mg/dL
Hgb A1c MFr Bld: 6 % — ABNORMAL HIGH (ref 4.8–5.6)

## 2023-06-30 LAB — INSULIN, RANDOM: INSULIN: 51.4 u[IU]/mL — ABNORMAL HIGH (ref 2.6–24.9)

## 2023-06-30 LAB — TSH: TSH: 1.14 u[IU]/mL (ref 0.450–4.500)

## 2023-06-30 LAB — VITAMIN B12: Vitamin B-12: 499 pg/mL (ref 232–1245)

## 2023-07-06 ENCOUNTER — Other Ambulatory Visit (HOSPITAL_COMMUNITY): Payer: Self-pay

## 2023-07-07 ENCOUNTER — Encounter (INDEPENDENT_AMBULATORY_CARE_PROVIDER_SITE_OTHER): Payer: Self-pay

## 2023-07-16 ENCOUNTER — Other Ambulatory Visit: Payer: Self-pay | Admitting: Family

## 2023-07-16 ENCOUNTER — Other Ambulatory Visit (HOSPITAL_COMMUNITY): Payer: Self-pay

## 2023-07-16 DIAGNOSIS — I10 Essential (primary) hypertension: Secondary | ICD-10-CM

## 2023-07-16 MED ORDER — LOSARTAN POTASSIUM 25 MG PO TABS
25.0000 mg | ORAL_TABLET | Freq: Every day | ORAL | 1 refills | Status: DC
Start: 2023-07-16 — End: 2023-10-29
  Filled 2023-07-16: qty 90, 90d supply, fill #0
  Filled 2023-10-14: qty 90, 90d supply, fill #1

## 2023-07-19 ENCOUNTER — Ambulatory Visit: Payer: Commercial Managed Care - PPO | Admitting: Family

## 2023-07-23 ENCOUNTER — Other Ambulatory Visit (INDEPENDENT_AMBULATORY_CARE_PROVIDER_SITE_OTHER): Payer: Self-pay | Admitting: Family Medicine

## 2023-07-23 DIAGNOSIS — R7303 Prediabetes: Secondary | ICD-10-CM

## 2023-07-24 ENCOUNTER — Other Ambulatory Visit (HOSPITAL_COMMUNITY): Payer: Self-pay

## 2023-07-26 ENCOUNTER — Other Ambulatory Visit (HOSPITAL_COMMUNITY): Payer: Self-pay

## 2023-07-26 ENCOUNTER — Ambulatory Visit: Payer: Commercial Managed Care - PPO | Admitting: Family

## 2023-07-26 ENCOUNTER — Encounter: Payer: Self-pay | Admitting: Family

## 2023-07-26 VITALS — BP 121/74 | HR 81 | Temp 97.8°F | Resp 16 | Wt 213.0 lb

## 2023-07-26 DIAGNOSIS — E119 Type 2 diabetes mellitus without complications: Secondary | ICD-10-CM | POA: Insufficient documentation

## 2023-07-26 DIAGNOSIS — E782 Mixed hyperlipidemia: Secondary | ICD-10-CM | POA: Diagnosis not present

## 2023-07-26 DIAGNOSIS — Z7985 Long-term (current) use of injectable non-insulin antidiabetic drugs: Secondary | ICD-10-CM

## 2023-07-26 DIAGNOSIS — E1169 Type 2 diabetes mellitus with other specified complication: Secondary | ICD-10-CM | POA: Diagnosis not present

## 2023-07-26 DIAGNOSIS — I10 Essential (primary) hypertension: Secondary | ICD-10-CM | POA: Diagnosis not present

## 2023-07-26 DIAGNOSIS — F4321 Adjustment disorder with depressed mood: Secondary | ICD-10-CM | POA: Diagnosis not present

## 2023-07-26 MED ORDER — TIRZEPATIDE 2.5 MG/0.5ML ~~LOC~~ SOAJ
2.5000 mg | SUBCUTANEOUS | 0 refills | Status: DC
Start: 2023-07-26 — End: 2023-08-16
  Filled 2023-07-26: qty 2, 28d supply, fill #0

## 2023-07-26 NOTE — Progress Notes (Signed)
Subjective:     Patient ID: Yvonne [name], female    DOB: 04-Jul-1971, 52 y.o.   MRN: [id number]  Chief Complaint  Patient presents with   Hypertension    Here for follow up   Grief reaction    Patient reports her father passed away in 05/18/23.    Diabetes    Patient reports she discontinued Metformin due to side effects    HPI  Discussed the use of AI scribe software for clinical note transcription with the patient, who gave verbal consent to proceed.  History of Present Illness   The patient, with a history of Type 2 diabetes and obesity, presents for follow-up. She discontinued metformin due to diarrhea and is interested in resuming Ozempic, which she previously tolerated well. She also mentions that she had been on [facility] for weight loss, but their insurance no longer covers it. She is considering not continuing with her current provider, Dr. Dalbert Garnet, due to personal reasons, including the recent unexpected death of her father. This event has caused significant stress and disrupted her diet.  In the past, the patient has tried various weight loss medications, including Saxenda, Contrave, and Rebelsys, but experienced adverse effects with all except Ozempic and Wegovy. The only side effect she experienced with these two medications was constipation, which she managed by slowly increasing the dose over two months.  The patient also has a history of hypertension, which is well-controlled on losartan. Her cholesterol is slightly elevated despite being on Lipitor 20mg . She attributes this to not taking her medication consistently due to the stress of handling her father's estate.          Health Maintenance Due  Topic Date Due   COVID-19 Vaccine (4 - 2023-24 season) 08/07/2022   INFLUENZA VACCINE  07/08/2023    Past Medical History:  Diagnosis Date   Anemia    Anxiety    Arthritis    Borderline type 2 diabetes mellitus    High blood pressure    Joint pain    Kidney stones     Microscopic hematuria    Ovarian torsion 2022   Vitamin D deficiency     Past Surgical History:  Procedure Laterality Date   COLONOSCOPY  01/26/2023   LAPAROTOMY N/A 12/02/2021   Procedure: EXPLORATORY LAPAROTOMY,REMOVAL OF RIGHT TUBE AND OVARY;  Surgeon: Carver Fila, MD;  Location: [facility];  Service: Gynecology;  Laterality: N/A;    Family History  Problem Relation Age of Onset   Other Mother        deceased from multiple myelomas   Cancer Mother    Healthy Father    Congestive Heart Failure Father    Heart disease Father        CABG   Diabetes Father    Coronary artery disease Father    Cancer Maternal Grandmother    Multiple myeloma Maternal Grandmother    Cancer Maternal Grandfather        unsure what type of cancer   Diabetes Paternal Grandmother    Prostate cancer Paternal Grandmother    Colon cancer Neg Hx    Esophageal cancer Neg Hx    Liver cancer Neg Hx    Pancreatic cancer Neg Hx    Rectal cancer Neg Hx    Stomach cancer Neg Hx     Social History   Socioeconomic History   Marital status: Married    Spouse name: Not on file   Number of children: 3  Years of education: Not on file   Highest education level: Associate degree: academic program  Occupational History   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    Employer:   Tobacco Use   Smoking status: Never   Smokeless tobacco: Never  Vaping Use   Vaping status: Never Used  Substance and Sexual Activity   Alcohol use: No   Drug use: No   Sexual activity: Yes    Partners: Male    Birth control/protection: Post-menopausal  Other Topics Concern   Not on file  Social History Narrative   5 children (3 are out of the house) 3 biological and 2 step   Married to [name]   Works in patient Access at [name]   12/16/21 lives with husband one daughter at home who is 15 yr            Social Determinants of Health   Financial Resource Strain: Low Risk  (07/20/2023)   Overall  Financial Resource Strain (CARDIA)    Difficulty of Paying Living Expenses: Not hard at all  Food Insecurity: No Food Insecurity (07/20/2023)   Hunger Vital Sign    Worried About Running Out of Food in the Last Year: Never true    Ran Out of Food in the Last Year: Never true  Transportation Needs: No Transportation Needs (07/20/2023)   PRAPARE - Administrator, Civil Service (Medical): No    Lack of Transportation (Non-Medical): No  Physical Activity: Insufficiently Active (07/20/2023)   Exercise Vital Sign    Days of Exercise per Week: 3 days    Minutes of Exercise per Session: 30 min  Stress: Stress Concern Present (07/20/2023)   [name] of Occupational Health - Occupational Stress Questionnaire    Feeling of Stress : Very much  Social Connections: Moderately Integrated (07/20/2023)   Social Connection and Isolation Panel [[name]]    Frequency of Communication with Friends and Family: More than three times a week    Frequency of Social Gatherings with Friends and Family: Patient declined    Attends Religious Services: More than 4 times per year    Active Member of [name] or Organizations: No    Attends Engineer, structural: Not on file    Marital Status: Married  Catering manager Violence: Not on file    Outpatient Medications Prior to Visit  Medication Sig Dispense Refill   atorvastatin (LIPITOR) 20 MG tablet Take 1 tablet (20 mg total) by mouth daily. 90 tablet 1   Cholecalciferol (VITAMIN D) 50 MCG (2000 UT) tablet Take 2,000 Units by mouth daily.     cyanocobalamin (VITAMIN B12) 1000 MCG tablet Take 1 tablet (1,000 mcg total) by mouth daily.     escitalopram (LEXAPRO) 20 MG tablet Take 1 tablet (20 mg total) by mouth daily. 90 tablet 0   losartan (COZAAR) 25 MG tablet Take 1 tablet (25 mg total) by mouth daily. 90 tablet 1   metFORMIN (GLUCOPHAGE) 500 MG tablet Take 1 tablet (500 mg total) by mouth daily with breakfast. 30 tablet 0    Facility-Administered Medications Prior to Visit  Medication Dose Route Frequency Provider Last Rate Last Admin   0.9 %  sodium chloride infusion  500 mL Intravenous Once Pyrtle, Carie Caddy, MD        Allergies  Allergen Reactions   Red Blood Cells     Jehovah's Witness, does not want to receive blood    ROS See HPI    Objective:  Physical Exam Constitutional:      General: She is not in acute distress.    Appearance: Normal appearance. She is well-developed.  HENT:     Head: Normocephalic and atraumatic.     Right Ear: External ear normal.     Left Ear: External ear normal.  Eyes:     General: No scleral icterus. Neck:     Thyroid: No thyromegaly.  Cardiovascular:     Rate and Rhythm: Normal rate and regular rhythm.     Heart sounds: Normal heart sounds. No murmur heard. Pulmonary:     Effort: Pulmonary effort is normal. No respiratory distress.     Breath sounds: Normal breath sounds. No wheezing.  Musculoskeletal:     Cervical back: Neck supple.  Skin:    General: Skin is warm and dry.  Neurological:     Mental Status: She is alert and oriented to person, place, and time.  Psychiatric:        Mood and Affect: Mood normal.        Behavior: Behavior normal.        Thought Content: Thought content normal.        Judgment: Judgment normal.      BP 121/74 (BP Location: Right Arm, Patient Position: Sitting, Cuff Size: Large)   Pulse 81   Temp 97.8 F (36.6 C) (Oral)   Resp 16   Wt 213 lb (96.6 kg)   LMP 12/05/2021   SpO2 99%   BMI 35.45 kg/m  Wt Readings from Last 3 Encounters:  07/26/23 213 lb (96.6 kg)  06/29/23 204 lb (92.5 kg)  01/28/23 187 lb (84.8 kg)       Assessment & Plan:   Problem List Items Addressed This Visit       Unprioritized   Hypertension    BP Readings from Last 3 Encounters:  07/26/23 121/74  06/29/23 125/83  01/28/23 109/78    Blood pressure well-controlled on losartan. -Continue losartan.      Hyperlipidemia     Lab Results  Component Value Date   CHOL 204 (H) 06/29/2023   HDL 82 06/29/2023   LDLCALC 108 (H) 06/29/2023   TRIG 81 06/29/2023   CHOLHDL 3 03/01/2023   Slightly elevated cholesterol on Lipitor 20mg . Patient reports inconsistent medication adherence recently due to personal stressors. -Continue Lipitor 20mg . -Encourage consistent medication adherence.      GRIEF REACTION    She continues to grieve the loss of her father and to have stress regarding the settlement of his estate. She is trying to also focus on her health however.       Diabetes mellitus (HCC) - Primary    Off metformin due to GI side effects. Last A1c was good, but patient has been off metformin for two weeks. Patient expressed interest in resuming Ozempic or trying Mounjaro for glucose control and weight loss. -Attempt to obtain approval for [facility]. If not approved, consider Ozempic. -Check A1c in 3 months to assess glucose control off metformin.      Relevant Medications   tirzepatide [facility] 2.5 MG/0.5ML Pen    I have discontinued Jessamy D. Nishi's metFORMIN. I am also having her start on tirzepatide. Additionally, I am having her maintain her cyanocobalamin, Vitamin D, atorvastatin, escitalopram, and losartan. We will continue to administer sodium chloride.  Meds ordered this encounter  Medications   tirzepatide (MOUNJARO) 2.5 MG/0.5ML Pen    Sig: Inject 2.5 mg into the skin once a week.    Dispense:  2 mL    Refill:  0    Order Specific Question:   Supervising Provider    Answer:   Danise Edge A [id number]

## 2023-07-26 NOTE — Assessment & Plan Note (Addendum)
Lab Results  Component Value Date   CHOL 204 (H) 06/29/2023   HDL 82 06/29/2023   LDLCALC 108 (H) 06/29/2023   TRIG 81 06/29/2023   CHOLHDL 3 03/01/2023   Slightly elevated cholesterol on Lipitor 20mg . Patient reports inconsistent medication adherence recently due to personal stressors. -Continue Lipitor 20mg . -Encourage consistent medication adherence.

## 2023-07-26 NOTE — Patient Instructions (Signed)
VISIT SUMMARY:  During your recent visit, we discussed your Type 2 diabetes, high cholesterol, and high blood pressure. You mentioned that you stopped taking metformin due to side effects and are interested in resuming Ozempic or trying Mounjaro for glucose control and weight loss. You also mentioned that your cholesterol is slightly high despite taking Lipitor, which you attribute to inconsistent medication use due to personal stress. Your blood pressure is well-controlled with losartan.  YOUR PLAN:  -TYPE 2 DIABETES: Type 2 diabetes is a condition where your body doesn't use insulin properly, leading to high blood sugar levels. We will try to get approval for [facility], a medication that can help control your blood sugar levels and aid in weight loss. If we can't get approval, we'll consider resuming Ozempic, a medication you've tolerated well in the past. We'll check your A1c, a measure of your average blood sugar over the past 3 months, in 3 months to see how well your blood sugar is controlled without metformin.  -HIGH CHOLESTEROL: High cholesterol means you have too much of certain types of fat in your blood, which can increase your risk of heart disease. You should continue taking Lipitor, a medication that helps lower your cholesterol levels. It's important to take this medication consistently, even when you're dealing with personal stress.  -HIGH BLOOD PRESSURE: High blood pressure, or hypertension, is a condition where the force of blood against your artery walls is too high. Your blood pressure is well-controlled with losartan, a medication that helps relax and widen your blood vessels. You should continue taking this medication.  INSTRUCTIONS:  Please continue taking your current medications as prescribed. We will attempt to get approval for [facility] for your diabetes. If we can't get approval, we'll consider resuming Ozempic. We'll check your A1c in 3 months to see how well your blood sugar  is controlled without metformin. It's important to take your Lipitor consistently to help control your cholesterol levels. Your next follow-up appointment is in 3 months.

## 2023-07-26 NOTE — Assessment & Plan Note (Signed)
She continues to grieve the loss of her father and to have stress regarding the settlement of his estate. She is trying to also focus on her health however.

## 2023-07-26 NOTE — Assessment & Plan Note (Addendum)
BP Readings from Last 3 Encounters:  07/26/23 121/74  06/29/23 125/83  01/28/23 109/78    Blood pressure well-controlled on losartan. -Continue losartan.

## 2023-07-26 NOTE — Assessment & Plan Note (Signed)
Off metformin due to GI side effects. Last A1c was good, but patient has been off metformin for two weeks. Patient expressed interest in resuming Ozempic or trying Mounjaro for glucose control and weight loss. -Attempt to obtain approval for [facility]. If not approved, consider Ozempic. -Check A1c in 3 months to assess glucose control off metformin.

## 2023-07-28 NOTE — Progress Notes (Signed)
Chief Complaint:   OBESITY Yvonne [name] is here to discuss her progress with her obesity treatment plan along with follow-up of her obesity related diagnoses. Yvonne [name] is on following a lower carbohydrate, vegetable and lean protein rich diet plan and states she is following her eating plan approximately 0% of the time. Yvonne [name] states she is doing 0 minutes 0 times per week.  Today's visit was #: 83 Starting weight: 204 lbs Starting date: 05/17/2017 Today's weight: 204 lbs Today's date: 06/29/2023 Total lbs lost to date: 0 Total lbs lost since last in-office visit: 0  Interim History: Patient has been dealing with a lot of stressors and traveling.  She has not been able to concentrate on weight loss.  Subjective:   1. Grieving Patient's father died recently and she is the sole remaining family member.  She has been very stressed with dealing with his estate and funeral.  2. Prediabetes Patient is working on her diet to help prevent diabetes mellitus, and she is open to other medication options.  3. Vitamin D deficiency Patient is stable on vitamin D, and she is due for labs.  4. Pure hypercholesterolemia Patient is working on decreasing cholesterol in her diet.  She is on Lipitor with no side effects noted, and she is due for labs.  5. Emotional Eating Behavior Patient has not been concentrating on emotional eating behaviors.  She has no problems with Lexapro, and she needs a refill.  Assessment/Plan:   1. Grieving Patient was offered support and encouragement.  We will follow-up at her next visit in 1 month to see how she is doing.  2. Prediabetes We will check labs today.  Patient agreed to start metformin 500 mg every morning #30 with no refills.  - Vitamin B12 - CMP14+EGFR - Insulin, random - Hemoglobin A1c - CBC with Differential/Platelet  3. Vitamin D deficiency We will check labs today, and we will follow-up at patient's next visit in 1 month.  - VITAMIN D 25  Hydroxy (Vit-D Deficiency, Fractures)  4. Pure hypercholesterolemia We will check labs today, and we will follow-up at patient's next visit in 1 month. She will continue with her diet.   - Lipid Panel With LDL/HDL Ratio - TSH  5. Emotional Eating Behavior Patient will continue Lexapro 20 mg daily, and we will refill for 90 days.  - escitalopram (LEXAPRO) 20 MG tablet; Take 1 tablet (20 mg total) by mouth daily.  Dispense: 90 tablet; Refill: 0  6. BMI 34.0-34.9,adult  7. Obesity, Beginning BMI 33.95 Yvonne [name] is currently in the action stage of change. As such, her goal is to continue with weight loss efforts. She has agreed to practicing portion control and making smarter food choices, such as increasing vegetables and decreasing simple carbohydrates.   Exercise goals: All adults should avoid inactivity. Some physical activity is better than none, and adults who participate in any amount of physical activity gain some health benefits.  Behavioral modification strategies: increasing lean protein intake and no skipping meals.  Yvonne [name] has agreed to follow-up with our clinic in 4 weeks. She was informed of the importance of frequent follow-up visits to maximize her success with intensive lifestyle modifications for her multiple health conditions.   Objective:   Blood pressure 125/83, pulse 85, temperature 97.7 F (36.5 C), height 5\' 5"  (1.651 m), weight 204 lb (92.5 kg), last menstrual period 12/05/2021, SpO2 97%. Body mass index is 33.95 kg/m.  Lab Results  Component Value Date   CREATININE 0.93  06/29/2023   BUN 24 06/29/2023   NA 141 06/29/2023   K 4.7 06/29/2023   CL 108 (H) 06/29/2023   CO2 20 06/29/2023   Lab Results  Component Value Date   ALT 16 06/29/2023   AST 16 06/29/2023   ALKPHOS 142 (H) 06/29/2023   BILITOT <0.2 06/29/2023   Lab Results  Component Value Date   HGBA1C 6.0 (H) 06/29/2023   HGBA1C 5.7 01/18/2023   HGBA1C 5.8 (H) 06/11/2022   HGBA1C 5.7 (H)  11/26/2021   HGBA1C 5.8 (H) 10/07/2021   Lab Results  Component Value Date   INSULIN 51.4 (H) 06/29/2023   INSULIN 48.7 (H) 06/11/2022   INSULIN 55.8 (H) 11/26/2021   INSULIN 49.9 (H) 10/07/2021   INSULIN 37.3 (H) 01/08/2021   Lab Results  Component Value Date   TSH 1.140 06/29/2023   Lab Results  Component Value Date   CHOL 204 (H) 06/29/2023   HDL 82 06/29/2023   LDLCALC 108 (H) 06/29/2023   TRIG 81 06/29/2023   CHOLHDL 3 03/01/2023   Lab Results  Component Value Date   VD25OH 24.4 (L) 06/29/2023   VD25OH 38.48 10/23/2022   VD25OH 33.6 06/11/2022   Lab Results  Component Value Date   WBC 8.1 06/29/2023   HGB 13.7 06/29/2023   HCT 40.5 06/29/2023   MCV 92 06/29/2023   PLT 311 06/29/2023   Lab Results  Component Value Date   IRON 77 01/12/2022   FERRITIN 131.2 01/12/2022   Attestation Statements:   Reviewed by clinician on day of visit: allergies, medications, problem list, medical history, surgical history, family history, social history, and previous encounter notes.   I, Burt Knack, am acting as transcriptionist for Quillian Quince, MD.  I have reviewed the above documentation for accuracy and completeness, and I agree with the above. -  Quillian Quince, MD

## 2023-07-31 ENCOUNTER — Other Ambulatory Visit (HOSPITAL_COMMUNITY): Payer: Self-pay

## 2023-08-04 ENCOUNTER — Other Ambulatory Visit (HOSPITAL_COMMUNITY): Payer: Self-pay

## 2023-08-04 ENCOUNTER — Ambulatory Visit (INDEPENDENT_AMBULATORY_CARE_PROVIDER_SITE_OTHER): Payer: Commercial Managed Care - PPO | Admitting: Family Medicine

## 2023-08-04 ENCOUNTER — Encounter (INDEPENDENT_AMBULATORY_CARE_PROVIDER_SITE_OTHER): Payer: Self-pay | Admitting: Family Medicine

## 2023-08-04 VITALS — BP 105/74 | HR 86 | Temp 98.2°F | Ht 65.0 in | Wt 201.0 lb

## 2023-08-04 DIAGNOSIS — F3289 Other specified depressive episodes: Secondary | ICD-10-CM | POA: Diagnosis not present

## 2023-08-04 DIAGNOSIS — E1169 Type 2 diabetes mellitus with other specified complication: Secondary | ICD-10-CM

## 2023-08-04 DIAGNOSIS — Z7985 Long-term (current) use of injectable non-insulin antidiabetic drugs: Secondary | ICD-10-CM | POA: Diagnosis not present

## 2023-08-04 DIAGNOSIS — E669 Obesity, unspecified: Secondary | ICD-10-CM | POA: Diagnosis not present

## 2023-08-04 DIAGNOSIS — Z6833 Body mass index (BMI) 33.0-33.9, adult: Secondary | ICD-10-CM

## 2023-08-04 MED ORDER — ESCITALOPRAM OXALATE 20 MG PO TABS
20.0000 mg | ORAL_TABLET | Freq: Every day | ORAL | 0 refills | Status: DC
Start: 2023-08-04 — End: 2023-11-27
  Filled 2023-08-04 – 2023-08-18 (×2): qty 90, 90d supply, fill #0

## 2023-08-05 NOTE — Progress Notes (Signed)
Chief Complaint:   OBESITY Yvonne [name] is here to discuss her progress with her obesity treatment plan along with follow-up of her obesity related diagnoses. Yvonne [name] is on following a lower carbohydrate, vegetable and lean protein rich diet plan and states she is following her eating plan approximately 85% of the time. Yvonne [name] states she is walking for 30 minutes 5 times per week.  Today's visit was #: 84 Starting weight: 204 lbs Starting date: 05/17/2017 Today's weight: 201 lbs Today's date: 08/04/2023 Total lbs lost to date: 3 Total lbs lost since last in-office visit: 3  Interim History: Patient has started to work on her diet again.  She is doing well with her low carbohydrate plan overall.  She notes her hunger is better controlled.    Subjective:   1. Type 2 diabetes mellitus with other specified complication, without long-term current use of insulin (HCC) Patient recently started Mounjaro at 2.5 mg and she notes some decrease in polyphagia.  Her recent A1c was 6.0.  2. Emotional Eating Behavior Patient is doing well on Lexapro.  She is still grieving the loss of her father, but she feels she is functioning better.  Assessment/Plan:   1. Type 2 diabetes mellitus with other specified complication, without long-term current use of insulin (HCC) Patient will continue Mounjaro and her diet, and will be mindful to not increase her dose too quickly when she misses meals and decrease healthy nutrition.  2. Emotional Eating Behavior Patient will continue Lexapro 20 mg once daily, we will refill for 90 days.  - escitalopram (LEXAPRO) 20 MG tablet; Take 1 tablet (20 mg total) by mouth daily.  Dispense: 90 tablet; Refill: 0  3. BMI 33.0-33.9,adult  4. Obesity, Beginning BMI 33.95 Yvonne [name] is currently in the action stage of change. As such, her goal is to continue with weight loss efforts. She has agreed to following a lower carbohydrate, vegetable and lean protein rich diet plan.    Exercise goals: As is, increase strengthening.   Behavioral modification strategies: increasing lean protein intake.  Yvonne [name] has agreed to follow-up with our clinic in 8 weeks. She was informed of the importance of frequent follow-up visits to maximize her success with intensive lifestyle modifications for her multiple health conditions.   Objective:   Blood pressure 105/74, pulse 86, temperature 98.2 F (36.8 C), height 5\' 5"  (1.651 m), weight 201 lb (91.2 kg), last menstrual period 12/05/2021, SpO2 95%. Body mass index is 33.45 kg/m.  Lab Results  Component Value Date   CREATININE 0.93 06/29/2023   BUN 24 06/29/2023   NA 141 06/29/2023   K 4.7 06/29/2023   CL 108 (H) 06/29/2023   CO2 20 06/29/2023   Lab Results  Component Value Date   ALT 16 06/29/2023   AST 16 06/29/2023   ALKPHOS 142 (H) 06/29/2023   BILITOT <0.2 06/29/2023   Lab Results  Component Value Date   HGBA1C 6.0 (H) 06/29/2023   HGBA1C 5.7 01/18/2023   HGBA1C 5.8 (H) 06/11/2022   HGBA1C 5.7 (H) 11/26/2021   HGBA1C 5.8 (H) 10/07/2021   Lab Results  Component Value Date   INSULIN 51.4 (H) 06/29/2023   INSULIN 48.7 (H) 06/11/2022   INSULIN 55.8 (H) 11/26/2021   INSULIN 49.9 (H) 10/07/2021   INSULIN 37.3 (H) 01/08/2021   Lab Results  Component Value Date   TSH 1.140 06/29/2023   Lab Results  Component Value Date   CHOL 204 (H) 06/29/2023   HDL 82 06/29/2023  LDLCALC 108 (H) 06/29/2023   TRIG 81 06/29/2023   CHOLHDL 3 03/01/2023   Lab Results  Component Value Date   VD25OH 24.4 (L) 06/29/2023   VD25OH 38.48 10/23/2022   VD25OH 33.6 06/11/2022   Lab Results  Component Value Date   WBC 8.1 06/29/2023   HGB 13.7 06/29/2023   HCT 40.5 06/29/2023   MCV 92 06/29/2023   PLT 311 06/29/2023   Lab Results  Component Value Date   IRON 77 01/12/2022   FERRITIN 131.2 01/12/2022   Attestation Statements:   Reviewed by clinician on day of visit: allergies, medications, problem list, medical  history, surgical history, family history, social history, and previous encounter notes.   I, Burt Knack, am acting as transcriptionist for Quillian Quince, MD.  I have reviewed the above documentation for accuracy and completeness, and I agree with the above. -  Quillian Quince, MD

## 2023-08-16 ENCOUNTER — Other Ambulatory Visit (HOSPITAL_COMMUNITY): Payer: Self-pay

## 2023-08-16 ENCOUNTER — Telehealth: Payer: Self-pay | Admitting: Family

## 2023-08-16 MED ORDER — TIRZEPATIDE 5 MG/0.5ML ~~LOC~~ SOAJ
5.0000 mg | SUBCUTANEOUS | 0 refills | Status: DC
Start: 2023-08-16 — End: 2023-10-29
  Filled 2023-08-16: qty 6, 84d supply, fill #0
  Filled 2023-08-16: qty 2, 28d supply, fill #0
  Filled 2023-09-16: qty 2, 28d supply, fill #1
  Filled 2023-10-16: qty 2, 28d supply, fill #2

## 2023-08-16 NOTE — Addendum Note (Signed)
Addended by: Sandford Craze on: 08/16/2023 01:29 PM   Modules accepted: Orders

## 2023-08-16 NOTE — Telephone Encounter (Signed)
Patient said she took her last dose of mounjaro 2.5 mg today and needs a refill. She wasn't sure if PCP wanted to increase her to the next dose but if so, please send 90 day supply of the 5 mg to [facility]. She said she has had no issues so far and is doing great on it.

## 2023-08-18 ENCOUNTER — Telehealth: Payer: Self-pay | Admitting: Family

## 2023-08-18 ENCOUNTER — Other Ambulatory Visit (HOSPITAL_COMMUNITY): Payer: Self-pay

## 2023-08-18 NOTE — Telephone Encounter (Signed)
Pt called & stated that her pharmacy needs a prior auth from [facility] to refill the medication tirzepatide [facility]) 5 MG/0.5ML . Please call & advise pt.

## 2023-08-20 ENCOUNTER — Other Ambulatory Visit (HOSPITAL_COMMUNITY): Payer: Self-pay

## 2023-08-21 ENCOUNTER — Other Ambulatory Visit (HOSPITAL_COMMUNITY): Payer: Self-pay

## 2023-08-23 ENCOUNTER — Telehealth: Payer: Self-pay

## 2023-08-23 ENCOUNTER — Other Ambulatory Visit (HOSPITAL_COMMUNITY): Payer: Self-pay

## 2023-08-23 NOTE — Telephone Encounter (Signed)
Pharmacy Patient Advocate Encounter   Received notification from Pt Calls Messages that prior authorization for Yvonne [name] is required/requested.   Insurance verification completed.   The patient is insured through [facility] .   Per test claim: PA required; PA submitted to [name] via CoverMyMeds Key/confirmation #/EOC [id number] Status is pending

## 2023-08-23 NOTE — Telephone Encounter (Signed)
PA request has been Submitted. New Encounter created for follow up. For additional info see Pharmacy Prior Auth telephone encounter from 08/23/2023.

## 2023-08-25 ENCOUNTER — Other Ambulatory Visit (HOSPITAL_COMMUNITY): Payer: Self-pay

## 2023-08-26 ENCOUNTER — Other Ambulatory Visit (HOSPITAL_COMMUNITY): Payer: Self-pay

## 2023-08-26 ENCOUNTER — Other Ambulatory Visit: Payer: Self-pay | Admitting: Family

## 2023-08-26 MED ORDER — ATORVASTATIN CALCIUM 20 MG PO TABS
20.0000 mg | ORAL_TABLET | Freq: Every day | ORAL | 1 refills | Status: DC
  Filled 2023-08-26: qty 90, 90d supply, fill #0
  Filled 2023-11-24: qty 90, 90d supply, fill #1

## 2023-08-26 NOTE — Telephone Encounter (Signed)
/  Pharmacy Patient Advocate Encounter/  Received notification from [facility] that Prior Authorization for Yvonne [name] has been APPROVED from 08/25/2023 to 08/23/2024   PA #/Case ID/Reference #: [id number]

## 2023-09-01 ENCOUNTER — Other Ambulatory Visit (HOSPITAL_COMMUNITY): Payer: Self-pay

## 2023-09-03 ENCOUNTER — Other Ambulatory Visit (HOSPITAL_COMMUNITY): Payer: Self-pay

## 2023-09-07 ENCOUNTER — Other Ambulatory Visit (HOSPITAL_COMMUNITY): Payer: Self-pay

## 2023-09-07 MED ORDER — INFLUENZA VIRUS VACC SPLIT PF (FLUZONE) 0.5 ML IM SUSY
0.5000 mL | PREFILLED_SYRINGE | Freq: Once | INTRAMUSCULAR | 0 refills | Status: AC
  Filled 2023-09-07: qty 0.5, 1d supply, fill #0

## 2023-09-21 ENCOUNTER — Other Ambulatory Visit (HOSPITAL_COMMUNITY): Payer: Self-pay

## 2023-09-30 ENCOUNTER — Ambulatory Visit (INDEPENDENT_AMBULATORY_CARE_PROVIDER_SITE_OTHER): Payer: Commercial Managed Care - PPO | Admitting: Family Medicine

## 2023-10-01 ENCOUNTER — Other Ambulatory Visit (HOSPITAL_COMMUNITY): Payer: Self-pay

## 2023-10-12 ENCOUNTER — Other Ambulatory Visit (HOSPITAL_COMMUNITY): Payer: Self-pay

## 2023-10-23 ENCOUNTER — Other Ambulatory Visit (HOSPITAL_COMMUNITY): Payer: Self-pay

## 2023-10-26 ENCOUNTER — Other Ambulatory Visit (HOSPITAL_COMMUNITY): Payer: Self-pay

## 2023-10-26 ENCOUNTER — Ambulatory Visit: Payer: Commercial Managed Care - PPO | Admitting: Family

## 2023-10-27 ENCOUNTER — Ambulatory Visit: Payer: Commercial Managed Care - PPO | Admitting: Dermatology

## 2023-10-27 ENCOUNTER — Encounter: Payer: Self-pay | Admitting: Dermatology

## 2023-10-27 DIAGNOSIS — L700 Acne vulgaris: Secondary | ICD-10-CM

## 2023-10-27 MED ORDER — TRETINOIN 0.025 % EX CREA
TOPICAL_CREAM | Freq: Every day | CUTANEOUS | 4 refills | Status: AC
Start: 2023-10-27 — End: 2024-10-26
  Filled 2023-10-27: qty 45, 30d supply, fill #0
  Filled 2023-11-20 – 2023-11-22 (×2): qty 45, 30d supply, fill #1
  Filled 2023-12-27: qty 45, 30d supply, fill #2
  Filled 2024-02-21: qty 45, 30d supply, fill #3

## 2023-10-27 NOTE — Progress Notes (Signed)
   New Patient Visit   Subjective  Yvonne [name] is a 52 y.o. female who presents for the following: Facial Regimen  Patient states she has facial breakouts located at the face that she would like to have examined. Patient reports the areas have been there for several years. She reports the areas are bothersome. Patient rates irritation 1 out of 10. She states that the areas have spread. Patient reports she has not previously been treated for these areas.   The following portions of the chart were reviewed this encounter and updated as appropriate: medications, allergies, medical history  Review of Systems:  No other skin or systemic complaints except as noted in HPI or Assessment and Plan.  Objective  Well appearing patient in no apparent distress; mood and affect are within normal limits.  A focused examination was performed of the following areas: Face  Relevant exam findings are noted in the Assessment and Plan.         Assessment & Plan   ACNE VULGARIS Exam: Open comedones and inflammatory papules  Flared  Treatment Plan: - We will prescribe Tretinoin 0.025%, applied on M-W-F nights - Recommended Washing with La Roche Posay Gentle Cleanser - Recommended if she experiences excessive dryness - We will plan to follow up in 3 months    No follow-ups on file.  Documentation: I have reviewed the above documentation for accuracy and completeness, and I agree with the above.  Yvonne [name], am acting as scribe for Yvonne Reusing, DO.  Yvonne Reusing, DO

## 2023-10-27 NOTE — Patient Instructions (Addendum)
Hello Yvonne [name],  Thank you for visiting my office today. Your dedication to enhancing your health and addressing your dermatological needs is greatly appreciated. Below is a summary of the essential instructions we covered during our consultation:  - Skin Care Routine:   - Cleanser and Moisturizer: Begin using La Estate manager/land agent. Apply both products twice daily, once in the morning and once at night.   - Tretinoin Cream: Start applying tretinoin 0.025% cream three nights a week following your cleansing and moisturizing routine. If dryness occurs, reduce application to two nights a week. Ensure to use only a pea-sized amount for each application.  - Medication:   - Prescription: Tretinoin 0.025% cream has been prescribed for treating your acne and for anti-aging purposes. This prescription will be forwarded to your regular pharmacy.  - Hair Removal:   - Facial Hair Management: For managing facial hair, you may consider using a facial electric device for areas with more numerous hairs. Electrolysis is recommended as a long-term solution, although it might not be necessary for just a few hairs.  Please monitor how your skin reacts to the new regimen and medications. Should you have any questions or concerns, do not hesitate to reach out to my office.   Thank you once again for your visit today. I am looking forward to supporting you further on your journey towards improved skin health.  Warm regards,  Dr. Langston Reusing, Dermatologist       Important Information   Due to recent changes in healthcare laws, you may see results of your pathology and/or laboratory studies on MyChart before the doctors have had a chance to review them. We understand that in some cases there may be results that are confusing or concerning to you. Please understand that not all results are received at the same time and often the doctors may need to interpret multiple  results in order to provide you with the best plan of care or course of treatment. Therefore, we ask that you please give [place] 2 business days to thoroughly review all your results before contacting the office for clarification. Should we see a critical lab result, you will be contacted sooner.     If You Need Anything After Your Visit   If you have any questions or concerns for your doctor, please call our main line at [phone]. If no one answers, please leave a voicemail as directed and we will return your call as soon as possible. Messages left after 4 pm will be answered the following business day.    You may also send [place] a message via MyChart. We typically respond to MyChart messages within 1-2 business days.  For prescription refills, please ask your pharmacy to contact our office. Our fax number is [phone].  If you have an urgent issue when the clinic is closed that cannot wait until the next business day, you can page your doctor at the number below.     Please note that while we do our best to be available for urgent issues outside of office hours, we are not available 24/7.    If you have an urgent issue and are unable to reach [place], you may choose to seek medical care at your doctor's office, retail clinic, urgent care center, or emergency room.   If you have a medical emergency, please immediately call 911 or go to the emergency department. In the event of inclement weather, please call our main line at [phone]  for an update on the status of any delays or closures.  Dermatology Medication Tips: Please keep the boxes that topical medications come in in order to help keep track of the instructions about where and how to use these. Pharmacies typically print the medication instructions only on the boxes and not directly on the medication tubes.   If your medication is too expensive, please contact our office at [phone] or send [place] a message through MyChart.    We are  unable to tell what your co-pay for medications will be in advance as this is different depending on your insurance coverage. However, we may be able to find a substitute medication at lower cost or fill out paperwork to get insurance to cover a needed medication.    If a prior authorization is required to get your medication covered by your insurance company, please allow [place] 1-2 business days to complete this process.   Drug prices often vary depending on where the prescription is filled and some pharmacies may offer cheaper prices.   The website www.goodrx.com contains coupons for medications through different pharmacies. The prices here do not account for what the cost may be with help from insurance (it may be cheaper with your insurance), but the website can give you the price if you did not use any insurance.  - You can print the associated coupon and take it with your prescription to the pharmacy.  - You may also stop by our office during regular business hours and pick up a GoodRx coupon card.  - If you need your prescription sent electronically to a different pharmacy, notify our office through [facility] or by phone at [phone]

## 2023-10-28 ENCOUNTER — Other Ambulatory Visit (HOSPITAL_COMMUNITY): Payer: Self-pay

## 2023-10-29 ENCOUNTER — Other Ambulatory Visit (HOSPITAL_COMMUNITY): Payer: Self-pay

## 2023-10-29 ENCOUNTER — Telehealth: Payer: Self-pay | Admitting: Family

## 2023-10-29 ENCOUNTER — Ambulatory Visit: Payer: Commercial Managed Care - PPO | Admitting: Family

## 2023-10-29 VITALS — BP 115/88 | HR 93 | Temp 98.0°F | Resp 18 | Ht 65.0 in | Wt 206.0 lb

## 2023-10-29 DIAGNOSIS — I10 Essential (primary) hypertension: Secondary | ICD-10-CM

## 2023-10-29 DIAGNOSIS — E559 Vitamin D deficiency, unspecified: Secondary | ICD-10-CM | POA: Diagnosis not present

## 2023-10-29 DIAGNOSIS — E782 Mixed hyperlipidemia: Secondary | ICD-10-CM

## 2023-10-29 DIAGNOSIS — Z7985 Long-term (current) use of injectable non-insulin antidiabetic drugs: Secondary | ICD-10-CM

## 2023-10-29 DIAGNOSIS — Z6834 Body mass index (BMI) 34.0-34.9, adult: Secondary | ICD-10-CM | POA: Diagnosis not present

## 2023-10-29 DIAGNOSIS — E119 Type 2 diabetes mellitus without complications: Secondary | ICD-10-CM | POA: Diagnosis not present

## 2023-10-29 DIAGNOSIS — F3289 Other specified depressive episodes: Secondary | ICD-10-CM

## 2023-10-29 LAB — VITAMIN D 25 HYDROXY (VIT D DEFICIENCY, FRACTURES): VITD: 19.31 ng/mL — ABNORMAL LOW (ref 30.00–100.00)

## 2023-10-29 LAB — HEMOGLOBIN A1C: Hgb A1c MFr Bld: 6 % (ref 4.6–6.5)

## 2023-10-29 LAB — BASIC METABOLIC PANEL
BUN: 16 mg/dL (ref 6–23)
CO2: 28 meq/L (ref 19–32)
Calcium: 9.8 mg/dL (ref 8.4–10.5)
Chloride: 108 meq/L (ref 96–112)
Creatinine, Ser: 0.99 mg/dL (ref 0.40–1.20)
GFR: 66.2 mL/min (ref >60.00)
Glucose, Bld: 101 mg/dL — ABNORMAL HIGH (ref 70–99)
Potassium: 4.4 meq/L (ref 3.5–5.1)
Sodium: 144 meq/L (ref 135–145)

## 2023-10-29 MED ORDER — TIRZEPATIDE 7.5 MG/0.5ML ~~LOC~~ SOAJ
7.5000 mg | SUBCUTANEOUS | 2 refills | Status: DC
Start: 2023-10-29 — End: 2024-04-12
  Filled 2023-10-29: qty 2, 28d supply, fill #0
  Filled 2023-10-30 – 2023-11-01 (×2): qty 6, 84d supply, fill #0
  Filled 2023-11-01: qty 2, 28d supply, fill #0
  Filled 2023-11-01: qty 6, 84d supply, fill #0
  Filled 2023-11-01 – 2023-11-02 (×2): qty 2, 28d supply, fill #0
  Filled 2023-11-03 – 2023-11-05 (×2): qty 6, 84d supply, fill #0
  Filled 2023-11-08: qty 2, 28d supply, fill #0
  Filled 2023-12-02: qty 2, 28d supply, fill #1
  Filled 2023-12-27: qty 2, 28d supply, fill #2
  Filled 2024-01-26: qty 2, 28d supply, fill #3
  Filled 2024-02-21: qty 2, 28d supply, fill #4
  Filled 2024-03-14 – 2024-03-15 (×3): qty 2, 28d supply, fill #5

## 2023-10-29 MED ORDER — LOSARTAN POTASSIUM 25 MG PO TABS
25.0000 mg | ORAL_TABLET | Freq: Every day | ORAL | 1 refills | Status: DC
Start: 2023-10-29 — End: 2024-01-04
  Filled 2023-10-29: qty 90, 90d supply, fill #0

## 2023-10-29 MED ORDER — VITAMIN D (ERGOCALCIFEROL) 1.25 MG (50000 UNIT) PO CAPS
50000.0000 [IU] | ORAL_CAPSULE | ORAL | 0 refills | Status: DC
  Filled 2023-10-29 – 2023-10-30 (×2): qty 12, 84d supply, fill #0

## 2023-10-29 NOTE — Progress Notes (Signed)
Subjective:     Patient ID: Yvonne [name], female    DOB: 1971-01-25, 52 y.o.   MRN: [id number]  Chief Complaint  Patient presents with   Follow-up    3 month     HPI  Discussed the use of AI scribe software for clinical note transcription with the patient, who gave verbal consent to proceed.  History of Present Illness   The patient presents today for routine follow up.  She continues losartan for HTN. . She reports no issues with her mood and has been taking Lexapro, which was initially prescribed to help with food cravings by her weight loss provider. The patient notes that her emotional state has improved significantly since the passing of her father and the resolution of related estate matters.  The patient has been taking Mounjaro for weight loss and reports that it has been effective. However, she notes that her weight has been fluctuating recently due to a recent move and the associated changes in diet. She has been consuming more fast food due to issues with her stove not working in her new home. The patient reports no side effects from the [facility].     Lab Results  Component Value Date   CHOL 204 (H) 06/29/2023   HDL 82 06/29/2023   LDLCALC 108 (H) 06/29/2023   TRIG 81 06/29/2023   CHOLHDL 3 03/01/2023        Health Maintenance Due  Topic Date Due   COVID-19 Vaccine (4 - 2023-24 season) 08/08/2023    Past Medical History:  Diagnosis Date   Anemia    Anxiety    Arthritis    Borderline type 2 diabetes mellitus    High blood pressure    Joint pain    Kidney stones    Microscopic hematuria    Ovarian torsion 2022   Vitamin D deficiency     Past Surgical History:  Procedure Laterality Date   COLONOSCOPY  01/26/2023   LAPAROTOMY N/A 12/02/2021   Procedure: EXPLORATORY LAPAROTOMY,REMOVAL OF RIGHT TUBE AND OVARY;  Surgeon: Carver Fila, MD;  Location: [facility];  Service: Gynecology;  Laterality: N/A;    Family History  Problem Relation Age of  Onset   Other Mother        deceased from multiple myelomas   Cancer Mother    Healthy Father    Congestive Heart Failure Father    Heart disease Father        CABG   Diabetes Father    Coronary artery disease Father    Cancer Maternal Grandmother    Multiple myeloma Maternal Grandmother    Cancer Maternal Grandfather        unsure what type of cancer   Diabetes Paternal Grandmother    Prostate cancer Paternal Grandmother    Colon cancer Neg Hx    Esophageal cancer Neg Hx    Liver cancer Neg Hx    Pancreatic cancer Neg Hx    Rectal cancer Neg Hx    Stomach cancer Neg Hx     Social History   Socioeconomic History   Marital status: Married    Spouse name: Not on file   Number of children: 3   Years of education: Not on file   Highest education level: Associate degree: academic program  Occupational History   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    Employer: [name]  Tobacco Use   Smoking status: Never   Smokeless tobacco: Never  Vaping Use   Vaping  status: Never Used  Substance and Sexual Activity   Alcohol use: No   Drug use: No   Sexual activity: Yes    Partners: Male    Birth control/protection: Post-menopausal  Other Topics Concern   Not on file  Social History Narrative   5 children (3 are out of the house) 3 biological and 2 step   Married to [name]   Works in patient Access at [name]   12/16/21 lives with husband one daughter at home who is 15 yr            Social Determinants of Health   Financial Resource Strain: Low Risk  (07/20/2023)   Overall Financial Resource Strain (CARDIA)    Difficulty of Paying Living Expenses: Not hard at all  Food Insecurity: No Food Insecurity (07/20/2023)   Hunger Vital Sign    Worried About Running Out of Food in the Last Year: Never true    Ran Out of Food in the Last Year: Never true  Transportation Needs: No Transportation Needs (07/20/2023)   PRAPARE - Administrator, Civil Service  (Medical): No    Lack of Transportation (Non-Medical): No  Physical Activity: Insufficiently Active (07/20/2023)   Exercise Vital Sign    Days of Exercise per Week: 3 days    Minutes of Exercise per Session: 30 min  Stress: Stress Concern Present (07/20/2023)   [name] of Occupational Health - Occupational Stress Questionnaire    Feeling of Stress : Very much  Social Connections: Moderately Integrated (07/20/2023)   Social Connection and Isolation Panel [[name]]    Frequency of Communication with Friends and Family: More than three times a week    Frequency of Social Gatherings with Friends and Family: Patient declined    Attends Religious Services: More than 4 times per year    Active Member of [name] or Organizations: No    Attends Engineer, structural: Not on file    Marital Status: Married  Catering manager Violence: Not on file    Outpatient Medications Prior to Visit  Medication Sig Dispense Refill   atorvastatin (LIPITOR) 20 MG tablet Take 1 tablet (20 mg total) by mouth daily. 90 tablet 1   Cholecalciferol (VITAMIN D) 50 MCG (2000 UT) tablet Take 2,000 Units by mouth daily.     cyanocobalamin (VITAMIN B12) 1000 MCG tablet Take 1 tablet (1,000 mcg total) by mouth daily.     escitalopram (LEXAPRO) 20 MG tablet Take 1 tablet (20 mg total) by mouth daily. 90 tablet 0   tretinoin (RETIN-A) 0.025 % cream Apply topically at bedtime. Apply on M-W-F 45 g 4   losartan (COZAAR) 25 MG tablet Take 1 tablet (25 mg total) by mouth daily. 90 tablet 1   tirzepatide (MOUNJARO) 5 MG/0.5ML Pen Inject 5 mg into the skin once a week. 6 mL 0   No facility-administered medications prior to visit.    Allergies  Allergen Reactions   Red Blood Cells     Jehovah's Witness, does not want to receive blood    ROS See HPI    Objective:    Physical Exam Constitutional:      General: She is not in acute distress.    Appearance: Normal appearance. She is well-developed.  HENT:      Head: Normocephalic and atraumatic.     Right Ear: External ear normal.     Left Ear: External ear normal.  Eyes:     General: No scleral  icterus. Neck:     Thyroid: No thyromegaly.  Cardiovascular:     Rate and Rhythm: Normal rate and regular rhythm.     Heart sounds: Normal heart sounds. No murmur heard. Pulmonary:     Effort: Pulmonary effort is normal. No respiratory distress.     Breath sounds: Normal breath sounds. No wheezing.  Musculoskeletal:     Cervical back: Neck supple.  Skin:    General: Skin is warm and dry.  Neurological:     Mental Status: She is alert and oriented to person, place, and time.  Psychiatric:        Mood and Affect: Mood normal.        Behavior: Behavior normal.        Thought Content: Thought content normal.        Judgment: Judgment normal.      BP 115/88   Pulse 93   Temp 98 F (36.7 C)   Resp 18   Ht 5\' 5"  (1.651 m)   Wt 206 lb (93.4 kg)   LMP 12/05/2021   SpO2 97%   BMI 34.28 kg/m  Wt Readings from Last 3 Encounters:  10/29/23 206 lb (93.4 kg)  08/04/23 201 lb (91.2 kg)  07/26/23 213 lb (96.6 kg)       Assessment & Plan:   Problem List Items Addressed This Visit       Unprioritized   Vitamin D deficiency    She is currently taking 2000 international units daily OTC. Will update level.       Relevant Orders   Vitamin D (25 hydroxy)   Hypertension - Primary    BP Readings from Last 3 Encounters:  10/29/23 115/88  08/04/23 105/74  07/26/23 121/74   BP is at goal. Continue current dose of losartan.       Relevant Medications   losartan (COZAAR) 25 MG tablet   Other Relevant Orders   Basic Metabolic Panel (BMET)   Hyperlipidemia    Maintained on atorvastatin. Last lipid panel was nearly at goal. Continue to work on diet/weight loss.       Relevant Medications   losartan (COZAAR) 25 MG tablet   Diabetes mellitus (HCC)    Lab Results  Component Value Date   HGBA1C 6.0 (H) 06/29/2023   HGBA1C 5.7 01/18/2023    HGBA1C 5.8 (H) 06/11/2022   Lab Results  Component Value Date   MICROALBUR 1.3 01/18/2023   LDLCALC 108 (H) 06/29/2023   CREATININE 0.93 06/29/2023   Wt Readings from Last 3 Encounters:  10/29/23 206 lb (93.4 kg)  08/04/23 201 lb (91.2 kg)  07/26/23 213 lb (96.6 kg)         Relevant Medications   tirzepatide (MOUNJARO) 7.5 MG/0.5ML Pen   losartan (COZAAR) 25 MG tablet   Other Relevant Orders   HgB A1c   Depression    Reports that her mood is stable.  She feels that this helps with her food cravings.        BMI 34.0-34.9,adult    Will increase Mounjaro from 5mg  to 7.5mg  once daily.        I have discontinued Payzlee D. Barsch's tirzepatide. I am also having her start on tirzepatide. Additionally, I am having her maintain her cyanocobalamin, Vitamin D, escitalopram, atorvastatin, tretinoin, and losartan.  Meds ordered this encounter  Medications   tirzepatide (MOUNJARO) 7.5 MG/0.5ML Pen    Sig: Inject 7.5 mg into the skin once a week.    Dispense:  6 mL    Refill:  2    Order Specific Question:   Supervising Provider    Answer:   Danise Edge A [[id number]]   losartan (COZAAR) 25 MG tablet    Sig: Take 1 tablet (25 mg total) by mouth daily.    Dispense:  90 tablet    Refill:  1    Order Specific Question:   Supervising Provider    Answer:   Danise Edge A [[id number]]

## 2023-10-29 NOTE — Assessment & Plan Note (Signed)
She is currently taking 2000 international units daily OTC. Will update level.

## 2023-10-29 NOTE — Patient Instructions (Signed)
VISIT SUMMARY:  During today's visit, we discussed your elevated blood pressure, which may have been influenced by rushing to the appointment. We also reviewed your current medications and their effectiveness, including Lexapro for mood stabilization and Mounjaro/sugar management. Additionally, we addressed your ongoing management of hyperlipidemia and vitamin supplementation.  YOUR PLAN:  -HYPERTENSION: Hypertension means high blood pressure. Your blood pressure was elevated upon arrival, but improved later in the visit. Continue your current dose of losartan.   -DEPRESSION/ANXIETY: Your mood has been stable on Lexapro, which you are taking to help with food cravings. You have experienced significant emotional improvement since resolving recent stressors. Continue taking Lexapro as prescribed.  -OBESITY: Obesity refers to having an excessive amount of body fat. You have experienced some weight loss with Mounjaro, but your weight has plateaued recently. We will increase your Mounjaro dose to 7.5mg . We will also check your A1C and kidney function today, and plan for a weight loss check-in in 4 months.  -HYPERLIPIDEMIA: Hyperlipidemia means having high levels of fats in your blood. You are currently taking Lipitor, and your last refill is due in December. Continue taking Lipitor as prescribed.  -VITAMIN D AND B12 SUPPLEMENTATION: You are taking over-the-counter Vitamin D and B12 supplements. We will check your Vitamin D level today. Continue taking the supplements as you have been doing.  INSTRUCTIONS:  We will recheck your blood pressure later in the visit. Please continue taking your medications as prescribed. We will also check your A1C, kidney function, and Vitamin D levels today. Plan for a weight loss check-in in 4 months.

## 2023-10-29 NOTE — Assessment & Plan Note (Signed)
Reports that her mood is stable.  She feels that this helps with her food cravings.

## 2023-10-29 NOTE — Assessment & Plan Note (Signed)
Lab Results  Component Value Date   HGBA1C 6.0 (H) 06/29/2023   HGBA1C 5.7 01/18/2023   HGBA1C 5.8 (H) 06/11/2022   Lab Results  Component Value Date   MICROALBUR 1.3 01/18/2023   LDLCALC 108 (H) 06/29/2023   CREATININE 0.93 06/29/2023   Wt Readings from Last 3 Encounters:  10/29/23 206 lb (93.4 kg)  08/04/23 201 lb (91.2 kg)  07/26/23 213 lb (96.6 kg)

## 2023-10-29 NOTE — Telephone Encounter (Signed)
Vitamin D level is low.  Advise patient to begin vit D 50000 units once weekly for 12 weeks, then repeat vit D level (dx Vit D deficiency).    A1C at goal, kidney function is normal.

## 2023-10-29 NOTE — Assessment & Plan Note (Signed)
Maintained on atorvastatin. Last lipid panel was nearly at goal. Continue to work on diet/weight loss.

## 2023-10-29 NOTE — Assessment & Plan Note (Addendum)
BP Readings from Last 3 Encounters:  10/29/23 115/88  08/04/23 105/74  07/26/23 121/74   BP is at goal. Continue current dose of losartan.

## 2023-10-29 NOTE — Assessment & Plan Note (Signed)
Will increase Mounjaro from 5mg  to 7.5mg  once daily.

## 2023-10-30 ENCOUNTER — Other Ambulatory Visit (HOSPITAL_COMMUNITY): Payer: Self-pay

## 2023-11-01 ENCOUNTER — Other Ambulatory Visit: Payer: Self-pay

## 2023-11-01 ENCOUNTER — Other Ambulatory Visit (HOSPITAL_COMMUNITY): Payer: Self-pay

## 2023-11-01 ENCOUNTER — Telehealth: Payer: Self-pay | Admitting: Family

## 2023-11-01 NOTE — Telephone Encounter (Signed)
PA initiated via Covermymeds; KEY: [id number]. Awaiting determination.

## 2023-11-01 NOTE — Telephone Encounter (Signed)
Pt states she needs a PA for tirzepatide (MOUNJARO) 7.5 MG/0.5ML Pen. Also wanted to get a PA done for al doses of mounjaro and she runs into this issue every time she ups the dose.

## 2023-11-01 NOTE — Telephone Encounter (Signed)
Patient notified of results and new medication. She will schedule lab appointment later on

## 2023-11-01 NOTE — Telephone Encounter (Signed)
PA approved.   The authorization is effective from 11/01/2023 to 10/31/2024, as long as you are enrolled as  a member of your current health plan. The request was approved with a quantity restriction.

## 2023-11-02 ENCOUNTER — Other Ambulatory Visit: Payer: Self-pay

## 2023-11-03 ENCOUNTER — Other Ambulatory Visit (HOSPITAL_COMMUNITY): Payer: Self-pay

## 2023-11-03 ENCOUNTER — Other Ambulatory Visit: Payer: Self-pay

## 2023-11-05 ENCOUNTER — Other Ambulatory Visit (HOSPITAL_COMMUNITY): Payer: Self-pay

## 2023-11-08 ENCOUNTER — Other Ambulatory Visit (HOSPITAL_COMMUNITY): Payer: Self-pay

## 2023-11-10 ENCOUNTER — Other Ambulatory Visit: Payer: Self-pay

## 2023-11-10 ENCOUNTER — Emergency Department (HOSPITAL_BASED_OUTPATIENT_CLINIC_OR_DEPARTMENT_OTHER)
Admission: EM | Admit: 2023-11-10 | Discharge: 2023-11-11 | Disposition: A | Discharge status: Home / Self Care | Payer: Commercial Managed Care - PPO | Attending: Emergency Medicine | Admitting: Emergency Medicine

## 2023-11-10 ENCOUNTER — Encounter (HOSPITAL_BASED_OUTPATIENT_CLINIC_OR_DEPARTMENT_OTHER): Payer: Self-pay | Admitting: Emergency Medicine

## 2023-11-10 DIAGNOSIS — W268XXA Contact with other sharp object(s), not elsewhere classified, initial encounter: Secondary | ICD-10-CM | POA: Insufficient documentation

## 2023-11-10 DIAGNOSIS — Z23 Encounter for immunization: Secondary | ICD-10-CM | POA: Insufficient documentation

## 2023-11-10 DIAGNOSIS — S61011A Laceration without foreign body of right thumb without damage to nail, initial encounter: Secondary | ICD-10-CM | POA: Insufficient documentation

## 2023-11-10 DIAGNOSIS — S6991XA Unspecified injury of right wrist, hand and finger(s), initial encounter: Secondary | ICD-10-CM | POA: Diagnosis present

## 2023-11-10 NOTE — ED Triage Notes (Signed)
Cut right thumb reaching into garbage on metal can. 1cm lac. Rinsed with tap water. Dressed with clean gauze and coban intriage- controlled. Tetanus unknown.

## 2023-11-10 NOTE — ED Notes (Signed)
ED Provider at bedside. 

## 2023-11-11 MED ORDER — LIDOCAINE HCL (PF) 1 % IJ SOLN
5.0000 mL | Freq: Once | INTRAMUSCULAR | Status: AC
  Administered 2023-11-11: 5 mL
  Filled 2023-11-11: qty 5

## 2023-11-11 MED ORDER — TETANUS-DIPHTH-ACELL PERTUSSIS 5-2.5-18.5 LF-MCG/0.5 IM SUSY
0.5000 mL | PREFILLED_SYRINGE | Freq: Once | INTRAMUSCULAR | Status: AC
  Administered 2023-11-11: 0.5 mL via INTRAMUSCULAR
  Filled 2023-11-11: qty 0.5

## 2023-11-11 NOTE — ED Provider Notes (Signed)
[place] [facility]ERGENC[facility] DEPARTMENT AT [facility]  Provider Note  CSN: [id number] Arrival date & time: 11/10/23 2137  History Chief Complaint  Patient presents with   Laceration    Yvonne [name] is a 52 y.o. female reports she cut her R thumb on something sharp in the trashcan earlier tonight. Could not get bleeding stopped so came to the ED. TDAP unknown.    Home Medications Prior to Admission medications   Medication Sig Start Date End Date Taking? Authorizing Provider  atorvastatin (LIPITOR) 20 MG tablet Take 1 tablet (20 mg total) by mouth daily. 08/26/23   Sandford Craze, NP  Cholecalciferol (VITAMIN D) 50 MCG (2000 UT) tablet Take 2,000 Units by mouth daily.    [provider]  cyanocobalamin (VITAMIN B12) 1000 MCG tablet Take 1 tablet (1,000 mcg total) by mouth daily. 10/27/22   Sandford Craze, NP  escitalopram (LEXAPRO) 20 MG tablet Take 1 tablet (20 mg total) by mouth daily. 08/04/23   Quillian Quince D, MD  losartan (COZAAR) 25 MG tablet Take 1 tablet (25 mg total) by mouth daily. 10/29/23 04/26/24  Sandford Craze, NP  tirzepatide [facility]) 7.5 MG/0.5ML Pen Inject 7.5 mg into the skin once a week. 10/29/23   Sandford Craze, NP  tretinoin (RETIN-A) 0.025 % cream Apply topically at bedtime. Apply on M-W-F 10/27/23 10/26/24  Terri Piedra, DO  Vitamin D, Ergocalciferol, (DRISDOL) 1.25 MG (50000 UNIT) CAPS capsule Take 1 capsule (50,000 Units total) by mouth every 7 (seven) days. 10/29/23   Sandford Craze, NP     Allergies    Red blood cells   Review of Systems   Review of Systems Please see HPI for pertinent positives and negatives  Physical Exam BP (!) 147/94   Pulse 80   Temp (!) 97.5 F (36.4 C) (Oral)   Resp 20   LMP 12/05/2021   SpO2 100%   Physical Exam Vitals and nursing note reviewed.  HENT:     Head: Normocephalic.     Nose: Nose normal.  Eyes:     Extraocular Movements: Extraocular movements intact.   Pulmonary:     Effort: Pulmonary effort is normal.  Musculoskeletal:        General: Normal range of motion.     Cervical back: Neck supple.  Skin:    Findings: No rash (on exposed skin).     Comments: 1.5cm laceration to ulnar aspect of distal R thumb, does not include the nail  Neurological:     Mental Status: She is alert and oriented to person, place, and time.  Psychiatric:        Mood and Affect: Mood normal.     ED Results / Procedures / Treatments   EKG None  Procedures .Laceration Repair  Date/Time: 11/11/2023 1:08 AM  Performed by: Pollyann Savoy, MD Authorized by: Pollyann Savoy, MD   Consent:    Consent obtained:  Verbal   Consent given by:  Patient Anesthesia:    Anesthesia method:  Nerve block and local infiltration   Block anesthetic:  Lidocaine 1% w/o epi   Block technique:  Digital block Laceration details:    Location:  Finger   Finger location:  R thumb   Length (cm):  1.5 Pre-procedure details:    Preparation:  Patient was prepped and draped in usual sterile fashion Exploration:    Hemostasis achieved with:  Tourniquet   Imaging outcome: foreign body not noted     Wound exploration: wound explored through  full range of motion   Treatment:    Area cleansed with:  Povidone-iodine   Irrigation solution:  Sterile saline   Irrigation method:  Syringe Skin repair:    Repair method:  Sutures   Suture size:  4-0 and 5-0   Suture material:  Prolene   Suture technique:  Simple interrupted   Number of sutures:  4 Approximation:    Approximation:  Close Repair type:    Repair type:  Simple Post-procedure details:    Dressing:  Non-adherent dressing   Procedure completion:  Tolerated well, no immediate complications   Medications Ordered in the ED Medications  lidocaine (PF) (XYLOCAINE) 1 % injection 5 mL (5 mLs Infiltration Given 11/11/23 0013)  Tdap (BOOSTRIX) injection 0.5 mL (0.5 mLs Intramuscular Given 11/11/23 0010)    Initial  Impression and Plan  Patient with thumb laceration, repaired as above. TDAP updated. Wound care instructions given. Suture removal in 7-10 days.   ED Course       MDM Rules/Calculators/A&P Medical Decision Making Problems Addressed: Laceration of right thumb without foreign body without damage to nail, initial encounter: acute illness or injury  Risk Prescription drug management.     Final Clinical Impression(s) / ED Diagnoses Final diagnoses:  Laceration of right thumb without foreign body without damage to nail, initial encounter    Rx / DC Orders ED Discharge Orders     None        Pollyann Savoy, MD 11/11/23 [phone]

## 2023-11-19 ENCOUNTER — Ambulatory Visit: Payer: Commercial Managed Care - PPO | Admitting: Family

## 2023-11-19 VITALS — BP 136/88 | HR 94 | Temp 98.1°F | Resp 16 | Ht 65.0 in | Wt 207.0 lb

## 2023-11-19 DIAGNOSIS — S61011A Laceration without foreign body of right thumb without damage to nail, initial encounter: Secondary | ICD-10-CM | POA: Diagnosis not present

## 2023-11-19 DIAGNOSIS — Z789 Other specified health status: Secondary | ICD-10-CM | POA: Diagnosis not present

## 2023-11-19 NOTE — Progress Notes (Unsigned)
Subjective:     Patient ID: Yvonne [name], female    DOB: [birth year], 52 y.o.   MRN: [id number]  No chief complaint on file.   HPI  Discussed the use of AI scribe software for clinical note transcription with the patient, who gave verbal consent to proceed.  History of Present Illness              Health Maintenance Due  Topic Date Due   COVID-19 Vaccine (4 - 2024-25 season) 08/08/2023    Past Medical History:  Diagnosis Date   Anemia    Anxiety    Arthritis    Borderline type 2 diabetes mellitus    High blood pressure    Joint pain    Kidney stones    Microscopic hematuria    Ovarian torsion 2022   Vitamin D deficiency     Past Surgical History:  Procedure Laterality Date   COLONOSCOPY  01/26/2023   LAPAROTOMY N/A 12/02/2021   Procedure: EXPLORATORY LAPAROTOMY,REMOVAL OF RIGHT TUBE AND OVARY;  Surgeon: Carver Fila, MD;  Location: [facility];  Service: Gynecology;  Laterality: N/A;    Family History  Problem Relation Age of Onset   Other Mother        deceased from multiple myelomas   Cancer Mother    Healthy Father    Congestive Heart Failure Father    Heart disease Father        CABG   Diabetes Father    Coronary artery disease Father    Cancer Maternal Grandmother    Multiple myeloma Maternal Grandmother    Cancer Maternal Grandfather        unsure what type of cancer   Diabetes Paternal Grandmother    Prostate cancer Paternal Grandmother    Colon cancer Neg Hx    Esophageal cancer Neg Hx    Liver cancer Neg Hx    Pancreatic cancer Neg Hx    Rectal cancer Neg Hx    Stomach cancer Neg Hx     Social History   Socioeconomic History   Marital status: Married    Spouse name: Not on file   Number of children: 3   Years of education: Not on file   Highest education level: Associate degree: academic program  Occupational History   Occupation: Production assistant, radio: [employer]  Tobacco Use   Smoking status: Never    Smokeless tobacco: Never  Vaping Use   Vaping status: Never Used  Substance and Sexual Activity   Alcohol use: No   Drug use: No   Sexual activity: Yes    Partners: Male    Birth control/protection: Post-menopausal  Other Topics Concern   Not on file  Social History Narrative   5 children (3 are out of the house) 3 biological and 2 step   Married to [name]   Works in patient Access at [name]   12/16/21 lives with husband one daughter at home who is 15 yr            Social Drivers of Corporate investment banker Strain: Low Risk  (07/20/2023)   Overall Financial Resource Strain (CARDIA)    Difficulty of Paying Living Expenses: Not hard at all  Food Insecurity: No Food Insecurity (07/20/2023)   Hunger Vital Sign    Worried About Running Out of Food in the Last Year: Never true    Ran Out of Food in the Last Year: Never  true  Transportation Needs: No Transportation Needs (07/20/2023)   PRAPARE - Administrator, Civil Service (Medical): No    Lack of Transportation (Non-Medical): No  Physical Activity: Insufficiently Active (07/20/2023)   Exercise Vital Sign    Days of Exercise per Week: 3 days    Minutes of Exercise per Session: 30 min  Stress: Stress Concern Present (07/20/2023)   [name] of Occupational Health - Occupational Stress Questionnaire    Feeling of Stress : Very much  Social Connections: Moderately Integrated (07/20/2023)   Social Connection and Isolation Panel [[name]]    Frequency of Communication with Friends and Family: More than three times a week    Frequency of Social Gatherings with Friends and Family: Patient declined    Attends Religious Services: More than 4 times per year    Active Member of [name] or Organizations: No    Attends Engineer, structural: Not on file    Marital Status: Married  Catering manager Violence: Not on file    Outpatient Medications Prior to Visit  Medication Sig Dispense Refill    atorvastatin (LIPITOR) 20 MG tablet Take 1 tablet (20 mg total) by mouth daily. 90 tablet 1   Cholecalciferol (VITAMIN D) 50 MCG (2000 UT) tablet Take 2,000 Units by mouth daily.     cyanocobalamin (VITAMIN B12) 1000 MCG tablet Take 1 tablet (1,000 mcg total) by mouth daily.     escitalopram (LEXAPRO) 20 MG tablet Take 1 tablet (20 mg total) by mouth daily. 90 tablet 0   losartan (COZAAR) 25 MG tablet Take 1 tablet (25 mg total) by mouth daily. 90 tablet 1   tirzepatide (MOUNJARO) 7.5 MG/0.5ML Pen Inject 7.5 mg into the skin once a week. 6 mL 2   tretinoin (RETIN-A) 0.025 % cream Apply topically at bedtime. Apply on M-W-F 45 g 4   Vitamin D, Ergocalciferol, (DRISDOL) 1.25 MG (50000 UNIT) CAPS capsule Take 1 capsule (50,000 Units total) by mouth every 7 (seven) days. 12 capsule 0   No facility-administered medications prior to visit.    Allergies  Allergen Reactions   Red Blood Cells     Jehovah's Witness, does not want to receive blood    ROS     Objective:    Physical Exam   LMP 12/05/2021  Wt Readings from Last 3 Encounters:  10/29/23 206 lb (93.4 kg)  08/04/23 201 lb (91.2 kg)  07/26/23 213 lb (96.6 kg)       Assessment & Plan:   Problem List Items Addressed This Visit   None   I am having Yvonne [name] maintain her cyanocobalamin, Vitamin D, escitalopram, atorvastatin, tretinoin, tirzepatide, losartan, and Vitamin D (Ergocalciferol).  No orders of the defined types were placed in this encounter.

## 2023-11-20 ENCOUNTER — Other Ambulatory Visit: Payer: Self-pay

## 2023-11-20 ENCOUNTER — Other Ambulatory Visit (HOSPITAL_COMMUNITY): Payer: Self-pay

## 2023-11-21 DIAGNOSIS — Z789 Other specified health status: Secondary | ICD-10-CM | POA: Insufficient documentation

## 2023-11-21 DIAGNOSIS — S61011A Laceration without foreign body of right thumb without damage to nail, initial encounter: Secondary | ICD-10-CM | POA: Insufficient documentation

## 2023-11-21 NOTE — Patient Instructions (Signed)
VISIT SUMMARY:  You visited [place] today to follow up on a hand injury you sustained a week ago. You also discussed your current stress levels and how they have been affected by recent life events.  YOUR PLAN:  -THUMB LACERATION: A  laceration is a cut or tear in the skin. You sustained this injury > a week ago and received stitches and a tetanus shot at the ER. There are no signs of infection or complications. Please continue to keep the area clean and dry, apply bacitracin as needed, and monitor for signs of infection such as redness, swelling, increased pain, or pus. If you have any concerns, return for evaluation.  -GENERAL HEALTH MAINTENANCE: Your upcoming physical exam is due in a few months. Please schedule this exam as per routine to ensure your overall health is monitored and maintained.  INSTRUCTIONS:  Please continue to monitor your hand for any signs of infection and return for evaluation if you have any concerns. Additionally, remember to schedule your upcoming physical exam.

## 2023-11-21 NOTE — Assessment & Plan Note (Addendum)
Pt states she is Jehovah's Witness and will not accept blood products.

## 2023-11-21 NOTE — Assessment & Plan Note (Signed)
5 sutures removed without difficulty.  Pt tolerated procedure.

## 2023-11-22 ENCOUNTER — Other Ambulatory Visit: Payer: Self-pay

## 2023-11-27 ENCOUNTER — Other Ambulatory Visit (INDEPENDENT_AMBULATORY_CARE_PROVIDER_SITE_OTHER): Payer: Self-pay | Admitting: Family Medicine

## 2023-11-27 DIAGNOSIS — F3289 Other specified depressive episodes: Secondary | ICD-10-CM

## 2023-11-29 ENCOUNTER — Ambulatory Visit (INDEPENDENT_AMBULATORY_CARE_PROVIDER_SITE_OTHER): Payer: Commercial Managed Care - PPO | Admitting: Family Medicine

## 2023-11-29 ENCOUNTER — Other Ambulatory Visit (HOSPITAL_COMMUNITY): Payer: Self-pay

## 2023-11-29 MED ORDER — ESCITALOPRAM OXALATE 20 MG PO TABS
20.0000 mg | ORAL_TABLET | Freq: Every day | ORAL | 0 refills | Status: DC
  Filled 2023-11-29: qty 90, 90d supply, fill #0

## 2023-11-29 NOTE — Telephone Encounter (Signed)
She was scheduled to see Dr. Dalbert Garnet today but rescheduled due to Dr. Dalbert Garnet being out sick. This was her 2nd time being rescheduled by our office. Are you with sending in her a month supply?  LAST APPOINTMENT DATE: 08/04/2023 NEXT APPOINTMENT DATE: 12/30/2023   [facility] [address] [place] [address] Phone: [phone] Fax: [phone]  Patient is requesting a refill of the following medications: Requested Prescriptions   Pending Prescriptions Disp Refills   escitalopram (LEXAPRO) 20 MG tablet 90 tablet 0    Sig: Take 1 tablet (20 mg total) by mouth daily.    Date last filled: 08/04/2023 Previously prescribed by [facility]  Lab Results  Component Value Date   HGBA1C 6.0 10/29/2023   HGBA1C 6.0 (H) 06/29/2023   HGBA1C 5.7 01/18/2023   Lab Results  Component Value Date   MICROALBUR 1.3 01/18/2023   LDLCALC 108 (H) 06/29/2023   CREATININE 0.99 10/29/2023   Lab Results  Component Value Date   VD25OH 19.31 (L) 10/29/2023   VD25OH 24.4 (L) 06/29/2023   VD25OH 38.48 10/23/2022    BP Readings from Last 3 Encounters:  11/19/23 136/88  11/10/23 (!) 147/94  10/29/23 115/88

## 2023-12-06 ENCOUNTER — Ambulatory Visit: Payer: Commercial Managed Care - PPO | Admitting: Dermatology

## 2023-12-30 ENCOUNTER — Other Ambulatory Visit (HOSPITAL_COMMUNITY): Payer: Self-pay

## 2023-12-30 ENCOUNTER — Encounter (INDEPENDENT_AMBULATORY_CARE_PROVIDER_SITE_OTHER): Payer: Self-pay | Admitting: Family Medicine

## 2023-12-30 ENCOUNTER — Ambulatory Visit (INDEPENDENT_AMBULATORY_CARE_PROVIDER_SITE_OTHER): Payer: Commercial Managed Care - PPO | Admitting: Family Medicine

## 2023-12-30 VITALS — BP 112/76 | HR 122 | Temp 97.9°F | Ht 65.0 in | Wt 194.0 lb

## 2023-12-30 DIAGNOSIS — F3289 Other specified depressive episodes: Secondary | ICD-10-CM

## 2023-12-30 DIAGNOSIS — F5089 Other specified eating disorder: Secondary | ICD-10-CM

## 2023-12-30 DIAGNOSIS — Z7985 Long-term (current) use of injectable non-insulin antidiabetic drugs: Secondary | ICD-10-CM

## 2023-12-30 DIAGNOSIS — E669 Obesity, unspecified: Secondary | ICD-10-CM | POA: Diagnosis not present

## 2023-12-30 DIAGNOSIS — Z6832 Body mass index (BMI) 32.0-32.9, adult: Secondary | ICD-10-CM | POA: Diagnosis not present

## 2023-12-30 DIAGNOSIS — E119 Type 2 diabetes mellitus without complications: Secondary | ICD-10-CM | POA: Diagnosis not present

## 2023-12-30 DIAGNOSIS — Z794 Long term (current) use of insulin: Secondary | ICD-10-CM | POA: Diagnosis not present

## 2023-12-30 MED ORDER — ESCITALOPRAM OXALATE 20 MG PO TABS
20.0000 mg | ORAL_TABLET | Freq: Every day | ORAL | 0 refills | Status: DC
Start: 2023-12-30 — End: 2024-01-04
  Filled 2023-12-30: qty 90, 90d supply, fill #0

## 2023-12-30 NOTE — Progress Notes (Signed)
.[facility]  Office: [phone]  /  Fax: [phone]  WEIGHT SUMMARY AND BIOMETRICS  Anthropometric Measurements Height: 5\' 5"  (1.651 m) Weight: 194 lb (88 kg) BMI (Calculated): 32.28 Weight at Last Visit: 201 lb Weight Lost Since Last Visit: 7 lb Weight Gained Since Last Visit: 0 Starting Weight: 204 lb Total Weight Loss (lbs): 10 lb (4.536 kg)   Body Composition  Body Fat %: 40 % Fat Mass (lbs): 77.8 lbs Muscle Mass (lbs): 110.8 lbs Total Body Water (lbs): 75.6 lbs Visceral Fat Rating : 10   Other Clinical Data Fasting: No Labs: No Today's Visit #: 43 Starting Date: 05/17/17    Chief Complaint: OBESITY  History of Present Illness   The patient, with a history of obesity, emotional eating behaviors, and type 2 diabetes, presents for a follow-up visit after a five-month interval. She reports a weight loss of seven pounds during this period, including over the holiday season. She attributes this progress to adherence to a low-carb diet approximately 85% of the time and a regular exercise regimen of walking for 30 minutes five times per week.  The patient's emotional eating behaviors have reportedly remained stable on Lexapro. She has also been managing multiple stressors, including the illness of her spouse, who is awaiting a lung transplant, and the recent passing of her father. Despite these challenges, she has maintained her weight loss efforts and managed her emotional eating.  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 She also reports changes in her eating habits since her spouse moved to [place] for medical treatment, noting that she now primarily prepares meals for herself, focusing on protein and vegetables. She occasionally indulges in sweets or snacks but reports that overeating leads to discomfort and physical consequences, which discourages such behavior.  The patient's blood sugars  have been stable with no reported issues following an increase in her Mounjaro dosage. She denies experiencing any side effects such as nausea or constipation. The patient is planning to relocate to [place] for several months to support her spouse post-transplant, with arrangements made to work remotely during this period.          PHYSICAL EXAM:  Blood pressure 112/76, pulse (!) 122, temperature 97.9 F (36.6 C), height 5\' 5"  (1.651 m), weight 194 lb (88 kg), last menstrual period 12/05/2021, SpO2 97%. Body mass index is 32.28 kg/m.  DIAGNOSTIC DATA REVIEWED:  BMET    Component Value Date/Time   NA 144 10/29/2023 0722   NA 141 06/29/2023 0829   K 4.4 10/29/2023 0722   CL 108 10/29/2023 0722   CO2 28 10/29/2023 0722   GLUCOSE 101 (H) 10/29/2023 0722   BUN 16 10/29/2023 0722   BUN 24 06/29/2023 0829   CREATININE 0.99 10/29/2023 0722   CREATININE 0.85 04/28/2019 1457   CALCIUM 9.8 10/29/2023 0722   GFRNONAA 58 (L) 12/06/2021 0807   GFRAA 95 01/08/2021 0740   Lab Results  Component Value Date   HGBA1C 6.0 10/29/2023   HGBA1C 5.7 09/14/2011   Lab Results  Component Value Date   INSULIN 51.4 (H) 06/29/2023   INSULIN 27.9 (H) 05/17/2017   Lab Results  Component Value Date   TSH 1.140 06/29/2023   CBC    Component Value Date/Time   WBC 8.1 06/29/2023 0829   WBC 8.8 01/12/2022 0940   RBC 4.41 06/29/2023 0829   RBC 3.87 01/12/2022 0940   HGB 13.7 06/29/2023 0829  HCT 40.5 06/29/2023 0829   PLT 311 06/29/2023 0829   MCV 92 06/29/2023 0829   MCH 31.1 06/29/2023 0829   MCH 31.3 12/07/2021 0711   MCHC 33.8 06/29/2023 0829   MCHC 33.1 01/12/2022 0940   RDW 13.9 06/29/2023 0829   Iron Studies    Component Value Date/Time   IRON 77 01/12/2022 0940   FERRITIN 131.2 01/12/2022 0940   Lipid Panel     Component Value Date/Time   CHOL 204 (H) 06/29/2023 0829   TRIG 81 06/29/2023 0829   HDL 82 06/29/2023 0829   CHOLHDL 3 03/01/2023 0745   VLDL 15.2 03/01/2023  0745   LDLCALC 108 (H) 06/29/2023 0829   LDLCALC 127 (H) 04/28/2019 1457   Hepatic Function Panel     Component Value Date/Time   PROT 6.7 06/29/2023 0829   ALBUMIN 4.1 06/29/2023 0829   AST 16 06/29/2023 0829   ALT 16 06/29/2023 0829   ALKPHOS 142 (H) 06/29/2023 0829   BILITOT <0.2 06/29/2023 0829   BILIDIR 0.1 01/18/2023 0739   IBILI 0.3 04/28/2019 1457      Component Value Date/Time   TSH 1.140 06/29/2023 0829   Nutritional Lab Results  Component Value Date   VD25OH 19.31 (L) 10/29/2023   VD25OH 24.4 (L) 06/29/2023   VD25OH 38.48 10/23/2022     Assessment and Plan    Type 2 Diabetes Mellitus On Mounjaro 7.5 mg with no issues in blood sugars, nausea, or constipation. Managing diet well with portion control, protein, and vegetables. Discussed benefits of Mounjaro in glycemic control and weight management. No significant adverse effects reported. - Continue Mounjaro 7.5 mg - Follow up with Melissa in February for blood work  Obesity Lost seven pounds over five months, including during holidays. Following low-carb eating plan 85% of the time, walking 30 minutes five times per week. Emotional eating behaviors well-managed on Lexapro. Discussed benefits of continued weight loss and exercise, including improved cardiovascular health and glycemic control. - Refill Lexapro - Continue low-carb eating plan - Continue walking for exercise 30 minutes five times per week - Follow up in 12 weeks  Emotional Eating Behaviors Well-managed on Lexapro. Managing stressors related to husband's health and other personal issues. Discussed importance of continued medication adherence and stress management strategies. - Refill Lexapro - Follow up in 12 weeks  General Health Maintenance Managing diet and exercise well. Handling stressors effectively. Discussed importance of maintaining current lifestyle changes for overall health improvement. - Continue low-carb eating plan - Continue  walking for exercise 30 minutes five times per week  Follow-up - Follow up in 12 weeks - Send MyChart message if any issues arise.        She was informed of the importance of frequent follow up visits to maximize her success with intensive lifestyle modifications for her multiple health conditions.    Quillian Quince, MD

## 2024-01-04 ENCOUNTER — Telehealth: Payer: Self-pay | Admitting: Family

## 2024-01-04 ENCOUNTER — Ambulatory Visit: Payer: Commercial Managed Care - PPO | Admitting: Family

## 2024-01-04 ENCOUNTER — Other Ambulatory Visit (HOSPITAL_COMMUNITY): Payer: Self-pay

## 2024-01-04 VITALS — BP 117/85 | HR 98 | Temp 98.5°F | Resp 16 | Ht 65.0 in | Wt 197.0 lb

## 2024-01-04 DIAGNOSIS — E559 Vitamin D deficiency, unspecified: Secondary | ICD-10-CM | POA: Diagnosis not present

## 2024-01-04 DIAGNOSIS — E119 Type 2 diabetes mellitus without complications: Secondary | ICD-10-CM | POA: Diagnosis not present

## 2024-01-04 DIAGNOSIS — Z7985 Long-term (current) use of injectable non-insulin antidiabetic drugs: Secondary | ICD-10-CM | POA: Diagnosis not present

## 2024-01-04 DIAGNOSIS — F3289 Other specified depressive episodes: Secondary | ICD-10-CM

## 2024-01-04 DIAGNOSIS — E782 Mixed hyperlipidemia: Secondary | ICD-10-CM | POA: Diagnosis not present

## 2024-01-04 DIAGNOSIS — I10 Essential (primary) hypertension: Secondary | ICD-10-CM

## 2024-01-04 DIAGNOSIS — E669 Obesity, unspecified: Secondary | ICD-10-CM | POA: Diagnosis not present

## 2024-01-04 LAB — BASIC METABOLIC PANEL
BUN: 16 mg/dL (ref 6–23)
CO2: 26 meq/L (ref 19–32)
Calcium: 10.1 mg/dL (ref 8.4–10.5)
Chloride: 108 meq/L (ref 96–112)
Creatinine, Ser: 0.97 mg/dL (ref 0.40–1.20)
GFR: 67.75 mL/min (ref >60.00)
Glucose, Bld: 88 mg/dL (ref 70–99)
Potassium: 3.6 meq/L (ref 3.5–5.1)
Sodium: 143 meq/L (ref 135–145)

## 2024-01-04 LAB — LIPID PANEL
Cholesterol: 215 mg/dL — ABNORMAL HIGH (ref 0–200)
HDL: 70.9 mg/dL (ref >39.00)
LDL Cholesterol: 129 mg/dL — ABNORMAL HIGH (ref 0–99)
NonHDL: 143.64
Total CHOL/HDL Ratio: 3
Triglycerides: 71 mg/dL (ref 0.0–149.0)
VLDL: 14.2 mg/dL (ref 0.0–40.0)

## 2024-01-04 LAB — MICROALBUMIN / CREATININE URINE RATIO
Creatinine,U: 345.2 mg/dL
Microalb Creat Ratio: 1.2 mg/g (ref 0.0–30.0)
Microalb, Ur: 4.3 mg/dL — ABNORMAL HIGH (ref 0.0–1.9)

## 2024-01-04 MED ORDER — ATORVASTATIN CALCIUM 40 MG PO TABS
40.0000 mg | ORAL_TABLET | Freq: Every day | ORAL | 1 refills | Status: DC
  Filled 2024-01-04: qty 90, 90d supply, fill #0
  Filled 2024-04-03: qty 90, 90d supply, fill #1

## 2024-01-04 MED ORDER — ATORVASTATIN CALCIUM 20 MG PO TABS
20.0000 mg | ORAL_TABLET | Freq: Every day | ORAL | 1 refills | Status: DC
  Filled 2024-01-04: qty 90, 90d supply, fill #0

## 2024-01-04 MED ORDER — ESCITALOPRAM OXALATE 20 MG PO TABS
20.0000 mg | ORAL_TABLET | Freq: Every day | ORAL | 1 refills | Status: DC
  Filled 2024-01-04 – 2024-02-21 (×2): qty 90, 90d supply, fill #0
  Filled 2024-05-25: qty 90, 90d supply, fill #1

## 2024-01-04 MED ORDER — LOSARTAN POTASSIUM 25 MG PO TABS
25.0000 mg | ORAL_TABLET | Freq: Every day | ORAL | 1 refills | Status: DC
  Filled 2024-01-04: qty 90, 90d supply, fill #0
  Filled 2024-04-10: qty 90, 90d supply, fill #1

## 2024-01-04 NOTE — Assessment & Plan Note (Signed)
BP Readings from Last 3 Encounters:  01/04/24 117/85  12/30/23 112/76  11/19/23 136/88   BP remains at goal. Continue losartan.

## 2024-01-04 NOTE — Assessment & Plan Note (Signed)
Reports good mood on lexapro.

## 2024-01-04 NOTE — Assessment & Plan Note (Addendum)
Lab Results  Component Value Date   HGBA1C 6.0 10/29/2023   HGBA1C 6.0 (H) 06/29/2023   HGBA1C 5.7 01/18/2023   Lab Results  Component Value Date   MICROALBUR 1.3 01/18/2023   LDLCALC 108 (H) 06/29/2023   CREATININE 0.99 10/29/2023   A1C at goal, continue mounjaro.

## 2024-01-04 NOTE — Assessment & Plan Note (Signed)
Doing well on Mounjaro.  Her weight is trending downward.

## 2024-01-04 NOTE — Assessment & Plan Note (Signed)
Lab Results  Component Value Date   CHOL 204 (H) 06/29/2023   HDL 82 06/29/2023   LDLCALC 108 (H) 06/29/2023   TRIG 81 06/29/2023   CHOLHDL 3 03/01/2023   Maintained on lipitor 20mg , last lipid panel slightly above goal. Will update lipid panel today.

## 2024-01-04 NOTE — Patient Instructions (Signed)
VISIT SUMMARY:  Today, you came in for a routine follow-up visit. You mentioned that you and your husband, who recently had a lung transplant, are planning to leave the state for six months to support his recovery. You reported no current concerns or complaints and shared that you have been taking Mounjaro, which has helped you lose approximately seven pounds. You also mentioned experiencing some gastrointestinal discomfort if you overeat while on this medication. Your depression is well-managed with Lexapro, and your blood pressure and cholesterol are well-controlled with your current medications. You inquired about the Pneumovax vaccine due to your husband's compromised immune system post-transplant. Additionally, you reported some sensation at the site of a previous cut, which is improving over time.  YOUR PLAN:  -HYPERTENSION: Hypertension means high blood pressure. Your blood pressure is well-controlled with Losartan, so no changes are needed. Please continue taking Losartan as prescribed.  -HYPERLIPIDEMIA: Hyperlipidemia means high cholesterol levels. Your cholesterol is slightly above the goal, so we will repeat your lipid panel today. Please continue taking Atorvastatin 20mg  as prescribed.  -DEPRESSION: Depression is a mood disorder characterized by persistent feelings of sadness and loss of interest. Your depression is stable with Lexapro, so no changes are needed. Please continue taking Lexapro as prescribed.  -VITAMIN D DEFICIENCY: Vitamin D deficiency means you have lower than normal levels of vitamin D. You are on a high dose supplementation regimen. Please continue your current regimen and we will check your Vitamin D level at your next visit with Dr. Dalbert Garnet in April 2025. After completing your current regimen, we plan to transition you to daily supplementation.  -DIABETES: Diabetes is a condition that affects how your body processes blood sugar. Your last A1C was 6.0 in November 2024.  We will check your A1C again at your next visit with Dr. Dalbert Garnet in April 2025.  -GENERAL HEALTH MAINTENANCE: For your general health, we will check your urine microalbumin today. Consider getting the Pneumovax, pending insurance coverage. Continue taking Mounjaro for weight loss and notify your provider before running out of your second refill for additional refills. Plan for a follow-up visit after you return from out of state in several months.  INSTRUCTIONS:  Please follow up with Dr. Dalbert Garnet in April 2025 for your next visit. We will check your A1C and Vitamin D levels at that time. Plan for a follow-up visit after you return from out of state in several months.

## 2024-01-04 NOTE — Progress Notes (Signed)
Subjective:     Patient ID: Yvonne [name], female    DOB: Dec 28, 1970, 53 y.o.   MRN: [id number]  No chief complaint on file.   HPI  Discussed the use of AI scribe software for clinical note transcription with the patient, who gave verbal consent to proceed.  History of Present Illness   The patient presents for a routine follow-up visit. She is accompanied by her daughter.  Her husband recently underwent a lung transplant on January 23rd up at [facility] in [place].  She and her husband are planning to stay in [place] for the next six months to support his recovery. She states that he is  is recovering well.  She has no current concerns or complaints. She has been taking Mounjaro and has lost approximately seven pounds, with her weight recorded at 194 pounds. She experiences gastrointestinal discomfort if she overeats while on this medication but denies any other side effects. She is currently taking Lexapro for depression and reports feeling fine with no issues. She is also on a vitamin D regimen, with a month left of her current supply. Her blood pressure is well-controlled on losartan, and she is taking atorvastatin 20 mg for cholesterol management. Her last A1c was 6.0 in November, and she plans to have it rechecked in April.  She inquires about the Pneumovax vaccine due to her husband's compromised immune system post-transplant. She is considering getting it at a pharmacy if insurance covers it.  She reports sensation at the site of a previous cut, likely due to scar tissue and possible nerve involvement. The sensation is improving over time.     Lab Results  Component Value Date   HGBA1C 6.0 10/29/2023   HGBA1C 6.0 (H) 06/29/2023   HGBA1C 5.7 01/18/2023   Lab Results  Component Value Date   MICROALBUR 1.3 01/18/2023   LDLCALC 108 (H) 06/29/2023   CREATININE 0.99 10/29/2023      Wt Readings from Last 3 Encounters:  01/04/24 197 lb (89.4 kg)  12/30/23 194 lb (88 kg)   11/19/23 207 lb (93.9 kg)     Health Maintenance Due  Topic Date Due   Pneumococcal Vaccine 85-19 Years old (1 of 2 - PCV) Never done   COVID-19 Vaccine (4 - 2024-25 season) 08/08/2023   Diabetic kidney evaluation - Urine ACR  01/19/2024    Past Medical History:  Diagnosis Date   Anemia    Anxiety    Arthritis    Borderline type 2 diabetes mellitus    High blood pressure    Joint pain    Kidney stones    Microscopic hematuria    Ovarian torsion 2022   Vitamin D deficiency     Past Surgical History:  Procedure Laterality Date   COLONOSCOPY  01/26/2023   LAPAROTOMY N/A 12/02/2021   Procedure: EXPLORATORY LAPAROTOMY,REMOVAL OF RIGHT TUBE AND OVARY;  Surgeon: Carver Fila, MD;  Location: [facility];  Service: Gynecology;  Laterality: N/A;    Family History  Problem Relation Age of Onset   Other Mother        deceased from multiple myelomas   Cancer Mother    Healthy Father    Congestive Heart Failure Father    Heart disease Father        CABG   Diabetes Father    Coronary artery disease Father    Cancer Maternal Grandmother    Multiple myeloma Maternal Grandmother    Cancer Maternal Grandfather  unsure what type of cancer   Diabetes Paternal Grandmother    Prostate cancer Paternal Grandmother    Colon cancer Neg Hx    Esophageal cancer Neg Hx    Liver cancer Neg Hx    Pancreatic cancer Neg Hx    Rectal cancer Neg Hx    Stomach cancer Neg Hx     Social History   Socioeconomic History   Marital status: Married    Spouse name: Not on file   Number of children: 3   Years of education: Not on file   Highest education level: Associate degree: academic program  Occupational History   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    Employer: [employer]  Tobacco Use   Smoking status: Never   Smokeless tobacco: Never  Vaping Use   Vaping status: Never Used  Substance and Sexual Activity   Alcohol use: No   Drug use: No   Sexual activity: Yes    Partners:  Male    Birth control/protection: Post-menopausal  Other Topics Concern   Not on file  Social History Narrative   5 children (3 are out of the house) 3 biological and 2 step   Married to [name]   Works in patient Access at [name]   12/16/21 lives with husband one daughter at home who is 15 yr            Social Drivers of Corporate investment banker Strain: Low Risk  (01/02/2024)   Overall Financial Resource Strain (CARDIA)    Difficulty of Paying Living Expenses: Not hard at all  Food Insecurity: No Food Insecurity (01/02/2024)   Hunger Vital Sign    Worried About Running Out of Food in the Last Year: Never true    Ran Out of Food in the Last Year: Never true  Transportation Needs: No Transportation Needs (01/02/2024)   PRAPARE - Administrator, Civil Service (Medical): No    Lack of Transportation (Non-Medical): No  Physical Activity: Sufficiently Active (01/02/2024)   Exercise Vital Sign    Days of Exercise per Week: 7 days    Minutes of Exercise per Session: 30 min  Stress: No Stress Concern Present (01/02/2024)   [name] of Occupational Health - Occupational Stress Questionnaire    Feeling of Stress : Not at all  Social Connections: Moderately Integrated (01/02/2024)   Social Connection and Isolation Panel [[name]]    Frequency of Communication with Friends and Family: More than three times a week    Frequency of Social Gatherings with Friends and Family: Three times a week    Attends Religious Services: More than 4 times per year    Active Member of Clubs or Organizations: No    Attends Engineer, structural: Not on file    Marital Status: Married  Catering manager Violence: Not on file    Outpatient Medications Prior to Visit  Medication Sig Dispense Refill   Cholecalciferol (VITAMIN D) 50 MCG (2000 UT) tablet Take 2,000 Units by mouth daily.     cyanocobalamin (VITAMIN B12) 1000 MCG tablet Take 1 tablet (1,000 mcg total) by  mouth daily.     tirzepatide (MOUNJARO) 7.5 MG/0.5ML Pen Inject 7.5 mg into the skin once a week. 6 mL 2   tretinoin (RETIN-A) 0.025 % cream Apply topically at bedtime. Apply on M-W-F 45 g 4   Vitamin D, Ergocalciferol, (DRISDOL) 1.25 MG (50000 UNIT) CAPS capsule Take 1 capsule (50,000 Units total) by mouth every  7 (seven) days. 12 capsule 0   atorvastatin (LIPITOR) 20 MG tablet Take 1 tablet (20 mg total) by mouth daily. 90 tablet 1   escitalopram (LEXAPRO) 20 MG tablet Take 1 tablet (20 mg total) by mouth daily. 90 tablet 0   losartan (COZAAR) 25 MG tablet Take 1 tablet (25 mg total) by mouth daily. 90 tablet 1   No facility-administered medications prior to visit.    Allergies  Allergen Reactions   Red Blood Cells     Jehovah's Witness, does not want to receive blood    ROS    See HPI Objective:    Physical Exam Constitutional:      General: She is not in acute distress.    Appearance: Normal appearance. She is well-developed.  HENT:     Head: Normocephalic and atraumatic.     Right Ear: External ear normal.     Left Ear: External ear normal.  Eyes:     General: No scleral icterus. Neck:     Thyroid: No thyromegaly.  Cardiovascular:     Rate and Rhythm: Normal rate and regular rhythm.     Heart sounds: Normal heart sounds. No murmur heard. Pulmonary:     Effort: Pulmonary effort is normal. No respiratory distress.     Breath sounds: Normal breath sounds. No wheezing.  Musculoskeletal:     Cervical back: Neck supple.  Skin:    General: Skin is warm and dry.     Comments: Scar noted at site of thumb laceration  Neurological:     Mental Status: She is alert and oriented to person, place, and time.  Psychiatric:        Mood and Affect: Mood normal.        Behavior: Behavior normal.        Thought Content: Thought content normal.        Judgment: Judgment normal.      BP 117/85 (BP Location: Right Arm, Patient Position: Sitting, Cuff Size: Normal)   Pulse 98    Temp 98.5 F (36.9 C) (Oral)   Resp 16   Ht 5\' 5"  (1.651 m)   Wt 197 lb (89.4 kg)   LMP 12/05/2021   SpO2 96%   BMI 32.78 kg/m  Wt Readings from Last 3 Encounters:  01/04/24 197 lb (89.4 kg)  12/30/23 194 lb (88 kg)  11/19/23 207 lb (93.9 kg)       Assessment & Plan:   Problem List Items Addressed This Visit       High   Controlled type 2 diabetes mellitus without complication, without long-term current use of insulin (HCC)   Lab Results  Component Value Date   HGBA1C 6.0 10/29/2023   HGBA1C 6.0 (H) 06/29/2023   HGBA1C 5.7 01/18/2023   Lab Results  Component Value Date   MICROALBUR 1.3 01/18/2023   LDLCALC 108 (H) 06/29/2023   CREATININE 0.99 10/29/2023   A1C at goal, continue mounjaro.       Relevant Medications   losartan (COZAAR) 25 MG tablet   atorvastatin (LIPITOR) 20 MG tablet     Unprioritized   Vitamin D deficiency   Completed 8 of 12 week course of vit D. She will be back in town in April to see [facility] and can have level rechecked at that time.       Hypertension   BP Readings from Last 3 Encounters:  01/04/24 117/85  12/30/23 112/76  11/19/23 136/88   BP remains at  goal. Continue losartan.       Relevant Medications   losartan (COZAAR) 25 MG tablet   atorvastatin (LIPITOR) 20 MG tablet   Hyperlipidemia - Primary   Lab Results  Component Value Date   CHOL 204 (H) 06/29/2023   HDL 82 06/29/2023   LDLCALC 108 (H) 06/29/2023   TRIG 81 06/29/2023   CHOLHDL 3 03/01/2023   Maintained on lipitor 20mg , last lipid panel slightly above goal. Will update lipid panel today.       Relevant Medications   losartan (COZAAR) 25 MG tablet   atorvastatin (LIPITOR) 20 MG tablet   Other Relevant Orders   Urine Microalbumin w/creat. ratio   Generalized obesity   Doing well on Mounjaro.  Her weight is trending downward.        Depression   Reports good mood on lexapro.        Relevant Medications   escitalopram (LEXAPRO) 20  MG tablet    I am having Yvonne [name] maintain her cyanocobalamin, Vitamin D, tretinoin, tirzepatide, Vitamin D (Ergocalciferol), escitalopram, losartan, and atorvastatin.  Meds ordered this encounter  Medications   escitalopram (LEXAPRO) 20 MG tablet    Sig: Take 1 tablet (20 mg total) by mouth daily.    Dispense:  90 tablet    Refill:  1    Supervising Provider:   Danise Edge A [[id number]]   losartan (COZAAR) 25 MG tablet    Sig: Take 1 tablet (25 mg total) by mouth daily.    Dispense:  90 tablet    Refill:  1    Supervising Provider:   Danise Edge A [[id number]]   atorvastatin (LIPITOR) 20 MG tablet    Sig: Take 1 tablet (20 mg total) by mouth daily.    Dispense:  90 tablet    Refill:  1    Supervising Provider:   Danise Edge A [[id number]]

## 2024-01-04 NOTE — Telephone Encounter (Signed)
See mychart.

## 2024-01-04 NOTE — Assessment & Plan Note (Addendum)
Completed 8 of 12 week course of vit D. She will be back in town in April to see [facility] and can have level rechecked at that time.

## 2024-01-10 ENCOUNTER — Other Ambulatory Visit (HOSPITAL_COMMUNITY): Payer: Self-pay

## 2024-01-21 ENCOUNTER — Encounter: Payer: Commercial Managed Care - PPO | Admitting: Family

## 2024-01-26 ENCOUNTER — Other Ambulatory Visit (HOSPITAL_COMMUNITY): Payer: Self-pay

## 2024-01-27 ENCOUNTER — Other Ambulatory Visit: Payer: Self-pay

## 2024-01-27 ENCOUNTER — Ambulatory Visit: Payer: Commercial Managed Care - PPO | Admitting: Dermatology

## 2024-01-27 ENCOUNTER — Encounter: Payer: Self-pay | Admitting: Pharmacist

## 2024-01-28 ENCOUNTER — Other Ambulatory Visit (HOSPITAL_COMMUNITY): Payer: Self-pay

## 2024-02-21 ENCOUNTER — Other Ambulatory Visit: Payer: Self-pay

## 2024-02-24 ENCOUNTER — Other Ambulatory Visit (HOSPITAL_COMMUNITY): Payer: Self-pay

## 2024-02-24 DIAGNOSIS — Z01419 Encounter for gynecological examination (general) (routine) without abnormal findings: Secondary | ICD-10-CM | POA: Diagnosis not present

## 2024-02-24 DIAGNOSIS — Z1331 Encounter for screening for depression: Secondary | ICD-10-CM | POA: Diagnosis not present

## 2024-02-24 DIAGNOSIS — Z1231 Encounter for screening mammogram for malignant neoplasm of breast: Secondary | ICD-10-CM | POA: Diagnosis not present

## 2024-02-24 LAB — HM MAMMOGRAPHY

## 2024-02-24 MED ORDER — PNEUMOCOCCAL 21-VAL CONJ VACC 0.5 ML IM SOSY
PREFILLED_SYRINGE | INTRAMUSCULAR | 0 refills | Status: DC
  Filled 2024-02-24: qty 0.5, 1d supply, fill #0
  Filled 2024-02-24: qty 0.5, 30d supply, fill #0

## 2024-02-25 ENCOUNTER — Other Ambulatory Visit (HOSPITAL_COMMUNITY): Payer: Self-pay

## 2024-02-25 ENCOUNTER — Encounter: Payer: Self-pay | Admitting: Family

## 2024-03-01 ENCOUNTER — Ambulatory Visit: Payer: Commercial Managed Care - PPO | Admitting: Family

## 2024-03-14 ENCOUNTER — Other Ambulatory Visit (HOSPITAL_COMMUNITY): Payer: Self-pay

## 2024-03-15 ENCOUNTER — Other Ambulatory Visit (HOSPITAL_COMMUNITY): Payer: Self-pay

## 2024-03-15 ENCOUNTER — Other Ambulatory Visit: Payer: Self-pay

## 2024-03-20 ENCOUNTER — Ambulatory Visit (INDEPENDENT_AMBULATORY_CARE_PROVIDER_SITE_OTHER): Payer: Commercial Managed Care - PPO | Admitting: Family Medicine

## 2024-04-04 ENCOUNTER — Encounter: Admitting: Physician Assistant

## 2024-04-04 ENCOUNTER — Ambulatory Visit: Admitting: Family

## 2024-04-12 ENCOUNTER — Ambulatory Visit: Admitting: Family

## 2024-04-12 ENCOUNTER — Telehealth: Payer: Self-pay

## 2024-04-12 ENCOUNTER — Other Ambulatory Visit (HOSPITAL_COMMUNITY): Payer: Self-pay

## 2024-04-12 VITALS — BP 115/84 | HR 97 | Temp 98.7°F | Resp 16 | Ht 65.0 in | Wt 201.0 lb

## 2024-04-12 DIAGNOSIS — Z7985 Long-term (current) use of injectable non-insulin antidiabetic drugs: Secondary | ICD-10-CM

## 2024-04-12 DIAGNOSIS — E7800 Pure hypercholesterolemia, unspecified: Secondary | ICD-10-CM

## 2024-04-12 DIAGNOSIS — Z807 Family history of other malignant neoplasms of lymphoid, hematopoietic and related tissues: Secondary | ICD-10-CM

## 2024-04-12 DIAGNOSIS — E559 Vitamin D deficiency, unspecified: Secondary | ICD-10-CM | POA: Diagnosis not present

## 2024-04-12 DIAGNOSIS — F3289 Other specified depressive episodes: Secondary | ICD-10-CM | POA: Diagnosis not present

## 2024-04-12 DIAGNOSIS — E119 Type 2 diabetes mellitus without complications: Secondary | ICD-10-CM

## 2024-04-12 DIAGNOSIS — Z23 Encounter for immunization: Secondary | ICD-10-CM | POA: Diagnosis not present

## 2024-04-12 DIAGNOSIS — Z0000 Encounter for general adult medical examination without abnormal findings: Secondary | ICD-10-CM

## 2024-04-12 DIAGNOSIS — E1169 Type 2 diabetes mellitus with other specified complication: Secondary | ICD-10-CM

## 2024-04-12 LAB — BASIC METABOLIC PANEL WITH GFR
BUN: 17 mg/dL (ref 6–23)
CO2: 28 meq/L (ref 19–32)
Calcium: 10.3 mg/dL (ref 8.4–10.5)
Chloride: 107 meq/L (ref 96–112)
Creatinine, Ser: 0.89 mg/dL (ref 0.40–1.20)
GFR: 74.98 mL/min (ref >60.00)
Glucose, Bld: 91 mg/dL (ref 70–99)
Potassium: 4.7 meq/L (ref 3.5–5.1)
Sodium: 141 meq/L (ref 135–145)

## 2024-04-12 LAB — LIPID PANEL
Cholesterol: 159 mg/dL (ref 0–200)
HDL: 73.2 mg/dL (ref >39.00)
LDL Cholesterol: 74 mg/dL (ref 0–99)
NonHDL: 85.41
Total CHOL/HDL Ratio: 2
Triglycerides: 56 mg/dL (ref 0.0–149.0)
VLDL: 11.2 mg/dL (ref 0.0–40.0)

## 2024-04-12 LAB — HEMOGLOBIN A1C: Hgb A1c MFr Bld: 5.9 % (ref 4.6–6.5)

## 2024-04-12 LAB — VITAMIN D 25 HYDROXY (VIT D DEFICIENCY, FRACTURES): VITD: 49.12 ng/mL (ref 30.00–100.00)

## 2024-04-12 MED ORDER — OZEMPIC (0.25 OR 0.5 MG/DOSE) 2 MG/3ML ~~LOC~~ SOPN
0.2500 mg | PEN_INJECTOR | SUBCUTANEOUS | 2 refills | Status: DC
Start: 2024-04-12 — End: 2024-05-17
  Filled 2024-04-12 (×2): qty 3, 56d supply, fill #0
  Filled 2024-04-20: qty 3, 28d supply, fill #0
  Filled 2024-05-15: qty 3, 28d supply, fill #1

## 2024-04-12 NOTE — Assessment & Plan Note (Signed)
 Update vit D level.

## 2024-04-12 NOTE — Assessment & Plan Note (Signed)
Mood stable- on lexapro 

## 2024-04-12 NOTE — Assessment & Plan Note (Signed)
 Lab Results  Component Value Date   HGBA1C 6.0 10/29/2023   HGBA1C 6.0 (H) 06/29/2023   HGBA1C 5.7 01/18/2023   Lab Results  Component Value Date   MICROALBUR 4.3 (H) 01/04/2024   LDLCALC 129 (H) 01/04/2024   CREATININE 0.97 01/04/2024   Had GI side effects on mounjaro - previously did better on Ozempic . Will change to ozempic . A1C stable.

## 2024-04-12 NOTE — Telephone Encounter (Signed)
 Pharmacy Patient Advocate Encounter   Received notification from Patient Pharmacy that prior authorization for Ozempic  0.25 is required/requested.   Insurance verification completed.   The patient is insured through [facility] .   Per test claim: The current 28 day co-pay is, $24.99.  No PA needed at this time. This test claim was processed through [facility]- copay amounts may vary at other pharmacies due to pharmacy/plan contracts, or as the patient moves through the different stages of their insurance plan.

## 2024-04-12 NOTE — Assessment & Plan Note (Signed)
  Routine wellness visit. Emphasized importance of vaccinations due to husband's lung transplant history. - Administer Prevnar vaccine. - Recommend updating COVID booster in the fall. - Recommend annual flu shot. -pap/colo/mammo up to date

## 2024-04-12 NOTE — Addendum Note (Signed)
 Addended by: Dorrene Gaucher on: 04/12/2024 02:43 PM   Modules accepted: Orders

## 2024-04-12 NOTE — Assessment & Plan Note (Signed)
 Maintained on atorvastatin. Update lipid panel.

## 2024-04-12 NOTE — Patient Instructions (Signed)
 VISIT SUMMARY:  You came in for your annual physical exam and discussed concerns about side effects from Mounjaro  medication. We reviewed your history with other medications and your current health maintenance. We also talked about your husband's health and your potential role as a kidney donor.  YOUR PLAN:  TYPE 2 DIABETES MELLITUS: You have type 2 diabetes and experienced adverse effects with Mounjaro . -We will switch you back to Ozempic , starting at the lowest dose, as you tolerated it well before. -I will prescribe pen needles for the Ozempic . -We will check your A1c level to monitor your diabetes.  ADVERSE EFFECTS OF MOUNJARO : You had gastrointestinal side effects from Mounjaro , including diarrhea, vomiting, and 'sulfur burps'. -You have discontinued Mounjaro  due to these side effects.  ABDOMINAL PAIN DUE TO SAXENDA : You had severe abdominal pain with Saxenda , which led to hospitalization. -Saxenda  is not currently in use, and we will avoid it in the future.  WELLNESS VISIT: This was your routine wellness visit. -We administered the Prevnar vaccine today. -I recommend updating your COVID booster in the fall. -I recommend getting your annual flu shot. -We will schedule a follow-up visit in three months.  VITAMIN D  DEFICIENCY: You had a previously identified vitamin D  deficiency. -We will recheck your vitamin D  levels.

## 2024-04-12 NOTE — Progress Notes (Signed)
 Subjective:     Patient ID: Yvonne [name], female    DOB: 1971-08-31, 53 y.o.   MRN: [id number]  Chief Complaint  Patient presents with   Annual Exam    HPI  Discussed the use of AI scribe software for clinical note transcription with the patient, who gave verbal consent to proceed.  History of Present Illness   Yvonne [name] is a 53 year old female who presents for an annual physical exam and concerns regarding Mounjaro  medication side effects.  She experiences gastrointestinal side effects from Mounjaro , including diarrhea, vomiting, and 'sulfur burps', leading to discontinuation of the medication two weeks ago. She was on a 7.5 mg dose. She has a history of similar issues with Saxenda , which resulted in hospitalization for severe abdominal pain. She tolerated Ozempic  and Wegovy  without issues.  Her husband may require a kidney transplant due to medication effects on his kidneys, compounded by diabetes and kidney disease. She is considering being a kidney donor if necessary.  She is up to date with health maintenance, including a colonoscopy last February, a pap smear in 2023, and a mammogram. A vision exam is scheduled for next week, and she recently had a dental appointment. She has no current issues with cough, cold symptoms, skin issues, hearing, vision, swelling in the legs, constipation, diarrhea, urinary concerns, muscle or joint pain, headaches, depression, or anxiety. She continues to take Lexapro .  Her diet is healthy, primarily due to her husband's dietary needs, focusing on protein and vegetables, with minimal fast food or takeout. She feels hungry since stopping Mounjaro . Her exercise routine includes walking three days a week, and she plans to incorporate ankle weights to improve muscle strength.  She notes a sensation of her bones 'creaking' when climbing stairs, though it is not painful. She has no other joint pain or mobility issues.  Immunizations: prevnar 20  due Diet: struggling since she stopped mounjaro  Exercise: walks regularly Wt Readings from Last 3 Encounters:  04/12/24 201 lb (91.2 kg)  01/04/24 197 lb (89.4 kg)  12/30/23 194 lb (88 kg)  Colonoscopy: 2/24 Pap Smear: up to date Mammo 02/24/24 Vision: next week.  Dental: up to date  Lab Results  Component Value Date   HGBA1C 6.0 10/29/2023       Health Maintenance Due  Topic Date Due   COVID-19 Vaccine (4 - 2024-25 season) 08/08/2023   OPHTHALMOLOGY EXAM  04/01/2024    Past Medical History:  Diagnosis Date   Anemia    Anxiety    Arthritis    Borderline type 2 diabetes mellitus    High blood pressure    Joint pain    Kidney stones    Microscopic hematuria    Ovarian torsion 2022   Vitamin D  deficiency     Past Surgical History:  Procedure Laterality Date   COLONOSCOPY  01/26/2023   LAPAROTOMY N/A 12/02/2021   Procedure: EXPLORATORY LAPAROTOMY,REMOVAL OF RIGHT TUBE AND OVARY;  Surgeon: Suzi Essex, MD;  Location: [facility];  Service: Gynecology;  Laterality: N/A;    Family History  Problem Relation Age of Onset   Other Mother        deceased from multiple myelomas   Cancer Mother    Healthy Father    Congestive Heart Failure Father    Heart disease Father        CABG   Diabetes Father    Coronary artery disease Father    Cancer Maternal Grandmother    Multiple  myeloma Maternal Grandmother    Cancer Maternal Grandfather        unsure what type of cancer   Diabetes Paternal Grandmother    Prostate cancer Paternal Grandmother    Colon cancer Neg Hx    Esophageal cancer Neg Hx    Liver cancer Neg Hx    Pancreatic cancer Neg Hx    Rectal cancer Neg Hx    Stomach cancer Neg Hx     Social History   Socioeconomic History   Marital status: Married    Spouse name: Not on file   Number of children: 3   Years of education: Not on file   Highest education level: Associate degree: academic program  Occupational History   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    Employer: [employer]  Tobacco Use   Smoking status: Never   Smokeless tobacco: Never  Vaping Use   Vaping status: Never Used  Substance and Sexual Activity   Alcohol use: No   Drug use: No   Sexual activity: Yes    Partners: Male    Birth control/protection: Post-menopausal  Other Topics Concern   Not on file  Social History Narrative   5 children (3 are out of the house) 3 biological and 2 step   Married to [name]   Works in patient Access at [name]   12/16/21 lives with husband one daughter at home who is 15 yr            Social Drivers of Corporate investment banker Strain: Low Risk  (01/02/2024)   Overall Financial Resource Strain (CARDIA)    Difficulty of Paying Living Expenses: Not hard at all  Food Insecurity: No Food Insecurity (01/02/2024)   Hunger Vital Sign    Worried About Running Out of Food in the Last Year: Never true    Ran Out of Food in the Last Year: Never true  Transportation Needs: No Transportation Needs (01/02/2024)   PRAPARE - Administrator, Civil Service (Medical): No    Lack of Transportation (Non-Medical): No  Physical Activity: Sufficiently Active (01/02/2024)   Exercise Vital Sign    Days of Exercise per Week: 7 days    Minutes of Exercise per Session: 30 min  Stress: No Stress Concern Present (01/02/2024)   [name] of Occupational Health - Occupational Stress Questionnaire    Feeling of Stress : Not at all  Social Connections: Moderately Integrated (01/02/2024)   Social Connection and Isolation Panel [[name]]    Frequency of Communication with Friends and Family: More than three times a week    Frequency of Social Gatherings with Friends and Family: Three times a week    Attends Religious Services: More than 4 times per year    Active Member of Clubs or Organizations: No    Attends Engineer, structural: Not on file    Marital Status: Married  Catering manager Violence: Not on file     Outpatient Medications Prior to Visit  Medication Sig Dispense Refill   atorvastatin  (LIPITOR) 40 MG tablet Take 1 tablet (40 mg total) by mouth daily. 90 tablet 1   cyanocobalamin  (VITAMIN B12) 1000 MCG tablet Take 1 tablet (1,000 mcg total) by mouth daily.     escitalopram  (LEXAPRO ) 20 MG tablet Take 1 tablet (20 mg total) by mouth daily. 90 tablet 1   losartan  (COZAAR ) 25 MG tablet Take 1 tablet (25 mg total) by mouth daily. 90 tablet 1  pneumococcal 21-valent conjugate vaccine (CAPVAXIVE) 0.5 ML injection Inject into the muscle. 0.5 mL 0   tretinoin  (RETIN-A ) 0.025 % cream Apply topically at bedtime. Apply on M-W-F 45 g 4   tirzepatide  (MOUNJARO ) 7.5 MG/0.5ML Pen Inject 7.5 mg into the skin once a week. 6 mL 2   Cholecalciferol (VITAMIN D ) 50 MCG (2000 UT) tablet Take 2,000 Units by mouth daily. (Patient not taking: Reported on 04/12/2024)     Vitamin D , Ergocalciferol , (DRISDOL ) 1.25 MG (50000 UNIT) CAPS capsule Take 1 capsule (50,000 Units total) by mouth every 7 (seven) days. 12 capsule 0   No facility-administered medications prior to visit.    Allergies  Allergen Reactions   Red Blood Cells     Jehovah's Witness, does not want to receive blood    Review of Systems  Constitutional:  Negative for weight loss.  HENT:  Negative for congestion and hearing loss.   Eyes:  Negative for blurred vision.  Respiratory:  Negative for cough.   Cardiovascular:  Negative for leg swelling.  Gastrointestinal:  Negative for constipation and diarrhea.  Genitourinary:  Negative for dysuria and frequency.  Musculoskeletal:  Negative for joint pain and myalgias.  Skin:  Negative for rash.  Neurological:  Negative for headaches.  Psychiatric/Behavioral:  Negative for depression. The patient is not nervous/anxious.        Objective:    Physical Exam   BP 115/84 (BP Location: Right Arm, Patient Position: Sitting, Cuff Size: Large)   Pulse 97   Temp 98.7 F (37.1 C) (Oral)   Resp 16    Ht 5\' 5"  (1.651 m)   Wt 201 lb (91.2 kg)   LMP 12/05/2021   SpO2 98%   BMI 33.45 kg/m  Wt Readings from Last 3 Encounters:  04/12/24 201 lb (91.2 kg)  01/04/24 197 lb (89.4 kg)  12/30/23 194 lb (88 kg)   Physical Exam  Constitutional: She is oriented to person, place, and time. She appears well-developed and well-nourished. No distress.  HENT:  Head: Normocephalic and atraumatic.  Right Ear: Tympanic membrane and ear canal normal.  Left Ear: Tympanic membrane and ear canal normal.  Mouth/Throat: Oropharynx is clear and moist.  Eyes: Pupils are equal, round, and reactive to light. No scleral icterus.  Neck: Normal range of motion. No thyromegaly present.  Cardiovascular: Normal rate and regular rhythm.   No murmur heard. Pulmonary/Chest: Effort normal and breath sounds normal. No respiratory distress. He has no wheezes. She has no rales. She exhibits no tenderness.  Abdominal: Soft. Bowel sounds are normal. She exhibits no distension and no mass. There is no tenderness. There is no rebound and no guarding.  Musculoskeletal: She exhibits no edema.  Lymphadenopathy:    She has no cervical adenopathy.  Neurological: She is alert and oriented to person, place, and time. She has normal patellar reflexes. She exhibits normal muscle tone. Coordination normal.  Skin: Skin is warm and dry.  Psychiatric: She has a normal mood and affect. Her behavior is normal. Judgment and thought content normal.  Breast/pelvic: deferred         Assessment & Plan:       Assessment & Plan:   Problem List Items Addressed This Visit       High   Controlled type 2 diabetes mellitus without complication, without long-term current use of insulin  (HCC)   Lab Results  Component Value Date   HGBA1C 6.0 10/29/2023   HGBA1C 6.0 (H) 06/29/2023   HGBA1C 5.7 01/18/2023  Lab Results  Component Value Date   MICROALBUR 4.3 (H) 01/04/2024   LDLCALC 129 (H) 01/04/2024   CREATININE 0.97 01/04/2024    Had GI side effects on mounjaro - previously did better on Ozempic . Will change to ozempic . A1C stable.       Relevant Medications   Semaglutide ,0.25 or 0.5MG /DOS, (OZEMPIC , 0.25 OR 0.5 MG/DOSE,) 2 MG/3ML SOPN     Unprioritized   Vitamin D  deficiency   Update vit D level.       Relevant Orders   VITAMIN D  25 Hydroxy (Vit-D Deficiency, Fractures)   Preventative health care    Routine wellness visit. Emphasized importance of vaccinations due to husband's lung transplant history. - Administer Prevnar vaccine. - Recommend updating COVID booster in the fall. - Recommend annual flu shot. -pap/colo/mammo up to date       Hyperlipidemia   Maintained on atorvastatin . Update lipid panel.       Relevant Orders   Lipid panel   Family history of multiple myeloma   Relevant Orders   Protein Electrophoresis, (serum)   Depression   Mood stable on lexapro .       Other Visit Diagnoses       Type 2 diabetes mellitus with other specified complication, without long-term current use of insulin  (HCC)    -  Primary   Relevant Medications   Semaglutide ,0.25 or 0.5MG /DOS, (OZEMPIC , 0.25 OR 0.5 MG/DOSE,) 2 MG/3ML SOPN   Other Relevant Orders   HgB A1c   Basic Metabolic Panel (BMET)     Need for pneumococcal 20-valent conjugate vaccination       Relevant Orders   Pneumococcal conjugate vaccine 20-valent (Prevnar 20) (Completed)       I have discontinued Zulema D. Bitterman's tirzepatide  and Vitamin D  (Ergocalciferol ). I am also having her start on Ozempic  (0.25 or 0.5 MG/DOSE). Additionally, I am having her maintain her cyanocobalamin , Vitamin D , tretinoin , escitalopram , losartan , atorvastatin , and pneumococcal 21-valent conjugate vaccine.  Meds ordered this encounter  Medications   Semaglutide ,0.25 or 0.5MG /DOS, (OZEMPIC , 0.25 OR 0.5 MG/DOSE,) 2 MG/3ML SOPN    Sig: Inject 0.25 mg into the skin once a week.    Dispense:  3 mL    Refill:  2    Supervising Provider:   Randie Bustle A  [[id number]]

## 2024-04-13 ENCOUNTER — Other Ambulatory Visit (HOSPITAL_COMMUNITY): Payer: Self-pay

## 2024-04-13 ENCOUNTER — Encounter: Payer: Self-pay | Admitting: Family

## 2024-04-13 ENCOUNTER — Other Ambulatory Visit: Payer: Self-pay | Admitting: Family

## 2024-04-13 DIAGNOSIS — E559 Vitamin D deficiency, unspecified: Secondary | ICD-10-CM

## 2024-04-13 LAB — MICROALBUMIN / CREATININE URINE RATIO
Creatinine,U: 118.9 mg/dL
Microalb Creat Ratio: 9.9 mg/g (ref 0.0–30.0)
Microalb, Ur: 1.2 mg/dL (ref 0.0–1.9)

## 2024-04-13 MED ORDER — VITAMIN D3 75 MCG (3000 UT) PO TABS
1.0000 | ORAL_TABLET | Freq: Every day | ORAL | Status: AC
Start: 2024-04-13 — End: ?

## 2024-04-14 LAB — PROTEIN ELECTROPHORESIS, SERUM
Albumin ELP: 4 g/dL (ref 3.8–4.8)
Alpha 1: 0.3 g/dL (ref 0.2–0.3)
Alpha 2: 0.7 g/dL (ref 0.5–0.9)
Beta 2: 0.5 g/dL (ref 0.2–0.5)
Beta Globulin: 0.5 g/dL (ref 0.4–0.6)
Gamma Globulin: 1 g/dL (ref 0.8–1.7)
Total Protein: 6.9 g/dL (ref 6.1–8.1)

## 2024-04-17 ENCOUNTER — Encounter: Payer: Self-pay | Admitting: Family

## 2024-04-20 ENCOUNTER — Encounter: Payer: Self-pay | Admitting: Family

## 2024-04-20 ENCOUNTER — Other Ambulatory Visit (HOSPITAL_COMMUNITY): Payer: Self-pay

## 2024-04-24 DIAGNOSIS — H5203 Hypermetropia, bilateral: Secondary | ICD-10-CM | POA: Diagnosis not present

## 2024-04-24 LAB — HM DIABETES EYE EXAM

## 2024-05-08 ENCOUNTER — Other Ambulatory Visit (HOSPITAL_COMMUNITY): Payer: Self-pay

## 2024-05-17 ENCOUNTER — Telehealth: Payer: Self-pay

## 2024-05-17 ENCOUNTER — Other Ambulatory Visit (HOSPITAL_COMMUNITY): Payer: Self-pay

## 2024-05-17 ENCOUNTER — Other Ambulatory Visit: Payer: Self-pay

## 2024-05-17 DIAGNOSIS — E1169 Type 2 diabetes mellitus with other specified complication: Secondary | ICD-10-CM

## 2024-05-17 MED ORDER — OZEMPIC (0.25 OR 0.5 MG/DOSE) 2 MG/3ML ~~LOC~~ SOPN
0.5000 mg | PEN_INJECTOR | SUBCUTANEOUS | 1 refills | Status: DC
  Filled 2024-05-17: qty 3, fill #0
  Filled 2024-05-17: qty 3, 28d supply, fill #0
  Filled 2024-06-12: qty 3, 28d supply, fill #1

## 2024-05-17 NOTE — Telephone Encounter (Signed)
**Note De-identified  [place] Obfuscation** Please advise 

## 2024-05-17 NOTE — Telephone Encounter (Signed)
 Copied from CRM [id number]. Topic: General - Other >> May 17, 2024 11:05 AM Trula Gable C wrote: Reason for CRM: Patient called in regarding ozempic  stated she just completed 0.25 No issues and would like to increase to 0.5, would like to callback on what to do next

## 2024-05-19 ENCOUNTER — Encounter: Admitting: Family

## 2024-05-23 ENCOUNTER — Ambulatory Visit: Admitting: Dermatology

## 2024-05-23 ENCOUNTER — Encounter: Payer: Self-pay | Admitting: Dermatology

## 2024-05-23 DIAGNOSIS — D229 Melanocytic nevi, unspecified: Secondary | ICD-10-CM

## 2024-05-23 DIAGNOSIS — D1801 Hemangioma of skin and subcutaneous tissue: Secondary | ICD-10-CM

## 2024-05-23 DIAGNOSIS — L578 Other skin changes due to chronic exposure to nonionizing radiation: Secondary | ICD-10-CM

## 2024-05-23 DIAGNOSIS — D492 Neoplasm of unspecified behavior of bone, soft tissue, and skin: Secondary | ICD-10-CM

## 2024-05-23 DIAGNOSIS — W908XXA Exposure to other nonionizing radiation, initial encounter: Secondary | ICD-10-CM | POA: Diagnosis not present

## 2024-05-23 DIAGNOSIS — L814 Other melanin hyperpigmentation: Secondary | ICD-10-CM

## 2024-05-23 DIAGNOSIS — L821 Other seborrheic keratosis: Secondary | ICD-10-CM | POA: Diagnosis not present

## 2024-05-23 DIAGNOSIS — Z1283 Encounter for screening for malignant neoplasm of skin: Secondary | ICD-10-CM

## 2024-05-23 DIAGNOSIS — L820 Inflamed seborrheic keratosis: Secondary | ICD-10-CM

## 2024-05-23 DIAGNOSIS — D485 Neoplasm of uncertain behavior of skin: Secondary | ICD-10-CM

## 2024-05-23 NOTE — Patient Instructions (Signed)

## 2024-05-23 NOTE — Progress Notes (Signed)
   Follow-Up Visit   Subjective  Yvonne [name] is a 53 y.o. female who presents for the following: Skin Cancer Screening and Full Body Skin Exam  The patient presents for Total-Body Skin Exam (TBSE) for skin cancer screening and mole check. The patient has spots, moles and lesions to be evaluated, some may be new or changing.  Patient reports that there is a large flat mole on her back that is not changing but she would like examined.  Patient works from home and does not spend much time outside. She reports that when she was younger she lived in [place]  and spent time at the beach.   The following portions of the chart were reviewed this encounter and updated as appropriate: medications, allergies, medical history  Review of Systems:  No other skin or systemic complaints except as noted in HPI or Assessment and Plan.  Objective  Well appearing patient in no apparent distress; mood and affect are within normal limits.  A full examination was performed including scalp, head, eyes, ears, nose, lips, neck, chest, axillae, abdomen, back, buttocks, bilateral upper extremities, bilateral lower extremities, hands, feet, fingers, toes, fingernails, and toenails. All findings within normal limits unless otherwise noted below.   Relevant physical exam findings are noted in the Assessment and Plan.  Left Buttock 1.1cm brown-black plaque   Assessment & Plan   SKIN CANCER SCREENING PERFORMED TODAY.  ACTINIC DAMAGE - Chronic condition, secondary to cumulative UV/sun exposure - diffuse scaly erythematous macules with underlying dyspigmentation - Recommend daily broad spectrum sunscreen SPF 30+ to sun-exposed areas, reapply every 2 hours as needed.  - Staying in the shade or wearing long sleeves, sun glasses (UVA+UVB protection) and wide brim hats (4-inch brim around the entire circumference of the hat) are also recommended for sun protection.  - Call for new or changing lesions.  LENTIGINES,  SEBORRHEIC KERATOSES, HEMANGIOMAS - Benign normal skin lesions Left lower back Left parietal scalp - Benign-appearing - Call for any changes  MELANOCYTIC NEVI - Tan-brown and/or pink-flesh-colored symmetric macules and papules - Benign appearing on exam today - Observation - Call clinic for new or changing moles - Recommend daily use of broad spectrum spf 30+ sunscreen to sun-exposed areas.  NEOPLASM OF UNCERTAIN BEHAVIOR OF SKIN Left Buttock Skin / nail biopsy Type of biopsy: tangential   Informed consent: discussed and consent obtained   Timeout: patient name, date of birth, surgical site, and procedure verified   Procedure prep:  Patient was prepped and draped in usual sterile fashion Prep type:  Isopropyl alcohol Anesthesia: the lesion was anesthetized in a standard fashion   Anesthetic:  1% lidocaine  w/ epinephrine  1-100,000 buffered w/ 8.4% NaHCO3 Instrument used: DermaBlade   Outcome: patient tolerated procedure well   Post-procedure details: sterile dressing applied and wound care instructions given   Dressing type: bandage and petrolatum   Specimen 1 - Surgical pathology Differential Diagnosis: r/o SK vs other  Check Margins: No LENTIGINES   SEBORRHEIC KERATOSES   CHERRY ANGIOMA   ACTINIC SKIN DAMAGE   MULTIPLE BENIGN NEVI   Return pending pathology results.  Maurine Sovereign, RN, am acting as scribe for Deneise Finlay, MD .   Documentation: I have reviewed the above documentation for accuracy and completeness, and I agree with the above.  Deneise Finlay, MD

## 2024-05-25 ENCOUNTER — Ambulatory Visit: Payer: Self-pay | Admitting: Dermatology

## 2024-05-25 LAB — SURGICAL PATHOLOGY

## 2024-06-19 ENCOUNTER — Ambulatory Visit (INDEPENDENT_AMBULATORY_CARE_PROVIDER_SITE_OTHER): Admitting: Family Medicine

## 2024-06-19 ENCOUNTER — Encounter (INDEPENDENT_AMBULATORY_CARE_PROVIDER_SITE_OTHER): Payer: Self-pay | Admitting: Family Medicine

## 2024-06-19 VITALS — BP 124/86 | HR 94 | Temp 97.9°F | Ht 65.0 in | Wt 205.0 lb

## 2024-06-19 DIAGNOSIS — F5089 Other specified eating disorder: Secondary | ICD-10-CM

## 2024-06-19 DIAGNOSIS — Z6834 Body mass index (BMI) 34.0-34.9, adult: Secondary | ICD-10-CM

## 2024-06-19 DIAGNOSIS — Z7985 Long-term (current) use of injectable non-insulin antidiabetic drugs: Secondary | ICD-10-CM

## 2024-06-19 DIAGNOSIS — E669 Obesity, unspecified: Secondary | ICD-10-CM | POA: Diagnosis not present

## 2024-06-19 DIAGNOSIS — E119 Type 2 diabetes mellitus without complications: Secondary | ICD-10-CM

## 2024-06-19 DIAGNOSIS — F3289 Other specified depressive episodes: Secondary | ICD-10-CM

## 2024-06-19 DIAGNOSIS — E1169 Type 2 diabetes mellitus with other specified complication: Secondary | ICD-10-CM

## 2024-06-19 DIAGNOSIS — Z6833 Body mass index (BMI) 33.0-33.9, adult: Secondary | ICD-10-CM

## 2024-06-19 NOTE — Progress Notes (Signed)
 Office: [phone]  /  Fax: [phone]  WEIGHT SUMMARY AND BIOMETRICS  Anthropometric Measurements Height: 5' 5 (1.651 m) Weight: 205 lb (93 kg) BMI (Calculated): 34.11 Weight at Last Visit: 194 lb Weight Lost Since Last Visit: 0 Weight Gained Since Last Visit: 11 lb Starting Weight: 204 lb Peak Weight: 220 lb   Body Composition  Body Fat %: 41.2 % Fat Mass (lbs): 84.8 lbs Muscle Mass (lbs): 114.6 lbs Total Body Water (lbs): 77.4 lbs Visceral Fat Rating : 11   Other Clinical Data Fasting: yes Labs: no Today's Visit #: 72 Starting Date: 05/17/17    Chief Complaint: OBESITY    History of Present Illness Yvonne [name] is a 53 year old female with obesity and type 2 diabetes who presents for obesity treatment and assessment of her progress.  She has been managing her obesity and type 2 diabetes with medication. Initially, she was on Mounjaro  but discontinued it due to significant gastrointestinal side effects, including diarrhea and vomiting. She has since transitioned to Ozempic , currently at a dose of 0.5 mg, and has completed three doses without issues. However, she continues to struggle with excessive hunger.  Her husband's recent lung transplant and the need for her to travel between [place]  and her home for his medical care have impacted her ability to focus on her eating habits. This disruption has made it challenging to maintain a consistent routine. Despite these challenges, her recent lab work showed good results, including cholesterol and A1c levels.  She works from home and incorporates daily walking into her routine, including walking her dog. She snacks on low-calorie foods like pickles, cucumbers, and cauliflower but struggles with cravings for junk food, especially under stress. She is currently on Lexapro  and has previously discontinued Wellbutrin  due to side effects.  Her social history includes working from home to accommodate her husband's  medical needs and her daughter's virtual schooling. She avoids gyms to minimize exposure to germs due to her husband's compromised immune system and plans to incorporate weights into her walking routine to enhance her physical activity.      PHYSICAL EXAM:  Blood pressure 124/86, pulse 94, temperature 97.9 F (36.6 C), height 5' 5 (1.651 m), weight 205 lb (93 kg), last menstrual period 12/05/2021, SpO2 98%. Body mass index is 34.11 kg/m.  DIAGNOSTIC DATA REVIEWED:  BMET    Component Value Date/Time   NA 141 04/12/2024 1157   NA 141 06/29/2023 0829   K 4.7 04/12/2024 1157   CL 107 04/12/2024 1157   CO2 28 04/12/2024 1157   GLUCOSE 91 04/12/2024 1157   BUN 17 04/12/2024 1157   BUN 24 06/29/2023 0829   CREATININE 0.89 04/12/2024 1157   CREATININE 0.85 04/28/2019 1457   CALCIUM  10.3 04/12/2024 1157   GFRNONAA 58 (L) 12/06/2021 0807   GFRAA 95 01/08/2021 0740   Lab Results  Component Value Date   HGBA1C 5.9 04/12/2024   HGBA1C 5.7 09/14/2011   Lab Results  Component Value Date   INSULIN  51.4 (H) 06/29/2023   INSULIN  27.9 (H) 05/17/2017   Lab Results  Component Value Date   TSH 1.140 06/29/2023   CBC    Component Value Date/Time   WBC 8.1 06/29/2023 0829   WBC 8.8 01/12/2022 0940   RBC 4.41 06/29/2023 0829   RBC 3.87 01/12/2022 0940   HGB 13.7 06/29/2023 0829   HCT 40.5 06/29/2023 0829   PLT 311 06/29/2023 0829   MCV 92 06/29/2023 0829   MCH  31.1 06/29/2023 0829   MCH 31.3 12/07/2021 0711   MCHC 33.8 06/29/2023 0829   MCHC 33.1 01/12/2022 0940   RDW 13.9 06/29/2023 0829   Iron Studies    Component Value Date/Time   IRON 77 01/12/2022 0940   FERRITIN 131.2 01/12/2022 0940   Lipid Panel     Component Value Date/Time   CHOL 159 04/12/2024 1157   CHOL 204 (H) 06/29/2023 0829   TRIG 56.0 04/12/2024 1157   HDL 73.20 04/12/2024 1157   HDL 82 06/29/2023 0829   CHOLHDL 2 04/12/2024 1157   VLDL 11.2 04/12/2024 1157   LDLCALC 74 04/12/2024 1157   LDLCALC  108 (H) 06/29/2023 0829   LDLCALC 127 (H) 04/28/2019 1457   Hepatic Function Panel     Component Value Date/Time   PROT 6.9 04/12/2024 1157   PROT 6.7 06/29/2023 0829   ALBUMIN  4.1 06/29/2023 0829   AST 16 06/29/2023 0829   ALT 16 06/29/2023 0829   ALKPHOS 142 (H) 06/29/2023 0829   BILITOT <0.2 06/29/2023 0829   BILIDIR 0.1 01/18/2023 0739   IBILI 0.3 04/28/2019 1457      Component Value Date/Time   TSH 1.140 06/29/2023 0829   Nutritional Lab Results  Component Value Date   VD25OH 49.12 04/12/2024   VD25OH 19.31 (L) 10/29/2023   VD25OH 24.4 (L) 06/29/2023     Assessment and Plan Assessment & Plan Obesity Currently on Ozempic  0.5 mg for weight management after significant gastrointestinal side effects with Mounjaro . On her third dose of Ozempic  without issues. Incorporating more fresh fruits and vegetables into her diet and engaging in regular walking exercises. Plans to add weights to enhance muscle usage. Excessive hunger not yet under control, expected to improve after four doses of Ozempic . Likely to increase to 1 mg after follow-up in August. - Continue Ozempic  0.5 mg and monitor for side effects - Encourage regular walking and consider adding weights to the exercise routine - Discuss dietary options and meal planning strategies, including using ChatGPT for low-carb meal ideas  Type 2 Diabetes Mellitus Managed with Ozempic . Recent lab work, including A1c, has been good. Maintaining a low-carb diet to manage blood glucose levels, focusing on protein and vegetables. Aware of the need to avoid foods that stimulate insulin  production. Follow-up in August for further evaluation and potential adjustment of Ozempic  dosage. - Maintain a low-carb diet with a focus on protein and vegetables - Follow up in August for further evaluation and potential adjustment of Ozempic  dosage  Emotional Eating Reports emotional eating behaviors, particularly during stress. On Lexapro  20 mg for  mood management. Stress from husband's health issues and other personal matters may contribute to emotional eating. Not on Wellbutrin  due to previous side effects. Increasing Lexapro  dosage is an option, but there is a risk of serotonin syndrome, which presents with muscle rigidity and mental confusion. Sensitive to medications, so changes will be considered cautiously. - Continue Lexapro  20 mg and monitor mood - Consider dietary modifications and meal planning to address cravings and excessive hunger     I have personally spent 30 minutes total time today in preparation, patient care, and documentation for this visit, including the following: review of clinical lab tests; review of medical history, review of eating habits and nutritional counseling   She was informed of the importance of frequent follow up visits to maximize her success with intensive lifestyle modifications for her multiple health conditions.    Louann Penton, MD

## 2024-07-01 ENCOUNTER — Other Ambulatory Visit: Payer: Self-pay | Admitting: Family

## 2024-07-01 DIAGNOSIS — I10 Essential (primary) hypertension: Secondary | ICD-10-CM

## 2024-07-03 ENCOUNTER — Other Ambulatory Visit (HOSPITAL_COMMUNITY): Payer: Self-pay

## 2024-07-03 ENCOUNTER — Other Ambulatory Visit (HOSPITAL_BASED_OUTPATIENT_CLINIC_OR_DEPARTMENT_OTHER): Payer: Self-pay

## 2024-07-03 MED ORDER — ATORVASTATIN CALCIUM 40 MG PO TABS
40.0000 mg | ORAL_TABLET | Freq: Every day | ORAL | 0 refills | Status: DC
  Filled 2024-07-03: qty 90, 90d supply, fill #0

## 2024-07-03 MED ORDER — LOSARTAN POTASSIUM 25 MG PO TABS
25.0000 mg | ORAL_TABLET | Freq: Every day | ORAL | 0 refills | Status: DC
  Filled 2024-07-03: qty 90, 90d supply, fill #0

## 2024-07-12 ENCOUNTER — Other Ambulatory Visit (HOSPITAL_COMMUNITY): Payer: Self-pay

## 2024-07-12 ENCOUNTER — Telehealth: Payer: Self-pay | Admitting: Family

## 2024-07-12 ENCOUNTER — Other Ambulatory Visit: Payer: Self-pay

## 2024-07-12 ENCOUNTER — Ambulatory Visit: Admitting: Family

## 2024-07-12 VITALS — BP 119/80 | HR 92 | Temp 98.8°F | Resp 16 | Ht 65.0 in | Wt 210.0 lb

## 2024-07-12 DIAGNOSIS — E782 Mixed hyperlipidemia: Secondary | ICD-10-CM | POA: Diagnosis not present

## 2024-07-12 DIAGNOSIS — Z7985 Long-term (current) use of injectable non-insulin antidiabetic drugs: Secondary | ICD-10-CM | POA: Diagnosis not present

## 2024-07-12 DIAGNOSIS — E1169 Type 2 diabetes mellitus with other specified complication: Secondary | ICD-10-CM | POA: Diagnosis not present

## 2024-07-12 DIAGNOSIS — F3289 Other specified depressive episodes: Secondary | ICD-10-CM | POA: Diagnosis not present

## 2024-07-12 DIAGNOSIS — Z6834 Body mass index (BMI) 34.0-34.9, adult: Secondary | ICD-10-CM

## 2024-07-12 DIAGNOSIS — E119 Type 2 diabetes mellitus without complications: Secondary | ICD-10-CM

## 2024-07-12 DIAGNOSIS — E66811 Obesity, class 1: Secondary | ICD-10-CM

## 2024-07-12 DIAGNOSIS — E559 Vitamin D deficiency, unspecified: Secondary | ICD-10-CM | POA: Diagnosis not present

## 2024-07-12 DIAGNOSIS — Z0184 Encounter for antibody response examination: Secondary | ICD-10-CM | POA: Diagnosis not present

## 2024-07-12 DIAGNOSIS — I10 Essential (primary) hypertension: Secondary | ICD-10-CM

## 2024-07-12 LAB — BASIC METABOLIC PANEL WITH GFR
BUN: 12 mg/dL (ref 6–23)
CO2: 27 meq/L (ref 19–32)
Calcium: 9.8 mg/dL (ref 8.4–10.5)
Chloride: 108 meq/L (ref 96–112)
Creatinine, Ser: 0.86 mg/dL (ref 0.40–1.20)
GFR: 77.99 mL/min (ref >60.00)
Glucose, Bld: 96 mg/dL (ref 70–99)
Potassium: 4 meq/L (ref 3.5–5.1)
Sodium: 142 meq/L (ref 135–145)

## 2024-07-12 LAB — HEMOGLOBIN A1C: Hgb A1c MFr Bld: 6.2 % (ref 4.6–6.5)

## 2024-07-12 MED ORDER — ATORVASTATIN CALCIUM 40 MG PO TABS
40.0000 mg | ORAL_TABLET | Freq: Every day | ORAL | 1 refills | Status: AC
  Filled 2024-07-12 – 2024-09-30 (×2): qty 90, 90d supply, fill #0
  Filled 2024-12-27: qty 90, 90d supply, fill #1

## 2024-07-12 MED ORDER — SEMAGLUTIDE (1 MG/DOSE) 4 MG/3ML ~~LOC~~ SOPN
1.0000 mg | PEN_INJECTOR | SUBCUTANEOUS | 0 refills | Status: DC
  Filled 2024-07-12 – 2024-08-04 (×3): qty 3, 28d supply, fill #0
  Filled 2024-08-25 – 2024-08-28 (×2): qty 3, 28d supply, fill #1

## 2024-07-12 MED ORDER — ESCITALOPRAM OXALATE 20 MG PO TABS
20.0000 mg | ORAL_TABLET | Freq: Every day | ORAL | 1 refills | Status: DC
  Filled 2024-07-12 – 2024-08-23 (×2): qty 90, 90d supply, fill #0
  Filled 2024-11-19: qty 90, 90d supply, fill #1

## 2024-07-12 MED ORDER — LOSARTAN POTASSIUM 25 MG PO TABS
25.0000 mg | ORAL_TABLET | Freq: Every day | ORAL | 1 refills | Status: AC
  Filled 2024-07-12 – 2024-10-03 (×2): qty 90, 90d supply, fill #0
  Filled 2025-01-01: qty 90, 90d supply, fill #1

## 2024-07-12 NOTE — Assessment & Plan Note (Addendum)
 Lab Results  Component Value Date   HGBA1C 5.9 04/12/2024   HGBA1C 6.0 10/29/2023   HGBA1C 6.0 (H) 06/29/2023   Lab Results  Component Value Date   MICROALBUR 1.2 04/12/2024   LDLCALC 74 04/12/2024   CREATININE 0.89 04/12/2024   A1C at goal. Update A1C, continues ozempic . Weight is up.  Increased huger. Will increase Ozempic  from 0.5mg  to 1.0 mg.

## 2024-07-12 NOTE — Assessment & Plan Note (Signed)
Mood stable on lexapro, continue same.  

## 2024-07-12 NOTE — Assessment & Plan Note (Signed)
 Lab Results  Component Value Date   CHOL 159 04/12/2024   HDL 73.20 04/12/2024   LDLCALC 74 04/12/2024   TRIG 56.0 04/12/2024   CHOLHDL 2 04/12/2024   Almost at goal LDL <70.  Continue to work on diet. Continue lipitor.

## 2024-07-12 NOTE — Progress Notes (Signed)
 Subjective:     Patient ID: Yvonne [name], female    DOB: 02-27-1971, 53 y.o.   MRN: [id number]  Chief Complaint  Patient presents with   Diabetes    Here for follow up   Hypertension    Here for follow up    HPI  Discussed the use of AI scribe software for clinical note transcription with the patient, who gave verbal consent to proceed.  History of Present Illness  Yvonne [name] is a 53 year old female with type 2 diabetes who presents for a routine follow-up.  She manages her type 2 diabetes with Ozempic  0.5 mg but has noticed increased hunger over the past two weeks, raising concerns about the medication's effectiveness. Her last A1c was 5.9%. She has experienced a slight weight increase and is concerned about managing her hunger. Lexapro  helps with her mood and slightly with hunger. She takes losartan  25 mg for blood pressure and atorvastatin  for cholesterol, and follows a heart-healthy diet.  She is pleased that her husband is doing very well following his lung transplant.      Health Maintenance Due  Topic Date Due   Hepatitis B Vaccines (1 of 3 - 19+ 3-dose series) Never done   COVID-19 Vaccine (4 - 2024-25 season) 08/08/2023   OPHTHALMOLOGY EXAM  04/01/2024   INFLUENZA VACCINE  07/07/2024    Past Medical History:  Diagnosis Date   Anemia    Anxiety    Arthritis    Borderline type 2 diabetes mellitus    High blood pressure    Joint pain    Kidney stones    Microscopic hematuria    Ovarian torsion 2022   Vitamin D  deficiency     Past Surgical History:  Procedure Laterality Date   COLONOSCOPY  01/26/2023   LAPAROTOMY N/A 12/02/2021   Procedure: EXPLORATORY LAPAROTOMY,REMOVAL OF RIGHT TUBE AND OVARY;  Surgeon: Yvonne Comer SAUNDERS, MD;  Location: [facility];  Service: Gynecology;  Laterality: N/A;    Family History  Problem Relation Age of Onset   Other Mother        deceased from multiple myelomas   Cancer Mother    Healthy Father    Congestive  Heart Failure Father    Heart disease Father        CABG   Diabetes Father    Coronary artery disease Father    Cancer Maternal Grandmother    Multiple myeloma Maternal Grandmother    Cancer Maternal Grandfather        unsure what type of cancer   Diabetes Paternal Grandmother    Prostate cancer Paternal Grandmother    Colon cancer Neg Hx    Esophageal cancer Neg Hx    Liver cancer Neg Hx    Pancreatic cancer Neg Hx    Rectal cancer Neg Hx    Stomach cancer Neg Hx     Social History   Socioeconomic History   Marital status: Married    Spouse name: Not on file   Number of children: 3   Years of education: Not on file   Highest education level: Associate degree: academic program  Occupational History   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    Employer: [employer]  Tobacco Use   Smoking status: Never   Smokeless tobacco: Never  Vaping Use   Vaping status: Never Used  Substance and Sexual Activity   Alcohol use: No   Drug use: No   Sexual activity: Yes    Partners:  Male    Birth control/protection: Post-menopausal  Other Topics Concern   Not on file  Social History Narrative   5 children (3 are out of the house) 3 biological and 2 step   Married to [name]   Works in patient Access at [name]   12/16/21 lives with husband one daughter at home who is 15 yr            Social Drivers of Corporate investment banker Strain: Low Risk  (07/12/2024)   Overall Financial Resource Strain (CARDIA)    Difficulty of Paying Living Expenses: Not hard at all  Food Insecurity: No Food Insecurity (07/12/2024)   Hunger Vital Sign    Worried About Running Out of Food in the Last Year: Never true    Ran Out of Food in the Last Year: Never true  Transportation Needs: No Transportation Needs (07/12/2024)   PRAPARE - Administrator, Civil Service (Medical): No    Lack of Transportation (Non-Medical): No  Physical Activity: Sufficiently Active (07/12/2024)   Exercise  Vital Sign    Days of Exercise per Week: 7 days    Minutes of Exercise per Session: 30 min  Stress: No Stress Concern Present (07/12/2024)   [name] of Occupational Health - Occupational Stress Questionnaire    Feeling of Stress: Only a little  Social Connections: Socially Integrated (07/12/2024)   Social Connection and Isolation Panel    Frequency of Communication with Friends and Family: Three times a week    Frequency of Social Gatherings with Friends and Family: Once a week    Attends Religious Services: More than 4 times per year    Active Member of [name] or Organizations: Yes    Attends Banker Meetings: 1 to 4 times per year    Marital Status: Married  Catering manager Violence: Not on file    Outpatient Medications Prior to Visit  Medication Sig Dispense Refill   Cholecalciferol (VITAMIN D3) 75 MCG (3000 UT) TABS Take 1 tablet by mouth daily at 6 (six) AM.     cyanocobalamin  (VITAMIN B12) 1000 MCG tablet Take 1 tablet (1,000 mcg total) by mouth daily.     tretinoin  (RETIN-A ) 0.025 % cream Apply topically at bedtime. Apply on M-W-F 45 g 4   atorvastatin  (LIPITOR) 40 MG tablet Take 1 tablet (40 mg total) by mouth daily. 90 tablet 0   escitalopram  (LEXAPRO ) 20 MG tablet Take 1 tablet (20 mg total) by mouth daily. 90 tablet 1   losartan  (COZAAR ) 25 MG tablet Take 1 tablet (25 mg total) by mouth daily. 90 tablet 0   Semaglutide ,0.25 or 0.5MG /DOS, (OZEMPIC , 0.25 OR 0.5 MG/DOSE,) 2 MG/3ML SOPN Inject 0.5 mg into the skin once a week. 3 mL 1   pneumococcal 21-valent conjugate vaccine (CAPVAXIVE ) 0.5 ML injection Inject into the muscle. 0.5 mL 0   No facility-administered medications prior to visit.    Allergies  Allergen Reactions   Red Blood Cells     Jehovah's Witness, does not want to receive blood    ROS    See HPI Objective:    Physical Exam Constitutional:      General: She is not in acute distress.    Appearance: Normal appearance. She is  well-developed.  HENT:     Head: Normocephalic and atraumatic.     Right Ear: External ear normal.     Left Ear: External ear normal.  Eyes:     General:  No scleral icterus. Neck:     Thyroid : No thyromegaly.  Cardiovascular:     Rate and Rhythm: Normal rate and regular rhythm.     Heart sounds: Normal heart sounds. No murmur heard. Pulmonary:     Effort: Pulmonary effort is normal. No respiratory distress.     Breath sounds: Normal breath sounds. No wheezing.  Musculoskeletal:     Cervical back: Neck supple.  Skin:    General: Skin is warm and dry.  Neurological:     Mental Status: She is alert and oriented to person, place, and time.  Psychiatric:        Mood and Affect: Mood normal.        Behavior: Behavior normal.        Thought Content: Thought content normal.        Judgment: Judgment normal.      BP 119/80 (BP Location: Right Arm, Patient Position: Sitting, Cuff Size: Large)   Pulse 92   Temp 98.8 F (37.1 C) (Oral)   Resp 16   Ht 5' 5 (1.651 m)   Wt 210 lb (95.3 kg)   LMP 12/05/2021   SpO2 97%   BMI 34.95 kg/m  Wt Readings from Last 3 Encounters:  07/12/24 210 lb (95.3 kg)  06/19/24 205 lb (93 kg)  04/12/24 201 lb (91.2 kg)       Assessment & Plan:   Problem List Items Addressed This Visit       High   Diabetes mellitus (HCC) - Primary   Lab Results  Component Value Date   HGBA1C 5.9 04/12/2024   HGBA1C 6.0 10/29/2023   HGBA1C 6.0 (H) 06/29/2023   Lab Results  Component Value Date   MICROALBUR 1.2 04/12/2024   LDLCALC 74 04/12/2024   CREATININE 0.89 04/12/2024   A1C at goal. Update A1C, continues ozempic . Weight is up.  Increased huger. Will increase Ozempic  from 0.5mg  to 1.0 mg.       Relevant Medications   Semaglutide , 1 MG/DOSE, 4 MG/3ML SOPN   losartan  (COZAAR ) 25 MG tablet   atorvastatin  (LIPITOR) 40 MG tablet   Other Relevant Orders   HgB A1c     Unprioritized   Vitamin D  deficiency   Maintained on vit D 3000 international  units otc daily. Last Vit D was normal.      Hypertension   BP Readings from Last 3 Encounters:  07/12/24 119/80  06/19/24 124/86  04/12/24 115/84   Continues losartan  25mg . BP stable. Continue same.      Relevant Medications   losartan  (COZAAR ) 25 MG tablet   atorvastatin  (LIPITOR) 40 MG tablet   Other Relevant Orders   Basic Metabolic Panel (BMET)   Hyperlipidemia   Lab Results  Component Value Date   CHOL 159 04/12/2024   HDL 73.20 04/12/2024   LDLCALC 74 04/12/2024   TRIG 56.0 04/12/2024   CHOLHDL 2 04/12/2024   Almost at goal LDL <70.  Continue to work on diet. Continue lipitor.       Relevant Medications   losartan  (COZAAR ) 25 MG tablet   atorvastatin  (LIPITOR) 40 MG tablet   Depression   Mood stable on lexapro , continue same.       Relevant Medications   escitalopram  (LEXAPRO ) 20 MG tablet   Class 1 obesity without serious comorbidity with body mass index (BMI) of 34.0 to 34.9 in adult   Continues to work with [facility] and wellness and work on diet. Increase ozempic  as noted.  Relevant Medications   Semaglutide , 1 MG/DOSE, 4 MG/3ML SOPN   Other Visit Diagnoses       Immunity status testing       Relevant Orders   Hepatitis B surface antibody,quantitative       I have discontinued Aydia D. Goren's pneumococcal 21-valent conjugate vaccine and Ozempic  (0.25 or 0.5 MG/DOSE). I am also having her start on Semaglutide  (1 MG/DOSE). Additionally, I am having her maintain her cyanocobalamin , tretinoin , Vitamin D3, escitalopram , losartan , and atorvastatin .  Meds ordered this encounter  Medications   Semaglutide , 1 MG/DOSE, 4 MG/3ML SOPN    Sig: Inject 1 mg as directed once a week.    Dispense:  9 mL    Refill:  0    Supervising Provider:   DOMENICA BLACKBIRD A [[id number]]   escitalopram  (LEXAPRO ) 20 MG tablet    Sig: Take 1 tablet (20 mg total) by mouth daily.    Dispense:  90 tablet    Refill:  1    Supervising Provider:   DOMENICA BLACKBIRD A [[id number]]    losartan  (COZAAR ) 25 MG tablet    Sig: Take 1 tablet (25 mg total) by mouth daily.    Dispense:  90 tablet    Refill:  1    Supervising Provider:   DOMENICA BLACKBIRD A [[id number]]   atorvastatin  (LIPITOR) 40 MG tablet    Sig: Take 1 tablet (40 mg total) by mouth daily.    Dispense:  90 tablet    Refill:  1    Supervising Provider:   DOMENICA BLACKBIRD A [[id number]]

## 2024-07-12 NOTE — Telephone Encounter (Signed)
 Electronic request sent

## 2024-07-12 NOTE — Assessment & Plan Note (Signed)
 Maintained on vit D 3000 international units otc daily. Last Vit D was normal.

## 2024-07-12 NOTE — Patient Instructions (Signed)
 VISIT SUMMARY:  Today, we reviewed your type 2 diabetes management, and we decided to adjust your Ozempic  dose due to increased hunger. We also discussed your cholesterol, blood pressure, mood, and vitamin D  levels, and made sure your medications are up to date.  YOUR PLAN:  TYPE 2 DIABETES MELLITUS: Your diabetes is well-managed with an A1c of 5.9%, but you've noticed increased hunger recently. -Increase Ozempic  dose to 1 mg with a 90-day supply.  HYPERLIPIDEMIA: Your cholesterol levels are near target with your current medication. -Continue taking atorvastatin . -Keep following a heart-healthy diet.  HYPERTENSION: Your blood pressure is well-controlled with your current medication. -Continue taking losartan  25 mg. -Refill for losartan  has been sent.  DEPRESSION: Your mood is stable with Lexapro , which also helps with your appetite. -Continue taking Lexapro . -Ensure you have refills for Lexapro .  VITAMIN D  DEFICIENCY: Your vitamin D  levels are stable with your current supplementation. -Continue taking vitamin D  3000 IU daily.

## 2024-07-12 NOTE — Telephone Encounter (Signed)
 Please request DM eye from [facility] [address]

## 2024-07-12 NOTE — Assessment & Plan Note (Signed)
 BP Readings from Last 3 Encounters:  07/12/24 119/80  06/19/24 124/86  04/12/24 115/84   Continues losartan  25mg . BP stable. Continue same.

## 2024-07-12 NOTE — Assessment & Plan Note (Signed)
 Continues to work with [facility] and wellness and work on diet. Increase ozempic  as noted.

## 2024-07-13 LAB — HEPATITIS B SURFACE ANTIBODY, QUANTITATIVE: Hep B S AB Quant (Post): >1000 m[IU]/mL (ref >=10)

## 2024-07-14 ENCOUNTER — Other Ambulatory Visit (HOSPITAL_COMMUNITY): Payer: Self-pay

## 2024-07-14 ENCOUNTER — Ambulatory Visit: Payer: Self-pay | Admitting: Family

## 2024-07-16 ENCOUNTER — Other Ambulatory Visit (HOSPITAL_COMMUNITY): Payer: Self-pay

## 2024-08-04 ENCOUNTER — Other Ambulatory Visit (HOSPITAL_COMMUNITY): Payer: Self-pay

## 2024-08-21 ENCOUNTER — Other Ambulatory Visit (HOSPITAL_COMMUNITY): Payer: Self-pay

## 2024-08-22 ENCOUNTER — Other Ambulatory Visit (HOSPITAL_COMMUNITY): Payer: Self-pay

## 2024-08-22 MED ORDER — FLUZONE 0.5 ML IM SUSY
PREFILLED_SYRINGE | INTRAMUSCULAR | 0 refills | Status: DC
  Filled 2024-08-28: qty 0.5, 1d supply, fill #0

## 2024-08-22 MED ORDER — AREXVY 120 MCG/0.5ML IM SUSR
INTRAMUSCULAR | 0 refills | Status: DC
  Filled 2024-08-28: qty 0.5, 1d supply, fill #0

## 2024-08-22 MED ORDER — COVID-19 MRNA VAC-TRIS(PFIZER) 30 MCG/0.3ML IM SUSY
PREFILLED_SYRINGE | INTRAMUSCULAR | 0 refills | Status: DC
  Filled 2024-08-28: qty 0.3, 1d supply, fill #0

## 2024-08-23 ENCOUNTER — Other Ambulatory Visit (HOSPITAL_COMMUNITY): Payer: Self-pay

## 2024-08-25 ENCOUNTER — Other Ambulatory Visit (HOSPITAL_COMMUNITY): Payer: Self-pay

## 2024-08-28 ENCOUNTER — Other Ambulatory Visit (HOSPITAL_COMMUNITY): Payer: Self-pay

## 2024-08-30 ENCOUNTER — Other Ambulatory Visit: Payer: Self-pay

## 2024-08-30 ENCOUNTER — Ambulatory Visit: Admitting: Family

## 2024-08-30 ENCOUNTER — Other Ambulatory Visit (HOSPITAL_COMMUNITY): Payer: Self-pay

## 2024-08-30 VITALS — BP 134/85 | HR 94 | Temp 98.4°F | Resp 16 | Ht 65.0 in | Wt 218.0 lb

## 2024-08-30 DIAGNOSIS — E66811 Obesity, class 1: Secondary | ICD-10-CM

## 2024-08-30 DIAGNOSIS — Z6834 Body mass index (BMI) 34.0-34.9, adult: Secondary | ICD-10-CM

## 2024-08-30 DIAGNOSIS — E1169 Type 2 diabetes mellitus with other specified complication: Secondary | ICD-10-CM

## 2024-08-30 MED ORDER — TIRZEPATIDE 2.5 MG/0.5ML ~~LOC~~ SOAJ
2.5000 mg | SUBCUTANEOUS | 0 refills | Status: DC
  Filled 2024-08-30: qty 2, 28d supply, fill #0

## 2024-08-30 NOTE — Patient Instructions (Signed)
 VISIT SUMMARY:  During your visit, we discussed the effectiveness and side effects of your current Ozempic  treatment for weight management. We decided to switch back to Mounjaro  at a lower dose to better manage your weight and minimize gastrointestinal side effects.  YOUR PLAN:  OBESITY AND WEIGHT MANAGEMENT: Your current Ozempic  1 mg treatment is not effectively suppressing your appetite or managing your weight. You previously had better results with Mounjaro  but experienced severe gastrointestinal side effects at higher doses. -Start Mounjaro  2.5 mg for one month. -Send a note in three weeks to update on your condition and weight status. -If tolerated, increase Mounjaro  to 5 mg next month. -Continue your low-carb diet and regular exercise. -Communicate with your provider regarding Mounjaro  dosing. -Hold Ozempic  1 mg unless Mounjaro  authorization is delayed beyond two days.  GASTROINTESTINAL SIDE EFFECTS FROM GLP-1 AGONIST THERAPY: You have been experiencing vomiting and diarrhea, likely due to your GLP-1 agonist therapy, which were more severe with higher doses of Mounjaro . -Monitor gastrointestinal symptoms with the reintroduction of Mounjaro  at a lower dose. -Avoid rapid dose increases of Mounjaro  to prevent worsening symptoms.

## 2024-08-30 NOTE — Assessment & Plan Note (Signed)
 Lab Results  Component Value Date   HGBA1C 6.2 07/12/2024   A1C is stable, but not seeing weight loss on ozempic  like she did on mounjaro .  Mounjaro  previously effective but discontinued due to GI side effects when she started the 7.5mg  dose. She did well on the 2.5mg  and 5mg  dosing. Low-carb diet and regular exercise maintained. - Initiate Mounjaro  2.5 mg for one month. Stop Ozempic . - Pt advised to send note in three weeks regarding med tolerance and weight status. - If tolerated, increase Mounjaro  to 5 mg next month.

## 2024-08-30 NOTE — Progress Notes (Signed)
 Subjective:     Patient ID: Yvonne [name], female    DOB: [birth year], 53 y.o.   MRN: [id number]  Chief Complaint  Patient presents with   Diabetes    Here to discuss medication    Diabetes    Discussed the use of AI scribe software for clinical note transcription with the patient, who gave verbal consent to proceed.  History of Present Illness  Yvonne [name] is a 53 year old female who presents with concerns regarding the effectiveness and side effects of her current Ozempic  treatment for weight management.  She is on Ozempic  1 mg but experiences constant hunger and weight gain, indicating ineffectiveness in appetite suppression. She experiences vomiting and diarrhea two to three times a week when feeling too full, which she manages by eating very small portions.  Previously, she was on Mounjaro , discontinued at 7.5 mg due to severe vomiting and diarrhea. At 5 mg, she did not experience these side effects and had better weight loss results compared to Ozempic .  Her diet is low-carb, focusing on proteins, vegetables, and low-sugar fruits. She is on Lexapro , which helps with hunger. She is scheduled to see a healthy weight and wellness clinic in October and follows a meal plan provided by her.  Her exercise routine includes daily walks with her dog and climbing 15-16 stairs regularly, which she finds challenging but maintains as part of her physical activity.     There are no preventive care reminders to display for this patient.  Past Medical History:  Diagnosis Date   Anemia    Anxiety    Arthritis    Borderline type 2 diabetes mellitus    High blood pressure    Joint pain    Kidney stones    Microscopic hematuria    Ovarian torsion 2022   Vitamin D  deficiency     Past Surgical History:  Procedure Laterality Date   COLONOSCOPY  01/26/2023   LAPAROTOMY N/A 12/02/2021   Procedure: EXPLORATORY LAPAROTOMY,REMOVAL OF RIGHT TUBE AND OVARY;  Surgeon: Viktoria Comer SAUNDERS, MD;  Location: [facility];  Service: Gynecology;  Laterality: N/A;    Family History  Problem Relation Age of Onset   Other Mother        deceased from multiple myelomas   Cancer Mother    Healthy Father    Congestive Heart Failure Father    Heart disease Father        CABG   Diabetes Father    Coronary artery disease Father    Cancer Maternal Grandmother    Multiple myeloma Maternal Grandmother    Cancer Maternal Grandfather        unsure what type of cancer   Diabetes Paternal Grandmother    Prostate cancer Paternal Grandmother    Colon cancer Neg Hx    Esophageal cancer Neg Hx    Liver cancer Neg Hx    Pancreatic cancer Neg Hx    Rectal cancer Neg Hx    Stomach cancer Neg Hx     Social History   Socioeconomic History   Marital status: Married    Spouse name: Not on file   Number of children: 3   Years of education: Not on file   Highest education level: Associate degree: academic program  Occupational History   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    Employer: [employer]  Tobacco Use   Smoking status: Never   Smokeless tobacco: Never  Vaping Use   Vaping status: Never Used  Substance and Sexual Activity   Alcohol use: No   Drug use: No   Sexual activity: Yes    Partners: Male    Birth control/protection: Post-menopausal  Other Topics Concern   Not on file  Social History Narrative   5 children (3 are out of the house) 3 biological and 2 step   Married to [name]   Works in patient Access at [name]   12/16/21 lives with husband one daughter at home who is 15 yr            Social Drivers of Corporate investment banker Strain: Low Risk  (07/12/2024)   Overall Financial Resource Strain (CARDIA)    Difficulty of Paying Living Expenses: Not hard at all  Food Insecurity: No Food Insecurity (07/12/2024)   Hunger Vital Sign    Worried About Running Out of Food in the Last Year: Never true    Ran Out of Food in the Last Year: Never true   Transportation Needs: No Transportation Needs (07/12/2024)   PRAPARE - Administrator, Civil Service (Medical): No    Lack of Transportation (Non-Medical): No  Physical Activity: Sufficiently Active (07/12/2024)   Exercise Vital Sign    Days of Exercise per Week: 7 days    Minutes of Exercise per Session: 30 min  Stress: No Stress Concern Present (07/12/2024)   [name] of Occupational Health - Occupational Stress Questionnaire    Feeling of Stress: Only a little  Social Connections: Socially Integrated (07/12/2024)   Social Connection and Isolation Panel    Frequency of Communication with Friends and Family: Three times a week    Frequency of Social Gatherings with Friends and Family: Once a week    Attends Religious Services: More than 4 times per year    Active Member of [name] or Organizations: Yes    Attends Banker Meetings: 1 to 4 times per year    Marital Status: Married  Catering manager Violence: Not on file    Outpatient Medications Prior to Visit  Medication Sig Dispense Refill   atorvastatin  (LIPITOR) 40 MG tablet Take 1 tablet (40 mg total) by mouth daily. 90 tablet 1   Cholecalciferol (VITAMIN D3) 75 MCG (3000 UT) TABS Take 1 tablet by mouth daily at 6 (six) AM.     COVID-19 mRNA vaccine, Pfizer, (COMIRNATY) syringe Inject into the muscle. 0.3 mL 0   cyanocobalamin  (VITAMIN B12) 1000 MCG tablet Take 1 tablet (1,000 mcg total) by mouth daily.     escitalopram  (LEXAPRO ) 20 MG tablet Take 1 tablet (20 mg total) by mouth daily. 90 tablet 1   influenza vac split trivalent PF (FLUZONE ) 0.5 ML injection Inject into the muscle. 0.5 mL 0   losartan  (COZAAR ) 25 MG tablet Take 1 tablet (25 mg total) by mouth daily. 90 tablet 1   RSV vaccine recomb adjuvanted (AREXVY ) 120 MCG/0.5ML injection Inject into the muscle. 0.5 mL 0   tretinoin  (RETIN-A ) 0.025 % cream Apply topically at bedtime. Apply on M-W-F 45 g 4   Semaglutide , 1 MG/DOSE, 4 MG/3ML SOPN Inject  1 mg as directed once a week. 9 mL 0   No facility-administered medications prior to visit.    Allergies  Allergen Reactions   Red Blood Cells     Jehovah's Witness, does not want to receive blood    ROS    See HPI Objective:    Physical Exam Constitutional:      General:  She is not in acute distress.    Appearance: Normal appearance. She is well-developed.  HENT:     Head: Normocephalic and atraumatic.     Right Ear: External ear normal.     Left Ear: External ear normal.  Eyes:     General: No scleral icterus. Neck:     Thyroid : No thyromegaly.  Cardiovascular:     Rate and Rhythm: Normal rate and regular rhythm.     Heart sounds: Normal heart sounds. No murmur heard. Pulmonary:     Effort: Pulmonary effort is normal. No respiratory distress.     Breath sounds: Normal breath sounds. No wheezing.  Musculoskeletal:     Cervical back: Neck supple.  Skin:    General: Skin is warm and dry.  Neurological:     Mental Status: She is alert and oriented to person, place, and time.  Psychiatric:        Mood and Affect: Mood normal.        Behavior: Behavior normal.        Thought Content: Thought content normal.        Judgment: Judgment normal.      BP 134/85 (BP Location: Right Arm, Patient Position: Sitting, Cuff Size: Normal)   Pulse 94   Temp 98.4 F (36.9 C) (Oral)   Resp 16   Ht 5' 5 (1.651 m)   Wt 218 lb (98.9 kg)   LMP 12/05/2021   SpO2 96%   BMI 36.28 kg/m  Wt Readings from Last 3 Encounters:  08/30/24 218 lb (98.9 kg)  07/12/24 210 lb (95.3 kg)  06/19/24 205 lb (93 kg)       Assessment & Plan:   Problem List Items Addressed This Visit       High   Diabetes mellitus (HCC) - Primary   Relevant Medications   tirzepatide  (MOUNJARO ) 2.5 MG/0.5ML Pen     Unprioritized   Class 1 obesity without serious comorbidity with body mass index (BMI) of 34.0 to 34.9 in adult   Lab Results  Component Value Date   HGBA1C 6.2 07/12/2024   A1C is  stable, but not seeing weight loss on ozempic  like she did on mounjaro .  Mounjaro  previously effective but discontinued due to GI side effects when she started the 7.5mg  dose. She did well on the 2.5mg  and 5mg  dosing. Low-carb diet and regular exercise maintained. - Initiate Mounjaro  2.5 mg for one month. Stop Ozempic . - Pt advised to send note in three weeks regarding med tolerance and weight status. - If tolerated, increase Mounjaro  to 5 mg next month.      Relevant Medications   tirzepatide  (MOUNJARO ) 2.5 MG/0.5ML Pen    I have discontinued Cambre D. Kukuk's Semaglutide  (1 MG/DOSE). I am also having her start on tirzepatide . Additionally, I am having her maintain her cyanocobalamin , tretinoin , Vitamin D3, escitalopram , losartan , atorvastatin , COVID-19 mRNA vaccine (Pfizer), Fluzone , and Arexvy .  Meds ordered this encounter  Medications   tirzepatide  (MOUNJARO ) 2.5 MG/0.5ML Pen    Sig: Inject 2.5 mg into the skin once a week.    Dispense:  2 mL    Refill:  0    Supervising Provider:   DOMENICA BLACKBIRD A [[id number]]

## 2024-09-12 ENCOUNTER — Ambulatory Visit: Admitting: Family

## 2024-09-18 ENCOUNTER — Ambulatory Visit (INDEPENDENT_AMBULATORY_CARE_PROVIDER_SITE_OTHER): Admitting: Family Medicine

## 2024-09-18 ENCOUNTER — Encounter: Payer: Self-pay | Admitting: Family

## 2024-09-18 ENCOUNTER — Other Ambulatory Visit (HOSPITAL_COMMUNITY): Payer: Self-pay

## 2024-09-18 ENCOUNTER — Encounter (INDEPENDENT_AMBULATORY_CARE_PROVIDER_SITE_OTHER): Payer: Self-pay | Admitting: Family Medicine

## 2024-09-18 VITALS — BP 116/89 | HR 92 | Temp 97.8°F | Ht 65.0 in | Wt 210.0 lb

## 2024-09-18 DIAGNOSIS — I10 Essential (primary) hypertension: Secondary | ICD-10-CM

## 2024-09-18 DIAGNOSIS — Z6834 Body mass index (BMI) 34.0-34.9, adult: Secondary | ICD-10-CM | POA: Diagnosis not present

## 2024-09-18 DIAGNOSIS — E66812 Obesity, class 2: Secondary | ICD-10-CM

## 2024-09-18 DIAGNOSIS — Z7985 Long-term (current) use of injectable non-insulin antidiabetic drugs: Secondary | ICD-10-CM | POA: Diagnosis not present

## 2024-09-18 DIAGNOSIS — E66811 Obesity, class 1: Secondary | ICD-10-CM

## 2024-09-18 DIAGNOSIS — E11649 Type 2 diabetes mellitus with hypoglycemia without coma: Secondary | ICD-10-CM

## 2024-09-18 DIAGNOSIS — Z6833 Body mass index (BMI) 33.0-33.9, adult: Secondary | ICD-10-CM

## 2024-09-18 DIAGNOSIS — E1169 Type 2 diabetes mellitus with other specified complication: Secondary | ICD-10-CM

## 2024-09-18 MED ORDER — TIRZEPATIDE 5 MG/0.5ML ~~LOC~~ SOAJ
5.0000 mg | SUBCUTANEOUS | 1 refills | Status: DC
  Filled 2024-09-18 – 2024-09-22 (×2): qty 2, 28d supply, fill #0
  Filled 2024-10-13: qty 2, 28d supply, fill #1

## 2024-09-18 NOTE — Progress Notes (Signed)
 Office: [phone]  /  Fax: [phone]  WEIGHT SUMMARY AND BIOMETRICS  Anthropometric Measurements Height: 5' 5 (1.651 m) Weight: 210 lb (95.3 kg) BMI (Calculated): 34.95 Weight at Last Visit: 205lb Weight Lost Since Last Visit: 0lb Weight Gained Since Last Visit: 5lb Starting Weight: 204lb Total Weight Loss (lbs): 10 lb (4.536 kg) Peak Weight: 220lb   Body Composition  Body Fat %: 42.3 % Fat Mass (lbs): 89 lbs Muscle Mass (lbs): 115.2 lbs Total Body Water (lbs): 78.4 lbs Visceral Fat Rating : 11   Other Clinical Data Fasting: yes Labs: no Today's Visit #: 82 Starting Date: 05/17/17    Chief Complaint: OBESITY   History of Present Illness Yvonne [name] is a 53 year old female with obesity, hypertension, and type 2 diabetes who presents for obesity treatment and progress assessment.  She has been adhering to a low-carb eating plan approximately 80% of the time, meeting protein recommendations but not consistently consuming more fruits and vegetables. She hydrates adequately, does not skip meals, and aims for seven or more hours of sleep per night. She engages in daily 30-minute walks for exercise. Despite these efforts, she has gained five pounds since her last visit three months ago.  Her hypertension is managed with losartan , and her blood pressure was 116/89 at today's visit.  She has type 2 diabetes and was previously on Ozempic , which she felt was no longer effective at a dose of 1 mg. She has since switched back to Mounjaro  at a dose of 2.5 mg, which she has been on for about three weeks. She previously experienced gastrointestinal issues with a higher dose of Mounjaro  but is tolerating the current dose well. Her weight is currently 210 pounds, down from 218 pounds three weeks ago. Her most recent A1c was 6.2. She has reduced her intake of high-sugar beverages like raspberry iced tea.  She is mindful of her carbohydrate intake, particularly avoiding bread  and limiting rice and pasta. She uses 'I can't believe it's not butter' and sometimes uses a garlic spray. She enjoys melons like watermelon and cantaloupe but is cautious with high-sugar fruits. She has experienced occasional low blood sugar episodes, with the lowest being 55, but these are not frequent.  Her husband had a lung transplant, and they are both on a strict eating plan. She plans to stay homebound for the winter due to his health condition and recent vaccinations for COVID and flu.      PHYSICAL EXAM:  Blood pressure 116/89, pulse 92, temperature 97.8 F (36.6 C), height 5' 5 (1.651 m), weight 210 lb (95.3 kg), last menstrual period 12/05/2021, SpO2 98%. Body mass index is 34.95 kg/m.  DIAGNOSTIC DATA REVIEWED:  BMET    Component Value Date/Time   NA 142 07/12/2024 0847   NA 141 06/29/2023 0829   K 4.0 07/12/2024 0847   CL 108 07/12/2024 0847   CO2 27 07/12/2024 0847   GLUCOSE 96 07/12/2024 0847   BUN 12 07/12/2024 0847   BUN 24 06/29/2023 0829   CREATININE 0.86 07/12/2024 0847   CREATININE 0.85 04/28/2019 1457   CALCIUM  9.8 07/12/2024 0847   GFRNONAA 58 (L) 12/06/2021 0807   GFRAA 95 01/08/2021 0740   Lab Results  Component Value Date   HGBA1C 6.2 07/12/2024   HGBA1C 5.7 09/14/2011   Lab Results  Component Value Date   INSULIN  51.4 (H) 06/29/2023   INSULIN  27.9 (H) 05/17/2017   Lab Results  Component Value Date   TSH 1.140  06/29/2023   CBC    Component Value Date/Time   WBC 8.1 06/29/2023 0829   WBC 8.8 01/12/2022 0940   RBC 4.41 06/29/2023 0829   RBC 3.87 01/12/2022 0940   HGB 13.7 06/29/2023 0829   HCT 40.5 06/29/2023 0829   PLT 311 06/29/2023 0829   MCV 92 06/29/2023 0829   MCH 31.1 06/29/2023 0829   MCH 31.3 12/07/2021 0711   MCHC 33.8 06/29/2023 0829   MCHC 33.1 01/12/2022 0940   RDW 13.9 06/29/2023 0829   Iron Studies    Component Value Date/Time   IRON 77 01/12/2022 0940   FERRITIN 131.2 01/12/2022 0940   Lipid Panel      Component Value Date/Time   CHOL 159 04/12/2024 1157   CHOL 204 (H) 06/29/2023 0829   TRIG 56.0 04/12/2024 1157   HDL 73.20 04/12/2024 1157   HDL 82 06/29/2023 0829   CHOLHDL 2 04/12/2024 1157   VLDL 11.2 04/12/2024 1157   LDLCALC 74 04/12/2024 1157   LDLCALC 108 (H) 06/29/2023 0829   LDLCALC 127 (H) 04/28/2019 1457   Hepatic Function Panel     Component Value Date/Time   PROT 6.9 04/12/2024 1157   PROT 6.7 06/29/2023 0829   ALBUMIN  4.1 06/29/2023 0829   AST 16 06/29/2023 0829   ALT 16 06/29/2023 0829   ALKPHOS 142 (H) 06/29/2023 0829   BILITOT <0.2 06/29/2023 0829   BILIDIR 0.1 01/18/2023 0739   IBILI 0.3 04/28/2019 1457      Component Value Date/Time   TSH 1.140 06/29/2023 0829   Nutritional Lab Results  Component Value Date   VD25OH 49.12 04/12/2024   VD25OH 19.31 (L) 10/29/2023   VD25OH 24.4 (L) 06/29/2023     Assessment and Plan Assessment & Plan Obesity, class 2 Obesity, class 2, with a recent weight gain of five pounds over the last three months. Currently following a low-carb eating plan about 80% of the time, meeting protein recommendations, but not consistently consuming fruits and vegetables. Engaging in daily exercise with 30 minutes of walking. Discussed challenges with medication changes from Ozempic  to Mounjaro  due to lack of efficacy and GI issues, respectively. Currently on Mounjaro  2.5 mg with better tolerance and some weight loss noted. Discussed the cost and insurance coverage issues with [employer]  and the importance of continuing with current management strategies. Emphasized the need for portion control and smart food choices. - Continue low-carb eating plan with emphasis on increasing fruits and vegetables - Continue daily exercise with 30 minutes of walking - Continue Mounjaro  2.5 mg and monitor for efficacy and tolerance - Discuss with PCP about potential dose adjustment of Mounjaro  after four doses - Continue portion control and smart food  choices - Monitor weight and report to PCP  Essential hypertension Essential hypertension, currently well-controlled with a blood pressure reading of 116/89 mmHg. On losartan  for blood pressure management. - Continue losartan  for blood pressure management - Continue exercise and weight loss efforts  Type 2 diabetes mellitus with hypoglycemia Type 2 diabetes mellitus with hypoglycemia, with a recent A1c of 6.2%. Previously on Ozempic , now switched to Mounjaro  due to lack of efficacy of Ozempic . Experiencing occasional hypoglycemia, likely related to dietary intake rather than Mounjaro . Discussed the importance of maintaining a balanced diet and monitoring blood glucose levels. Emphasized the need to avoid high sugar fruits and increase intake of whole fruits. - Continue Mounjaro  as prescribed by PCP - Monitor blood glucose levels, especially during periods of low intake - Increase intake of whole  fruits while avoiding high sugar fruits - Follow up with PCP for medication management and A1c monitoring  Follow-Up Follow-up planned after the holidays to assess progress and make any necessary adjustments to the treatment plan. - Schedule follow-up appointment for January after the holidays - Ensure appointment with PCP in December for further evaluation and management     I personally spent a total of 35 minutes in the care of the patient today including preparing to see the patient, reviewing separately obtained history, performing a medically appropriate evaluation of current problems, documenting clinical information in the EMR, customized nutritional counseling for their specific health and social needs, and discussing results with the patient and educating them on how these results can affect their health and weight.    Yvonne [name] was informed of the importance of frequent follow up visits to maximize her success with intensive lifestyle modifications for her obesity and obesity related health  conditions as recommended by USPSTF and CMS guidelines   Louann Penton, MD

## 2024-09-22 ENCOUNTER — Other Ambulatory Visit: Payer: Self-pay

## 2024-09-25 ENCOUNTER — Other Ambulatory Visit (HOSPITAL_COMMUNITY): Payer: Self-pay

## 2024-09-29 ENCOUNTER — Other Ambulatory Visit (HOSPITAL_COMMUNITY): Payer: Self-pay

## 2024-09-30 ENCOUNTER — Other Ambulatory Visit (HOSPITAL_COMMUNITY): Payer: Self-pay

## 2024-10-03 ENCOUNTER — Other Ambulatory Visit: Payer: Self-pay

## 2024-10-03 ENCOUNTER — Other Ambulatory Visit (HOSPITAL_COMMUNITY): Payer: Self-pay

## 2024-10-17 ENCOUNTER — Other Ambulatory Visit (HOSPITAL_COMMUNITY): Payer: Self-pay

## 2024-10-21 ENCOUNTER — Other Ambulatory Visit (HOSPITAL_COMMUNITY): Payer: Self-pay

## 2024-10-31 ENCOUNTER — Other Ambulatory Visit (HOSPITAL_COMMUNITY): Payer: Self-pay

## 2024-11-03 ENCOUNTER — Other Ambulatory Visit (HOSPITAL_COMMUNITY): Payer: Self-pay

## 2024-11-10 ENCOUNTER — Ambulatory Visit: Admitting: Family

## 2024-11-20 ENCOUNTER — Other Ambulatory Visit: Payer: Self-pay

## 2024-11-20 ENCOUNTER — Other Ambulatory Visit (HOSPITAL_COMMUNITY): Payer: Self-pay

## 2024-11-20 ENCOUNTER — Other Ambulatory Visit (HOSPITAL_BASED_OUTPATIENT_CLINIC_OR_DEPARTMENT_OTHER): Payer: Self-pay

## 2024-11-20 ENCOUNTER — Other Ambulatory Visit: Payer: Self-pay | Admitting: Family

## 2024-11-20 DIAGNOSIS — E66811 Obesity, class 1: Secondary | ICD-10-CM

## 2024-11-20 MED ORDER — MOUNJARO 5 MG/0.5ML ~~LOC~~ SOAJ
5.0000 mg | SUBCUTANEOUS | 0 refills | Status: AC
  Filled 2024-11-20: qty 6, 84d supply, fill #0

## 2024-11-29 ENCOUNTER — Other Ambulatory Visit (HOSPITAL_COMMUNITY): Payer: Self-pay

## 2024-11-29 ENCOUNTER — Encounter: Payer: Self-pay | Admitting: Family

## 2024-11-29 ENCOUNTER — Ambulatory Visit: Admitting: Family

## 2024-11-29 ENCOUNTER — Telehealth: Payer: Self-pay | Admitting: Family

## 2024-11-29 VITALS — BP 112/68 | HR 80 | Temp 98.3°F | Resp 16 | Ht 65.0 in | Wt 216.6 lb

## 2024-11-29 DIAGNOSIS — Z7985 Long-term (current) use of injectable non-insulin antidiabetic drugs: Secondary | ICD-10-CM

## 2024-11-29 DIAGNOSIS — F3289 Other specified depressive episodes: Secondary | ICD-10-CM

## 2024-11-29 DIAGNOSIS — I10 Essential (primary) hypertension: Secondary | ICD-10-CM

## 2024-11-29 DIAGNOSIS — E559 Vitamin D deficiency, unspecified: Secondary | ICD-10-CM

## 2024-11-29 DIAGNOSIS — E782 Mixed hyperlipidemia: Secondary | ICD-10-CM | POA: Diagnosis not present

## 2024-11-29 DIAGNOSIS — E119 Type 2 diabetes mellitus without complications: Secondary | ICD-10-CM | POA: Diagnosis not present

## 2024-11-29 MED ORDER — ESCITALOPRAM OXALATE 20 MG PO TABS
20.0000 mg | ORAL_TABLET | Freq: Every day | ORAL | 1 refills | Status: AC
  Filled 2024-11-29: qty 90, 90d supply, fill #0

## 2024-11-29 NOTE — Assessment & Plan Note (Signed)
 Reports good mood on lexapro  20mg .

## 2024-11-29 NOTE — Addendum Note (Signed)
 Addended by: TRUDY CURVIN RAMAN on: 11/29/2024 09:56 AM   Modules accepted: Orders

## 2024-11-29 NOTE — Telephone Encounter (Signed)
 Request faxed

## 2024-11-29 NOTE — Assessment & Plan Note (Signed)
 Lab Results  Component Value Date   CHOL 159 04/12/2024   HDL 73.20 04/12/2024   LDLCALC 74 04/12/2024   TRIG 56.0 04/12/2024   CHOLHDL 2 04/12/2024   Maintained on atorvastatin  40 mg.

## 2024-11-29 NOTE — Patient Instructions (Signed)
" °  VISIT SUMMARY: Yvonne [name], a 53 year old female with type 2 diabetes and hypertension, came in for a routine follow-up on her medications and labs. Her diabetes is well-managed with Mounjaro  5 mg, and her blood pressure is controlled with losartan . She also manages her mood with Lexapro  and takes vitamin D  supplements. She experiences constipation, which she treats with Miralax . She is up to date with her health screenings and plans to schedule her next Pap smear, mammogram, and eye exam soon.  YOUR PLAN: -TYPE 2 DIABETES MELLITUS: Type 2 diabetes is a condition where your body does not use insulin  properly, leading to high blood sugar levels. Your A1c is at goal, and you are tolerating Mounjaro  5 mg well without gastrointestinal side effects. We have ordered an A1c test and will continue with your current dose. We will also discuss the possibility of using Rybelsus  for weight management with Dr. Verdon.  -WEIGHT MANANGEMENT:  You continue to follow with Dr. Verdon and continue Mounjaro . Continue using Miralax  as needed for constipation.  -DEPRESSIVE DISORDER: Depressive disorder is a condition characterized by persistent feelings of sadness and loss of interest. Your mood is stable on Lexapro  20 mg daily, so we will continue this medication.  -ESSENTIAL HYPERTENSION: Essential hypertension is high blood pressure with no identifiable cause. Your blood pressure is well-controlled with losartan , so we will continue this medication.  -VITAMIN D  DEFICIENCY: Vitamin D  deficiency means you have lower than normal levels of vitamin D , which is important for bone health. Your levels are adequate with your current dose of 3000 IU daily, so we will continue this supplement.     "

## 2024-11-29 NOTE — Assessment & Plan Note (Signed)
 Stable on vit D 3000 international units.

## 2024-11-29 NOTE — Telephone Encounter (Signed)
 Please request last pap from [facility].

## 2024-11-29 NOTE — Assessment & Plan Note (Signed)
 Lab Results  Component Value Date   HGBA1C 6.2 07/12/2024   HGBA1C 5.9 04/12/2024   HGBA1C 6.0 10/29/2023   Lab Results  Component Value Date   MICROALBUR 1.2 04/12/2024   LDLCALC 74 04/12/2024   CREATININE 0.86 07/12/2024   Continue mounjaro  and diet.

## 2024-11-29 NOTE — Assessment & Plan Note (Signed)
 BP Readings from Last 3 Encounters:  11/29/24 112/68  09/18/24 116/89  08/30/24 134/85   BP stable on losartan  25 mg.

## 2024-11-29 NOTE — Progress Notes (Signed)
 "  Subjective:     Patient ID: Yvonne [name], female    DOB: [birth year], 53 y.o.   MRN: [id number]  Chief Complaint  Patient presents with   Hypertension   Hyperlipidemia   Follow-up    HPI  Discussed the use of AI scribe software for clinical note transcription with the patient, who gave verbal consent to proceed.  History of Present Illness Yvonne [name] is a 53 year old female with type 2 diabetes and hypertension who presents for routine follow-up on medications and labs.  Her last A1c in August was 6.2, which was at goal. She believes she has been doing better since then. She continues to monitor her condition and has an upcoming appointment next month.  She is currently taking Mounjaro  5 mg for diabetes management. She previously experienced gastrointestinal side effects, including vomiting and diarrhea, when her dose was increased to 7.5 mg, leading her to revert to the 5 mg dose. No current gastrointestinal upsets at this dose. She had tried Rybelsus  and Saxenda  in the past, but had adverse reactions, including a severe gastrointestinal upset with Saxenda  that required hospitalization.  She experiences constipation, which she manages with Miralax , taking it once or twice a week as needed. Despite a diet rich in vegetables, she still requires Miralax  to alleviate her symptoms.  She noted that her lack of road rage this morning was reflected in her good blood pressure reading.  She is on Lexapro  20 mg for mood management and feels fine on this medication.  She takes 3000 IU of vitamin D  daily, with her last levels checked in May, which were satisfactory.  Her last Pap smear was in February or March of last year, and she plans to schedule her next one around the same time next year. Her mammogram is also due in February, and she is up to date with her eye exams, with the next one due in April.       Health Maintenance Due  Topic Date Due   Cervical Cancer Screening  (HPV/Pap Cotest)  01/01/2025    Past Medical History:  Diagnosis Date   Anemia    Anxiety    Arthritis    Borderline type 2 diabetes mellitus    High blood pressure    Joint pain    Kidney stones    Microscopic hematuria    Ovarian torsion 2022   Vitamin D  deficiency     Past Surgical History:  Procedure Laterality Date   COLONOSCOPY  01/26/2023   LAPAROTOMY N/A 12/02/2021   Procedure: EXPLORATORY LAPAROTOMY,REMOVAL OF RIGHT TUBE AND OVARY;  Surgeon: Viktoria Comer SAUNDERS, MD;  Location: [facility];  Service: Gynecology;  Laterality: N/A;    Family History  Problem Relation Age of Onset   Other Mother        deceased from multiple myelomas   Cancer Mother    Healthy Father    Congestive Heart Failure Father    Heart disease Father        CABG   Diabetes Father    Coronary artery disease Father    Cancer Maternal Grandmother    Multiple myeloma Maternal Grandmother    Cancer Maternal Grandfather        unsure what type of cancer   Diabetes Paternal Grandmother    Prostate cancer Paternal Grandmother    Colon cancer Neg Hx    Esophageal cancer Neg Hx    Liver cancer Neg Hx    Pancreatic cancer Neg  Hx    Rectal cancer Neg Hx    Stomach cancer Neg Hx     Social History   Socioeconomic History   Marital status: Married    Spouse name: Not on file   Number of children: 3   Years of education: Not on file   Highest education level: Associate degree: academic program  Occupational History   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    Employer: [employer]  Tobacco Use   Smoking status: Never   Smokeless tobacco: Never  Vaping Use   Vaping status: Never Used  Substance and Sexual Activity   Alcohol use: No   Drug use: No   Sexual activity: Yes    Partners: Male    Birth control/protection: Post-menopausal  Other Topics Concern   Not on file  Social History Narrative   5 children (3 are out of the house) 3 biological and 2 step   Married to [name]   Works in  patient Access at [name]   12/16/21 lives with husband one daughter at home who is 15 yr            Social Drivers of Health   Tobacco Use: Low Risk (11/29/2024)   Patient History    Smoking Tobacco Use: Never    Smokeless Tobacco Use: Never    Passive Exposure: Not on file  Financial Resource Strain: Low Risk (11/28/2024)   Overall Financial Resource Strain (CARDIA)    Difficulty of Paying Living Expenses: Not hard at all  Food Insecurity: No Food Insecurity (11/28/2024)   Epic    Worried About Radiation Protection Practitioner of Food in the Last Year: Never true    Ran Out of Food in the Last Year: Never true  Transportation Needs: No Transportation Needs (11/28/2024)   Epic    Lack of Transportation (Medical): No    Lack of Transportation (Non-Medical): No  Physical Activity: Sufficiently Active (11/28/2024)   Exercise Vital Sign    Days of Exercise per Week: 7 days    Minutes of Exercise per Session: 30 min  Stress: No Stress Concern Present (11/28/2024)   [name] of Occupational Health - Occupational Stress Questionnaire    Feeling of Stress: Only a little  Social Connections: Socially Integrated (11/28/2024)   Social Connection and Isolation Panel    Frequency of Communication with Friends and Family: More than three times a week    Frequency of Social Gatherings with Friends and Family: Three times a week    Attends Religious Services: More than 4 times per year    Active Member of Clubs or Organizations: Yes    Attends Banker Meetings: More than 4 times per year    Marital Status: Married  Catering Manager Violence: Not on file  Depression (PHQ2-9): Low Risk (11/29/2024)   Depression (PHQ2-9)    PHQ-2 Score: 2  Alcohol Screen: Low Risk (01/27/2022)   Alcohol Screen    Last Alcohol Screening Score (AUDIT): 0  Housing: Unknown (11/28/2024)   Epic    Unable to Pay for Housing in the Last Year: No    Number of Times Moved in the Last Year: Not on file     Homeless in the Last Year: No  Utilities: Not on file  Health Literacy: Not on file    Outpatient Medications Prior to Visit  Medication Sig Dispense Refill   atorvastatin  (LIPITOR) 40 MG tablet Take 1 tablet (40 mg total) by mouth daily. 90 tablet 1  Cholecalciferol (VITAMIN D3) 75 MCG (3000 UT) TABS Take 1 tablet by mouth daily at 6 (six) AM.     cyanocobalamin  (VITAMIN B12) 1000 MCG tablet Take 1 tablet (1,000 mcg total) by mouth daily.     losartan  (COZAAR ) 25 MG tablet Take 1 tablet (25 mg total) by mouth daily. 90 tablet 1   tirzepatide  (MOUNJARO ) 5 MG/0.5ML Pen Inject 5 mg into the skin once a week. 6 mL 0   escitalopram  (LEXAPRO ) 20 MG tablet Take 1 tablet (20 mg total) by mouth daily. 90 tablet 1   COVID-19 mRNA vaccine, Pfizer, (COMIRNATY ) syringe Inject into the muscle. 0.3 mL 0   influenza vac split trivalent PF (FLUZONE ) 0.5 ML injection Inject into the muscle. 0.5 mL 0   RSV vaccine recomb adjuvanted (AREXVY ) 120 MCG/0.5ML injection Inject into the muscle. 0.5 mL 0   No facility-administered medications prior to visit.    Allergies[1]  ROS See HPI    Objective:    Physical Exam Constitutional:      General: She is not in acute distress.    Appearance: Normal appearance. She is well-developed.  HENT:     Head: Normocephalic and atraumatic.     Right Ear: External ear normal.     Left Ear: External ear normal.  Eyes:     General: No scleral icterus. Neck:     Thyroid : No thyromegaly.  Cardiovascular:     Rate and Rhythm: Normal rate and regular rhythm.     Heart sounds: Normal heart sounds. No murmur heard. Pulmonary:     Effort: Pulmonary effort is normal. No respiratory distress.     Breath sounds: Normal breath sounds. No wheezing.  Musculoskeletal:     Cervical back: Neck supple.  Skin:    General: Skin is warm and dry.  Neurological:     Mental Status: She is alert and oriented to person, place, and time.  Psychiatric:        Mood and Affect: Mood  normal.        Behavior: Behavior normal.        Thought Content: Thought content normal.        Judgment: Judgment normal.      BP 112/68 (BP Location: Right Arm, Patient Position: Sitting, Cuff Size: Large)   Pulse 80   Temp 98.3 F (36.8 C) (Oral)   Resp 16   Ht 5' 5 (1.651 m)   Wt 216 lb 9.6 oz (98.2 kg)   LMP 12/05/2021   SpO2 96%   BMI 36.04 kg/m  Wt Readings from Last 3 Encounters:  11/29/24 216 lb 9.6 oz (98.2 kg)  09/18/24 210 lb (95.3 kg)  08/30/24 218 lb (98.9 kg)       Assessment & Plan:   Problem List Items Addressed This Visit       High   Type 2 diabetes mellitus without complications (HCC) - Primary   Lab Results  Component Value Date   HGBA1C 6.2 07/12/2024   HGBA1C 5.9 04/12/2024   HGBA1C 6.0 10/29/2023   Lab Results  Component Value Date   MICROALBUR 1.2 04/12/2024   LDLCALC 74 04/12/2024   CREATININE 0.86 07/12/2024   Continue mounjaro  and diet.        Relevant Orders   Comp Met (CMET)   HgB A1c     Unprioritized   Vitamin D  deficiency   Stable on vit D 3000 international units.       Hypertension   BP Readings from  Last 3 Encounters:  11/29/24 112/68  09/18/24 116/89  08/30/24 134/85   BP stable on losartan  25 mg.        Hyperlipidemia   Lab Results  Component Value Date   CHOL 159 04/12/2024   HDL 73.20 04/12/2024   LDLCALC 74 04/12/2024   TRIG 56.0 04/12/2024   CHOLHDL 2 04/12/2024   Maintained on atorvastatin  40 mg.       Depression   Reports good mood on lexapro  20mg .        Relevant Medications   escitalopram  (LEXAPRO ) 20 MG tablet    I have discontinued Georgenia D. Oshita's COVID-19 mRNA vaccine Autonation), Fluzone , and Arexvy . I am also having her maintain her cyanocobalamin , Vitamin D3, losartan , atorvastatin , Mounjaro , and escitalopram .  Meds ordered this encounter  Medications   escitalopram  (LEXAPRO ) 20 MG tablet    Sig: Take 1 tablet (20 mg total) by mouth daily.    Dispense:  90 tablet     Refill:  1    Supervising Provider:   DOMENICA BLACKBIRD A [[id number]]       [1]  Allergies Allergen Reactions   Red Blood Cells     Jehovah's Witness, does not want to receive blood   "

## 2024-11-30 LAB — COMPREHENSIVE METABOLIC PANEL WITH GFR
AG Ratio: 1.7 (calc) (ref 1.0–2.5)
ALT: 18 U/L (ref 6–29)
AST: 13 U/L (ref 10–35)
Albumin: 4.2 g/dL (ref 3.6–5.1)
Alkaline phosphatase (APISO): 150 U/L (ref 37–153)
BUN/Creatinine Ratio: 22 (calc) (ref 6–22)
BUN: 24 mg/dL (ref 7–25)
CO2: 22 mmol/L (ref 20–32)
Calcium: 9.9 mg/dL (ref 8.6–10.4)
Chloride: 110 mmol/L (ref 98–110)
Creat: 1.07 mg/dL — ABNORMAL HIGH (ref 0.50–1.03)
Globulin: 2.5 g/dL (ref 1.9–3.7)
Glucose, Bld: 95 mg/dL (ref 65–99)
Potassium: 4.4 mmol/L (ref 3.5–5.3)
Sodium: 140 mmol/L (ref 135–146)
Total Bilirubin: 0.5 mg/dL (ref 0.2–1.2)
Total Protein: 6.7 g/dL (ref 6.1–8.1)
eGFR: 62 mL/min/1.73m2

## 2024-11-30 LAB — HEMOGLOBIN A1C
Hgb A1c MFr Bld: 5.9 % — ABNORMAL HIGH
Mean Plasma Glucose: 123 mg/dL
eAG (mmol/L): 6.8 mmol/L

## 2024-12-01 ENCOUNTER — Ambulatory Visit: Payer: Self-pay | Admitting: Family

## 2024-12-04 ENCOUNTER — Encounter (INDEPENDENT_AMBULATORY_CARE_PROVIDER_SITE_OTHER): Payer: Self-pay | Admitting: Family Medicine

## 2024-12-04 ENCOUNTER — Ambulatory Visit (INDEPENDENT_AMBULATORY_CARE_PROVIDER_SITE_OTHER): Admitting: Family Medicine

## 2024-12-04 ENCOUNTER — Other Ambulatory Visit (HOSPITAL_COMMUNITY): Payer: Self-pay

## 2024-12-04 VITALS — BP 139/88 | HR 93 | Temp 98.7°F | Ht 65.0 in | Wt 211.0 lb

## 2024-12-04 DIAGNOSIS — Z7985 Long-term (current) use of injectable non-insulin antidiabetic drugs: Secondary | ICD-10-CM

## 2024-12-04 DIAGNOSIS — E669 Obesity, unspecified: Secondary | ICD-10-CM

## 2024-12-04 DIAGNOSIS — E119 Type 2 diabetes mellitus without complications: Secondary | ICD-10-CM

## 2024-12-04 DIAGNOSIS — I10 Essential (primary) hypertension: Secondary | ICD-10-CM | POA: Diagnosis not present

## 2024-12-04 DIAGNOSIS — Z6835 Body mass index (BMI) 35.0-35.9, adult: Secondary | ICD-10-CM | POA: Diagnosis not present

## 2024-12-04 NOTE — Progress Notes (Signed)
 "  Office: [phone]  /  Fax: [phone]  WEIGHT SUMMARY AND BIOMETRICS  Anthropometric Measurements Height: 5' 5 (1.651 m) Weight: 211 lb (95.7 kg) BMI (Calculated): 35.11 Weight at Last Visit: 210lb Weight Lost Since Last Visit: 0lb Weight Gained Since Last Visit: 1lb Starting Weight: 204lb Total Weight Loss (lbs): 0 lb (0 kg) Peak Weight: 220lb   Body Composition  Body Fat %: 43.5 % Fat Mass (lbs): 92 lbs Muscle Mass (lbs): 113.6 lbs Total Body Water (lbs): 82 lbs Visceral Fat Rating : 12   Other Clinical Data Fasting: No Labs: no Today's Visit #: 46 Starting Date: 05/17/17    Chief Complaint: OBESITY    History of Present Illness Yvonne [name] is a 53 year old female with obesity, type 2 diabetes, and hypertension who presents for obesity treatment and progress assessment.  She adheres to a low-carb eating plan approximately 75% of the time and engages in walking for exercise for 30 minutes, seven days a week. Despite these efforts, she has gained one pound over the last ten weeks, which she considers reasonable given the holiday season.  Her type 2 diabetes is well controlled with Mounjaro  5 mg. She manages her hypertension with losartan  25 mg daily. She is actively working on diet, exercise, and weight loss to manage her obesity, diabetes, and hypertension.  She mentions challenges in maintaining her health regimen due to personal life events, such as her daughter's new driving permit and upcoming graduation, which add stress to her daily routine. Despite these challenges, she remains committed to her health goals.      PHYSICAL EXAM:  Blood pressure 139/88, pulse 93, temperature 98.7 F (37.1 C), height 5' 5 (1.651 m), weight 211 lb (95.7 kg), last menstrual period 12/05/2021, SpO2 99%. Body mass index is 35.11 kg/m.  DIAGNOSTIC DATA REVIEWED:  BMET    Component Value Date/Time   NA 140 11/29/2024 0956   NA 141 06/29/2023 0829   K 4.4  11/29/2024 0956   CL 110 11/29/2024 0956   CO2 22 11/29/2024 0956   GLUCOSE 95 11/29/2024 0956   BUN 24 11/29/2024 0956   BUN 24 06/29/2023 0829   CREATININE 1.07 (H) 11/29/2024 0956   CALCIUM  9.9 11/29/2024 0956   GFRNONAA 58 (L) 12/06/2021 0807   GFRAA 95 01/08/2021 0740   Lab Results  Component Value Date   HGBA1C 5.9 (H) 11/29/2024   HGBA1C 5.7 09/14/2011   Lab Results  Component Value Date   INSULIN  51.4 (H) 06/29/2023   INSULIN  27.9 (H) 05/17/2017   Lab Results  Component Value Date   TSH 1.140 06/29/2023   CBC    Component Value Date/Time   WBC 8.1 06/29/2023 0829   WBC 8.8 01/12/2022 0940   RBC 4.41 06/29/2023 0829   RBC 3.87 01/12/2022 0940   HGB 13.7 06/29/2023 0829   HCT 40.5 06/29/2023 0829   PLT 311 06/29/2023 0829   MCV 92 06/29/2023 0829   MCH 31.1 06/29/2023 0829   MCH 31.3 12/07/2021 0711   MCHC 33.8 06/29/2023 0829   MCHC 33.1 01/12/2022 0940   RDW 13.9 06/29/2023 0829   Iron Studies    Component Value Date/Time   IRON 77 01/12/2022 0940   FERRITIN 131.2 01/12/2022 0940   Lipid Panel     Component Value Date/Time   CHOL 159 04/12/2024 1157   CHOL 204 (H) 06/29/2023 0829   TRIG 56.0 04/12/2024 1157   HDL 73.20 04/12/2024 1157   HDL 82 06/29/2023  0829   CHOLHDL 2 04/12/2024 1157   VLDL 11.2 04/12/2024 1157   LDLCALC 74 04/12/2024 1157   LDLCALC 108 (H) 06/29/2023 0829   LDLCALC 127 (H) 04/28/2019 1457   Hepatic Function Panel     Component Value Date/Time   PROT 6.7 11/29/2024 0956   PROT 6.7 06/29/2023 0829   ALBUMIN  4.1 06/29/2023 0829   AST 13 11/29/2024 0956   ALT 18 11/29/2024 0956   ALKPHOS 142 (H) 06/29/2023 0829   BILITOT 0.5 11/29/2024 0956   BILITOT <0.2 06/29/2023 0829   BILIDIR 0.1 01/18/2023 0739   IBILI 0.3 04/28/2019 1457      Component Value Date/Time   TSH 1.140 06/29/2023 0829   Nutritional Lab Results  Component Value Date   VD25OH 49.12 04/12/2024   VD25OH 19.31 (L) 10/29/2023   VD25OH 24.4 (L)  06/29/2023     Assessment and Plan Assessment & Plan Obesity Management is ongoing with a low-carb diet and regular exercise. She has gained one pound over the last ten weeks, which is considered acceptable given the holiday period. She is interested in transitioning from Mounjaro  to oral Wegovy  (semaglutide ) as she feels she did better on Wegovy  for weight loss and her insurance does not cover this very expensive medication. Oral Wegovy  is expected to be available on January 2nd, 2026. She is aware of the potential for increased gastrointestinal side effects, such as nausea, with the oral formulation compared to the injectable version. She is willing to self-pay for the medication if necessary. - Prescribed oral Wegovy  semaglutide  1.5 mg daily for the first month, then increase to 4 mg based on tolerance. - Sent prescription to [facility]. - Continue current exercise regimen and low-carb diet.  Type 2 diabetes mellitus Well-controlled on Mounjaro  5 mg. She is considering transitioning to oral Wegovy  for obesity management, which may also aid in diabetes control. - Continue Mounjaro  until oral Wegovy  is obtained and assessed for effectiveness. - Continue diet, exercise and weight loss as discussed today as an important part of the treatment plan  Essential hypertension Managed with losartan  25 mg daily. Blood pressure was slightly elevated at 139/88 mmHg, likely due to stress from rushing to the appointment. She is also working on diet, exercise, and weight loss to manage hypertension. - Continue losartan  25 mg daily. - Encouraged continued diet, exercise, and weight loss efforts.      Patients who are on anti-obesity medications are counseled on the importance of maintaining healthy lifestyle habits, including balanced nutrition, regular physical activity, and behavioral modifications,  Medication is an adjunct to, not a replacement for, lifestyle changes and that the long-term success and  weight maintenance depend on continued adherence to these strategies.   Yvonne [name] was informed of the importance of frequent follow up visits to maximize her success with intensive lifestyle modifications for her obesity and obesity related health conditions as recommended by USPSTF and CMS guidelines   Louann Penton, MD   "

## 2024-12-05 ENCOUNTER — Encounter (INDEPENDENT_AMBULATORY_CARE_PROVIDER_SITE_OTHER): Payer: Self-pay | Admitting: Family Medicine

## 2024-12-11 ENCOUNTER — Other Ambulatory Visit (HOSPITAL_COMMUNITY): Payer: Self-pay

## 2024-12-18 ENCOUNTER — Other Ambulatory Visit (HOSPITAL_COMMUNITY): Payer: Self-pay

## 2024-12-18 ENCOUNTER — Encounter (INDEPENDENT_AMBULATORY_CARE_PROVIDER_SITE_OTHER): Payer: Self-pay | Admitting: Family Medicine

## 2024-12-18 ENCOUNTER — Ambulatory Visit (INDEPENDENT_AMBULATORY_CARE_PROVIDER_SITE_OTHER): Payer: Self-pay | Admitting: Family Medicine

## 2024-12-18 VITALS — BP 124/87 | HR 99 | Temp 97.6°F | Ht 65.0 in | Wt 215.0 lb

## 2024-12-18 DIAGNOSIS — Z6835 Body mass index (BMI) 35.0-35.9, adult: Secondary | ICD-10-CM | POA: Diagnosis not present

## 2024-12-18 DIAGNOSIS — E669 Obesity, unspecified: Secondary | ICD-10-CM | POA: Diagnosis not present

## 2024-12-18 DIAGNOSIS — Z7985 Long-term (current) use of injectable non-insulin antidiabetic drugs: Secondary | ICD-10-CM

## 2024-12-18 DIAGNOSIS — E119 Type 2 diabetes mellitus without complications: Secondary | ICD-10-CM | POA: Diagnosis not present

## 2024-12-18 NOTE — Progress Notes (Signed)
 "  Office: [phone]  /  Fax: [phone]  WEIGHT SUMMARY AND BIOMETRICS  Anthropometric Measurements Height: 5' 5 (1.651 m) Weight: 215 lb (97.5 kg) BMI (Calculated): 35.78 Weight at Last Visit: 211 lb Weight Lost Since Last Visit: 0 Weight Gained Since Last Visit: 4 lb Starting Weight: 204 lb Total Weight Loss (lbs): 0 lb (0 kg) Peak Weight: 220 lb   Body Composition  Body Fat %: 43.5 % Fat Mass (lbs): 93.8 lbs Muscle Mass (lbs): 115.6 lbs Total Body Water (lbs): 81 lbs Visceral Fat Rating : 12   Other Clinical Data Fasting: yes Labs: no Today's Visit #: 59 Starting Date: 05/17/17    Chief Complaint: OBESITY    History of Present Illness Yvonne [name] is a 54 year old female with obesity and type 2 diabetes who presents for obesity treatment plan assessment and progress evaluation.  She has been adhering to a low carb diet about fifty percent of the time and is working on hydrating adequately and consuming her recommended amount of protein. She is also attempting to increase her intake of fruits and vegetables, avoid skipping meals, and generally achieves seven to nine hours of sleep per night. Despite these efforts, she has gained four pounds since her last visit.  She engages in walking for exercise for thirty minutes, seven days a week. Her eating habits have been disrupted due to a recent cold, during which she primarily consumed orange juice, soup, crackers, and ginger ale. She has been taking cold and cough medicine, focusing on alleviating head congestion. She has been unable to obtain Wegovy  due to availability issues and is currently on Mounjaro  5 mg weekly.  In addition to obesity, she is managing type 2 diabetes. Her A1c has improved from 6.2 to 5.9. She is mindful of her sugar intake, which has positively impacted her dental health, as she has not had a cavity in years. She regularly monitors her blood sugar and is aware of her family history of  diabetes, which motivates her to manage her condition carefully.  She reports recent head congestion and stuffiness without a cough, following a cold.      PHYSICAL EXAM:  Blood pressure 124/87, pulse 99, temperature 97.6 F (36.4 C), height 5' 5 (1.651 m), weight 215 lb (97.5 kg), last menstrual period 12/05/2021, SpO2 98%. Body mass index is 35.78 kg/m.  DIAGNOSTIC DATA REVIEWED:  BMET    Component Value Date/Time   NA 140 11/29/2024 0956   NA 141 06/29/2023 0829   K 4.4 11/29/2024 0956   CL 110 11/29/2024 0956   CO2 22 11/29/2024 0956   GLUCOSE 95 11/29/2024 0956   BUN 24 11/29/2024 0956   BUN 24 06/29/2023 0829   CREATININE 1.07 (H) 11/29/2024 0956   CALCIUM  9.9 11/29/2024 0956   GFRNONAA 58 (L) 12/06/2021 0807   GFRAA 95 01/08/2021 0740   Lab Results  Component Value Date   HGBA1C 5.9 (H) 11/29/2024   HGBA1C 5.7 09/14/2011   Lab Results  Component Value Date   INSULIN  51.4 (H) 06/29/2023   INSULIN  27.9 (H) 05/17/2017   Lab Results  Component Value Date   TSH 1.140 06/29/2023   CBC    Component Value Date/Time   WBC 8.1 06/29/2023 0829   WBC 8.8 01/12/2022 0940   RBC 4.41 06/29/2023 0829   RBC 3.87 01/12/2022 0940   HGB 13.7 06/29/2023 0829   HCT 40.5 06/29/2023 0829   PLT 311 06/29/2023 0829   MCV 92 06/29/2023  0829   MCH 31.1 06/29/2023 0829   MCH 31.3 12/07/2021 0711   MCHC 33.8 06/29/2023 0829   MCHC 33.1 01/12/2022 0940   RDW 13.9 06/29/2023 0829   Iron Studies    Component Value Date/Time   IRON 77 01/12/2022 0940   FERRITIN 131.2 01/12/2022 0940   Lipid Panel     Component Value Date/Time   CHOL 159 04/12/2024 1157   CHOL 204 (H) 06/29/2023 0829   TRIG 56.0 04/12/2024 1157   HDL 73.20 04/12/2024 1157   HDL 82 06/29/2023 0829   CHOLHDL 2 04/12/2024 1157   VLDL 11.2 04/12/2024 1157   LDLCALC 74 04/12/2024 1157   LDLCALC 108 (H) 06/29/2023 0829   LDLCALC 127 (H) 04/28/2019 1457   Hepatic Function Panel     Component Value  Date/Time   PROT 6.7 11/29/2024 0956   PROT 6.7 06/29/2023 0829   ALBUMIN  4.1 06/29/2023 0829   AST 13 11/29/2024 0956   ALT 18 11/29/2024 0956   ALKPHOS 142 (H) 06/29/2023 0829   BILITOT 0.5 11/29/2024 0956   BILITOT <0.2 06/29/2023 0829   BILIDIR 0.1 01/18/2023 0739   IBILI 0.3 04/28/2019 1457      Component Value Date/Time   TSH 1.140 06/29/2023 0829   Nutritional Lab Results  Component Value Date   VD25OH 49.12 04/12/2024   VD25OH 19.31 (L) 10/29/2023   VD25OH 24.4 (L) 06/29/2023     Assessment and Plan Assessment & Plan Obesity Management is ongoing with a low-carb diet, which she follows about 50% of the time. She is working on adequate hydration, protein intake, and increasing fruits and vegetables. She has gained four pounds since the last visit, likely due to recent illness affecting her eating habits. She is walking for exercise 30 minutes, seven days a week. There is a delay in obtaining Wegovy  from the pharmacy, but she is currently on Mounjaro  5 mg weekly. Transitioning from Mounjaro  to Wegovy  requires careful timing to avoid concurrent use, as both are in the same category. Mounjaro  remains in the system for four weeks, with the strongest effect in the first week. The transition plan involves starting Wegovy  the following week after the last Mounjaro  dose to account for residual effects. - Continue low-carb diet and exercise regimen. - Await Wegovy  availability from pharmacy. - Transition from Mounjaro  to Wegovy  by starting Wegovy  the following week after the last Mounjaro  dose. - Scheduled follow-up in four weeks.  Type 2 diabetes mellitus Managed with Mounjaro  5 mg weekly. Recent lab work shows improvement in A1c from 6.2 to 5.9. She is mindful of sugar intake and has not had any cavities recently, indicating good control of blood sugar levels. She is working on reducing sweets and maintaining a healthy diet. - Continue Mounjaro  5 mg weekly until changing to  Wegovy  - Monitor blood sugar levels regularly. - Maintain a healthy diet with reduced sugar intake.      Patients who are on anti-obesity medications are counseled on the importance of maintaining healthy lifestyle habits, including balanced nutrition, regular physical activity, and behavioral modifications,  Medication is an adjunct to, not a replacement for, lifestyle changes and that the long-term success and weight maintenance depend on continued adherence to these strategies.   Yvonne [name] was informed of the importance of frequent follow up visits to maximize her success with intensive lifestyle modifications for her obesity and obesity related health conditions as recommended by USPSTF and CMS guidelines  Louann Penton, MD   "

## 2024-12-19 NOTE — Telephone Encounter (Signed)
 Please send it in to [facility] x 1 month

## 2024-12-19 NOTE — Addendum Note (Signed)
 Addended by: LAFE BAKER CROME on: 12/19/2024 04:40 PM   Modules accepted: Orders

## 2025-01-15 ENCOUNTER — Ambulatory Visit (INDEPENDENT_AMBULATORY_CARE_PROVIDER_SITE_OTHER): Admitting: Family Medicine

## 2025-01-16 ENCOUNTER — Ambulatory Visit (INDEPENDENT_AMBULATORY_CARE_PROVIDER_SITE_OTHER): Admitting: Family Medicine

## 2025-02-12 ENCOUNTER — Ambulatory Visit (INDEPENDENT_AMBULATORY_CARE_PROVIDER_SITE_OTHER): Admitting: Family Medicine

## 2025-04-04 ENCOUNTER — Ambulatory Visit: Admitting: Family

## 2025-05-24 ENCOUNTER — Ambulatory Visit: Admitting: Dermatology
